# Patient Record
Sex: Male | Born: 1965
Health system: Southern US, Community
[De-identification: ages and names within clinical notes are randomized; demographics above are authoritative.]

## PROBLEM LIST (undated history)

## (undated) DIAGNOSIS — I255 Ischemic cardiomyopathy: Secondary | ICD-10-CM

## (undated) DIAGNOSIS — I10 Essential (primary) hypertension: Secondary | ICD-10-CM

## (undated) DIAGNOSIS — K219 Gastro-esophageal reflux disease without esophagitis: Secondary | ICD-10-CM

## (undated) DIAGNOSIS — R001 Bradycardia, unspecified: Secondary | ICD-10-CM

## (undated) DIAGNOSIS — F319 Bipolar disorder, unspecified: Secondary | ICD-10-CM

## (undated) DIAGNOSIS — J45909 Unspecified asthma, uncomplicated: Secondary | ICD-10-CM

## (undated) DIAGNOSIS — E782 Mixed hyperlipidemia: Secondary | ICD-10-CM

## (undated) DIAGNOSIS — G8929 Other chronic pain: Secondary | ICD-10-CM

## (undated) DIAGNOSIS — M549 Dorsalgia, unspecified: Secondary | ICD-10-CM

## (undated) DIAGNOSIS — I2510 Atherosclerotic heart disease of native coronary artery without angina pectoris: Secondary | ICD-10-CM

## (undated) DIAGNOSIS — E119 Type 2 diabetes mellitus without complications: Secondary | ICD-10-CM

## (undated) HISTORY — DX: Atherosclerotic heart disease of native coronary artery without angina pectoris: I25.10

## (undated) HISTORY — PX: CORONARY ARTERY BYPASS GRAFT: SHX141

## (undated) HISTORY — DX: Type 2 diabetes mellitus without complications: E11.9

## (undated) HISTORY — DX: Mixed hyperlipidemia: E78.2

## (undated) HISTORY — DX: Bipolar disorder, unspecified: F31.9

## (undated) HISTORY — DX: Other chronic pain: G89.29

## (undated) HISTORY — DX: Gastro-esophageal reflux disease without esophagitis: K21.9

## (undated) HISTORY — PX: CHOLECYSTECTOMY: SHX55

## (undated) HISTORY — DX: Essential (primary) hypertension: I10

## (undated) HISTORY — DX: Dorsalgia, unspecified: M54.9

## (undated) HISTORY — PX: OTHER SURGICAL HISTORY: SHX169

---

## 2002-01-21 ENCOUNTER — Emergency Department (HOSPITAL_COMMUNITY): Admission: EM | Admit: 2002-01-21 | Discharge: 2002-01-21 | Payer: Self-pay | Admitting: Emergency Medicine

## 2002-04-17 ENCOUNTER — Emergency Department (HOSPITAL_COMMUNITY): Admission: EM | Admit: 2002-04-17 | Discharge: 2002-04-17 | Payer: Self-pay | Admitting: Emergency Medicine

## 2002-08-15 ENCOUNTER — Encounter: Payer: Self-pay | Admitting: Thoracic Surgery (Cardiothoracic Vascular Surgery)

## 2002-08-15 ENCOUNTER — Inpatient Hospital Stay (HOSPITAL_COMMUNITY): Admission: EM | Admit: 2002-08-15 | Discharge: 2002-08-19 | Payer: Self-pay | Admitting: Cardiology

## 2002-08-16 ENCOUNTER — Encounter: Payer: Self-pay | Admitting: Thoracic Surgery (Cardiothoracic Vascular Surgery)

## 2002-08-17 ENCOUNTER — Encounter: Payer: Self-pay | Admitting: Thoracic Surgery (Cardiothoracic Vascular Surgery)

## 2003-02-13 ENCOUNTER — Inpatient Hospital Stay (HOSPITAL_COMMUNITY): Admission: EM | Admit: 2003-02-13 | Discharge: 2003-02-16 | Payer: Self-pay | Admitting: Cardiology

## 2003-02-15 ENCOUNTER — Encounter: Payer: Self-pay | Admitting: Cardiology

## 2003-11-16 ENCOUNTER — Emergency Department (HOSPITAL_COMMUNITY): Admission: EM | Admit: 2003-11-16 | Discharge: 2003-11-16 | Payer: Self-pay | Admitting: Emergency Medicine

## 2004-02-07 ENCOUNTER — Emergency Department (HOSPITAL_COMMUNITY): Admission: EM | Admit: 2004-02-07 | Discharge: 2004-02-07 | Payer: Self-pay | Admitting: Emergency Medicine

## 2004-04-18 ENCOUNTER — Inpatient Hospital Stay (HOSPITAL_BASED_OUTPATIENT_CLINIC_OR_DEPARTMENT_OTHER): Admission: RE | Admit: 2004-04-18 | Discharge: 2004-04-18 | Payer: Self-pay | Admitting: Cardiovascular Disease

## 2004-11-03 ENCOUNTER — Ambulatory Visit: Payer: Self-pay | Admitting: Cardiology

## 2005-05-13 ENCOUNTER — Emergency Department (HOSPITAL_COMMUNITY): Admission: EM | Admit: 2005-05-13 | Discharge: 2005-05-14 | Payer: Self-pay | Admitting: *Deleted

## 2005-08-21 ENCOUNTER — Ambulatory Visit: Payer: Self-pay | Admitting: Cardiology

## 2005-09-20 ENCOUNTER — Ambulatory Visit: Payer: Self-pay | Admitting: Cardiology

## 2006-01-09 ENCOUNTER — Ambulatory Visit: Payer: Self-pay | Admitting: Cardiology

## 2006-01-09 ENCOUNTER — Observation Stay (HOSPITAL_COMMUNITY): Admission: EM | Admit: 2006-01-09 | Discharge: 2006-01-09 | Payer: Self-pay | Admitting: Cardiology

## 2006-01-19 ENCOUNTER — Ambulatory Visit: Payer: Self-pay | Admitting: Cardiology

## 2006-01-22 ENCOUNTER — Ambulatory Visit: Payer: Self-pay | Admitting: Cardiology

## 2008-05-19 ENCOUNTER — Ambulatory Visit: Payer: Self-pay | Admitting: Cardiology

## 2008-05-20 ENCOUNTER — Ambulatory Visit: Payer: Self-pay | Admitting: Cardiology

## 2008-05-21 ENCOUNTER — Ambulatory Visit: Payer: Self-pay | Admitting: Cardiovascular Disease

## 2008-05-21 ENCOUNTER — Ambulatory Visit (HOSPITAL_COMMUNITY): Admission: RE | Admit: 2008-05-21 | Discharge: 2008-05-21 | Payer: Self-pay | Admitting: Cardiovascular Disease

## 2008-08-04 ENCOUNTER — Emergency Department (HOSPITAL_COMMUNITY): Admission: EM | Admit: 2008-08-04 | Discharge: 2008-08-04 | Payer: Self-pay | Admitting: Emergency Medicine

## 2008-08-31 ENCOUNTER — Encounter: Payer: Self-pay | Admitting: Cardiology

## 2008-10-31 ENCOUNTER — Encounter: Payer: Self-pay | Admitting: Cardiology

## 2008-11-01 ENCOUNTER — Encounter: Payer: Self-pay | Admitting: Cardiology

## 2008-11-20 ENCOUNTER — Encounter: Payer: Self-pay | Admitting: Cardiology

## 2009-01-06 ENCOUNTER — Encounter: Payer: Self-pay | Admitting: Cardiology

## 2009-03-15 ENCOUNTER — Telehealth: Payer: Self-pay | Admitting: Cardiology

## 2009-03-18 ENCOUNTER — Inpatient Hospital Stay (HOSPITAL_COMMUNITY): Admission: AD | Admit: 2009-03-18 | Discharge: 2009-03-20 | Payer: Self-pay | Admitting: Internal Medicine

## 2009-03-18 ENCOUNTER — Ambulatory Visit: Payer: Self-pay | Admitting: Cardiovascular Disease

## 2009-03-18 ENCOUNTER — Ambulatory Visit: Payer: Self-pay | Admitting: Cardiology

## 2009-03-19 ENCOUNTER — Encounter: Payer: Self-pay | Admitting: Cardiology

## 2009-04-07 ENCOUNTER — Ambulatory Visit: Payer: Self-pay | Admitting: Cardiology

## 2009-05-31 ENCOUNTER — Encounter: Payer: Self-pay | Admitting: Cardiology

## 2009-06-03 ENCOUNTER — Encounter: Payer: Self-pay | Admitting: Cardiology

## 2009-06-17 DIAGNOSIS — E782 Mixed hyperlipidemia: Secondary | ICD-10-CM

## 2009-06-28 ENCOUNTER — Encounter: Payer: Self-pay | Admitting: Cardiology

## 2009-07-29 ENCOUNTER — Ambulatory Visit: Payer: Self-pay | Admitting: Cardiology

## 2009-07-29 ENCOUNTER — Encounter: Payer: Self-pay | Admitting: Cardiology

## 2009-07-30 ENCOUNTER — Encounter: Payer: Self-pay | Admitting: Cardiology

## 2009-08-10 ENCOUNTER — Ambulatory Visit: Payer: Self-pay | Admitting: Cardiology

## 2009-08-10 DIAGNOSIS — F17200 Nicotine dependence, unspecified, uncomplicated: Secondary | ICD-10-CM

## 2009-08-13 ENCOUNTER — Emergency Department (HOSPITAL_COMMUNITY): Admission: EM | Admit: 2009-08-13 | Discharge: 2009-08-13 | Payer: Self-pay | Admitting: Emergency Medicine

## 2009-08-25 ENCOUNTER — Telehealth (INDEPENDENT_AMBULATORY_CARE_PROVIDER_SITE_OTHER): Payer: Self-pay | Admitting: *Deleted

## 2009-11-16 ENCOUNTER — Telehealth (INDEPENDENT_AMBULATORY_CARE_PROVIDER_SITE_OTHER): Payer: Self-pay | Admitting: *Deleted

## 2009-11-17 ENCOUNTER — Ambulatory Visit: Payer: Self-pay | Admitting: Cardiology

## 2009-11-22 ENCOUNTER — Encounter (INDEPENDENT_AMBULATORY_CARE_PROVIDER_SITE_OTHER): Payer: Self-pay | Admitting: *Deleted

## 2009-11-22 ENCOUNTER — Encounter: Payer: Self-pay | Admitting: Cardiology

## 2009-11-22 ENCOUNTER — Telehealth (INDEPENDENT_AMBULATORY_CARE_PROVIDER_SITE_OTHER): Payer: Self-pay | Admitting: *Deleted

## 2009-11-23 ENCOUNTER — Encounter: Payer: Self-pay | Admitting: Cardiology

## 2009-11-23 ENCOUNTER — Encounter (INDEPENDENT_AMBULATORY_CARE_PROVIDER_SITE_OTHER): Payer: Self-pay | Admitting: *Deleted

## 2009-11-24 ENCOUNTER — Ambulatory Visit: Payer: Self-pay | Admitting: Cardiovascular Disease

## 2009-11-24 ENCOUNTER — Inpatient Hospital Stay (HOSPITAL_COMMUNITY): Admission: RE | Admit: 2009-11-24 | Discharge: 2009-11-25 | Payer: Self-pay | Admitting: Cardiovascular Disease

## 2009-11-24 ENCOUNTER — Inpatient Hospital Stay (HOSPITAL_BASED_OUTPATIENT_CLINIC_OR_DEPARTMENT_OTHER): Admission: RE | Admit: 2009-11-24 | Discharge: 2009-11-24 | Payer: Self-pay | Admitting: Cardiovascular Disease

## 2009-11-25 ENCOUNTER — Encounter: Payer: Self-pay | Admitting: Cardiovascular Disease

## 2009-12-15 ENCOUNTER — Ambulatory Visit: Payer: Self-pay | Admitting: Cardiology

## 2009-12-21 ENCOUNTER — Ambulatory Visit: Payer: Self-pay | Admitting: Cardiology

## 2009-12-22 ENCOUNTER — Encounter: Payer: Self-pay | Admitting: Cardiology

## 2010-01-24 ENCOUNTER — Encounter: Payer: Self-pay | Admitting: Cardiology

## 2010-01-27 ENCOUNTER — Telehealth (INDEPENDENT_AMBULATORY_CARE_PROVIDER_SITE_OTHER): Payer: Self-pay | Admitting: *Deleted

## 2010-01-27 ENCOUNTER — Encounter: Payer: Self-pay | Admitting: Cardiology

## 2010-01-28 ENCOUNTER — Encounter: Payer: Self-pay | Admitting: Cardiology

## 2010-01-28 ENCOUNTER — Inpatient Hospital Stay (HOSPITAL_COMMUNITY): Admission: EM | Admit: 2010-01-28 | Discharge: 2010-01-30 | Payer: Self-pay | Admitting: Cardiology

## 2010-01-28 ENCOUNTER — Ambulatory Visit: Payer: Self-pay | Admitting: Cardiology

## 2010-01-29 ENCOUNTER — Encounter: Payer: Self-pay | Admitting: Cardiology

## 2010-02-16 ENCOUNTER — Ambulatory Visit: Payer: Self-pay | Admitting: Cardiology

## 2010-02-16 DIAGNOSIS — R9431 Abnormal electrocardiogram [ECG] [EKG]: Secondary | ICD-10-CM

## 2010-04-07 ENCOUNTER — Telehealth (INDEPENDENT_AMBULATORY_CARE_PROVIDER_SITE_OTHER): Payer: Self-pay | Admitting: *Deleted

## 2010-04-11 ENCOUNTER — Inpatient Hospital Stay (HOSPITAL_COMMUNITY): Admission: EM | Admit: 2010-04-11 | Discharge: 2010-04-13 | Payer: Self-pay | Admitting: Internal Medicine

## 2010-04-11 ENCOUNTER — Ambulatory Visit: Payer: Self-pay | Admitting: Internal Medicine

## 2010-04-12 ENCOUNTER — Encounter: Payer: Self-pay | Admitting: Cardiology

## 2010-04-13 ENCOUNTER — Encounter: Payer: Self-pay | Admitting: Cardiology

## 2010-04-20 ENCOUNTER — Emergency Department (HOSPITAL_COMMUNITY): Admission: EM | Admit: 2010-04-20 | Discharge: 2010-04-20 | Payer: Self-pay | Admitting: Emergency Medicine

## 2010-04-28 ENCOUNTER — Encounter: Payer: Self-pay | Admitting: Physician Assistant

## 2010-04-28 ENCOUNTER — Ambulatory Visit: Payer: Self-pay | Admitting: Cardiology

## 2010-06-17 ENCOUNTER — Telehealth (INDEPENDENT_AMBULATORY_CARE_PROVIDER_SITE_OTHER): Payer: Self-pay | Admitting: *Deleted

## 2010-08-11 ENCOUNTER — Ambulatory Visit: Payer: Self-pay | Admitting: Cardiology

## 2010-08-26 ENCOUNTER — Encounter: Payer: Self-pay | Admitting: Cardiology

## 2010-08-29 ENCOUNTER — Encounter: Payer: Self-pay | Admitting: Cardiology

## 2010-08-30 ENCOUNTER — Telehealth (INDEPENDENT_AMBULATORY_CARE_PROVIDER_SITE_OTHER): Payer: Self-pay | Admitting: *Deleted

## 2010-11-08 NOTE — Progress Notes (Signed)
Summary: PLEASE CALL/? R/E EFFIENT   Phone Note Call from Patient Call back at Home Phone [phone]   Caller: Patient Call For: Nurse Summary of Call: patient called concerned that since he is now getting his [facility] that there may be a problem with insurance covering the medication and if so,should he switch back to plavix. Nurse advised patient that if there is a problem with insurance covering medications that they should contact us directly so we can address the problem. Patient verbalized understanding of plan. Initial call taken by: Carlye Grippe,  June 17, 2010 11:46 AM

## 2010-11-08 NOTE — Assessment & Plan Note (Signed)
Summary: EPH-POST PCI [facility]E   Primary Provider:  Dr. Linna Darner   History of Present Illness: the patient is a 45 year old male with a history of numbness dilatation microinfarction recently. He has extensive coronary disease and multiple prior PCI procedures for coronary bypass surgery in 2003. The patient continues to smoke. He presented back with typical unstable angina and ruled in for non-ST elevation myocardial infarction. Since he only underwent cardiac catheterization and was found to have severe recurrent stenosis of the saphenous vein graft to the right coronary artery. The patient had successful percutaneous greater patient using 2 separate drug-eluting stents. The patient did have severe 2 vessel native coronary disease, but patent LIMA graft to the LAD and saphenous vein graft sequential fashion to the diagonal marginal branch. He has normal LV function. The patient was changed to prasugrel based on P2Y12 platelet inhibition testing.  The patient denies intercurrent chest pain. He denies any orthopnea PND. He has no complications related to his cardiac catheterization. He has shown interest to discontinue smoking.  Preventive Screening-Counseling & Management  Alcohol-Tobacco     Smoking Status: current  Comments: trying to quit, has questions about electronic cig - was told it had antifreeze in it ???  Current Medications (verified): 1)  Protonix 40 Mg Solr (Pantoprazole Sodium) .... Take 1 Tablet By Mouth Once A Day. Obtain Further Refills From Primary Md 2)  Lisinopril 2.5 Mg Tabs (Lisinopril) .... Take One Tablet By Mouth Daily 3)  Simvastatin 40 Mg Tabs (Simvastatin) .... Take One Tablet By Mouth Daily At Bedtime 4)  Metoprolol Succinate 50 Mg Xr24h-Tab (Metoprolol Succinate) .... Take 1 Tablet By Mouth Once A Day 5)  Aspirin Ec 325 Mg Tbec (Aspirin) .... Take One Tablet By Mouth Daily 6)  Diazepam 5 Mg Tabs (Diazepam) .... Take 1 Tablet By Mouth Two Times A Day 7)  Nitrostat  0.6 Mg Subl (Nitroglycerin) .... Use As Directed 8)  Isosorbide Mononitrate Cr 60 Mg Xr24h-Tab (Isosorbide Mononitrate) .... Take 1 1/2 Tabs (90mg ) Daily (Place On File) 9)  Ranexa 500 Mg Xr12h-Tab (Ranolazine) .... Take 1 Tablet By Mouth Two Times A Day 10)  Effient 10 Mg Tabs (Prasugrel Hcl) .... Take 1 Tablet By Mouth Once A Day  Allergies (verified): No Known Drug Allergies   Past History:  Past Medical History: Last updated: 08/10/2009 CAD, NATIVE VESSEL (ICD-414.01)  (severe native vessel disease with LAD occlusion, circumflex occlusion, and right coronary occlusion.  A LIMA to the LAD was patent. Although there was diffuse nonobstructive disease distal to the anastomosis.  The saphenous vein graft to the first diagonal ramus intermediate an OM was patent.  The saphenous vein graft to the distal right coronary artery was patent with an 80% defect in the mid segment and a 95% distal stenosis within the graft.  The EF was 55%.  The patient subsequently had 2 bare-metal stents placed to the saphenous vein graft.) HYPERLIPIDEMIA-MIXED (ICD-272.4) COPD Bipolar disorder GERD Gout  Past Surgical History: Last updated: 06/17/2009 Cholecystectomy  Family History: Last updated: 06/17/2009 Family History of CVA or Stroke:   Social History: Last updated: 06/17/2009 Married  Tobacco Use - Yes.  Alcohol Use - yes Drug Use - yes - still uses marijuana but has DC cocaine use  Risk Factors: Smoking Status: current (02/16/2010) Packs/Day: 1/2 PPD (12/15/2009)  Review of Systems  The patient denies fatigue, malaise, fever, weight gain/loss, vision loss, decreased hearing, hoarseness, chest pain, palpitations, shortness of breath, prolonged cough, wheezing, sleep apnea, coughing up blood,  abdominal pain, blood in stool, nausea, vomiting, diarrhea, heartburn, incontinence, blood in urine, muscle weakness, joint pain, leg swelling, rash, skin lesions, headache, fainting, dizziness,  depression, anxiety, enlarged lymph nodes, easy bruising or bleeding, and environmental allergies.    Vital Signs:  Patient profile:   45 year old male Height:      63 inches Weight:      227.25 pounds Pulse rate:   57 / minute BP sitting:   123 / 77  (left arm)  Vitals Entered By: Hoover Brunette, LPN (Feb 16, 2010 10:24 AM) Is Patient Diabetic? No   Physical Exam  Additional Exam:  General: Well-developed, well-nourished in no distress head: Normocephalic and atraumatic eyes PERRLA/EOMI intact, conjunctiva and lids normal nose: No deformity or lesions mouth normal dentition, normal posterior pharynx neck: Supple, no JVD.  No masses, thyromegaly or abnormal cervical nodes lungs: Normal breath sounds bilaterally without wheezing.  Normal percussion heart: regular rate and rhythm with normal S1 and S2, no S3 or S4.  PMI is normal.  No pathological murmurs abdomen: Normal bowel sounds, abdomen is soft and nontender without masses, organomegaly or hernias noted.  No hepatosplenomegaly musculoskeletal: Back normal, normal gait muscle strength and tone normal pulsus: Pulse is normal in all 4 extremities Extremities: No peripheral pitting edema neurologic: Alert and oriented x 3 skin: Intact without lesions or rashes cervical nodes: No significant adenopathy psychologic: Normal affect    Cardiac Cath  Procedure date:  01/28/2010  Findings:      PROCEDURAL FINDINGS:  Aortic pressure 118/56, left ventricular pressure 118/21.   Left ventriculography is normal.  The LVEF is estimated at 60%.  There is no mitral regurgitation.   ASSESSMENT: 1. Severe recurrent stenosis of the saphenous vein graft to right     coronary artery with successful percutaneous intervention using 2     separate drug-eluting stents. 2. Severe three-vessel native coronary artery disease. 3. Continued patency of left internal mammary artery to left anterior     descending and saphenous vein graft sequenced  to diagonal and     obtuse marginal branches. 4. Normal left ventricular function.   PLAN:  The patient needs to continue on long-term dual antiplatelet therapy and with aspirin and Plavix.  He now has multiple drug-eluting stents in the bypass graft to the distal right coronary artery. Recommend P2Y12 platelet testing and change to Effient i  EKG  Procedure date:  02/16/2010  Findings:      normal sinus rhythm no acute ischemic changes  Impression & Recommendations:  Problem # 1:  CAD, NATIVE VESSEL (ICD-414.01) the patient has had recurrent disease in the saphenous vein graft. He underwent drug-eluting stent placement and was changed to prasugrel. This will be continued lifelong. The following medications were removed from the medication list:    Plavix 75 Mg Tabs (Clopidogrel bisulfate) .Marland Kitchen... Take one tablet by mouth daily His updated medication list for this problem includes:    Lisinopril 2.5 Mg Tabs (Lisinopril) .Marland Kitchen... Take one tablet by mouth daily    Metoprolol Succinate 50 Mg Xr24h-tab (Metoprolol succinate) .Marland Kitchen... Take 1 tablet by mouth once a day    Aspirin Ec 325 Mg Tbec (Aspirin) .Marland Kitchen... Take one tablet by mouth daily    Nitrostat 0.6 Mg Subl (Nitroglycerin) ..... Use as directed    Isosorbide Mononitrate Cr 60 Mg Xr24h-tab (Isosorbide mononitrate) .Marland Kitchen... Take 1 1/2 tabs (90mg ) daily (place on file)    Ranexa 500 Mg Xr12h-tab (Ranolazine) .Marland Kitchen... Take 1 tablet by mouth  two times a day    Effient 10 Mg Tabs (Prasugrel hcl) .Marland Kitchen... Take 1 tablet by mouth once a day  Problem # 2:  HYPERLIPIDEMIA-MIXED (ICD-272.4) continual statin drug therapy. His updated medication list for this problem includes:    Simvastatin 40 Mg Tabs (Simvastatin) .Marland Kitchen... Take one tablet by mouth daily at bedtime  Problem # 3:  TOBACCO USER (ICD-305.1) the patient was again counseled about his tobacco use. We will considerChantix if not successful in discontinuation  Problem # 4:  NONSPECIFIC ABNORMAL  ELECTROCARDIOGRAM (ICD-794.31)  we will obtain a 12-lead electrocardiogram to make sure that there is no QT prolongation on Ranexa. The following medications were removed from the medication list:    Plavix 75 Mg Tabs (Clopidogrel bisulfate) .Marland Kitchen... Take one tablet by mouth daily His updated medication list for this problem includes:    Lisinopril 2.5 Mg Tabs (Lisinopril) .Marland Kitchen... Take one tablet by mouth daily    Metoprolol Succinate 50 Mg Xr24h-tab (Metoprolol succinate) .Marland Kitchen... Take 1 tablet by mouth once a day    Aspirin Ec 325 Mg Tbec (Aspirin) .Marland Kitchen... Take one tablet by mouth daily    Nitrostat 0.6 Mg Subl (Nitroglycerin) ..... Use as directed    Isosorbide Mononitrate Cr 60 Mg Xr24h-tab (Isosorbide mononitrate) .Marland Kitchen... Take 1 1/2 tabs (90mg ) daily (place on file)    Ranexa 500 Mg Xr12h-tab (Ranolazine) .Marland Kitchen... Take 1 tablet by mouth two times a day    Effient 10 Mg Tabs (Prasugrel hcl) .Marland Kitchen... Take 1 tablet by mouth once a day  Orders: EKG w/ Interpretation (93000)  Patient Instructions: 1)  Your physician recommends that you continue on your current medications as directed. Please refer to the Current Medication list given to you today. 2)  Follow up in  6 months

## 2010-11-08 NOTE — Progress Notes (Signed)
Summary: chest discomfort   Phone Note Call from Patient Call back at cell:  [phone]   Summary of Call: States he is having tightness in chest, discomfort over last 3-4 days.  Does notice mostly with exertion.  Has had to take as many has 3-4 in one day thru out the day, like poss. over 5-6 hours.  No symptoms with just sitting.  Not due for follow up till November.   Hoover Brunette, LPN  April 07, 2010 3:52 PM   Follow-up for Phone Call        Is he taking his pasurgrel. Is the pain going away with one NTG? Should probably got to ER if pain longer then 10-15 min.  Follow-up by: Lewayne Bunting, MD, [facility],  April 07, 2010 4:15 PM  Additional Follow-up for Phone Call Additional follow up Details #1::        Patient notified.   States he is taking Pasurgrel and his pain usually does go away within 10-15 minutes.  Advised him to go to ED if last longer.  Patient verbalized understanding.  Additional Follow-up by: Hoover Brunette, LPN,  April 07, 2010 4:28 PM

## 2010-11-08 NOTE — Progress Notes (Signed)
Summary: chest pain   Phone Note Call from Patient   Summary of Call: Home:  [phone] - please call back at this number.  C/O chest pain x last 4-5 days.  Had to take 4 NTG tabs on 4/17.  This morning took one around 4:30 this a.m. and then took another around 12.  Rated around 7/10 at that time, then felt better after few minutes.  Now has no chest pain at all, but states he feels funny feeling in left elbow like elbow locking up.  Did just have stents approx. 3 months ago.  Advised to go to ER for evaluation if symptoms worsen before call back from our office here.  Patient verbalized understanding.  Initial call taken by: Hoover Brunette, LPN,  January 27, 2010 12:48 PM  Follow-up for Phone Call        Patient should go to ER.  Follow-up by: Lewayne Bunting, MD, [facility],  January 27, 2010 1:40 PM  Additional Follow-up for Phone Call Additional follow up Details #1::        Patient notified.    Hoover Brunette, LPN  January 27, 2010 2:11 PM

## 2010-11-08 NOTE — Progress Notes (Signed)
Summary: Continued Chest Tightness  Phone Note Call from Patient Call back at Home Phone [phone] Call back at [phone]   Caller: Patient Summary of Call: Pt called stating he was seen on 2/9 d/t chest tightness w/exertion. He was told to start Imdur 60mg  1/2 tablet x 3 days then increase to 1 tablet daily. He was told to come to office on 2/16 to discuss pain and possible need for cath. Pt states since Wednesday's appt, he has had 14 episodes of exertional chest pain. He describes this as tightness in his chest and discomfort in his (L) elbow. It is usually relieved with 1 NTG and is always with some type of exertion. He states this am he had tightness when he got out of the shower. He states throughout the day on 2/11 he took NTG x2, 2/12 NTG x2, 2/13 NTG x2-3, and 1 NTG today. He has taken his Imdur as instructed. Pt is notified to go to the ER for any worsening pain or problems or pain/tightness not releived by NTG. Pt verbalized understanding. He asked that he be called at home number which is [phone]. Initial call taken by: Cyril Loosen, RN, BSN,  November 22, 2009 2:17 PM  Follow-up for Phone Call        Discussed with Dr. Earnestine Leys who gave v.o. to increase Imdur to 90mg  once daily and schedule JV cath. Pt scheduled for Wed at 9:30 am. Pt is aware to go to [facility] in am to have pre-cath labs/cxr done and then come to office for instructions.  Follow-up by: Cyril Loosen, RN, BSN,  November 22, 2009 2:57 PM    New/Updated Medications: ISOSORBIDE MONONITRATE CR 60 MG XR24H-TAB (ISOSORBIDE MONONITRATE) Take 1 1/2 tablet by mouth once daily. Prescriptions: ISOSORBIDE MONONITRATE CR 60 MG XR24H-TAB (ISOSORBIDE MONONITRATE) Take 1 1/2 tablet by mouth once daily.  #45 x 6   Entered by:   Cyril Loosen, RN, BSN   Authorized by:   Lewayne Bunting, MD, [facility]   Signed by:   Cyril Loosen, RN, BSN on 11/22/2009   Method used:   Electronically to        [facility] [address] (retail)       [address]       [place], [place]  [address]       Ph: [phone] or [phone]       Fax: [phone]   RxID:   [id number]   Appended Document: Continued Chest Tightness Discussed plan would Cyril Loosen RN. Patient will be notified to proceed with cardiac catheterization. I discussed risks and benefits of the procedure with the patient in the office during his last visit. We will schedule him in the JV lab.the patient was instructed to come to the emergency room if he has resting chest pain.

## 2010-11-08 NOTE — Progress Notes (Signed)
Summary: chest pain  Phone Note Call from Patient   Summary of Call: Pt. c/o chest pain with exertion x last 2-3 days.  States pain is relieved with taking Ntg. x 1 after episode which relieved the pain.  Advised pt. to go immediatelly to ER this p.m. if symtoms occur and/or worsen.  Appt. scheduled for 2/9 at 9:15 with GD.  Patient verbalized understanding.  Initial call taken by: Hoover Brunette, LPN,  November 16, 2009 4:14 PM

## 2010-11-08 NOTE — Assessment & Plan Note (Signed)
Summary: 3 MO FU PER OCT REMINDER-SRS   Visit Type:  Follow-up Primary Provider:  Dr. Linna Darner   History of Present Illness: He recently presented to [facility] with non-ST elevation MI, status post CABG in 2003. Most recently, he underwent drug-eluting stenting of the SVG-RCA graft in April, secondary to severe recurrent stenosis. He had otherwise patent LIMA graft and SVG-diagonal marginal branch graft, and normal LVEF. Marland KitchenHe is followed here in our clinic by Dr. Andee Lineman.  During this presentation, he was found to have progressive disease of the SVG-RCA graft, with a new lesion involving the native vessel. He was treated successfully with a drug-eluting stent through the previously placed stent, with no noted complications. There was continued patency of both the LIMA-LAD and SVG-first diagonal-ramus-obtuse marginal branch grafts. Mild LVD (EF 45-50%), with inferobasal HA/AK.   The patient stated he still takes occasional nitroglycerin. Overall he's been feeling better however. He is able to mow his lawn. He also stopped using marijuana.  Preventive Screening-Counseling & Management  Alcohol-Tobacco     Smoking Status: current     Smoking Cessation Counseling: yes     Packs/Day: 1 PPD  Current Medications (verified): 1)  Protonix 40 Mg Solr (Pantoprazole Sodium) .... Take 1 Tablet By Mouth Once A Day. Obtain Further Refills From Primary Md 2)  Lisinopril 2.5 Mg Tabs (Lisinopril) .... Take One Tablet By Mouth Daily 3)  Pravastatin Sodium 40 Mg Tabs (Pravastatin Sodium) .... Take 1 Tablet By Mouth Once A Day 4)  Metoprolol Succinate 50 Mg Xr24h-Tab (Metoprolol Succinate) .... Take 1 &1/2 Tablet By Mouth Once A Day 5)  Aspirin Ec 325 Mg Tbec (Aspirin) .... Take One Tablet By Mouth Daily 6)  Diazepam 5 Mg Tabs (Diazepam) .... Take 1 Tablet By Mouth Two Times A Day 7)  Nitrostat 0.4 Mg Subl (Nitroglycerin) .... Use As Directed Chest Pain Every 5 Min Up To 3 Doses. If No Relief,proceed To  Ed 8)  Isosorbide Mononitrate Cr 60 Mg Xr24h-Tab (Isosorbide Mononitrate) .... Take 1 1/2 Tabs Every Morning & 1/2 Tab Every Evening 9)  Ranexa 500 Mg Xr12h-Tab (Ranolazine) .... Take 1 Tablet By Mouth Two Times A Day 10)  Effient 10 Mg Tabs (Prasugrel Hcl) .... Take 1 Tablet By Mouth Once A Day  Allergies (verified): No Known Drug Allergies  Comments:  Nurse/Medical Assistant: The patient's medication bottles and allergies were reviewed with the patient and were updated in the Medication and Allergy Lists.  Past History:  Past Medical History: Last updated: 08/10/2009 CAD, NATIVE VESSEL (ICD-414.01)  (severe native vessel disease with LAD occlusion, circumflex occlusion, and right coronary occlusion.  A LIMA to the LAD was patent. Although there was diffuse nonobstructive disease distal to the anastomosis.  The saphenous vein graft to the first diagonal ramus intermediate an OM was patent.  The saphenous vein graft to the distal right coronary artery was patent with an 80% defect in the mid segment and a 95% distal stenosis within the graft.  The EF was 55%.  The patient subsequently had 2 bare-metal stents placed to the saphenous vein graft.) HYPERLIPIDEMIA-MIXED (ICD-272.4) COPD Bipolar disorder GERD Gout  Past Surgical History: Last updated: 06/17/2009 Cholecystectomy  Family History: Last updated: 06/17/2009 Family History of CVA or Stroke:   Social History: Last updated: 06/17/2009 Married  Tobacco Use - Yes.  Alcohol Use - yes Drug Use - yes - still uses marijuana but has DC cocaine use  Risk Factors: Smoking Status: current (08/11/2010) Packs/Day: 1 PPD (  08/11/2010)  Social History: Packs/Day:  1 PPD  Vital Signs:  Patient profile:   45 year old male Height:      63 inches Weight:      225 pounds BMI:     40.00 Pulse rate:   59 / minute BP sitting:   114 / 76  (left arm) Cuff size:   large  Vitals Entered By: Carlye Grippe (August 11, 2010  10:33 AM)  Nutrition Counseling: Patient's BMI is greater than 25 and therefore counseled on weight management options.  Physical Exam  Additional Exam:  GEN:45 year old male, obese, sitting upright, no distress HEENT: NCAT,PERRLA,EOMI NECK: palpable pulses, no bruits; no JVD; no TM LUNGS: CTA bilaterally HEART: RRR (S1S2); no significant murmurs; no rubs; no gallops ABD: soft, NT; intact BS QMV:HQIO groin stable with palpable pulse; no ecchymosis, hematoma, or bruit; no edema SKIN: warm, dry MUSC: no obvious deformity NEURO: A/O (x3)     Impression & Recommendations:  Problem # 1:  CAD, NATIVE VESSEL (ICD-414.01) details as outlined above. Given the fact that he still has chronic stable angina we will increase his isosorbide mononitrate to one and a half tablet p.o. q. daily His updated medication list for this problem includes:    Lisinopril 2.5 Mg Tabs (Lisinopril) .Marland Kitchen... Take one tablet by mouth daily    Metoprolol Succinate 50 Mg Xr24h-tab (Metoprolol succinate) .Marland Kitchen... Take 1 &1/2 tablet by mouth once a day    Aspirin Ec 325 Mg Tbec (Aspirin) .Marland Kitchen... Take one tablet by mouth daily    Nitrostat 0.4 Mg Subl (Nitroglycerin) ..... Use as directed chest pain every 5 min up to 3 doses. if no relief,proceed to ed    Isosorbide Mononitrate Cr 60 Mg Xr24h-tab (Isosorbide mononitrate) .Marland Kitchen... Take 1 1/2 tabs every morning & 1/2 tab every evening    Ranexa 500 Mg Xr12h-tab (Ranolazine) .Marland Kitchen... Take 1 tablet by mouth two times a day    Effient 10 Mg Tabs (Prasugrel hcl) .Marland Kitchen... Take 1 tablet by mouth once a day  Problem # 2:  HYPERLIPIDEMIA-MIXED (ICD-272.4) continue statin drug therapy His updated medication list for this problem includes:    Pravastatin Sodium 40 Mg Tabs (Pravastatin sodium) .Marland Kitchen... Take 1 tablet by mouth once a day  Problem # 3:  TOBACCO USER (ICD-305.1) the patient stop using marijuana.  Patient Instructions: 1)  Increase Imdur to 1 1/2 tabs every morning & 1/2 tab every  evening 2)  Follow up in  6 months Prescriptions: METOPROLOL SUCCINATE 50 MG XR24H-TAB (METOPROLOL SUCCINATE) Take 1 &1/2 tablet by mouth once a day  #135 x 3   Entered by:   Hoover Brunette, LPN   Authorized by:   Lewayne Bunting, MD, [facility]   Signed by:   Hoover Brunette, LPN on 96/29/5284   Method used:   Faxed to ...       [facility] (mail-order)       [address] [place], [place]  [address]       Ph: [phone]       Fax: [phone]   RxID:   [id number] EFFIENT 10 MG TABS (PRASUGREL HCL) Take 1 tablet by mouth once a day  #90 x 3   Entered by:   Hoover Brunette, LPN   Authorized by:   Lewayne Bunting, MD, [facility]   Signed by:   Hoover Brunette, LPN on 41/66/0630   Method used:   Faxed to .Marland KitchenMarland Kitchen  [facility] (mail-order)       [address] [place], [place]  [address]       Ph: [phone]       Fax: [phone]   RxID:   [id number] LISINOPRIL 2.5 MG TABS (LISINOPRIL) Take one tablet by mouth daily  #90 x 3   Entered by:   Hoover Brunette, LPN   Authorized by:   Lewayne Bunting, MD, [facility]   Signed by:   Hoover Brunette, LPN on 84/13/2440   Method used:   Faxed to ...       [facility] (mail-order)       [address] [place], [place]  [address]       Ph: [phone]       Fax: [phone]   RxID:   [id number] ISOSORBIDE MONONITRATE CR 60 MG XR24H-TAB (ISOSORBIDE MONONITRATE) take 1 1/2 tabs every morning & 1/2 tab every evening  #180 x 3   Entered by:   Hoover Brunette, LPN   Authorized by:   Lewayne Bunting, MD, [facility]   Signed by:   Hoover Brunette, LPN on 30/16/0109   Method used:   Faxed to ...       [facility] (mail-order)       [address] [place], [place]  [address]       Ph: [phone]       Fax: [phone]   RxID:   [id number]

## 2010-11-08 NOTE — Assessment & Plan Note (Signed)
Summary: EPH-POST [facility] POST MI   Visit Type:  hospital follow-up Primary Provider:  Dr. Linna Darner   History of Present Illness: patient presents for post hospital followup.  He recently presented to [facility] with non-ST elevation MI, status post CABG in 2003. Most recently, he underwent drug-eluting stenting of the SVG-RCA graft in April, secondary to severe recurrent stenosis. He had otherwise patent LIMA graft and SVG-diagonal marginal branch graft, and normal LVEF. Marland KitchenHe is followed here in our clinic by Dr. Andee Lineman.  During this presentation, he was found to have progressive disease of the SVG-RCA graft, with a new lesion involving the native vessel. He was treated successfully with a drug-eluting stent through the previously placed stent, with no noted complications. There was continued patency of both the LIMA-LAD and SVG-first diagonal-ramus-obtuse marginal branch grafts. Mild LVD (EF 45-50%), with inferobasal HA/AK.  Clinically, he denies any recurrent chest pain. Unfortunately, he continues to smoke, but at a reduced amount of one half pack a day. He reports compliance with his medications. He denies complications of the left groin incision site.     Preventive Screening-Counseling & Management  Alcohol-Tobacco     Smoking Status: current     Smoking Cessation Counseling: yes     Packs/Day: 1/2 PPD  Current Medications (verified): 1)  Protonix 40 Mg Solr (Pantoprazole Sodium) .... Take 1 Tablet By Mouth Once A Day. Obtain Further Refills From Primary Md 2)  Lisinopril 2.5 Mg Tabs (Lisinopril) .... Take One Tablet By Mouth Daily 3)  Pravastatin Sodium 40 Mg Tabs (Pravastatin Sodium) .... Take 1 Tablet By Mouth Once A Day 4)  Metoprolol Succinate 50 Mg Xr24h-Tab (Metoprolol Succinate) .... Take 1 Tablet By Mouth Once A Day 5)  Aspirin Ec 325 Mg Tbec (Aspirin) .... Take One Tablet By Mouth Daily 6)  Diazepam 5 Mg Tabs (Diazepam) .... Take 1 Tablet By Mouth Two Times A  Day 7)  Nitrostat 0.4 Mg Subl (Nitroglycerin) .... Use As Directed Chest Pain Every 5 Min Up To 3 Doses. If No Relief,proceed To Ed 8)  Isosorbide Mononitrate Cr 60 Mg Xr24h-Tab (Isosorbide Mononitrate) .... Take 1 By Mouth in Morning and 1/2 At Bedtime 9)  Ranexa 500 Mg Xr12h-Tab (Ranolazine) .... Take 1 Tablet By Mouth Two Times A Day 10)  Effient 10 Mg Tabs (Prasugrel Hcl) .... Take 1 Tablet By Mouth Once A Day  Allergies (verified): No Known Drug Allergies  Comments:  Nurse/Medical Assistant: The patient's medication list and allergies were reviewed with the patient and were updated in the Medication and Allergy Lists.  Past History:  Past Medical History: Last updated: 08/10/2009 CAD, NATIVE VESSEL (ICD-414.01)  (severe native vessel disease with LAD occlusion, circumflex occlusion, and right coronary occlusion.  A LIMA to the LAD was patent. Although there was diffuse nonobstructive disease distal to the anastomosis.  The saphenous vein graft to the first diagonal ramus intermediate an OM was patent.  The saphenous vein graft to the distal right coronary artery was patent with an 80% defect in the mid segment and a 95% distal stenosis within the graft.  The EF was 55%.  The patient subsequently had 2 bare-metal stents placed to the saphenous vein graft.) HYPERLIPIDEMIA-MIXED (ICD-272.4) COPD Bipolar disorder GERD Gout  Review of Systems       No fevers, chills, hemoptysis, dysphagia, melena, hematocheezia, hematuria, rash, claudication, orthopnea, pnd, pedal edema. All other systems negative.   Vital Signs:  Patient profile:   45 year old male Height:  63 inches Weight:      211 pounds Pulse rate:   70 / minute BP sitting:   116 / 76  (left arm) Cuff size:   large  Vitals Entered By: Carlye Grippe (April 28, 2010 1:07 PM)  Physical Exam  Additional Exam:  GEN:45 year old male, obese, sitting upright, no distress HEENT: NCAT,PERRLA,EOMI NECK: palpable  pulses, no bruits; no JVD; no TM LUNGS: CTA bilaterally HEART: RRR (S1S2); no significant murmurs; no rubs; no gallops ABD: soft, NT; intact BS ZOX:WRUE groin stable with palpable pulse; no ecchymosis, hematoma, or bruit; no edema SKIN: warm, dry MUSC: no obvious deformity NEURO: A/O (x3)     EKG  Procedure date:  04/28/2010  Findings:      normal sinus rhythm at 69 bpm; normal axis; low voltage, with no acute changes.  Impression & Recommendations:  Problem # 1:  CAD, NATIVE VESSEL (ICD-414.01)  patient is status post recurrent non-ST elevation myocardial infarction, treated with drug-eluting stenting of the SVG-RCA graft secondary to a new lesion in the native vessel. The graft itself was actually patent, with approximately 30-40% in-stent restenosis. His other grafts are patent, as well. There is mild LVD (EF 45-50%). Patient is not reporting any similar chest discomfort, reports compliance with his medications, but unfortunately continues to smoke. He is to continue on his current regimen, which includes Effient, and we will have him return to the clinic in 3 months.  Problem # 2:  HYPERLIPIDEMIA-MIXED (ICD-272.4)  patient is now on pravastatin. He was taken off simvastatin while at [facility], given that he was on Ranexa. A lipid profile there indicated excellent LDL control at 46, but low HDL 36. Total cholesterol 155. We'll continue current dose, maintaining target LDL of 70 or less, if feasible.  Problem # 3:  TOBACCO USER (ICD-305.1)  patient counseled regarding smoking cessation. He appeared to understand, and we'll continue to try on his own.  Other Orders: EKG w/ Interpretation (93000)  Patient Instructions: 1)  Your physician recommends that you continue on your current medications as directed. Please refer to the Current Medication list given to you today. 2)  Follow up in  3 months

## 2010-11-08 NOTE — Letter (Signed)
Summary: Cardiac Cath Instructions - [facility]  [facility] [address]   [place], [place] [address]   Phone: [phone]  Fax: [phone]     11/22/2009 MRN: [id number]  [facility] [address] [place], [place]  [address]  Dear Mr. Stowers,   You are scheduled for a Cardiac Catheterization on Wednesday, November 24, 2009 with Dr. Clifton James.  Please arrive to the 1st floor of the Heart and Vascular Center at [facility] at 0930 am on the day of your procedure. Please do not arrive before 6:30 a.m. Call the Heart and Vascular Center at [phone] if you are unable to make your appointmnet. The Code to get into the parking garage under the building is 0900. Take the elevators to the 1st floor. You must have someone to drive you home. Someone must be with you for the first 24 hours after you arrive home. Please wear clothes that are easy to get on and off and wear slip-on shoes. Do not eat or drink after midnight except water with your medications that morning. Bring all your medications and current insurance cards with you.  _X__ DO NOT take these medications before your procedure: Lisinopril (am of cath)  _X__ Make sure you take your aspirin and Plavix.  _X__ You may take all of your remaining medications with water that morning.  ___ DO NOT take ANY medications before your procedure.  ___ Pre-med instructions:  ________________________________________________________________________  The usual length of stay after your procedure is 2 to 3 hours. This can vary.  If you have any questions, please call the office at the number listed above.  Cyril Loosen, RN, BSN             Directions to the [facility]  Please Note : Park in [place] under the building not the parking deck.  From [employer]: Turn onto [address] Left onto [address] (1st stoplight) Right at the brick entrance to the  hospital ([address]) Bear to the right and you will see a blue sign "Heart and Vascular Center" Parking garage is a sharp right'to get through the gate out in the code ___0900____. Once you park, take the elevator to the first floor. Please do not arrive before 0630am. The building will be dark before that time.   From [address] Turn onto [address] Turn left into the brick entrance to the hospital (Main circle [address]) Bear to the right and you will see a blue sign "Heart and Vascular Center" Parking garage is a sharp right, to get thru the gate put in the code _0900___. Once you park, take the elevator to the first floor. Please do not arrive before 0630am. The building will be dark before that time

## 2010-11-08 NOTE — Assessment & Plan Note (Signed)
Summary: chest pain --agh   Visit Type:  chest pain Primary Provider:  Dr. Linna Darner  CC:  chest pain.  History of Present Illness: patient is a 45 year old male with a history of coronary artery disease status post coronary artery bypass grafting. The patient required 2 bare-metal stents to saphenous vein graft to right coronary artery last year. In the interim he has developed recurrent exertional angina. He describes a discomfort in the chest as a heavy pressure typically in the mornings. This is also associated with pain and pressure in the left elbow. Typically when he goes feet his dogs he develops chest pain and takes nitroglycerin which makes his symptoms better. Last night also had an episode when he went out to feed his dog around 2 AM associated with shortness of breath. He experienced the pain is quite severe but again nitroglycerin resolved the chest pain. Of note is also the patient last week did not take his aspirin as he ran out of this. Unfortunately patient continues to smoke.  Preventive Screening-Counseling & Management  Alcohol-Tobacco     Smoking Status: current     Smoking Cessation Counseling: yes     Packs/Day: 1 PPD  Clinical Review Panels:  CXR CXR results  Normal cardiomediastinal silhouette.  Sternal wire   sutures and mediastinal clips (CABG).  No active pulmonary process   and no pulmonary vascular congestion.    IMPRESSION:   No acute chest findings.  No CHF. (03/18/2009)  Cardiac Imaging Cardiac Cath Findings  1. Severe native three-vessel coronary artery disease.   2. Patent saphenous vein graft sequenced to diagonal1, obtuse marginal       1, and ramus intermedius.   3. Patent left internal mammary artery to left anterior descending       artery.   4. Patent saphenous vein graft to distal right coronary artery with       severe stenosis as outlined above.   5. Successful percutaneous coronary intervention of the saphenous vein       graft to right  coronary artery with 2 bare-metal stents.   6. Preserved left ventricular function.      RECOMMENDATIONS:  The patient will be transferred to the TCU for close   observation.  He should receive dual-antiplatelet therapy with aspirin   and Plavix for a minimum of 1 year in the setting of his non-ST-   elevation MI and diffuse vein graft disease.  We will work with   aggressive risk reduction involving medical therapy, tobacco cessation,   and weight reduction.      Veverly Fells. Excell Seltzer, MD  (03/18/2009)    Current Medications (verified): 1)  Protonix 40 Mg Solr (Pantoprazole Sodium) .... Take 1 Tablet By Mouth Once A Day. Obtain Further Refills From Primary Md 2)  Lisinopril 2.5 Mg Tabs (Lisinopril) .... Take One Tablet By Mouth Daily 3)  Simvastatin 40 Mg Tabs (Simvastatin) .... Take One Tablet By Mouth Daily At Bedtime 4)  Metoprolol Succinate 50 Mg Xr24h-Tab (Metoprolol Succinate) .... Take One Tablet By Mouth Daily 5)  Plavix 75 Mg Tabs (Clopidogrel Bisulfate) .... Take One Tablet By Mouth Daily 6)  Aspirin Ec 325 Mg Tbec (Aspirin) .... Take One Tablet By Mouth Daily 7)  Diazepam 5 Mg Tabs (Diazepam) .... Take 1 Tablet By Mouth Two Times A Day 8)  Nitrostat 0.6 Mg Subl (Nitroglycerin) .... Use As Directed 9)  Isosorbide Mononitrate Cr 60 Mg Xr24h-Tab (Isosorbide Mononitrate) .... Take 1/2 Tab (30mg ) Daily X 3  Days, Then Increase To One Tab (60mg ) Daily  Allergies (verified): No Known Drug Allergies  Comments:  Nurse/Medical Assistant: The patient's medications and allergies were reviewed with the patient and were updated in the Medication and Allergy Lists. Bottles reviewed.  Past History:  Past Medical History: Last updated: 08/10/2009 CAD, NATIVE VESSEL (ICD-414.01)  (severe native vessel disease with LAD occlusion, circumflex occlusion, and right coronary occlusion.  A LIMA to the LAD was patent. Although there was diffuse nonobstructive disease distal to the anastomosis.   The saphenous vein graft to the first diagonal ramus intermediate an OM was patent.  The saphenous vein graft to the distal right coronary artery was patent with an 80% defect in the mid segment and a 95% distal stenosis within the graft.  The EF was 55%.  The patient subsequently had 2 bare-metal stents placed to the saphenous vein graft.) HYPERLIPIDEMIA-MIXED (ICD-272.4) COPD Bipolar disorder GERD Gout  Past Surgical History: Last updated: 06/17/2009 Cholecystectomy  Family History: Last updated: 06/17/2009 Family History of CVA or Stroke:   Social History: Last updated: 06/17/2009 Married  Tobacco Use - Yes.  Alcohol Use - yes Drug Use - yes - still uses marijuana but has DC cocaine use  Risk Factors: Smoking Status: current (11/17/2009) Packs/Day: 1 PPD (11/17/2009)  Social History: Packs/Day:  1 PPD  Review of Systems       The patient complains of chest pain and shortness of breath.  The patient denies fatigue, malaise, fever, weight gain/loss, vision loss, decreased hearing, hoarseness, palpitations, prolonged cough, wheezing, sleep apnea, coughing up blood, abdominal pain, blood in stool, nausea, vomiting, diarrhea, heartburn, incontinence, blood in urine, muscle weakness, joint pain, leg swelling, rash, skin lesions, headache, fainting, dizziness, depression, anxiety, enlarged lymph nodes, easy bruising or bleeding, and environmental allergies.    Vital Signs:  Patient profile:   45 year old male Height:      65 inches Weight:      232 pounds Pulse rate:   71 / minute BP sitting:   110 / 76  (left arm) Cuff size:   large  Vitals Entered By: Carlye Grippe (November 17, 2009 9:20 AM) CC: chest pain   Physical Exam  Additional Exam:  General: Well-developed, well-nourished in no distress head: Normocephalic and atraumatic eyes PERRLA/EOMI intact, conjunctiva and lids normal nose: No deformity or lesions mouth normal dentition, normal posterior  pharynx neck: Supple, no JVD.  No masses, thyromegaly or abnormal cervical nodes lungs: Normal breath sounds bilaterally without wheezing.  Normal percussion heart: regular rate and rhythm with normal S1 and S2, no S3 or S4.  PMI is normal.  No pathological murmurs abdomen: Normal bowel sounds, abdomen is soft and nontender without masses, organomegaly or hernias noted.  No hepatosplenomegaly musculoskeletal: Back normal, normal gait muscle strength and tone normal pulsus: Pulse is normal in all 4 extremities Extremities: No peripheral pitting edema neurologic: Alert and oriented x 3 skin: Intact without lesions or rashes cervical nodes: No significant adenopathy psychologic: Normal affect    Impression & Recommendations:  Problem # 1:  CAD, NATIVE VESSEL (ICD-414.01) the patient is status post recent bare-metal stenting of the saphenous vein graft to right coronary artery. Has recurrent substernal chest pain. His angina is exertional. I will start patient on Imdur initially 30 mg and uptitrated to 60 mg. I also asked him to make sure he takes a daily aspirin. We will reevaluate the patient to begin he still has exertional chest pain we will proceed with  cardiac catheterization. His updated medication list for this problem includes:    Lisinopril 2.5 Mg Tabs (Lisinopril) .Marland Kitchen... Take one tablet by mouth daily    Metoprolol Succinate 50 Mg Xr24h-tab (Metoprolol succinate) .Marland Kitchen... Take one tablet by mouth daily    Plavix 75 Mg Tabs (Clopidogrel bisulfate) .Marland Kitchen... Take one tablet by mouth daily    Aspirin Ec 325 Mg Tbec (Aspirin) .Marland Kitchen... Take one tablet by mouth daily    Nitrostat 0.6 Mg Subl (Nitroglycerin) ..... Use as directed    Isosorbide Mononitrate Cr 60 Mg Xr24h-tab (Isosorbide mononitrate) .Marland Kitchen... Take 1/2 tab (30mg ) daily x 3 days, then increase to one tab (60mg ) daily  Problem # 2:  TOBACCO USER (ICD-305.1) I counseled the patient regarding tobacco use and asked him to  discontinue.  Problem # 3:  HYPERLIPIDEMIA-MIXED (ICD-272.4) the patient will start him drug therapy. His updated medication list for this problem includes:    Simvastatin 40 Mg Tabs (Simvastatin) .Marland Kitchen... Take one tablet by mouth daily at bedtime  Other Orders: EKG w/ Interpretation (93000)  Patient Instructions: 1)  Imdur 60mg  - take 1/2 tab (30mg ) daily x 3 days, then increase to one tab 2)  Aspirin 325mg  daily 3)  Nurse visti in one week to reassess chest pain.  If still severe chest pain, will need JV Cath.   4)  Follow up in  4 weeks. Prescriptions: ISOSORBIDE MONONITRATE CR 60 MG XR24H-TAB (ISOSORBIDE MONONITRATE) take 1/2 tab (30mg ) daily x 3 days, then increase to one tab (60mg ) daily  #30 x 6   Entered by:   Hoover Brunette, LPN   Authorized by:   Lewayne Bunting, MD, [facility]   Signed by:   Hoover Brunette, LPN on 86/57/8469   Method used:   Electronically to        [facility] [address] (retail)       [address]       [place], [place]  [address]       Ph: [phone] or [phone]       Fax: [phone]   RxID:   [id number]

## 2010-11-08 NOTE — Assessment & Plan Note (Signed)
Summary: ekg  Nurse Visit  Comments EKG completed. scanned in for Dr. Tommi Rumps Gent's review     Allergies: No Known Drug Allergies  Orders Added: 1)  EKG w/ Interpretation [93000]

## 2010-11-08 NOTE — Medication Information (Signed)
Summary: RX Folder/ [employer] PRIOR AUTHORIZATION  RX Folder/ [facility] PRIOR AUTHORIZATION   Imported By: Dorise Hiss 09/07/2010 10:19:49  _____________________________________________________________________  External Attachment:    Type:   Image     Comment:   External Document

## 2010-11-08 NOTE — Miscellaneous (Signed)
Summary: Rehab Report/ FAXED CARDIAC REHAB REFERRAL  Rehab Report/ FAXED CARDIAC REHAB REFERRAL   Imported By: Dorise Hiss 12/22/2009 17:14:10  _____________________________________________________________________  External Attachment:    Type:   Image     Comment:   External Document

## 2010-11-08 NOTE — Letter (Signed)
Summary: Engineer, materials at [facility]  [address]   [place], [place] [address]   Phone: [phone]  Fax: [phone]        November 23, 2009 MRN: [id number]    [facility] [address] [place], [place]  [address]    Dear Mr. Andreasen,  Your test ordered by Selena Batten has been reviewed by your physician (or physician assistant) and was found to be normal or stable. Your physician (or physician assistant) felt no changes were needed at this time.  ____ Echocardiogram  ____ Cardiac Stress Test  __X__ Lab Work  ____ Peripheral vascular study of arms, legs or neck  __X__ X-ray  ____ Lung or Breathing test  ____ Other:   Thank you.   Cyril Loosen, RN, BSN    Duane Boston, M.D., F.A.C.C. Thressa Sheller, M.D., F.A.C.C. Oneal Grout, M.D., F.A.C.C. Cheree Ditto, M.D., F.A.C.C. Daiva Nakayama, M.D., F.A.C.C. Kenney Houseman, M.D., F.A.C.C. Jeanne Ivan, PA-C

## 2010-11-08 NOTE — Assessment & Plan Note (Signed)
Summary: 1 MO FU  & post cath=-SRS   Visit Type:  post cath Primary Provider:  Dr. Linna Darner  CC:  post cath.  History of Present Illness: the patient is a 45 year old male with a history of coronary disease, status post coronary bypass grafting. The patient required 2 bare-metal stents to the saphenous vein graft to right coronary artery last year. The patient developed recurrent chest pain earlier this year and was referred for cardiac catheterization on February 16. He was found to have severe in-stent restenosis in a bare-metal stent of the distal body of the saphenous vein graft to posterior descending artery. He said cecally underwent PCI and stent placement with a drug-eluting stent in the area prior bare-metal stent placement. Recommendation were given for lifelong antiplatelet therapy. Unfortunately patient continues to smoke.  The patient initial improvement in his symptoms after stent placement. He now reports again recurrent substernal chest pain. He stated 2 days ago he experienced chest discomfort for approximately an hour. Took approximately for nitroglycerin to relieve his pain and also half a tablet of isosorbide mononitrate. The patient does express a headache with nitroglycerin. He states that he has felt increased shortness of breath on exertion. He has no palpitations or syncope however.  Clinical Review Panels:  CXR CXR results  Normal cardiomediastinal silhouette.  Sternal wire   sutures and mediastinal clips (CABG).  No active pulmonary process   and no pulmonary vascular congestion.    IMPRESSION:   No acute chest findings.  No CHF. (03/18/2009)    Preventive Screening-Counseling & Management  Alcohol-Tobacco     Smoking Status: current     Smoking Cessation Counseling: yes     Packs/Day: 1/2 PPD  Current Medications (verified): 1)  Protonix 40 Mg Solr (Pantoprazole Sodium) .... Take 1 Tablet By Mouth Once A Day. Obtain Further Refills From Primary Md 2)   Lisinopril 2.5 Mg Tabs (Lisinopril) .... Take One Tablet By Mouth Daily 3)  Simvastatin 40 Mg Tabs (Simvastatin) .... Take One Tablet By Mouth Daily At Bedtime 4)  Metoprolol Succinate 50 Mg Xr24h-Tab (Metoprolol Succinate) .... Take 1 1/2 Tabs (75mg ) Daily  (Place On File) 5)  Plavix 75 Mg Tabs (Clopidogrel Bisulfate) .... Take One Tablet By Mouth Daily 6)  Aspirin Ec 325 Mg Tbec (Aspirin) .... Take One Tablet By Mouth Daily 7)  Diazepam 5 Mg Tabs (Diazepam) .... Take 1 Tablet By Mouth Two Times A Day 8)  Nitrostat 0.6 Mg Subl (Nitroglycerin) .... Use As Directed 9)  Isosorbide Mononitrate Cr 60 Mg Xr24h-Tab (Isosorbide Mononitrate) .... Take 1 1/2 Tabs (90mg ) Daily (Place On File) 10)  Ranexa 500 Mg Xr12h-Tab (Ranolazine) .... Take 1 Tablet By Mouth Two Times A Day  Allergies (verified): No Known Drug Allergies  Comments:  Nurse/Medical Assistant: The patient's medications and allergies were reviewed with the patient and were updated in the Medication and Allergy Lists. Bottles reviewed.  Past History:  Past Medical History: Last updated: 08/10/2009 CAD, NATIVE VESSEL (ICD-414.01)  (severe native vessel disease with LAD occlusion, circumflex occlusion, and right coronary occlusion.  A LIMA to the LAD was patent. Although there was diffuse nonobstructive disease distal to the anastomosis.  The saphenous vein graft to the first diagonal ramus intermediate an OM was patent.  The saphenous vein graft to the distal right coronary artery was patent with an 80% defect in the mid segment and a 95% distal stenosis within the graft.  The EF was 55%.  The patient subsequently had 2 bare-metal  stents placed to the saphenous vein graft.) HYPERLIPIDEMIA-MIXED (ICD-272.4) COPD Bipolar disorder GERD Gout  Past Surgical History: Last updated: 06/17/2009 Cholecystectomy  Family History: Last updated: 06/17/2009 Family History of CVA or Stroke:   Social History: Last updated:  06/17/2009 Married  Tobacco Use - Yes.  Alcohol Use - yes Drug Use - yes - still uses marijuana but has DC cocaine use  Risk Factors: Smoking Status: current (12/15/2009) Packs/Day: 1/2 PPD (12/15/2009)  Social History: Packs/Day:  1/2 PPD  Review of Systems       The patient complains of chest pain and shortness of breath.  The patient denies fatigue, malaise, fever, weight gain/loss, vision loss, decreased hearing, hoarseness, palpitations, prolonged cough, wheezing, sleep apnea, coughing up blood, abdominal pain, blood in stool, nausea, vomiting, diarrhea, heartburn, incontinence, blood in urine, muscle weakness, joint pain, leg swelling, rash, skin lesions, headache, fainting, dizziness, depression, anxiety, enlarged lymph nodes, easy bruising or bleeding, and environmental allergies.    Vital Signs:  Patient profile:   45 year old male Height:      65 inches Weight:      231 pounds Pulse rate:   65 / minute BP sitting:   99 / 66  (left arm) Cuff size:   large  Vitals Entered By: Carlye Grippe (December 15, 2009 10:47 AM) CC: post cath   Physical Exam  Additional Exam:  General: Well-developed, well-nourished in no distress head: Normocephalic and atraumatic eyes PERRLA/EOMI intact, conjunctiva and lids normal nose: No deformity or lesions mouth normal dentition, normal posterior pharynx neck: Supple, no JVD.  No masses, thyromegaly or abnormal cervical nodes lungs: Normal breath sounds bilaterally without wheezing.  Normal percussion heart: regular rate and rhythm with normal S1 and S2, no S3 or S4.  PMI is normal.  No pathological murmurs abdomen: Normal bowel sounds, abdomen is soft and nontender without masses, organomegaly or hernias noted.  No hepatosplenomegaly musculoskeletal: Back normal, normal gait muscle strength and tone normal pulsus: Pulse is normal in all 4 extremities Extremities: No peripheral pitting edema neurologic: Alert and oriented x 3 skin:  Intact without lesions or rashes cervical nodes: No significant adenopathy psychologic: Normal affect    Impression & Recommendations:  Problem # 1:  CAD, NATIVE VESSEL (ICD-414.01) the patient is status post prior coronary bypass grafting. He status post 2 bare-metal stents to the saphenous vein graft to the right coronary artery last year. He status post instent restenosis referring placement of a drug-eluting stent to the distal body of the saphenous vein graft to the PDA. He will need lifelong antiplatelet therapy. However he reports recurrent substernal chest pain with Toprol will be increased to 75 and S2 q. daily. Imdur will be placed to 90 mg p.o. q. daily. We will also add Ranexa 500 mg p.o. b.i.d. the patient will have a followup EKG in one week for measurement of his QTC. His updated medication list for this problem includes:    Lisinopril 2.5 Mg Tabs (Lisinopril) .Marland Kitchen... Take one tablet by mouth daily    Metoprolol Succinate 50 Mg Xr24h-tab (Metoprolol succinate) .Marland Kitchen... Take 1 1/2 tabs (75mg ) daily  (place on file)    Plavix 75 Mg Tabs (Clopidogrel bisulfate) .Marland Kitchen... Take one tablet by mouth daily    Aspirin Ec 325 Mg Tbec (Aspirin) .Marland Kitchen... Take one tablet by mouth daily    Nitrostat 0.6 Mg Subl (Nitroglycerin) ..... Use as directed    Isosorbide Mononitrate Cr 60 Mg Xr24h-tab (Isosorbide mononitrate) .Marland Kitchen... Take 1 1/2 tabs (  90mg ) daily (place on file)    Ranexa 500 Mg Xr12h-tab (Ranolazine) .Marland Kitchen... Take 1 tablet by mouth two times a day  Problem # 2:  TOBACCO USER (ICD-305.1) I counseled the patient extensively regarding tobacco use. He has cut down to half a pack a day and is using nicotine gum.  Problem # 3:  HYPERLIPIDEMIA-MIXED (ICD-272.4) the patient is a statin lowering therapy and is compliant with his medical regimen. His updated medication list for this problem includes:    Simvastatin 40 Mg Tabs (Simvastatin) .Marland Kitchen... Take one tablet by mouth daily at bedtime  Patient  Instructions: 1)  Increase Metoprolol XR 75mg  daily 2)  Imdur 90mg  daily 3)  Ranexa 500mg  two times a day  4)  Nurse visit for one week for EKG  5)  Follow up in  1 month Prescriptions: RANEXA 500 MG XR12H-TAB (RANOLAZINE) Take 1 tablet by mouth two times a day  #60 x 6   Entered by:   Hoover Brunette, LPN   Authorized by:   Lewayne Bunting, MD, [facility]   Signed by:   Hoover Brunette, LPN on 78/29/5621   Method used:   Electronically to        [facility] [address] (retail)       [address]       [place], [place]  [address]       Ph: [phone] or [phone]       Fax: [phone]   RxID:   [id number]   Handout requested. ISOSORBIDE MONONITRATE CR 60 MG XR24H-TAB (ISOSORBIDE MONONITRATE) take 1 1/2 tabs (90mg ) daily (place on file)  #45 x 6   Entered by:   Hoover Brunette, LPN   Authorized by:   Lewayne Bunting, MD, [facility]   Signed by:   Hoover Brunette, LPN on 32/95/1884   Method used:   Electronically to        [facility] [address] (retail)       [address]       [place], [place]  [address]       Ph: [phone] or [phone]       Fax: [phone]   RxID:   [id number] METOPROLOL SUCCINATE 50 MG XR24H-TAB (METOPROLOL SUCCINATE) take 1 1/2 tabs (75mg ) daily  (place on file)  #45 x 6   Entered by:   Hoover Brunette, LPN   Authorized by:   Lewayne Bunting, MD, [facility]   Signed by:   Hoover Brunette, LPN on 26/94/8546   Method used:   Electronically to        [facility] [address] (retail)       [address]       [place], [place]  [address]       Ph: [phone] or [phone]       Fax: [phone]   RxID:   [id number]

## 2010-11-08 NOTE — Medication Information (Signed)
Summary: RX Folder/ EFFIENT  RX Folder/ EFFIENT   Imported By: Dorise Hiss 08/29/2010 11:16:22  _____________________________________________________________________  External Attachment:    Type:   Image     Comment:   External Document  Appended Document: RX Folder/ EFFIENT faxed to [facility] pharmacy.

## 2010-11-08 NOTE — Letter (Signed)
Summary: Appointment -missed  [facility] [address]   [place], [place] [address]   Phone: [phone]  Fax: [phone]     January 24, 2010 MRN: [id number]     [facility] [address] [place], [place]  [address]     Dear Mr. [facility],  Our records indicate you missed your appointment on January 24, 2010                        with Dr. Andee Lineman.   It is very important that we reach you to reschedule this appointment. We look forward to participating in your health care needs.   Please contact us at the number listed above at your earliest convenience to reschedule this appointment.   Sincerely,    Glass blower/designer

## 2010-11-08 NOTE — Letter (Signed)
Summary: External Correspondence/ FAXED PRE-CATH ORDER  External Correspondence/ FAXED PRE-CATH ORDER   Imported By: Dorise Hiss 11/23/2009 16:58:07  _____________________________________________________________________  External Attachment:    Type:   Image     Comment:   External Document

## 2010-11-08 NOTE — Progress Notes (Signed)
Summary: PHONE:MEDICATION  Phone Note Call from Patient Call back at Home Phone [phone]   Caller: Patient Summary of Call: [name] called in regards to his Effient 10Mg . States that he has not received this medication. Initial call taken by: Zachary George,  August 30, 2010 2:42 PM  Follow-up for Phone Call        patient informed that rx sent and letter from insurance sent to [facility]. Follow-up by: Carlye Grippe,  August 30, 2010 4:49 PM

## 2010-12-25 LAB — CBC
HCT: 39 % (ref 39.0–52.0)
HCT: 39.2 % (ref 39.0–52.0)
Hemoglobin: 13.2 g/dL (ref 13.0–17.0)
Hemoglobin: 13.5 g/dL (ref 13.0–17.0)
MCV: 102.5 fL — ABNORMAL HIGH (ref 78.0–100.0)
Platelets: 162 10*3/uL (ref 150–400)
RBC: 3.82 MIL/uL — ABNORMAL LOW (ref 4.22–5.81)
RBC: 3.84 MIL/uL — ABNORMAL LOW (ref 4.22–5.81)
RDW: 13.7 % (ref 11.5–15.5)
WBC: 6.9 10*3/uL (ref 4.0–10.5)

## 2010-12-25 LAB — BASIC METABOLIC PANEL
Calcium: 8.6 mg/dL (ref 8.4–10.5)
GFR calc Af Amer: >60 mL/min (ref >60)
GFR calc non Af Amer: >60 mL/min (ref >60)
Glucose, Bld: 99 mg/dL (ref 70–99)
Potassium: 4.5 mEq/L (ref 3.5–5.1)
Sodium: 140 mEq/L (ref 135–145)

## 2010-12-25 LAB — COMPREHENSIVE METABOLIC PANEL
ALT: 41 U/L (ref 0–53)
CO2: 28 mEq/L (ref 19–32)
Calcium: 8.1 mg/dL — ABNORMAL LOW (ref 8.4–10.5)
Chloride: 108 mEq/L (ref 96–112)
Creatinine, Ser: 1.17 mg/dL (ref 0.4–1.5)
GFR calc Af Amer: >60 mL/min (ref >60)
Potassium: 4.3 mEq/L (ref 3.5–5.1)
Sodium: 142 mEq/L (ref 135–145)
Total Bilirubin: 0.4 mg/dL (ref 0.3–1.2)

## 2010-12-25 LAB — PROTIME-INR: INR: 1 (ref 0.00–1.49)

## 2010-12-25 LAB — CARDIAC PANEL(CRET KIN+CKTOT+MB+TROPI)
CK, MB: 12 ng/mL (ref 0.3–4.0)
CK, MB: 7.6 ng/mL (ref 0.3–4.0)
Relative Index: 4.9 — ABNORMAL HIGH (ref 0.0–2.5)
Relative Index: 6.9 — ABNORMAL HIGH (ref 0.0–2.5)

## 2010-12-25 LAB — LIPID PANEL
Cholesterol: 115 mg/dL (ref 0–200)
LDL Cholesterol: 46 mg/dL (ref 0–99)
Total CHOL/HDL Ratio: 3.2 RATIO
Triglycerides: 163 mg/dL — ABNORMAL HIGH (ref <150)
VLDL: 33 mg/dL (ref 0–40)

## 2010-12-25 LAB — HEPARIN LEVEL (UNFRACTIONATED): Heparin Unfractionated: 0.37 IU/mL (ref 0.30–0.70)

## 2010-12-25 LAB — MRSA PCR SCREENING: MRSA by PCR: NEGATIVE

## 2010-12-27 LAB — BASIC METABOLIC PANEL
CO2: 33 mEq/L — ABNORMAL HIGH (ref 19–32)
Calcium: 8.3 mg/dL — ABNORMAL LOW (ref 8.4–10.5)
Chloride: 103 mEq/L (ref 96–112)
Creatinine, Ser: 0.9 mg/dL (ref 0.4–1.5)
GFR calc Af Amer: >60 mL/min (ref >60)
GFR calc Af Amer: >60 mL/min (ref >60)
GFR calc non Af Amer: >60 mL/min (ref >60)
GFR calc non Af Amer: >60 mL/min (ref >60)
Potassium: 4.4 mEq/L (ref 3.5–5.1)
Sodium: 140 mEq/L (ref 135–145)

## 2010-12-27 LAB — PLATELET INHIBITION P2Y12
Platelet Function  P2Y12: 298 [PRU] (ref 194–418)
Platelet Function Baseline: 346 [PRU] (ref 194–418)

## 2010-12-27 LAB — CBC
Hemoglobin: 14.2 g/dL (ref 13.0–17.0)
MCHC: 34.3 g/dL (ref 30.0–36.0)
MCHC: 34.5 g/dL (ref 30.0–36.0)
Platelets: 176 10*3/uL (ref 150–400)
RBC: 3.56 MIL/uL — ABNORMAL LOW (ref 4.22–5.81)
RDW: 14.4 % (ref 11.5–15.5)
RDW: 14.5 % (ref 11.5–15.5)

## 2010-12-27 LAB — TSH: TSH: 2.555 u[IU]/mL (ref 0.350–4.500)

## 2010-12-27 LAB — CARDIAC PANEL(CRET KIN+CKTOT+MB+TROPI)
CK, MB: 1.1 ng/mL (ref 0.3–4.0)
CK, MB: 1.7 ng/mL (ref 0.3–4.0)
Relative Index: 1.3 (ref 0.0–2.5)
Relative Index: INVALID (ref 0.0–2.5)
Total CK: 130 U/L (ref 7–232)
Total CK: 135 U/L (ref 7–232)
Troponin I: 0.06 ng/mL (ref 0.00–0.06)
Troponin I: 0.07 ng/mL — ABNORMAL HIGH (ref 0.00–0.06)

## 2010-12-27 LAB — MRSA PCR SCREENING: MRSA by PCR: NEGATIVE

## 2010-12-27 LAB — PROTIME-INR: Prothrombin Time: 12.9 seconds (ref 11.6–15.2)

## 2010-12-27 LAB — LIPASE, BLOOD: Lipase: 22 U/L (ref 11–59)

## 2010-12-27 LAB — AMYLASE: Amylase: 45 U/L (ref 0–105)

## 2010-12-28 LAB — BASIC METABOLIC PANEL
BUN: 6 mg/dL (ref 6–23)
Calcium: 9 mg/dL (ref 8.4–10.5)
GFR calc non Af Amer: >60 mL/min (ref >60)
Glucose, Bld: 105 mg/dL — ABNORMAL HIGH (ref 70–99)

## 2010-12-28 LAB — CBC
Platelets: 201 10*3/uL (ref 150–400)
RDW: 13.8 % (ref 11.5–15.5)

## 2011-01-05 ENCOUNTER — Other Ambulatory Visit: Payer: Self-pay | Admitting: *Deleted

## 2011-01-05 MED ORDER — PRAVASTATIN SODIUM 40 MG PO TABS: 40.0000 mg | ORAL_TABLET | Freq: Every day | ORAL | Status: DC

## 2011-01-16 LAB — POCT CARDIAC MARKERS
CKMB, poc: 1.4 ng/mL (ref 1.0–8.0)
CKMB, poc: <1 ng/mL — ABNORMAL LOW (ref 1.0–8.0)
Myoglobin, poc: 37.2 ng/mL (ref 12–200)
Troponin i, poc: 0.17 ng/mL — ABNORMAL HIGH (ref 0.00–0.09)
Troponin i, poc: 0.18 ng/mL — ABNORMAL HIGH (ref 0.00–0.09)

## 2011-01-16 LAB — URINALYSIS, ROUTINE W REFLEX MICROSCOPIC
Glucose, UA: NEGATIVE mg/dL
Nitrite: NEGATIVE
Specific Gravity, Urine: 1.01 (ref 1.005–1.030)
pH: 5.5 (ref 5.0–8.0)

## 2011-01-16 LAB — LIPID PANEL
Cholesterol: 165 mg/dL (ref 0–200)
HDL: 29 mg/dL — ABNORMAL LOW (ref >39)
LDL Cholesterol: 57 mg/dL (ref 0–99)
Total CHOL/HDL Ratio: 5.7 RATIO
VLDL: 79 mg/dL — ABNORMAL HIGH (ref 0–40)

## 2011-01-16 LAB — CBC
HCT: 40.8 % (ref 39.0–52.0)
HCT: 41.3 % (ref 39.0–52.0)
Hemoglobin: 14.2 g/dL (ref 13.0–17.0)
MCHC: 34.3 g/dL (ref 30.0–36.0)
MCHC: 35.7 g/dL (ref 30.0–36.0)
MCV: 98.3 fL (ref 78.0–100.0)
MCV: 98.4 fL (ref 78.0–100.0)
MCV: 98.4 fL (ref 78.0–100.0)
RBC: 4.21 MIL/uL — ABNORMAL LOW (ref 4.22–5.81)
RBC: 4.03 MIL/uL — ABNORMAL LOW (ref 4.22–5.81)
RDW: 13.9 % (ref 11.5–15.5)
RDW: 14.3 % (ref 11.5–15.5)
WBC: 4.9 10*3/uL (ref 4.0–10.5)
WBC: 5.6 10*3/uL (ref 4.0–10.5)

## 2011-01-16 LAB — DIFFERENTIAL
Basophils Absolute: 0.1 10*3/uL (ref 0.0–0.1)
Basophils Relative: 1 % (ref 0–1)
Eosinophils Absolute: 0.2 10*3/uL (ref 0.0–0.7)
Monocytes Absolute: 0.5 10*3/uL (ref 0.1–1.0)
Monocytes Relative: 8 % (ref 3–12)
Neutro Abs: 3.6 10*3/uL (ref 1.7–7.7)
Neutrophils Relative %: 57 % (ref 43–77)

## 2011-01-16 LAB — TROPONIN I: Troponin I: 0.33 ng/mL — ABNORMAL HIGH (ref 0.00–0.06)

## 2011-01-16 LAB — COMPREHENSIVE METABOLIC PANEL
Albumin: 3.6 g/dL (ref 3.5–5.2)
Alkaline Phosphatase: 59 U/L (ref 39–117)
BUN: 7 mg/dL (ref 6–23)
Calcium: 8.4 mg/dL (ref 8.4–10.5)
Potassium: 4.1 mEq/L (ref 3.5–5.1)
Sodium: 139 mEq/L (ref 135–145)
Total Protein: 6.3 g/dL (ref 6.0–8.3)

## 2011-01-16 LAB — CK TOTAL AND CKMB (NOT AT ARMC)
CK, MB: 1.6 ng/mL (ref 0.3–4.0)
CK, MB: 2.7 ng/mL (ref 0.3–4.0)
Relative Index: 1.5 (ref 0.0–2.5)
Relative Index: 2.5 (ref 0.0–2.5)
Total CK: 107 U/L (ref 7–232)
Total CK: 110 U/L (ref 7–232)
Total CK: 116 U/L (ref 7–232)

## 2011-01-16 LAB — BASIC METABOLIC PANEL
CO2: 27 mEq/L (ref 19–32)
CO2: 24 mEq/L (ref 19–32)
Calcium: 8.5 mg/dL (ref 8.4–10.5)
Chloride: 103 mEq/L (ref 96–112)
Chloride: 106 mEq/L (ref 96–112)
Creatinine, Ser: 0.87 mg/dL (ref 0.4–1.5)
GFR calc Af Amer: >60 mL/min (ref >60)
GFR calc Af Amer: >60 mL/min (ref >60)
Glucose, Bld: 143 mg/dL — ABNORMAL HIGH (ref 70–99)
Potassium: 4.3 mEq/L (ref 3.5–5.1)
Sodium: 137 mEq/L (ref 135–145)

## 2011-01-16 LAB — PROTIME-INR: INR: 1 (ref 0.00–1.49)

## 2011-01-16 LAB — RAPID URINE DRUG SCREEN, HOSP PERFORMED: Tetrahydrocannabinol: POSITIVE — AB

## 2011-01-16 LAB — APTT: aPTT: 26 seconds (ref 24–37)

## 2011-01-26 ENCOUNTER — Ambulatory Visit: Payer: Self-pay | Admitting: Cardiology

## 2011-02-21 NOTE — Assessment & Plan Note (Signed)
[facility]                          [facility] OFFICE NOTE   NAME:[name] [name], [name] [name]                     MRN:          [id number]  DATE:05/20/2008                            DOB:          29-May-1966    PRIMARY CARDIOLOGIST:  Dr. Lewayne Bunting.   PRIMARY PHYSICIAN:  Dr. Regina Eck.   REASON FOR VISIT:  [name] is a 45 year old male, with known  multivessel CAD status post CABG in 2003, who presents today following  completion of a dobutamine stress Cardiolite yesterday, which was  abnormal.  The study was ordered by Dr. Regina Eck, with whom the  patient had a regular scheduled followup just last week.  He reported  various symptoms, including intermittent chest discomfort, and was thus  referred for a stress test.   The study was reviewed by Dr. Willa Rough, who noted evidence of  partial reversibility in the anterior wall suggestive of moderate  scar/ischemia.  Calculated EF was 56%.  Notably, there were no  significant EKG changes during dobutamine infusion.  Dr. Myrtis Ser had the  patient added on to clinic today.   Clinically, [name], who has not been seen in our clinic since April  2007, at which time he followed with Roxanne Mins, PA-C, suggests a 3 -  4 month history of intermittent, left-sided chest discomfort which is  not strictly correlated with exertion.  But, he also was most  preoccupied with complaint of recent nausea and vomiting, as well as  occasional diarrhea.  He also has felt weak and lightheaded, but denies  any frank syncope.   When queried as to why he has not presented directly to the emergency  room, he gave a vague and barely audible response.  It is important to  note that [name] has a significant psychiatric history, including  bipolar disorder and depression, as well as noted history of  noncompliance.  It was also noted, when he was discharged from [facility] in 2007, that he was homeless at  that time.  Additionally, Mr.  [name] [name] has a documented history of substance abuse, including several  urine drug screens here at [facility] which have been positive  for marijuana.  In addition to admitting to still using marijuana at  least once a week, he informs me today, however, that he has not used  cocaine in approximately 10 years.   With respect to his chest pain, he states that this can occur either at  rest or with minimal activity, but there is no predictable pattern to  it.  They can also present as a sharp sensation with occasional  radiation down the left arm.  Interestingly, however, he states that he  typically gets this in the evening, while at rest.  He does not suggest  that he has been awakened by chest discomfort.  The patient currently  denies any chest pain.   An EKG in our office today reveals NSR at 60 bpm with chronic ST-segment  depression in leads 1 and AVL, and no acute changes.  This is  essentially unchanged from previous  study in 2006.   ALLERGIES:  No known drug allergies.   CURRENT MEDICATIONS:  1. Caduet 05/20 mg daily.  2. Isosorbide 30 mg daily.  3. Prilosec 40 daily.  4. Metoprolol succinate 50 daily.  5. Alprazolam 0.5 b.i.d.   PAST MEDICAL HISTORY:  1. Multivessel CAD.      a.     Acute, evolving myocardial infarction/urgent five-vessel       CABG, November 2003:  LIMA - LAD; SVG - DX1; RI; OM; SVG - RCA.      b.     Severe three-vessel CAD with patent grafts and preserved LVF       by cardiac catheterization, July 2005.  2. COPD/longstanding tobacco.  3. Psychiatric disorder.      a.     Bipolar disorder, depression.  4. Dyslipidemia.  5. GERD.  6. Obesity.  7. Gout.  8. Noncompliance.  9. Polysubstance abuse.   SOCIAL HISTORY:  The patient is married, has a total of 5 children,  lives here in [place].  He is on total disability.  He smokes at least one  pack a day and started at the age of 24.  Drinks alcohol on  occasion.   FAMILY HISTORY:  Sister age 11, history of stroke.   PHYSICAL EXAMINATION:  VITAL SIGNS:  Blood pressure 125/80, pulse 64,  regular.  Weight 269.  GENERAL:  A 45 year old male, obese, sitting upright, in no distress.  HEENT:  Normocephalic, atraumatic.  NECK:  Palpable carotid pulse without bruits; no JVD.  LUNGS:  Diminished breath sounds at bases, but without crackles or  wheezes.  HEART:  RRR (S1, S2) no significant murmurs.  No rubs or gallops.  ABDOMEN:  Soft, nontender, intact bowel sounds.  EXTREMITIES:  Palpable bilateral femoral pulse without bruits; intact  distal pulses without significant edema.  NEURO:  No focal deficit.   IMPRESSION:  [name] is a 45 year old male, with known history of  severe multivessel coronary artery disease status post emergent CABG in  2003, with patent bypass grafts and normal left ventricular function by  last catheterization in 2005.  He has not been seen in our clinic in  nearly 2-1/2 years, and now presents following an abnormal dobutamine  Cardiolite test, just completed yesterday.   The study was reviewed by Dr. Willa Rough, who noted evidence of  moderate scar/ischemia in the anterior wall and apex; ejection fraction  56%.  There were no significant EKG changes during dobutamine infusion.   Clinically, [name] describes a several-month history of  intermittent chest discomfort, both at rest and with exertion.  He has  not used any nitroglycerin, currently denies any chest discomfort, and  had an EKG in our office that does not show any significant change since  his previous study of nearly 3 years ago.   [name] also admits to ongoing marijuana use, but denies any recent  use of cocaine.  He also continues to smoke heavily, took himself off  aspirin some time ago, and has prior history of noncompliance.  Clearly,  he is at risk for progressive coronary atherosclerosis.   PLAN:  Recommendation is for the  patient to present for an outpatient  cardiac catheterization tomorrow at [facility].  This will be  arranged to take place in our inpatient catheterization lab, owing to  his complex cardiac history and need for possible percutaneous  intervention.  The patient is agreeable with this plan and the  risks/benefits have been  discussed.   Of note, the patient will be started on full-dose aspirin here in our  office.  Labs will be drawn tomorrow morning on arrival, including a  urine drug screen.  The patient is to otherwise continue current  regimen, which consists of long-acting metoprolol, isosorbide, and a  statin.      Rozell Searing, PA-C  Electronically Signed      Jonelle Sidle, MD  Electronically Signed   GS/MedQ  DD: 05/20/2008  DT: 05/20/2008  Job #: [id number]

## 2011-02-21 NOTE — Cardiovascular Report (Signed)
[name], [name]:  [name]   MEDICAL RECORD NO.:  [id number]          PATIENT TYPE:  INP   LOCATION:  2928                         FACILITY:  [facility]   PHYSICIAN:  Veverly Fells. Excell Seltzer, MD  DATE OF BIRTH:  12-31-1965   DATE OF PROCEDURE:  03/18/2009  DATE OF DISCHARGE:                            CARDIAC CATHETERIZATION   PROCEDURES:  Left heart catheterization, selective coronary angiography,  LIMA angiography, saphenous vein graft angiography, LV angiography, PTCA  and stenting of the saphenous vein graft to distal right coronary  artery, and Perclose of the right femoral artery.   PROCEDURAL INDICATION:  [name] [name] is a 45 year old gentleman with  advanced coronary artery disease.  He has had prior bypass surgery.  He  presented with classic symptoms of an acute coronary syndrome and had a  positive troponin at presentation of [facility].  He was  transferred for urgent cath in the setting of ongoing chest pain.  On  arrival here, he was chest pain free, but he required titration of the  nitroglycerin drip en route.   Risks and indications of the procedure were reviewed with the patient.  Informed consent was obtained.  The right groin was prepped and draped  and anesthetized with 1% lidocaine using modified Seldinger technique. A  6-French sheath was placed in the right femoral artery.  Standard 5-  [place] Judkins catheters were used for coronary angiography, saphenous  vein graft, LIMA angiography, and LV angiography.  At the completion of  the diagnostic procedure, I elected to intervene on the saphenous vein  graft to the right coronary artery.  There was critical stenosis in the  distal body of the graft.  There was also severe stenosis in the midbody  of the graft with a filling defect suggestive of thrombus.  There was  diffuse disease throughout the graft that appeared nonobstructive.  The  patient had received subcutaneous Lovenox  approximately 7 hours prior to  the PCI.  He was given an additional 0.3 mg/kg of Lovenox IV.  Integrilin was also used.  Initially, a Proxis proximal embolic  protection device was used.  The sheath was upsized to 7-French.  A 7-  French RCB guide catheter was inserted.  The Proxis was passed into the  proximal body of the vein graft.  A brief test inflation was performed  to see if the patient would tolerate temporary occlusion of the vessel.  Following this test inflation, a cougar guidewire was passed beyond the  areas of disease in the mid and distal body of the graft.  The Proxis  device was inflated and very quick balloon inflations were performed at  the mid and distal body of the graft.  A 2.5 x 15-mm balloon was taken  to 8 atmospheres for 10 seconds in the midportion and 8 atmospheres for  13 seconds in the distal portion.  During the second and balloon  inflation, the patient developed a wide QRS complex and ultimately went  into ventricular tachycardia.  The balloon was quickly removed and the  device was aspirated and then deflated.  He  spontaneously converted back  to sinus rhythm just before he had to be emergently cardioverted.  It  was clear that he was not going to tolerate any further Proxis  inflations for the remainder of the procedure.  Therefore, a spider  distal embolic protection device was deployed in the mid to distal  bifurcation of the right coronary artery beyond the areas of stenosis.  A 4 mm device was chosen.  The device was delivered through the Proxis.  The distal lesion was then stented with a 3.0 x 18 mm Multi-Link Vision  stent, which was deployed at 12 atmospheres.  It appeared well expanded.  The mid body of the graft was then treated with a 3.0 x 18-mm Vision  stent, which was deployed at 14 atmospheres.  At that point,  intracoronary nitroglycerin was given.  There was some residual stenosis  in the proximal body of the graft.  It did not appear  flow limiting.  The retrieval sheath was advanced to retrieve the spider device without  difficulty.  The device was pulled out.  Final angiography demonstrated  a good angiographic result.  There was mild residual stenosis in a  diffuse manner throughout the proximal body of the graft.  There was  also some haziness in the mid to distal body of the graft, but there was  no obstructive disease noted.  The guide catheter was removed and a  Perclose device was used to close the femoral arteriotomy.  The patient  otherwise tolerated the procedure well and he was stable with chest pain  free at the completion of the procedure.   PROCEDURAL FINDINGS:  Left ventricular pressure is 117/19.  Aortic  pressure is 126/84 with a mean of 102.   Coronary angiography:  The native coronary vessels are severely  diseased.  The left mainstem is patent with diffuse disease.  The left  main bifurcates into the LAD and left circumflex.  The LAD is totally  occluded at the first septal perforator.  There is some antegrade flow  into the first diagonal branch, where there is competitive filling from  the graft seen.   Left circumflex:  Left circumflex is totally occluded in the proximal  portion.   Right coronary artery:  The RCA is totally occluded in the proximal  portion.   LIMA angiography:  The left internal mammary is patent.  The left  subclavian artery is patent.  The mammary artery is anastomosed to the  mid-LAD and the mid-LAD has diffuse nonobstructive disease.   Saphenous vein graft angiography:  The saphenous vein graft sequenced to  the first diagonal, ramus intermedius, an OM, is widely patent.  There  is no significant stenosis noted.  The branch vessels are patent as  well.   Saphenous vein graft to distal right coronary artery is patent.  There  is diffuse disease noted.  The midbody of the graft has a filling defect  with an eccentric 80% stenosis.  The distal body of the graft also  has a  filling defect with a critical 95% stenosis.  The right coronary artery  supplies a large territory and gives off a PDA branch as well as 2 large  posterolateral branches.  There is haziness throughout the mid distal  body of the graft.   Left ventriculography:  There is mild hypokinesis of the mid and distal  anterior walls.  The LVEF is preserved at 55%.  There is no significant  mitral regurgitation.   ASSESSMENT:  1. Severe native three-vessel coronary artery disease.  2. Patent saphenous vein graft sequenced to diagonal1, obtuse marginal      1, and ramus intermedius.  3. Patent left internal mammary artery to left anterior descending      artery.  4. Patent saphenous vein graft to distal right coronary artery with      severe stenosis as outlined above.  5. Successful percutaneous coronary intervention of the saphenous vein      graft to right coronary artery with 2 bare-metal stents.  6. Preserved left ventricular function.   RECOMMENDATIONS:  The patient will be transferred to the TCU for close  observation.  He should receive dual-antiplatelet therapy with aspirin  and Plavix for a minimum of 1 year in the setting of his non-ST-  elevation MI and diffuse vein graft disease.  We will work with  aggressive risk reduction involving medical therapy, tobacco cessation,  and weight reduction.      Veverly Fells. Excell Seltzer, MD  Electronically Signed    MDC/MEDQ  D:  03/18/2009  T:  03/19/2009  Job:  [id number]   cc:   Learta Codding, MD,FACC  Jonelle Sidle, MD

## 2011-02-21 NOTE — Assessment & Plan Note (Signed)
[facility]                          [facility] OFFICE NOTE   NAME:[name] [name], [name] [name]                     MRN:          [id number]  DATE:04/07/2009                            DOB:          [birth year]    PRIMARY CARE PHYSICIAN:  Erasmo Downer, MD   REASON FOR PRESENTATION:  Evaluate the patient with status post recent  stenting.   HISTORY OF PRESENT ILLNESS:  The patient was hospitalized on March 18, 2009, with unstable angina and a troponin elevation.  There is a history  of coronary artery disease, status post bypass grafting.  He  subsequently had a cardiac catheterization by Dr. Excell Seltzer.  This  demonstrated severe native vessel disease with LAD occlusion, circumflex  occlusion, and right coronary occlusion.  A LIMA to the LAD was patent.  Although there was diffuse nonobstructive disease distal to the  anastomosis.  The saphenous vein graft to the first diagonal ramus  intermediate an OM was patent.  The saphenous vein graft to the distal  right coronary artery was patent with an 80% defect in the mid segment  and a 95% distal stenosis within the graft.  The EF was 55%.  The  patient subsequently had 2 bare-metal stents placed to the saphenous  vein graft.  He did well with this.  Since going home, he has had none  of the severe chest discomfort, which he had prior to this.  He has had  1 episode of arm discomfort that was fleeting.  He has had no new neck  discomfort.  He has had no new shortness of breath and denies any PND or  orthopnea.  Unfortunately, he is still smoking cigarettes, though he has  cut down.  He is down to 1 pack per day.  He is taking Chantix.   PAST MEDICAL HISTORY:  Coronary artery disease, status post CABG in  November 2003 (anatomy as described above), hyperlipidemia, chronic  obstructive pulmonary disease, tobacco abuse, bipolar disorder,  gastroesophageal reflux disease, gout, status post trauma to his left  leg  with open reduction and internal fixation, and cholecystectomy.   ALLERGIES:  None.   MEDICATIONS:  1. Plavix 75 mg daily.  2. Simvastatin 40 mg daily.  3. Protonix 40 mg daily.  4. Lisinopril 2.5 mg daily.  5. Aspirin 325 mg daily.  6. Xanax 0.5 mg nightly.   REVIEW OF SYSTEMS:  As stated in the HPI, otherwise negative for all  other systems.   PHYSICAL EXAMINATION:  GENERAL:  The patient is in no distress.  VITAL SIGNS:  Blood pressure is 115/72, heart rate is 61 and regular,  weight is 225 pounds.  NECK:  No jugular venous distention at 45 degrees; carotid upstroke  brisk and symmetrical; no bruits, no thyromegaly.  LYMPHATICS:  No lymphadenopathy.  LUNGS:  Clear to auscultation bilaterally.  BACK:  No costovertebral angle tenderness.  CHEST:  Unremarkable.  HEART:  PMI not displaced or sustained; S1 and S2 within normal limits;  no S3, no S4; no clicks, no rubs, and no murmurs.  ABDOMEN:  Obese;  positive bowel sounds;  normal in frequency and pitch; no bruits,  rebound; no guarding, no midline pulsatile mass; no organomegaly.  SKIN:  No rashes, no nodules.  EXTREMITIES:  2+ pulses, no edema.   ASSESSMENT AND PLAN:  1. Coronary artery disease.  The patient has severe coronary artery      disease as described.  He is still not participating aggressively      in risk reduction.  He is taking the Chantix and hopefully can quit      smoking.  This is discussed below.  For now, he will continue his      medications as listed.  Further evaluation will be based on future      symptoms and laboratory followup.  2. Dyslipidemia.  I would suggest an LDL less than 70 and HDL greater      than 40 as goals.  He should have his lipids checked in about 6      weeks with adjustments made as necessary.  We will defer to Dr.      Linna Darner.  3. Tobacco.  We had discussion (greater than 3 minutes) about the need      to stop smoking.  He will continue the Chantix and will work on       continuing to stop smoking.     Rollene Rotunda, MD, [facility]  Electronically Signed    JH/MedQ  DD: 04/07/2009  DT: 04/08/2009  Job #: [id number]   cc:   Erasmo Downer, MD

## 2011-02-21 NOTE — Discharge Summary (Signed)
NAMEMARRIO, [name] [name] NO.:  [id number]   MEDICAL RECORD NO.:  [id number]          PATIENT TYPE:  INP   LOCATION:  2038                         FACILITY:  [facility]   PHYSICIAN:  Rollene Rotunda, MD, FACCDATE OF BIRTH:  1966/01/05   DATE OF ADMISSION:  03/18/2009  DATE OF DISCHARGE:  03/20/2009                               DISCHARGE SUMMARY   CARDIOLOGIST:  Dr. Lewayne Bunting in our [place] clinic.   PRIMARY CARE PHYSICIAN:  Erasmo Downer, MD, in [place].   REASON FOR ADMISSION:  Acute coronary syndrome.   DISCHARGE DIAGNOSES:  1. Coronary artery disease.      a.     Status post non-ST-elevation myocardial infarction this       admission treated with bare metal stenting x2 to the vein graft to       the right coronary artery.      b.     Status post bypass surgery in November 2003.  2. Preserved left ventricular function.  3. Hyperlipidemia.  4. Chronic obstructive pulmonary disease with ongoing tobacco abuse.  5. Bipolar disorder.  6. Gastroesophageal reflux disease.  7. Gout.  8. History of noncompliance.  9. Status post trauma to his left leg, status post open reduction and      internal fixation.  10.Status post cholecystectomy.   PROCEDURES PERFORMED THIS ADMISSION:  Cardiac catheterization and  percutaneous coronary intervention by Dr. Tonny Bollman on March 18, 2009.  Please see his dictated note for complete details.  Briefly, the  patient has severe native 3-vessel disease, patent vein graft to the  diagonal 1, obtuse marginal 1 and ramus intermedius, patent LIMA to LAD  and a patent vein graft to the RCA with severe disease of 80% mid and  95% distal lesions.  His EF was preserved at 55% with mild hypokinesis  of the mid and distal anterior.   ADMISSION HISTORY:  [name] [name] is a 45 year old male who has been  followed intermittently in our [facility] clinic by Dr. Andee Lineman with a history  of coronary disease, status post bypass surgery in November 2003 in the  setting of evolving myocardial infarction.  His last cardiac  catheterization in August 2009 demonstrated patent grafts and normal LV  function.  The patient presented to [facility] emergency room  with complaints of chest discomfort for about 1 week.  His symptoms were  becoming unstable and his troponins were initially elevated.  He was  transported [facility] for further evaluation and treatment.   HOSPITAL COURSE:  The patient essentially ruled in for myocardial  infarction by enzymes.  His peak troponin was 0.38.  His CK-MBs remained  negative.  He underwent cardiac catheterization by Dr. Excell Seltzer as  outlined above.  He underwent bare metal stenting x2 to the vein graft  to the RCA.  He tolerated the procedure well and had no immediate  complications.  Dr. Excell Seltzer recommended dual antiplatelet therapy with  aspirin and Plavix for a minimum of 1 year in the setting of non-ST-  elevation myocardial infarction and diffuse vein graft disease.  Tobacco  cessation was discussed with the patient.  At discharge, Dr. Antoine Poche  discussed Chantix therapy.  The patient denies any current history of  depression or suicidal ideation.  Black box warnings have been discussed  with the patient and he will be started on chanting help with  discontinuation of smoking.  His medications were adjusted.  Norvasc was  discontinued and ACE inhibitor was initiated secondary to wall motion  abnormalities.  He was continued on his beta-blocker and he was started  on high-dose simvastatin for treatment of his dyslipidemia.  His  Prilosec was discontinued and he was placed on Protonix secondary to  concerns over interactions with Plavix.  He remained stable over the  next couple of days.  He did have some groin oozing from his cath site.  His hematocrit remained stable.  On the morning of March 20, 2009, he was  found be stable and ready for discharge to home by Dr. Antoine Poche.   LABORATORY AND  X-RAY DATA:  Hemoglobin 14.2, hematocrit 41.3, MCV 98.3.  INR 1.0.  Potassium 4.3, glucose 112, creatinine 0.87.  LFTs okay.  CK-  MB and troponin I as outlined above.  Total cholesterol 155,  triglycerides 393, HDL 29, LDL 79.  TSH 0.542.  Urine drug screen  positive for benzodiazepines and THC.  Urinalysis negative.  chest x-ray  on admission.  No acute chest findings.   DISCHARGE MEDICATIONS:  1. Simvastatin 40 mg q.h.s.  2. Protonix 40 mg daily.  3. Plavix 75 mg daily.  4. Lisinopril 2.5 mg daily.  5. Nitroglycerin p.r.n. chest pain  6. Chantix as directed.  7. Xanax 1 mg daily at bedtime.  8. Toprol XL 50 mg daily.  9. Aspirin 325 mg daily.   He has been advised stop taking his Prilosec and his Norvasc.   ALLERGIES:  No known drug allergies.   DIET:  Low-fat, low-sodium diet.   WOUND CARE:  He is to call our office for any groin swelling, bleeding,  bruising or fever.  He should return to work on or after April 03, 2009.   ACTIVITY:  He is to increase activity slowly.  He may walk up steps.  He  may shower.  No lifting or sexual activity weeks.  No driving for 1  week.   FOLLOWUP:  1. The patient will be set up to see Dr. Andee Lineman in 2 weeks in our [place]      clinic.  He will be contacted with an      appointment.  2. he patient should see Dr. Linna Darner as directed.  He should call for an      appointment.   TOTAL PHYSICIAN AND PA TIME:  Greater than 30 minutes on this discharge.      Tereso Newcomer, PA-C      Rollene Rotunda, MD, [facility]  Electronically Signed    SW/MEDQ  D:  03/20/2009  T:  03/20/2009  Job:  [id number]   cc:   Erasmo Downer, MD

## 2011-02-21 NOTE — H&P (Signed)
[name] [name], [name] [name] NO.:  [id number]   MEDICAL RECORD NO.:  [id number]          PATIENT TYPE:  EMS   LOCATION:  ED                            FACILITY:  [facility]   PHYSICIAN:  Jonelle Sidle, MD DATE OF BIRTH:  May 22, 1966   DATE OF ADMISSION:  03/18/2009  DATE OF DISCHARGE:  LH                              HISTORY & PHYSICAL   PRIMARY CARE PHYSICIAN:  Erasmo Downer, MD.   CARDIOLOGIST:  Learta Codding, MD, [facility].   CHIEF COMPLAINT:  Chest pain.   HISTORY OF PRESENT ILLNESS:  [name] is a 45 year old male with  history of coronary artery disease status post bypass surgery November  of 2003.  His last catheterization was done in August of 2009 after an  abnormal stress test that was performed for chest pain which  demonstrated 5 of 5 grafts being patent.  He was in his usual state of  health until about 1 week ago when he began to note substernal chest  discomfort and left arm discomfort that he describes as a tightness with  just minimal exertion.  He notes associated shortness of breath, nausea  and diaphoresis as well as near syncope.  He has had pain at rest at  times and has also been awakened from sleep on at least 2 occasions with  associated nausea and vomiting.  [place] the [place] pay  a traffic ticket and was noted to be having difficulty by the staff  there.  They directed him to the [facility] emergency room.  Of  note, the patient has taken nitroglycerin with relief since his symptoms  started about a week ago.  Rest usually makes his symptoms better as  well when they occur with exertion.  In the emergency room he has been  given Lovenox 1 mg per kg subcu x1 as well as aspirin, nitroglycerin  which has been transitioned over to nitroglycerin paste at 1 inch.  He  has point of care markers done x2 which are abnormal with troponins of  0.17, 0.18.  His electrocardiogram initially demonstrated sinus rhythm  with some  minimal ST elevation in the inferior leads which has been  present on prior tracings dating from April of 2007.  Otherwise he has  nonspecific ST-T wave changes, normal sinus rhythm and a heart rate of  63.  We have been asked to further evaluate.   PAST MEDICAL HISTORY:  1. Coronary artery disease status post bypass surgery in November of      2003.  This was done urgently secondary to evolving myocardial      infarction.      a.     Cardiac catheterization after abnormal Cardiolite August       2009 with patent vein graft to the PDA, patent vein graft to the       obtuse marginal, ramus intermedius and diagonal and patent LIMA to       the LAD with an EF of 55%.  2. Hyperlipidemia.  3. COPD.  4. Bipolar disorder.  5. GERD.  6.  Gout.  7. History of noncompliance.  8. History of trauma to his left leg status post ORIF.  9. Status post cholecystectomy.   HOME MEDICATIONS:  1. Prilosec 20 mg daily.  2. Xanax 1 mg daily.  3. Toprol-XL 50 mg daily.  4. Nitroglycerin p.r.n. ? 5 mg q.h.s.  5. Norvasc ? Daily.  6. Aspirin 325 mg daily.   ALLERGIES:  No known drug allergies.   SOCIAL HISTORY:  The patient lives in [place] with his wife.  He has a 30  plus pack year history of smoking and continues to smoke a half pack to  whole pack of cigarettes per day.  He drinks alcohol on occasion and  admits to continued marijuana use.  He has had a past history of cocaine  abuse but denies any cocaine use in years.   FAMILY HISTORY:  Significant for strokes in both his parents as well as  a sister.   REVIEW OF SYSTEMS:  Please see HPI.  Denies fevers, chills, sore throat,  dysuria, hematuria, rash, bright red blood per rectum or melena,  diarrhea, dysphagia, orthopnea, PND, pedal edema, palpitations, syncope  or cough.  All other systems reviewed and negative.   PHYSICAL EXAMINATION:  GENERAL:  He is well-nourished, well-developed.  VITAL SIGNS:  Blood pressure is 100/58, pulse 60,  respirations 12,  temperature 97, oxygen saturation 98% on 2 liters.  HEENT:  Normal.  NECK:  Without JVD.  No lymphadenopathy.  ENDOCRINE:  Without thyromegaly.  CARDIOVASCULAR:  Normal S1, S2, regular rate and rhythm without murmur  with distant heart sounds.  LUNGS:  Clear to auscultation bilaterally.  No wheeze, no rhonchi, no  rales.  SKIN:  Warm and dry.  ABDOMEN:  Soft, nontender with normoactive bowel sounds, no  organomegaly.  EXTREMITIES:  Without clubbing, cyanosis or edema.  MUSCULOSKELETAL:  Without joint deformity.  NEUROLOGICAL:  He is alert and oriented x3.  Cranial nerves II-XII  grossly intact.  VASCULAR:  No carotid bruits noted bilaterally.  Dorsalis pedis and  posterior tibialis pulses are 2+ bilaterally.  No femoral artery bruits  are noted bilaterally.   Chest x-ray nonacute.  No CHF.  EKG as outlined above.  Hemoglobin 14.1,  potassium 3.9, creatinine 0.87, glucose 143, point of care markers as  outlined above.   ASSESSMENT:  1. Chest pain syndrome in a patient with known coronary artery disease      status post bypass surgery in 2003 and stable anatomy with patent      grafts at cardiac catheterization August 2009 with elevated      troponin concerning for acute coronary syndrome/non-ST-elevation      myocardial infarction.  2. Hyperlipidemia.  3. Chronic obstructive pulmonary disease with ongoing tobacco abuse.  4. Bipolar disorder.  5. Gastroesophageal reflux disease.  6. History of polysubstance abuse.   RECOMMENDATIONS:  The patient was also interviewed and examined by Dr.  Diona Browner.  Prior records have been reviewed.  His symptoms are  concerning for unstable angina over the last week.  His EKG is  nonspecific.  His initial troponins are elevated as outlined above.  The  risks and benefits of pursuing with cardiac catheterization were  discussed with the patient.  He agreed to proceed.  We will transfer him  to [facility] from [facility] emergency room.  He will  be placed in a stepdown bed as he has continued to have some discomfort  in his chest at this time.  We will continue aspirin and beta-blocker  therapy.  A urine drug screen will be checked to ensure that he has no  signs of cocaine use in the past several weeks.  He has received Lovenox  and we will transition this over to IV heparin.  His nitro paste will be  discontinued and he will be placed on nitroglycerin drip.  He will also  be treated with morphine for pain.  As he just had a heart  catheterization within the last year that demonstrated stable anatomy  and patent bypass grafts we will go ahead and load him with Plavix at  600 mg a day.  He will then be placed on 75 mg a day after that.  He  will be kept n.p.o. for now with plans for cardiac catheterization later  this afternoon.  His Zocor will be increased to 40 mg q.h.s.  Lipids  will be checked as well as TSH.  Further recommendations to follow after  his cardiac catheterization.      Tereso Newcomer, PA-C      Jonelle Sidle, MD  Electronically Signed    SW/MEDQ  D:  03/18/2009  T:  03/18/2009  Job:  [id number]   cc:   Erasmo Downer, MD   Learta Codding, MD,FACC  [address]  [place], [place] [address]

## 2011-02-21 NOTE — Cardiovascular Report (Signed)
NAMEDANILE, TRIER NO.:  [id number]   MEDICAL RECORD NO.:  [id number]          PATIENT TYPE:  OIB   LOCATION:  2899                         FACILITY:  [facility]   PHYSICIAN:  Verne Carrow, MDDATE OF BIRTH:  March 25, 1966   DATE OF PROCEDURE:  05/21/2008  DATE OF DISCHARGE:  05/21/2008                            CARDIAC CATHETERIZATION   PROCEDURES:  1. Left heart catheterization.  2. Selective coronary angiography.  3. Left ventricular angiogram.  4. Selective injection of saphenous vein grafts and left internal      mammary artery.   INDICATIONS:  Chest pain in a patient with known coronary artery  disease, status post five-vessel CABG in 2003.   OPERATOR:  Verne Carrow, MD.   FINDINGS:  1. Left main coronary artery gives rise to the circumflex and LAD.      There is a 30-40% stenosis in the midportion of the left main      coronary artery.  2. Left anterior descending coronary artery has a 60-70% proximal      stenosis.  There is a 100% stenosis in the middle portion of the      left anterior descending.  The mid and distal LAD fill from the      left internal mammary artery.  There is a large diagonal that fills      via the  insaphenous vein graft.  3. The circumflex artery has a 95% proximal stenosis and gives off a      large ramus and another large obtuse marginal branch.  The mid      circumflex, the obtuse margin, and the ramus intermedius all fill      from the saphenous vein graft.  4. The right coronary artery has serial 70-90% lesions throughout the      proximal and mid portions.  The distal right coronary artery and      posterior descending artery fill via saphenous vein graft.  5. Left ventricular angiogram shows normal systolic function with no      wall motion abnormalities and an ejection fraction of 55%.  Left      ventricular pressure was 124/14 and end-diastolic pressure was 28.  6. SVG to the PDA is patent.  7. SVG   that is sequential to the obtuse marginal, ramus intermedius,      and diagonal is patent.  8. The left internal mammary artery to the mid LAD is patent.   IMPRESSION:  Stable coronary artery disease, status post five-vessel  coronary artery bypass grafting surgery with 5/5 patent grafts.  There  does not seem to have been a significant progression of his native  coronary artery disease.  The patient has been noncompliant with his  medications per his wife.  I feel that his chest discomfort maybe  somewhat alleviated by stable daily medical regimen with patient  compliance.   RECOMMENDATIONS:  No coronary interventions are recommended in the  current time.  The patient will be discharged to home with medical  management and follow up with Dr. Nona Dell in two weeks in the  [place] office.  Verne Carrow, MD  Electronically Signed     CM/MEDQ  D:  05/21/2008  T:  05/22/2008  Job:  [id number]   cc:   Jonelle Sidle, MD
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
## 2011-02-24 NOTE — Cardiovascular Report (Signed)
NAMEHARLAND, [name] [name] NO.:  [id number]   MEDICAL RECORD NO.:  [id number]                   PATIENT TYPE:  INP   LOCATION:  2855                                 FACILITY:  [facility]   PHYSICIAN:  Charlies Constable, M.D. [facility]              DATE OF BIRTH:  04/21/1966   DATE OF PROCEDURE:  08/15/2002  DATE OF DISCHARGE:                              CARDIAC CATHETERIZATION   CLINICAL HISTORY:  The patient is 45 years old and is a smoker and had a  strong family history of coronary disease. About 10:30 last night, he  developed severe chest pain and went to the emergency room at midnight.  His  initial ECG showed anterior ST segment depression and this resolved.  He had  recurrent symptoms this morning and was transferred to [place] urgently for  catheterization and possible intervention.  He was treated with aspirin,  heparin and Integrilin.   PROCEDURES PERFORMED:  Left heart catheterization, selective coronary  angiography, abdominal aortography, subclavian angiography.   DESCRIPTION OF PROCEDURE:  The procedure was performed via the right femoral  artery using an arterial sheath and 6 French preformed coronary catheters.  A front wall arterial puncture was performed and Omnipaque contrast was  used.  Subclavian injection was performed to assess the internal mammary  artery for its suitability for bypass grafting.  Distal aortogram was  performed to evaluate for possible peripheral vascular disease and rule out  abdominal aortic aneurysm.  The patient tolerated the procedure well and  left the laboratory in satisfactory condition.   RESULTS:  The aortic pressure was 112/81 with a mean of 95 and left  ventricular pressure was 112/26.   The left main coronary artery:  The left main coronary artery had a 60%  distal stenosis.   Left anterior descending:  The left anterior descending artery gave rise to  a septal perforator and a large diagonal branch.  There was 50%  narrowing in  the proximal LAD.  There was long 95% narrowing after the diagonal branch  with TIMI-2 flow distally.   Circumflex artery:  The circumflex artery gave rise to an intermediate  branch and an AV branch which terminated into two posterolateral branches.  There was 80% ostial stenosis and there was 80% stenosis in the intermediate  branch.  There was 90% stenosis in the midportion of the AV branch.   Right coronary artery:  The right coronary is a dominant vessel and gave  rise to a right ventricular branch, a posterior descending branch and two  posterolateral branches.  There was 50% proximal and distal stenosis and 40%  narrowing in the mid vessel.  There was 50% narrowing in the midportion of  the posterior descending branch.   LEFT VENTRICULOGRAPHY:  The left ventriculogram performed in the RAO  projection showed hypokinesis of the anterolateral wall.  The overall wall  motion was good with an estimated  ejection fraction of 55%.   LEFT VENTRICULOGRAM:  The left ventriculogram performed in the LAO  projection showed hypokinesis of the inferoseptal and apical segments.  The  estimated ejection fraction was 55%.   DISTAL AORTOGRAM:  A distal aortogram was performed which showed patent  renal arteries and no significant aortoiliac obstruction.   SUBCLAVIAN INJECTION:  A subclavian injection was performed which showed  patent vertebral and internal mammary arteries and no subclavian stenosis.   CONCLUSION:  Coronary artery disease with recent non-ST segment elevation  myocardial infarction with 60% narrowing in the distal left main, 50%  proximal and 95% mid stenosis in the left anterior descending artery, 80%  ostial stenosis in the circumflex artery, and 80% stenosis in the  intermediate branch and 90% stenosis in the midportion of the arteriovenous  branch, and 50% proximal and distal stenosis in the right coronary artery  with 50% narrowing in the posterior descending  branch and anterolateral wall  and apical wall hypokinesis.   RECOMMENDATIONS:  The patient has severe three-vessel disease and I think  can best be managed surgically. I have consulted Dr. [name].  The  patient did receive 300 mg of Plavix prior to angiography.                                                    Charlies Constable, M.D. [facility]    BB/MEDQ  D:  08/15/2002  T:  08/16/2002  Job:  [id number]   cc:   Jesse Sans. Wall, M.D. [facility]   Cooper Render, M.D.  [facility] Emergency Room   Cardiopulmonary Laboratory

## 2011-02-24 NOTE — Discharge Summary (Signed)
[name] [name], [name] [name] NO.:  [id number]   MEDICAL RECORD NO.:  [id number]          PATIENT TYPE:  OBV   LOCATION:  2010                         FACILITY:  [facility]   PHYSICIAN:  Lorain Childes, M.D. LHCDATE OF BIRTH:  29-Oct-1965   DATE OF ADMISSION:  01/09/2006  DATE OF DISCHARGE:  01/09/2006                           DISCHARGE SUMMARY - REFERRING   DISCHARGE DIAGNOSES:  1.  Prolonged atypical chest discomfort of uncertain etiology.  2.  Tobacco and drug use history as previously noted.   SUMMARY OF HISTORY:  [name] [name] is a 45 year old male who was transferred  from [facility] ER by [facility] for further evaluation of chest discomfort.  He stated that on Saturday evening he developed an anterior chest squeezing  sensation which also he described as sharp, left arm tingling, shortness of  breath.  He states that last time it was a zero was the preceding Friday.  Occasionally it is pleuritic.   PAST MEDICAL HISTORY:  1.  Hyperlipidemia.  2.  Obesity.  3.  Probable OSA, but refuses evaluation.  4.  Bipolar disorder.  5.  Depression.  6.  COPD.  7.  Gout.  8.  GERD.  9.  Four-vessel bypass surgery with last catheterization in July 2005 which      showed patent grafts except there was a probable 30% lesion on the LIMA      to the LAD.  EF of 65%.  10. Continued tobacco.  11. Continued pot use.   LABORATORY:  CK-MBs and troponins x2 were negative for myocardial  infarction.  PT was 12.6.  Fasting lipids showed total cholesterol 118,  triglycerides 118, HDL 31, LDL 63.  TSH 0.762.  EKG showed sinus bradycardia  without acute changes.   HOSPITAL COURSE:  Case manager also became involved as that the patient was  reported to be homeless and staying in a motel in [place] with spouse.  Tobacco  cessation consult was performed.  Dr. Dietrich Pates changed the patient to  observation status after enzymes and EKGs did not reveal any evidence of  myocardial infarction.  It  was felt that the patient could be discharged  home.   DISPOSITION:  He is discharged home.  Asked to maintain low salt, fat,  cholesterol diet.   DISCHARGE MEDICATIONS:  1.  New prescription for aspirin 325 daily.  2.  Nitroglycerin 0.4 p.r.n.  3.  Chantix 0.5 daily for three days and continue as directed.  4.  Xanax 0.5 mg q.h.s. p.r.n.  5.  He was asked to continue Toprol XL 50 mg daily.  6.  Prilosec 20 mg daily.  7.  Caduet 5/20 daily.  8.  Indocin as previously.  9.  Paxil 20 mg daily.   He will see Dr. Arnette Felts, P.A. in the [place] office on January 22, 2006 at  10:45.  Prior to that the office will call him to arrange stress test as an  outpatient.  He was advised no smoking, tobacco, or drug products.      Joellyn Rued, P.A. [facility]    ______________________________  Lorain Childes,  M.D. [facility]    EW/MEDQ  D:  04/11/2006  T:  04/11/2006  Job:  [id number]   cc:   Jonelle Sidle, M.D. [facility]  [address]  [place]  [place] [address]   [facility] Department

## 2011-02-24 NOTE — Cardiovascular Report (Signed)
[name] [name], [name] [name] NO.:  [id number]   MEDICAL RECORD NO.:  [id number]                   PATIENT TYPE:  INP   LOCATION:  2855                                 FACILITY:  [facility]   PHYSICIAN:  Charlies Constable, M.D. [facility]              DATE OF BIRTH:  May 02, 1966   DATE OF PROCEDURE:  08/15/2002  DATE OF DISCHARGE:                              CARDIAC CATHETERIZATION   CLINICAL HISTORY:  The patient is 45 years old and is a smoker and has a  strong positive family history of coronary disease.  About 10:30 last night  he developed severe chest pain and went to the emergency room at midnight.  His initial ECG showed anterior ST segment depression and this resolved.  He  had recurrent symptoms this morning and was transferred to [place] urgently for  catheterization and possible intervention.  He was treated with aspirin,  heparin and Integrilin.   PROCEDURES PERFORMED:  Left heart catheterization, selective coronary  angiography, abdominal aortography, subclavian angiography.   DESCRIPTION OF PROCEDURE:  The procedure was performed via the right femoral  artery using an arterial sheath and 6 French preformed coronary catheters.  A front wall arterial puncture was performed and Omnipaque contrast was  used.  A subclavian injection was performed to assess the internal mammary  artery for its suitability for bypass grafting.  A distal aortogram was  performed to evaluate for possible peripheral vascular disease and rule out  abdominal aortic aneurysm.  The patient tolerated the procedure well and  left the laboratory in satisfactory condition.   RESULTS:  The aortic pressure was 112/81 with a mean of 95 and left  ventricular pressure was 112/26.   The left main coronary artery:  The left main coronary artery had a 60%  distal stenosis.   Left anterior descending:  The left anterior descending artery gave rise to  a septal perforator and a large diagonal branch.  There was 50% narrowing in  the proximal LAD. There was a long 95% narrowing after the diagonal branch  with TIMI-2 flow distally.   Circumflex artery:  The circumflex artery gave rise to an intermediate  branch and an AV branch which terminated into two posterolateral branches.  There was 80% ostial stenosis and there was 80% stenosis in the intermediate  branch.  There was 90% stenosis in the midportion of the AV branch.   Right coronary artery:  The right coronary artery is a dominant vessel that  gave rise to a right ventricular branch, a posterior descending branch and  two posterolateral branches.  There was 50% proximal and distal stenosis and  40% narrowing in the mid vessel.  There was 50% narrowing in the midportion  of the posterior descending branch.   LEFT VENTRICULOGRAM:  The left ventriculogram performed in the RAO  projection showed hypokinesis of the anterolateral wall.  The overall wall  motion was good  with an estimated ejection fraction of 55%.   LEFT VENTRICULOGRAM:  The left ventriculogram performed in the LAO  projection showed hypokinesis of the inferoseptal and apical segments.  The  estimated ejection fraction was 55%.   DISTAL AORTOGRAM:  A distal aortogram was performed which showed patent  renal arteries and no significant aortoiliac obstruction.   SUBCLAVIAN INJECTION:  A subclavian injection was performed which showed  patent vertebral and internal mammary arteries and no subclavian stenosis.   CONCLUSION:  Coronary artery disease with recent non-ST segment elevation  myocardial infarction with 60% narrowing in the distal left main, 50%  proximal and 95% mid stenosis in the left anterior descending artery, 80%  ostial stenosis in the circumflex artery and 80% stenosis in the  intermediate branch and 90% stenosis in the midportion of the AV branch and  50% proximal and distal stenosis in the right coronary artery with 50%  narrowing in the posterior  descending branch and anterolateral wall and  apical wall hypokinesis.   RECOMMENDATIONS:  The patient has severe three-vessel disease and I think  can best be managed surgically. I have consulted Dr. [name].  The  patient did receive 300 mg of Plavix prior to his angiography.                                                  Charlies Constable, M.D. [facility]    BB/MEDQ  D:  08/15/2002  T:  08/16/2002  Job:  [id number]   cc:   Jesse Sans. Daleen Squibb, M.D. [facility]   Wynne Dust, M.D.   Cardiopulmonary Lab

## 2011-02-24 NOTE — Discharge Summary (Signed)
   NAMEROGUE, RAFALSKI NO.:  [id number]   MEDICAL RECORD NO.:  [id number]                   PATIENT TYPE:  INP   LOCATION:  2016                                 FACILITY:  [facility]   PHYSICIAN:  Jesse Sans. Wall, M.D. [facility]            DATE OF BIRTH:  Apr 28, 1966   DATE OF ADMISSION:  08/15/2002  DATE OF DISCHARGE:  08/19/2002                                 DISCHARGE SUMMARY   ADMISSION DIAGNOSIS:  Coronary artery disease.   SECONDARY DIAGNOSIS:  Postoperative anemia secondary to blood loss.   DISCHARGE DIAGNOSES:  Coronary artery disease.   HOSPITAL COURSE:  The patient was admitted to [facility] on  August 15, 2002, in order to undergo cardiac catheterization. He  underwent  this cardiac catheterization secondary to having  substernal chest pain  approximately one week ago. This cardiac catheterization revealed he had  significant coronary artery disease amenable to surgical correction. Because  of this Dr. [name] was consulted.   On August 15, 2002, Dr. [name] performed a coronary artery bypass  graft x 5 with left internal mammary artery graft to the left anterior  descending artery, saphenous vein graft to the distal right coronary artery  and a sequential saphenous vein graft to the diagonal vein and obtuse  marginal arteries. No complications were noted during the procedure.   Postoperatively the patient had a relatively uneventful hospital course. He  had mild postoperative anemia secondary to blood loss which he tolerated  well and no transfusion was required. His hemoglobin and hematocrit  stabilized 9.6 and 27.8 and with a BUN and creatinine at 8 and 0.8. He was  subsequently deemed stable for discharge to home on August 19, 2002.   DISCHARGE MEDICATIONS:  1. Ultram 50 mg 1 to 2 tablets q.4-6h. p.r.n. pain.  2. Lopressor 50 mg 1/2 tablet q.12h.  3. Aspirin 325 mg 1 q.d.   DISCHARGE INSTRUCTIONS:  The patient was told  no driving, strenuous activity  or lifting heavy objects. The patient was told  to walk daily and continue  breathing exercises. Discharge diet, low fat, low salt.  For  wound care the  patient was told he could shower and clean his incisions with soap and  water.   DISPOSITION:  Home.    FOLLOW UP:  The patient was told to call his cardiologist, Dr. Daleen Squibb, for his  two week appointment. In addition he was told to see Dr. [name] on  Wednesday, September 10, 2002, at 10 o'clock a.m. He was told to bring a chest  x-ray then.     Levin Erp. Steward, P.A.                      Thomas C. Daleen Squibb, M.D. [facility]    BGS/MEDQ  D:  08/18/2002  T:  08/19/2002  Job:  [id number]   cc:   [facility]

## 2011-02-24 NOTE — Op Note (Signed)
[name] [name], [name]                          ACCOUNT NO.:  [id number]   MEDICAL RECORD NO.:  [id number]                   PATIENT TYPE:  INP   LOCATION:  2314                                 FACILITY:  [facility]   PHYSICIAN:  Benz Vandenberghe. [name] [name] [name], M.D.         DATE OF BIRTH:  May 08, 1966   DATE OF PROCEDURE:  08/15/2002  DATE OF DISCHARGE:                                 OPERATIVE REPORT   PREOPERATIVE DIAGNOSIS:  Severe three-vessel coronary disease with acute  evolving myocardial infarction.   POSTOPERATIVE DIAGNOSES:  Severe three-vessel coronary disease with acute  evolving myocardial infarction.   PROCEDURE:  Emergency median sternotomy and extracorporeal circulation,  coronary artery bypass grafting x5 (LIMA to LAD, sequential saphenous vein  graft to diagonal 1, ramus intermedius, and obtuse marginal, saphenous vein  graft to distal right coronary).   SURGEON:  Richy Spradley. [name] [name] [name], M.D.   FIRST ASSISTANT:  [name], P.A.   ANESTHESIA:  General.   FINDINGS:  Good-quality target to septal rim, which is a 1 mm small, poor  quality vessel, good quality conduits, acute infarct of anterior wall,  weaned from cardiopulmonary bypass without difficulty.   CLINICAL NOTE:  This patient is a 45 year old gentleman who for a week had  been having studdering, waxing and waning chest pain.  He developed a severe  episode last night after dinner.  He went to the hospital at [facility].  His  initial cardiac enzymes were negative, and his pain improved; however,  approximately at 5 in morning, he developed severe, crushing substernal  chest pain.  The second set of enzymes was positive.  He was transferred to  [facility] further management.  He was taken emergently to the cardiac  catheterization lab by Dr. Charlies Constable, who found left main and severe  three-vessel disease, not amenable to percutaneous intervention.  The  patient continued to have chest pain and was referred for  urgent coronary  artery bypass grafting.  The indications, risks, benefits, and alternatives  were discussed in detail with the patient.  He understood and accepted the  risks and agreed to proceed.  He understood that there was increased  preoperative risk due to the emergent nature of the procedure and ongoing  ischemia.   OPERATIVE NOTE:  The patient was brought directly from the cath lab holding  area to the operating room.  Lines were placed to monitor arterial and  pulmonary arterial pressure.  EKG was replaced with continuous telemetry.  The patient was anesthetized and intubated.  A Foley catheter was placed.  Intravenous antibiotics were administered.  The chest, abdomen, and legs  were prepped and draped in the usual fashion.   A median sternotomy was performed, and the left internal mammary was  harvested using a standard technique.  An incision was made in the medial  aspect of the right leg, beginning at the ankle to the lower thigh, and  dissected using a  standard open technique.  The patient was fully  heparinized and to the distal end of the mammary artery.  A single mammary  was used with no radial or right mammary due to the urgent nature of the  procedure with ongoing ischemia, as well as the patient receiving Integrilin  and Plavix preoperatively.  There was excellent flow through the cut ends of  the mammary.  It was placed in a Papaverine-soaked sponge and placed into  the left pleural space.  The saphenous vein was of good quality.  It was  prepared by ligating the branches except those to be used for the sequential  grafting.   The pericardium was opened.  The ascending aorta was inspected and palpated.  There was no palpable atherosclerotic disease.  The aorta was cannulated via  concentric tool.  Ethibond, nonpledgeted, pursestring sutures, and a dual-  stage venous cannula was placed  and sutured in the right atrial appendage.  Coronary pulmonary bypass was  instituted, and the patient was cooled to 32  degrees Celsius. The coronary arteries were inspected, and anastomotic  places were chosen.  The conduits were inspected and kept to length.  The  foam pad was placed in the pericardium to protect the left phrenic nerve.  A  temperature tool was placed in the myocardial septum.  A cardioplegic  cannula was placed in the ascending aorta.  A retrograde cardioplegic  cannula was placed into the coronary sinus via pursestring suture in the  right atrium.  Positioning was confirmed with palpation of the tip, as well  as with the coronary sinus with pressure tracing with balloon inflation.   The aorta was cross clamped.  The left ventricle was entered into the via  the aortic root. Then, cardiac arrest was achieved with a combination of  cold antegrade and retrograde blood cardioplegia and topical iced saline.  Additional cardioplegia was administered at the completion of each graft  during the remainder of the procedure.  The following distal anastomoses  were performed.   First, a reverse saphenous vein graft was placed end to side in the distal  right coronary.  This was a 2 mm vessel.  There was some mild plaque but no  significant stenosis.  At the start of the anastomosis, a 1.5 mm probe  passed easily into the posterior descending, as well as into the  continuation vessels grafted at the level of the bifurcation.  The vein was  of good quality.  The anastomosis was performed with a running 7-0 Prolene  suture.  There was excellent flow through the graft.  Cardioplegia was  administered, and there was good hemostasis.   Next, a reverse saphenous vein graft was placed sequentially to the first  diagonal branch of the LAD, the ramus intermedius branch, and a small obtuse  marginal. The first diagonal and ramus were both 1.5 mm good quality  vessels.  The OM was a 1.0 mm thin-walled, poor quality vessel.  Side-to- side anastomoses were  performed to the diagonal and ramus intermedius.  All  side branches of the vein grafts were essentially end-to-side from the side  branches to the vessels. An end-to-side anastomosis then was performed to  the OM.  All were probed proximally and distally to ensure patency at the  completion of the anastomosis.  At the completion of each anastomosis, the  graft was flushed.  There was excellent flow.  Cardioplegia was  administered.  There was a small leak at the heel  of the ramus anastomosis  which was repaired with a 7-0 Prolene suture.  There was excellent flow  through the graft.   The left internal mammary artery then was brought through one end of the  pericardium, and the distal end was spatulated.  It was anastomosed end-to-  side to the distal LAD.  The LAD was a 1.5 mm good-quality target.  The  mammary was a 1.5 mm good-quality conduit.  At the completion of the  anastomosis, the bulldog clamp was briefly removed to inspect for  hemostasis.  The pin was replaced.  The mammary pedicle was tacked to the  epicardial surface of the heart with 6-0 Prolene sutures.  Immediate and  rapid septal rewarming was noted while the bulldog was off.  Additional  cardioplegia was administered down the vein graft, the aortic root, and via  the retrograde cannula in sequential fashion.  The vein grafts were cut to  length.   The cardioplegic cannula was removed from the ascending aorta.  The proximal  vein graft anastomoses were performed to 4.0 mm punches and aortotomies with  running 6-0 Prolene sutures.  At the completion of the final proximal  anastomosis, the patient was placed in steep Trendelenburg position.  Deairing maneuvers were performed.  A retrograde warm hot shot of  cardioplegia was administered via the retrograde catheter after completely  deairing the aortic root.  The aortic cross clamp was removed.  The total  cross clamp time was 86 minutes.  The patient spontaneously  resumed sinus  rhythm but did not require defibrillation.   All proximal and distal anastomoses were inspected for hemostasis.  The  retrograde cardioplegic cannula was removed.  Epicardial pacing wires were  placed on the right ventricle and right atrium.  The patient had been  rewarmed to a core temperature of 37 degrees Celsius and was weaned from  cardiopulmonary bypass without difficulty and was in sinus rhythm at the  time of separation from bypass.  Total bypass time was 133 minutes.  The  initial cardiac index was greater than 2 liters per minute per meter  squared, and the patient remained hemodynamically stable throughout the  postbypass period.   A test dose of Protamine was administered and was well-tolerated. The atrial  and aortic cannulae were removed.  The remainder of the Protamine was  administered without incident. The chest was irrigated with 1 liter of warm  normal saline, continuing 1 gm of vancomycin.  Hemostasis was achieved. Left pleural and two mediastinal chest tubes were placed through separate  subcostal incisions.  The pericardium was reapproximated with interrupted 3-  0 silk sutures.  They came together easily without tension.  The sternum was  closed with heavy-gage interrupted stainless steel wires.  The pectoralis  fascia was closed with a running #1 Vicryl suture.  The subcutaneous tissue  was closed with a running 2-0 Vicryl suture, and the skin was closed with a  3-0 Vicryl subcuticular suture.  All sponge, needle, and instrument counts  were correct at the end of the procedure.  There were no intraoperative  complications.  The patient was taken from the operating room to the  surgical intensive care unit in critical, but stable condition.  Salvatore Decent [name] [name] [name], M.D.    SCH/MEDQ  D:  08/15/2002  T:  08/17/2002  Job:  [id number]

## 2011-02-24 NOTE — Discharge Summary (Signed)
[name] [name], LIZER NO.:  [id number]   MEDICAL RECORD NO.:  [id number]                   PATIENT TYPE:  INP   LOCATION:  2041                                 FACILITY:  [facility]   PHYSICIAN:  Charlton Haws, M.D.                  DATE OF BIRTH:  08/21/1966   DATE OF ADMISSION:  02/13/2003  DATE OF DISCHARGE:  02/16/2003                           DISCHARGE SUMMARY - REFERRING   HISTORY OF PRESENT ILLNESS:  The patient is a 45 year old white married male  with dyslipidemia, ongoing cigarette use, bipolar disorder, and known  coronary artery disease per prior non-ST segment inferior wall myocardial  infarction August 15, 2002, after which he ultimately underwent CABG for  multivessel disease.  He is readmitted now with progressive symptoms gradual  in onset, occurring at rest while watching television described as sharp and  stabbing and somewhat different than they were before.  There are no known  precipitating or alleviating factors.  He was initially seen in [place], where  his troponins and MB's were negative, and he was transferred here for  further evaluation.   LABORATORY DATA:  Electrolytes and renal function totally within normal  limits.  Hemoglobin 15.6, hematocrit 46.7.  Remainder of lab work was done  in [place].   HOSPITAL COURSE:  The patient was admitted and underwent cardiac  catheterization after being held over the weekend during which time, he  remained stable. He was catheterized by Dr. Chales Abrahams on May 10, and found to  have a totally occluded mid-LAD, 90% proximal circumflex, 90% ramus, and a  70% distal right.  All of his vein grafts, though, were patent.  Specifically his LIMA to the LAD was patent, vein graft to the DX1 - ramus -  OM was patent and vein graft to the PDA was patent.  Ejection fraction was  reasonably well preserved at 50%. There was no significant flow-limiting  disease that was unprotected and he was treated medically  and discharged  home later that evening. His groin was stable at the time of this dictation.   FINAL DIAGNOSES:  1. Coronary artery disease with prior coronary artery bypass graft, admitted     now with somewhat typical chest pain. He was catheterized this admission     and found to have five of five patent grafts with preserved left     ventricular function - treated medically.  2. Ongoing cigarette use.  Once again he says he has quit.  3. Hyperlipidemia - he was begun on Lipitor 20 mg daily this admission. He     had not been previously on a statin.   DISPOSITION:  He will be followed up by Jonelle Sidle, M.D. [facility] in the  [place] office in a couple of weeks and longterm.  We did add Plavix to his  regimen as well short term and Dr. Diona Browner can make a decision regarding  when to stop this.  Other medications include Toprol 25 b.i.d., aspirin one  daily, Prilosec 20 daily, and nitroglycerin p.r.n.     Dian Queen, P.A. [facility]                     Charlton Haws, M.D.   BY/MEDQ  D:  02/16/2003  T:  02/17/2003  Job:  [id number]   cc:   [facility]   Meredith Mody, M.D., [place]

## 2011-02-24 NOTE — Consult Note (Signed)
NAMEJAQUAN, SADOWSKY                          ACCOUNT NO.:  [id number]   MEDICAL RECORD NO.:  [id number]                   PATIENT TYPE:  INP   LOCATION:  2855                                 FACILITY:  [facility]   PHYSICIAN:  Jaesean Litzau. [name], M.D.         DATE OF BIRTH:  01-Oct-1966   DATE OF CONSULTATION:  08/15/2002  DATE OF DISCHARGE:                                   CONSULTATION   CHIEF COMPLAINT:  Chest pain.   HISTORY OF PRESENT ILLNESS:  Mr. Keadle is a 45 year old male with a  history of smoking and marijuana use, and bipolar disorder for which he has  not been taking any medication.  He has about a five day history of  substernal chest pain, which radiates to his left arm and is associated with  shortness of breath.  This had been waxing and waning over the course of the  week.  Last night while at dinner, the pain became persistent and severe,  and eventually he went to [facility] at about midnight and was found  to have ST changes.  His initial cardiac enzymes were positive.  He improved  initially with nitroglycerin and beta blockers.  However, about 5 a.m. he  had recurrent pain with ST elevations in II, III, and aVF.  He was  transferred emergently to [facility] via [facility].  He was treated with  Integrilin and also given Plavix.  He was taken emergently to the cath lab  where he was found to have severe three vessel disease and left main  disease.  His coronary disease was not amenable to percutaneous  intervention.  His ejection fraction was approximately 50% with severe  anterior hypo- to akinesis.  The patient continues to have chest pain post  catheterization, currently rates a 5 on a scale of 1-10.  He has not had any  diaphoresis or nausea or vomiting, and is not currently short of breath.   PAST MEDICAL HISTORY:  1. Prior left knee surgery.  2. Bipolar disorder for which he is not taking medications.  3. Tobacco abuse.  4.  Hypercholesterolemia.  5. He has no history of diabetes or hypertension.  6. No previous history of cardiac disease.   MEDICATIONS AT THE TIME OF ADMISSION:  None.   ALLERGIES:  No known drug allergies.   FAMILY HISTORY:  Uncertain.  He does have family history of diabetes and  hypertension.  He is not sure about cardiac disease.   SOCIAL HISTORY:  He is disabled secondary to bipolar disorder.  He has some  physical limitations secondary to previous left knee surgery.   REVIEW OF SYSTEMS:  NEUROLOGIC:  Complains of possible mini stroke several  months ago.  No significant workup or sequelae from that.  GENERAL:  No  history of bleeding, bruising.  PSYCHOLOGIC:  No recent difficulties with  his bipolar disorder.  ALL OTHER SYSTEMS:  Negative.   PHYSICAL  EXAMINATION:  GENERAL:  Mr. Heckard is a 45 year old white male who  appears older than his stated age.  He is in discomfort but no acute  distress.  VITAL SIGNS:  Blood pressure 121/73, pulse 66 and regular, respirations 16.  NEUROLOGIC:  He is alert and oriented x3.  Slightly flat affect but is  appropriate.  HEENT:  Unremarkable.  NECK:  He has no carotid bruits.  No thyromegaly or adenopathy.  CARDIAC:  Regular rhythm and rate with normal S1 and S2.  No rubs, murmurs,  or gallops.  LUNGS:  Clear to auscultation and percussion.  ABDOMEN:  Soft and nontender.  EXTREMITIES:  Without cyanosis, clubbing or edema.  He has 2+ radial,  dorsalis pedis, and posterior tibial pulses throughout.  There is no  peripheral edema.  SKIN:  Warm, pink, and dry.   LABORATORY DATA:  Glucose 105.  BUN and creatinine 7 and 1.0.  Initial CK  was 143 with a troponin of 0.03.  Second set of enzymes was positive with a  CPK of 457, MB of 54.8, and troponin of 2.2.  PT was 12.4, PTT was 24.  Tox  screen was positive for marijuana.  White count 10.2, hematocrit 43,  platelets 259.   CARDIAC CATH:  Severe three vessel disease and left main disease,  anterior  hypokinesis.   IMPRESSION:  Mr. Mangold is a 45 year old white male who presents with an  acute myocardial infarction and now is having ongoing chest pain.  Catheterization with severe three vessel disease, he has moderate disease in  his right coronary, his left main, severe left anterior descending disease  with TIMI II flow in the left anterior descending and anterior hypokinesis.  He needs urgent revascularization for myocardial preservation and survival  benefit.  Percutaneous intervention is not an option.   I have discussed with Mr. Kitson the therapeutic options of medical therapy  versus revascularization with coronary artery bypass grafting.  He  understands the indications, risks, benefits, and alternatives.  He  understands the operative approach to be used.  He understands the risks of  surgery including but not limited to death, stroke, myocardial infarction,  deep vein thrombosis, pulmonary embolus, bleeding, possible need for  transfusions, infections, as well as organ system dysfunction including  respiratory, renal, hepatic, or gastrointestinal dysfunction.  He  understands and accepts these risks and agrees to proceed with surgery.  The  operating room has been notified and we will proceed as soon as possible.                                               Salvatore Decent [name], M.D.    SCH/MEDQ  D:  08/15/2002  T:  08/16/2002  Job:  [id number]   cc:   Charlies Constable, M.D. [facility]  [address]  [place]  [place] [address]

## 2011-02-24 NOTE — Cardiovascular Report (Signed)
NAME:  [name] [name], [name] [name]                        ACCOUNT NO.:  [id number]   MEDICAL RECORD NO.:  [id number]                   PATIENT TYPE:  OIB   LOCATION:  6501                                 FACILITY:  [facility]   PHYSICIAN:  Rollene Rotunda, M.D.                DATE OF BIRTH:  [birth year]   DATE OF PROCEDURE:  04/18/2004  DATE OF DISCHARGE:                              CARDIAC CATHETERIZATION   PRIMARY CARE PHYSICIAN:  None.   PROCEDURE:  Left heart catheterization, coronary arteriography.   INDICATION:  Evaluate patient with coronary disease very advanced for his  age.  He is status post coronary artery bypass graft.  He has been having  ongoing chest discomfort consistent with his previous angina.   PROCEDURE NOTE:  Left heart catheterization was performed via the right  femoral artery.  The artery was cannulated using an anterior wall puncture.  A 6 French arterial sheath was inserted via the modified Seldinger  technique.  Preformed Judkins and a pigtail catheter were utilized.  The  patient tolerated the procedure well and left the lab in stable condition.   RESULTS:   HEMODYNAMICS:  1. LV 103/18.  2. Aortic 101/78.   CORONARIES:  The left main was long.  There were diffuse luminal  irregularities.  It was narrow caliber vessel.  The LAD had proximal severe  long 95% stenosis followed by total occlusion.  The remainder of the vessel  was seen to fill via the LIMA graft.  There was mid long 80% stenosis  proximal to the insertion of the LIMA.  The LIMA backfilled two small  diagonals.  The distal and apical LAD had diffuse luminal irregularities.  The circumflex and the AV groove had severe diffuse narrowing compromising a  small OM-2.  The OM-1 was large and branching and subtotally stenosed in the  proximal segment.  It was seen to fill via sequential vein graft.  Large  ramus intermediate had long proximal 95% stenosis and was seen to fill via  the sequential vein graft.   The right coronary artery was a large dominant  vessel with moderate proximal and mid diffuse stenosis followed by long 80%  stenosis before the PDA.  The PDA had diffuse luminal irregularities.  It  was predominantly seen to fill via vein graft.   GRAFTS:  LIMA to the LAD was patent with questionable 30% anastomotic  lesion.  Saphenous vein graft sequential to the ramus intermediate and OM-1  was widely patent.  The saphenous vein graft to the PDA was widely patent.   LEFT VENTRICULOGRAM:  Left ventriculogram was patent in the RAO projection.  The EF was 65% with normal wall motion.   CONCLUSION:  Severe three-vessel coronary artery disease with patent grafts  and preserved left ventricular function.   PLAN:  The patient will have further attempts at aggressive secondary risk  reduction.  Rollene Rotunda, M.D.    JH/MEDQ  D:  04/18/2004  T:  04/18/2004  Job:  [id number]   cc:   [facility] [place]

## 2011-02-24 NOTE — Cardiovascular Report (Signed)
[name] [name], [name] [name] NO.:  [id number]   MEDICAL RECORD NO.:  [id number]                   PATIENT TYPE:  INP   LOCATION:  2041                                 FACILITY:  [facility]   PHYSICIAN:  Veneda Melter, M.D.                   DATE OF BIRTH:  1966/04/04   DATE OF PROCEDURE:  02/16/2003  DATE OF DISCHARGE:  02/16/2003                              CARDIAC CATHETERIZATION   PROCEDURES PERFORMED:  1. Left heart catheterization.  2. Left ventriculogram.  3. Selective coronary angiography.  4. Selective angiography of the saphenous vein and internal mammary bypass     grafts.   DIAGNOSES:  1. Native three vessel coronary artery disease.  2. Low normal left ventricular systolic function.  3. Patent bypass grafts.   INDICATIONS:  The patient is a 45 year old white male with advanced coronary  artery disease who has undergone surgical revascularization November 2003.  At that time, he had LIMA graft placed to the LAD with vein graft to the  distal right coronary artery as well as sequential vein graft to first  diagonal, ramus intermedius and marginal branch of the left circumflex  artery.  He presents now with recurrent substernal chest discomfort that is  somewhat atypical in nature.  He was admitted to the hospital and ruled out  for acute myocardial infarction and underwent stress imaging study at  [facility]  This showed anterior wall ischemia.  He was referred for further  assessment.   TECHNIQUE:  After informed consent was obtained, the patient was brought to  the cardiac catheterization lab.  A 6 French sheath placed into the right  femoral artery using the modified Seldinger technique.  The 6 [place] and  JR4 catheters were then used to engage the left and right coronary arteries,  and selective angiography was performed in various projections using manual  injections of contrast.  A JR4 catheter was also used to engage the  saphenous vein  and internal mammary bypass grafts.  Subsequently a 6 French  pigtail catheter was advanced in the left ventricle and a left  ventriculogram was performed using power injections of contrast.  At the  termination of the case, catheters and sheaths were removed and manual  pressure applied under adequate hemostasis was achieved.  The patient  tolerated the procedure well and was transferred to the floor in stable  condition.   FINDINGS:  Left main trunk:  The left main trunk is a small caliber vessel,  long in its extent.  There is mild diffuse disease of 30% in the midsection.   Left anterior descending:  The left anterior descending is a medium caliber  vessel that provides to diagonal branches.  The LAD has severe diffuse  disease of 70% in the proximal segment encompassing the first diagonal  branch.  It is then 100% occluded in the mid section.  The distal  LAD fills  via a LIMA graft.  It is a small caliber vessel that is diffusely diseased.  There is 70% after a second diagonal branch which fills via retrograde flow.  There is also disease of 50-60% at the anastomotic site with LIMA graft.  The distal and apical LADs have mild irregularities of 30-40%.  The first  diagonal branch fills via a saphenous vein graft and has mild  irregularities.  The second diagonal branch is a small caliber vessel that  is diffusely diseased.   Left circumflex artery:  The left circumflex artery is a small caliber  vessel that consists of a distal marginal branch.  The circumflex artery is  diseased proximally greater than 90%.  The small distal marginal branch  fills the sequential vein graft.   Ramus intermedius:  This is a medium caliber vessel with severe disease of  90-95% proximally.  The distal segment fills via the sequential vein graft  and has mild irregularities.   Right coronary artery is dominant.  It is a medium caliber vessel that  supplies the posterior descending artery and two  posterior ventricular  branches in the terminal segment.  The right coronary artery has diffuse  disease of 30% in the proximal mid section.  There is narrowing of 50% after  the RV marginal followed by a tubular 70% prior to the crux.  The posterior  descending artery has mild narrowing of 40% in the midsection.  The distal  posterior ventricular branches have moderate narrowings as well.   The left internal mammary artery to the left anterior descending is patent.   The saphenous vein graft to the first diagonal with continuation to ramus  and continuation distal marginal branch left circumflex artery is patent.  The ostium and proximal segment are stretched and are thus tapered  approximately 40-50%.   The saphenous vein graft to the right coronary artery is patent.   </PRESSURES/LEFT  1. LV:  Normal end systolic and end diastolic dimensions.  Overall left     ventricular function is low normal.  Ejection fraction is less than 50%.     There is hypokinesis of the anterolateral wall.  No mitral regurgitation     is noted.  LV pressure is 100/10, aortic is 100/60.  LVEDP is 20.   ASSESSMENT AND PLAN:  The patient is a 45 year old gentleman with native  three vessel coronary artery disease, patent bypass grafts and overall well  preserved LV function.  He has ischemia in the LAD territory and there is  moderate anastomotic lesion with the LIMA graft.  At this point, however, it  does not appear to be critical and continued medical therapy will be  recommended.  In addition, the patient's pain was atypical in nature.  He  had some chest discomfort during injection of his coronaries and was  different than his pain on presentation.                                               Veneda Melter, M.D.    NG/MEDQ  D:  02/16/2003  T:  02/17/2003  Job:  [id number]   cc:   [facility]   Salvatore Decent [name], M.D.  [address]  [place] [address]  Fax: [phone]

## 2011-03-03 ENCOUNTER — Other Ambulatory Visit: Payer: Self-pay | Admitting: Cardiology

## 2011-03-20 ENCOUNTER — Encounter: Payer: Self-pay | Admitting: Cardiology

## 2011-03-24 ENCOUNTER — Emergency Department (HOSPITAL_COMMUNITY)
Admission: EM | Admit: 2011-03-24 | Discharge: 2011-03-25 | Disposition: A | Discharge status: Home / Self Care | Payer: PRIVATE HEALTH INSURANCE | Attending: Emergency Medicine | Admitting: Emergency Medicine

## 2011-03-24 DIAGNOSIS — Z951 Presence of aortocoronary bypass graft: Secondary | ICD-10-CM | POA: Insufficient documentation

## 2011-03-24 DIAGNOSIS — J449 Chronic obstructive pulmonary disease, unspecified: Secondary | ICD-10-CM | POA: Insufficient documentation

## 2011-03-24 DIAGNOSIS — I10 Essential (primary) hypertension: Secondary | ICD-10-CM | POA: Insufficient documentation

## 2011-03-24 DIAGNOSIS — F319 Bipolar disorder, unspecified: Secondary | ICD-10-CM | POA: Insufficient documentation

## 2011-03-24 DIAGNOSIS — F17200 Nicotine dependence, unspecified, uncomplicated: Secondary | ICD-10-CM | POA: Insufficient documentation

## 2011-03-24 DIAGNOSIS — I2510 Atherosclerotic heart disease of native coronary artery without angina pectoris: Secondary | ICD-10-CM | POA: Insufficient documentation

## 2011-03-24 DIAGNOSIS — M25539 Pain in unspecified wrist: Secondary | ICD-10-CM | POA: Insufficient documentation

## 2011-03-24 DIAGNOSIS — J4489 Other specified chronic obstructive pulmonary disease: Secondary | ICD-10-CM | POA: Insufficient documentation

## 2011-03-24 DIAGNOSIS — Z7982 Long term (current) use of aspirin: Secondary | ICD-10-CM | POA: Insufficient documentation

## 2011-03-30 ENCOUNTER — Ambulatory Visit: Payer: Self-pay | Admitting: Cardiology

## 2011-04-05 ENCOUNTER — Other Ambulatory Visit: Payer: Self-pay | Admitting: *Deleted

## 2011-04-05 MED ORDER — NITROGLYCERIN 0.4 MG SL SUBL: 0.4000 mg | SUBLINGUAL_TABLET | SUBLINGUAL | Status: DC | PRN

## 2011-04-05 MED ORDER — PRAVASTATIN SODIUM 40 MG PO TABS: 40.0000 mg | ORAL_TABLET | Freq: Every day | ORAL | Status: DC

## 2011-05-04 ENCOUNTER — Other Ambulatory Visit: Payer: Self-pay | Admitting: Cardiology

## 2011-05-12 ENCOUNTER — Encounter: Payer: Self-pay | Admitting: Cardiology

## 2011-05-12 ENCOUNTER — Ambulatory Visit (INDEPENDENT_AMBULATORY_CARE_PROVIDER_SITE_OTHER): Payer: 59 | Admitting: Cardiology

## 2011-05-12 VITALS — BP 105/72 | HR 66 | Ht 63.0 in | Wt 211.0 lb

## 2011-05-12 DIAGNOSIS — I2510 Atherosclerotic heart disease of native coronary artery without angina pectoris: Secondary | ICD-10-CM

## 2011-05-12 DIAGNOSIS — E785 Hyperlipidemia, unspecified: Secondary | ICD-10-CM

## 2011-05-12 DIAGNOSIS — F17200 Nicotine dependence, unspecified, uncomplicated: Secondary | ICD-10-CM

## 2011-05-12 NOTE — Progress Notes (Signed)
HPI The patient is a 45 year old male with a history of non-ST elevation microinfarction status post CABG in 2003. Last year he underwent drug-eluting stent placement of the saphenous vein graft to the right coronary artery secondary to severe recurrent stenosis. He is otherwise a patent LIMA graft and saphenous vein graft to the diagonal marginal branch as well as ejection fraction of 45-50%. The patient stated he is doing well. He denies any chest pain or shortness of breath. He is compliant with his medical regimen. He continues to take his antiplatelet agent. Has no palpitations presyncope or syncope. He has no orthopnea PND or other symptoms.  No Known Allergies  Current Outpatient Prescriptions on File Prior to Visit  Medication Sig Dispense Refill  . aspirin 325 MG EC tablet Take 325 mg by mouth daily.        . diazepam (VALIUM) 5 MG tablet Take 5 mg by mouth 2 (two) times daily.        . isosorbide mononitrate (IMDUR) 60 MG 24 hr tablet Take 1 1/2 tablet every am and 1/2 tablet every pm      . lisinopril (PRINIVIL,ZESTRIL) 2.5 MG tablet Take 2.5 mg by mouth daily.        . metoprolol (TOPROL-XL) 50 MG 24 hr tablet Take 75 mg by mouth daily.        Marland Kitchen NITROSTAT 0.4 MG SL tablet PLACE 1 TABLET UNDER THE TONGUE EVERY 5 MINUTES AS NEEDED.  25 tablet  3  . pantoprazole (PROTONIX) 40 MG tablet Take 40 mg by mouth daily. Obtain further refills from primary MD       . prasugrel (EFFIENT) 10 MG TABS Take 10 mg by mouth daily.        . pravastatin (PRAVACHOL) 40 MG tablet TAKE ONE TABLET BY MOUTH DAILY  30 tablet  0  . RANEXA 500 MG 12 hr tablet TAKE ONE TABLET BY MOUTH TWICE DAILY  60 tablet  1    Past Medical History  Diagnosis Date  . CAD (coronary artery disease)     severe native vessel disease with LAD occlusion, circumflex occlusion, and right  corronary occlusion. A LIMA to the LAD was patent. although there was a diffuse nonobstructive disease distal to the anastomosis. the saphenous  vein graft to the first diagonal ramus intermediate an OM was patent. the saphenous vein graft  to the distal right coronary artery was patent with an 80% defect in the mid   . CAD (coronary artery disease)     segment and a 95% distal stenosis w/in the graft. The EF was 55%. the pt subsequently had 2 bare metal stents placed to the saphenous vein graft.   . Other and unspecified hyperlipidemia     mixed  . COPD (chronic obstructive pulmonary disease)   . Bipolar disorder   . GERD (gastroesophageal reflux disease)   . Gout     Past Surgical History  Procedure Date  . Cholecystectomy     Family History  Problem Relation Age of Onset  . Stroke      family Hx     History   Social History  . Marital Status: Married    Spouse Name: N/A    Number of Children: N/A  . Years of Education: N/A   Occupational History  . Not on file.   Social History Main Topics  . Smoking status: Current Everyday Smoker -- 0.8 packs/day for 33 years    Types: Cigarettes  . Smokeless tobacco:  Never Used  . Alcohol Use: No  . Drug Use: Yes     still uses marijuana but has DC cocaine use  . Sexually Active: Not on file   Other Topics Concern  . Not on file   Social History Narrative   Married.     [id number] positives as outlined above. The remainder of the 18  point review of systems is negative  PHYSICAL EXAM BP 105/72  Pulse 66  Ht 5\' 3"  (1.6 m)  Wt 211 lb (95.709 kg)  BMI 37.38 kg/m2  General: Overweight white male Head: Normocephalic and atraumatic Eyes:PERRLA/EOMI intact, conjunctiva and lids normal Ears: No deformity or lesions Mouth:normal dentition, normal posterior pharynx Neck: Supple, no JVD.  No masses, thyromegaly or abnormal cervical nodes Lungs: Normal breath sounds bilaterally without wheezing.  Normal percussion Cardiac: regular rate and rhythm with normal S1 and S2, no S3 or S4.  PMI is normal.  No pathological murmurs Abdomen: Normal bowel sounds, abdomen is  soft and nontender without masses, organomegaly or hernias noted.  No hepatosplenomegaly MSK: Back normal, normal gait muscle strength and tone normal Vascular: Pulse is normal in all 4 extremities Extremities: No peripheral pitting edema Neurologic: Alert and oriented x 3 Skin: Intact without lesions or rashes Lymphatics: No significant adenopathy Psychologic: Normal affect  ECG: Normal sinus rhythm no acute changes  ASSESSMENT AND PLAN

## 2011-05-12 NOTE — Assessment & Plan Note (Signed)
The patient was counseled regarding tobacco use. He said he cut back to half a pack a day.

## 2011-05-12 NOTE — Assessment & Plan Note (Signed)
The patient is status post stent placement to the vein graft. There is no recurrent chest pain. He continues on and dual platelet therapy

## 2011-05-12 NOTE — Assessment & Plan Note (Signed)
Follow up with primary care physician

## 2011-05-12 NOTE — Patient Instructions (Signed)
Continue all current medications. Your physician wants you to follow up in: 6 months.  You will receive a reminder letter in the mail one-two months in advance.  If you don't receive a letter, please call our office to schedule the follow up appointment   

## 2011-05-26 ENCOUNTER — Other Ambulatory Visit: Payer: Self-pay | Admitting: *Deleted

## 2011-05-26 MED ORDER — RANOLAZINE ER 500 MG PO TB12: 500.0000 mg | ORAL_TABLET | Freq: Two times a day (BID) | ORAL | Status: DC

## 2011-05-31 ENCOUNTER — Other Ambulatory Visit: Payer: Self-pay | Admitting: Cardiology

## 2011-06-01 NOTE — Telephone Encounter (Signed)
Refill request

## 2011-06-27 ENCOUNTER — Other Ambulatory Visit: Payer: Self-pay | Admitting: *Deleted

## 2011-06-27 ENCOUNTER — Telehealth: Payer: Self-pay | Admitting: *Deleted

## 2011-06-27 DIAGNOSIS — E789 Disorder of lipoprotein metabolism, unspecified: Secondary | ICD-10-CM

## 2011-06-27 DIAGNOSIS — Z79899 Other long term (current) drug therapy: Secondary | ICD-10-CM

## 2011-06-27 MED ORDER — PRAVASTATIN SODIUM 40 MG PO TABS: 40.0000 mg | ORAL_TABLET | Freq: Every day | ORAL | Status: DC

## 2011-06-27 MED ORDER — ISOSORBIDE MONONITRATE ER 60 MG PO TB24: ORAL_TABLET | ORAL | Status: DC

## 2011-06-27 MED ORDER — RANOLAZINE ER 500 MG PO TB12: 500.0000 mg | ORAL_TABLET | Freq: Two times a day (BID) | ORAL | Status: DC

## 2011-06-27 MED ORDER — LISINOPRIL 2.5 MG PO TABS: 2.5000 mg | ORAL_TABLET | Freq: Every day | ORAL | Status: DC

## 2011-06-27 MED ORDER — PRASUGREL HCL 10 MG PO TABS: 10.0000 mg | ORAL_TABLET | Freq: Every day | ORAL | Status: DC

## 2011-06-27 MED ORDER — METOPROLOL SUCCINATE ER 50 MG PO TB24: 75.0000 mg | ORAL_TABLET | Freq: Every day | ORAL | Status: DC

## 2011-06-27 NOTE — Telephone Encounter (Signed)
[facility] informed patient that he had to contact our office r/e refills because of messages sent to them. Patient informed that he needed FLP/LFT since no recent results in chart. Patient informed nurse that PCP did not check his cholesterol. Nurse informed patient that he could go to [facility] and have fasting labs done.

## 2011-07-07 LAB — DRUGS OF ABUSE SCREEN W/O ALC, ROUTINE URINE
Barbiturate Quant, Ur: NEGATIVE
Cocaine Metabolites: NEGATIVE
Marijuana Metabolite: POSITIVE — AB
Opiate Screen, Urine: NEGATIVE
Phencyclidine (PCP): NEGATIVE

## 2011-07-07 LAB — BASIC METABOLIC PANEL
CO2: 26
Calcium: 8.9
Chloride: 105
Creatinine, Ser: 0.8
Glucose, Bld: 87

## 2011-07-07 LAB — PROTIME-INR
INR: 0.9
Prothrombin Time: 12.3

## 2011-07-07 LAB — CBC
MCHC: 33.8
MCV: 99
RDW: 14.4

## 2011-07-07 LAB — THC (MARIJUANA), URINE, CONFIRMATION: Marijuana, Ur-Confirmation: 190 ng/mL

## 2011-08-02 ENCOUNTER — Other Ambulatory Visit: Payer: Self-pay | Admitting: Cardiology

## 2011-11-30 ENCOUNTER — Telehealth: Payer: Self-pay | Admitting: *Deleted

## 2011-11-30 NOTE — Telephone Encounter (Signed)
Message left on voice mail - having some problems with heart.  Returned call - no answer.

## 2011-12-19 ENCOUNTER — Other Ambulatory Visit: Payer: Self-pay | Admitting: *Deleted

## 2011-12-19 MED ORDER — PRAVASTATIN SODIUM 40 MG PO TABS: 40.0000 mg | ORAL_TABLET | Freq: Every day | ORAL | Status: DC

## 2011-12-22 MED ORDER — PRAVASTATIN SODIUM 40 MG PO TABS: 40.0000 mg | ORAL_TABLET | Freq: Every day | ORAL | Status: DC

## 2011-12-22 NOTE — Telephone Encounter (Signed)
Addended by: Eustace Moore on: 12/22/2011 04:27 PM   Modules accepted: Orders

## 2011-12-22 NOTE — Telephone Encounter (Signed)
Refill called to [facility] since it went to wrong pharmacy on the 12th.

## 2012-02-23 ENCOUNTER — Other Ambulatory Visit: Payer: Self-pay | Admitting: Cardiology

## 2012-02-23 MED ORDER — LISINOPRIL 2.5 MG PO TABS: 2.5000 mg | ORAL_TABLET | Freq: Every day | ORAL | Status: DC

## 2012-02-23 MED ORDER — RANOLAZINE ER 500 MG PO TB12: 500.0000 mg | ORAL_TABLET | Freq: Two times a day (BID) | ORAL | Status: DC

## 2012-02-23 MED ORDER — METOPROLOL SUCCINATE ER 50 MG PO TB24: 50.0000 mg | ORAL_TABLET | Freq: Every day | ORAL | Status: DC

## 2012-02-23 MED ORDER — PRASUGREL HCL 10 MG PO TABS: 10.0000 mg | ORAL_TABLET | Freq: Every day | ORAL | Status: DC

## 2012-02-23 MED ORDER — ISOSORBIDE MONONITRATE ER 60 MG PO TB24: ORAL_TABLET | ORAL | Status: DC

## 2012-03-15 ENCOUNTER — Other Ambulatory Visit: Payer: Self-pay | Admitting: Physician Assistant

## 2012-03-15 ENCOUNTER — Telehealth: Payer: Self-pay

## 2012-03-15 ENCOUNTER — Encounter: Payer: Self-pay | Admitting: *Deleted

## 2012-03-15 ENCOUNTER — Ambulatory Visit (INDEPENDENT_AMBULATORY_CARE_PROVIDER_SITE_OTHER): Payer: PRIVATE HEALTH INSURANCE | Admitting: Physician Assistant

## 2012-03-15 ENCOUNTER — Encounter: Payer: Self-pay | Admitting: Physician Assistant

## 2012-03-15 VITALS — BP 132/82 | HR 52 | Resp 16 | Ht 64.0 in | Wt 217.0 lb

## 2012-03-15 DIAGNOSIS — I2510 Atherosclerotic heart disease of native coronary artery without angina pectoris: Secondary | ICD-10-CM

## 2012-03-15 DIAGNOSIS — E785 Hyperlipidemia, unspecified: Secondary | ICD-10-CM

## 2012-03-15 DIAGNOSIS — Z01810 Encounter for preprocedural cardiovascular examination: Secondary | ICD-10-CM

## 2012-03-15 DIAGNOSIS — R079 Chest pain, unspecified: Secondary | ICD-10-CM

## 2012-03-15 DIAGNOSIS — F17200 Nicotine dependence, unspecified, uncomplicated: Secondary | ICD-10-CM

## 2012-03-15 NOTE — Assessment & Plan Note (Signed)
Discussed the importance of smoking cessation, greater than 3 minutes in duration. Patient feels that he can quit "cold [place]".

## 2012-03-15 NOTE — Telephone Encounter (Signed)
[employer] YNWG#[id number] expires 04-29-12

## 2012-03-15 NOTE — Telephone Encounter (Signed)
Left JV hrt cath - Thursday, 6/13 - 11:00 - [facility].

## 2012-03-15 NOTE — Assessment & Plan Note (Addendum)
Patient presents with recent development of recurrent, exertional CP, worrisome for underlying significant CAD progression, including either graft disease or ISR. His most recent intervention was in July 2011, by Dr. Riley Kill, following presentation with NST EMI. He was treated with DES of the distal RCA, through previously placed SVG. Distal protection was not employed. Mild LVD (EF 45-50%). Therefore, recommended diagnostic coronary angiography and possible PCI. Patient agreeable with this plan and risks/benefits were discussed, in conjunction with Dr. Andee Lineman. We'll continue current medication regimen, which includes full dose aspirin and Effient. Unable to up titrate beta blocker, secondary to SB. Patient has renewed prescription for NTG.

## 2012-03-15 NOTE — Assessment & Plan Note (Signed)
We'll reassess lipid status with a FLP/LFT profile. Aggressive management recommended with target LDL 70 or less, if feasible. Continue current dose pravastatin, pending review.

## 2012-03-15 NOTE — Patient Instructions (Signed)
   Left heart cath scheduled for next week - see info sheet given  Labs - precath & fasting lipid and liver  If the results of your test are normal or stable, you will receive a letter.  If they are abnormal, the nurse will contact you by phone. Follow up will be given at time of discharge

## 2012-03-15 NOTE — Progress Notes (Signed)
HPI: Patient presents for routine followup.  Since last seen here, 8/12, by Dr. DeGent, patient has developed exertional CP over the last several weeks. He denies any rest angina. His symptoms are associated only with moderate exertion, not while walking on level ground. He suggests some similarity to prior presentations, but states that these are not as intense (6/10). Symptoms are referred to as a "heaviness", overlying the left precordium, and with no radiation to jaw or upper extremities. Symptoms resolved promptly with rest. He has not had to use any NTG.   12-lead EKG today indicates marked SB 49 bpm with NSST changes   No Known Allergies  Current Outpatient Prescriptions  Medication Sig Dispense Refill  . aspirin 325 MG EC tablet Take 325 mg by mouth daily.        . diazepam (VALIUM) 5 MG tablet Take 5 mg by mouth 2 (two) times daily.        . ergocalciferol (VITAMIN D2) 50000 UNITS capsule Take 50,000 Units by mouth once a week.      . HYDROcodone-acetaminophen (VICODIN) 5-500 MG per tablet Take 1 tablet by mouth every 6 (six) hours as needed.      . isosorbide mononitrate (IMDUR) 60 MG 24 hr tablet Take 1 1/2 tablet every am and 1/2 tablet every pm  60 tablet  6  . lisinopril (PRINIVIL,ZESTRIL) 2.5 MG tablet Take 1 tablet (2.5 mg total) by mouth daily.  30 tablet  6  . metoprolol succinate (TOPROL-XL) 50 MG 24 hr tablet Take 1 tablet (50 mg total) by mouth daily. Take 1 & 1/2 tablets daily.  45 tablet  6  . NITROSTAT 0.4 MG SL tablet PLACE 1 TABLET UNDER THE TONGUE EVERY 5 MINUTES AS NEEDED.  25 tablet  3  . pantoprazole (PROTONIX) 40 MG tablet Take 40 mg by mouth daily. Obtain further refills from primary MD       . prasugrel (EFFIENT) 10 MG TABS Take 1 tablet (10 mg total) by mouth daily.  30 tablet  6  . pravastatin (PRAVACHOL) 40 MG tablet Take 1 tablet (40 mg total) by mouth daily.  30 tablet  6  . ranolazine (RANEXA) 500 MG 12 hr tablet Take 1 tablet (500 mg total) by mouth 2  (two) times daily.  60 tablet  6  . DISCONTD: ranolazine (RANEXA) 500 MG 12 hr tablet Take 1 tablet (500 mg total) by mouth 2 (two) times daily.  60 tablet  4    Past Medical History  Diagnosis Date  . CAD (coronary artery disease)     severe native vessel disease with LAD occlusion, circumflex occlusion, and right  corronary occlusion. A LIMA to the LAD was patent. although there was a diffuse nonobstructive disease distal to the anastomosis. the saphenous vein graft to the first diagonal ramus intermediate an OM was patent. the saphenous vein graft  to the distal right coronary artery was patent with an 80% defect in the mid   . CAD (coronary artery disease)     segment and a 95% distal stenosis w/in the graft. The EF was 55%. the pt subsequently had 2 bare metal stents placed to the saphenous vein graft.   . Other and unspecified hyperlipidemia     mixed  . COPD (chronic obstructive pulmonary disease)   . Bipolar disorder   . GERD (gastroesophageal reflux disease)   . Gout     Past Surgical History  Procedure Date  . Cholecystectomy     History     Social History  . Marital Status: Married    Spouse Name: N/A    Number of Children: N/A  . Years of Education: N/A   Occupational History  . Not on file.   Social History Main Topics  . Smoking status: Current Everyday Smoker -- 0.8 packs/day for 33 years    Types: Cigarettes  . Smokeless tobacco: Never Used  . Alcohol Use: No  . Drug Use: Yes     still uses marijuana but has DC cocaine use  . Sexually Active: Not on file   Other Topics Concern  . Not on file   Social History Narrative   Married.    Social History Narrative   Married.     Problem Relation Age of Onset  . Stroke      family Hx     ROS: no nausea, vomiting; no fever, chills; no melena, hematochezia; no claudication  PHYSICAL EXAM: BP 132/82  Pulse 52  Resp 16  Ht 5' 4" (1.626 m)  Wt 217 lb (98.431 kg)  BMI 37.25 kg/m2 GENERAL: 46 year-old  male, moderately obese, sitting upright; NAD HEENT: NCAT, PERRLA, EOMI; sclera clear; no xanthelasma NECK: palpable bilateral carotid pulses, no bruits;  unable to assess JVD, secondary to neck girth LUNGS: CTA bilaterally CARDIAC: RRR (S1, S2); no significant murmurs; no rubs or gallops ABDOMEN:  protuberant  EXTREMETIES:  palpable bilateral femoral pulses, without bruits; intact distal pulses; no significant peripheral edema SKIN: warm/dry; no obvious rash/lesions MUSCULOSKELETAL: no joint deformity NEURO: no focal deficit; NL affect   EKG: reviewed and available in Electronic Records   ASSESSMENT & PLAN:  CAD, NATIVE VESSEL Patient presents with recent development of recurrent, exertional CP, worrisome for underlying significant CAD progression, including either graft disease or ISR. His most recent intervention was in July 2011, by Dr. Stuckey, following presentation with NST EMI. He was treated with DES of the distal RCA, through previously placed SVG. Distal protection was not employed. Mild LVD (EF 45-50%). Therefore, recommended diagnostic coronary angiography and possible PCI. Patient agreeable with this plan and risks/benefits were discussed, in conjunction with Dr. Degent. We'll continue current medication regimen, which includes full dose aspirin and Effient. Unable to up titrate beta blocker, secondary to SB. Patient has renewed prescription for NTG.  TOBACCO USER Discussed the importance of smoking cessation, greater than 3 minutes in duration. Patient feels that he can quit "cold turkey".  HYPERLIPIDEMIA-MIXED We'll reassess lipid status with a FLP/LFT profile. Aggressive management recommended with target LDL 70 or less, if feasible. Continue current dose pravastatin, pending review.     Gene Stefanos Haynesworth, PAC  

## 2012-03-20 ENCOUNTER — Inpatient Hospital Stay (HOSPITAL_BASED_OUTPATIENT_CLINIC_OR_DEPARTMENT_OTHER)
Admission: RE | Admit: 2012-03-20 | Discharge: 2012-03-20 | Disposition: A | Discharge status: Home / Self Care | Payer: PRIVATE HEALTH INSURANCE | Source: Ambulatory Visit | Attending: Cardiovascular Disease | Admitting: Cardiovascular Disease

## 2012-03-20 ENCOUNTER — Encounter (HOSPITAL_BASED_OUTPATIENT_CLINIC_OR_DEPARTMENT_OTHER): Payer: Self-pay | Admitting: *Deleted

## 2012-03-20 ENCOUNTER — Encounter (HOSPITAL_BASED_OUTPATIENT_CLINIC_OR_DEPARTMENT_OTHER)
Admission: RE | Disposition: A | Discharge status: Home / Self Care | Payer: Self-pay | Source: Ambulatory Visit | Attending: Cardiovascular Disease

## 2012-03-20 DIAGNOSIS — I2510 Atherosclerotic heart disease of native coronary artery without angina pectoris: Secondary | ICD-10-CM

## 2012-03-20 DIAGNOSIS — J4489 Other specified chronic obstructive pulmonary disease: Secondary | ICD-10-CM | POA: Insufficient documentation

## 2012-03-20 DIAGNOSIS — K219 Gastro-esophageal reflux disease without esophagitis: Secondary | ICD-10-CM | POA: Insufficient documentation

## 2012-03-20 DIAGNOSIS — I209 Angina pectoris, unspecified: Secondary | ICD-10-CM | POA: Insufficient documentation

## 2012-03-20 DIAGNOSIS — F319 Bipolar disorder, unspecified: Secondary | ICD-10-CM | POA: Insufficient documentation

## 2012-03-20 DIAGNOSIS — I25810 Atherosclerosis of coronary artery bypass graft(s) without angina pectoris: Secondary | ICD-10-CM | POA: Insufficient documentation

## 2012-03-20 DIAGNOSIS — R079 Chest pain, unspecified: Secondary | ICD-10-CM

## 2012-03-20 DIAGNOSIS — F17200 Nicotine dependence, unspecified, uncomplicated: Secondary | ICD-10-CM | POA: Insufficient documentation

## 2012-03-20 DIAGNOSIS — J449 Chronic obstructive pulmonary disease, unspecified: Secondary | ICD-10-CM | POA: Insufficient documentation

## 2012-03-20 DIAGNOSIS — M109 Gout, unspecified: Secondary | ICD-10-CM | POA: Insufficient documentation

## 2012-03-20 SURGERY — JV LEFT HEART CATHETERIZATION WITH CORONARY/GRAFT ANGIOGRAM
Anesthesia: Moderate Sedation

## 2012-03-20 MED ORDER — SODIUM CHLORIDE 0.9 % IV SOLN: 250.0000 mL | INTRAVENOUS | Status: DC | PRN

## 2012-03-20 MED ORDER — ACETAMINOPHEN 325 MG PO TABS: 650.0000 mg | ORAL_TABLET | ORAL | Status: DC | PRN

## 2012-03-20 MED ORDER — SODIUM CHLORIDE 0.9 % IJ SOLN: 3.0000 mL | Freq: Two times a day (BID) | INTRAMUSCULAR | Status: DC

## 2012-03-20 MED ORDER — SODIUM CHLORIDE 0.9 % IV SOLN: INTRAVENOUS | Status: AC

## 2012-03-20 MED ORDER — DIAZEPAM 5 MG PO TABS
5.0000 mg | ORAL_TABLET | ORAL | Status: AC
  Administered 2012-03-20: 5 mg via ORAL

## 2012-03-20 MED ORDER — SODIUM CHLORIDE 0.9 % IV SOLN
INTRAVENOUS | Status: DC
  Administered 2012-03-20: 07:00:00 via INTRAVENOUS

## 2012-03-20 MED ORDER — ONDANSETRON HCL 4 MG/2ML IJ SOLN: 4.0000 mg | Freq: Four times a day (QID) | INTRAMUSCULAR | Status: DC | PRN

## 2012-03-20 MED ORDER — SODIUM CHLORIDE 0.9 % IJ SOLN: 3.0000 mL | INTRAMUSCULAR | Status: DC | PRN

## 2012-03-20 MED ORDER — ASPIRIN 81 MG PO CHEW
324.0000 mg | CHEWABLE_TABLET | ORAL | Status: AC
  Administered 2012-03-20: 324 mg via ORAL

## 2012-03-20 NOTE — H&P (View-Only) (Signed)
HPI: Patient presents for routine followup.  Since last seen here, 8/12, by Dr. Andee Lineman, patient has developed exertional CP over the last several weeks. He denies any rest angina. His symptoms are associated only with moderate exertion, not while walking on level ground. He suggests some similarity to prior presentations, but states that these are not as intense (6/10). Symptoms are referred to as a "heaviness", overlying the left precordium, and with no radiation to jaw or upper extremities. Symptoms resolved promptly with rest. He has not had to use any NTG.   12-lead EKG today indicates marked SB 49 bpm with NSST changes   No Known Allergies  Current Outpatient Prescriptions  Medication Sig Dispense Refill  . aspirin 325 MG EC tablet Take 325 mg by mouth daily.        . diazepam (VALIUM) 5 MG tablet Take 5 mg by mouth 2 (two) times daily.        . ergocalciferol (VITAMIN D2) 50000 UNITS capsule Take 50,000 Units by mouth once a week.      Marland Kitchen HYDROcodone-acetaminophen (VICODIN) 5-500 MG per tablet Take 1 tablet by mouth every 6 (six) hours as needed.      . isosorbide mononitrate (IMDUR) 60 MG 24 hr tablet Take 1 1/2 tablet every am and 1/2 tablet every pm  60 tablet  6  . lisinopril (PRINIVIL,ZESTRIL) 2.5 MG tablet Take 1 tablet (2.5 mg total) by mouth daily.  30 tablet  6  . metoprolol succinate (TOPROL-XL) 50 MG 24 hr tablet Take 1 tablet (50 mg total) by mouth daily. Take 1 & 1/2 tablets daily.  45 tablet  6  . NITROSTAT 0.4 MG SL tablet PLACE 1 TABLET UNDER THE TONGUE EVERY 5 MINUTES AS NEEDED.  25 tablet  3  . pantoprazole (PROTONIX) 40 MG tablet Take 40 mg by mouth daily. Obtain further refills from primary MD       . prasugrel (EFFIENT) 10 MG TABS Take 1 tablet (10 mg total) by mouth daily.  30 tablet  6  . pravastatin (PRAVACHOL) 40 MG tablet Take 1 tablet (40 mg total) by mouth daily.  30 tablet  6  . ranolazine (RANEXA) 500 MG 12 hr tablet Take 1 tablet (500 mg total) by mouth 2  (two) times daily.  60 tablet  6  . DISCONTD: ranolazine (RANEXA) 500 MG 12 hr tablet Take 1 tablet (500 mg total) by mouth 2 (two) times daily.  60 tablet  4    Past Medical History  Diagnosis Date  . CAD (coronary artery disease)     severe native vessel disease with LAD occlusion, circumflex occlusion, and right  corronary occlusion. A LIMA to the LAD was patent. although there was a diffuse nonobstructive disease distal to the anastomosis. the saphenous vein graft to the first diagonal ramus intermediate an OM was patent. the saphenous vein graft  to the distal right coronary artery was patent with an 80% defect in the mid   . CAD (coronary artery disease)     segment and a 95% distal stenosis w/in the graft. The EF was 55%. the pt subsequently had 2 bare metal stents placed to the saphenous vein graft.   . Other and unspecified hyperlipidemia     mixed  . COPD (chronic obstructive pulmonary disease)   . Bipolar disorder   . GERD (gastroesophageal reflux disease)   . Gout     Past Surgical History  Procedure Date  . Cholecystectomy     History  Social History  . Marital Status: Married    Spouse Name: N/A    Number of Children: N/A  . Years of Education: N/A   Occupational History  . Not on file.   Social History Main Topics  . Smoking status: Current Everyday Smoker -- 0.8 packs/day for 33 years    Types: Cigarettes  . Smokeless tobacco: Never Used  . Alcohol Use: No  . Drug Use: Yes     still uses marijuana but has DC cocaine use  . Sexually Active: Not on file   Other Topics Concern  . Not on file   Social History Narrative   Married.    Social History Narrative   Married.     Problem Relation Age of Onset  . Stroke      family Hx     ROS: no nausea, vomiting; no fever, chills; no melena, hematochezia; no claudication  PHYSICAL EXAM: BP 132/82  Pulse 52  Resp 16  Ht 5\' 4"  (1.626 m)  Wt 217 lb (98.431 kg)  BMI 37.25 kg/m2 GENERAL: 46 year-old  male, moderately obese, sitting upright; NAD HEENT: NCAT, PERRLA, EOMI; sclera clear; no xanthelasma NECK: palpable bilateral carotid pulses, no bruits;  unable to assess JVD, secondary to neck girth LUNGS: CTA bilaterally CARDIAC: RRR (S1, S2); no significant murmurs; no rubs or gallops ABDOMEN:  protuberant  EXTREMETIES:  palpable bilateral femoral pulses, without bruits; intact distal pulses; no significant peripheral edema SKIN: warm/dry; no obvious rash/lesions MUSCULOSKELETAL: no joint deformity NEURO: no focal deficit; NL affect   EKG: reviewed and available in Electronic Records   ASSESSMENT & PLAN:  CAD, NATIVE VESSEL Patient presents with recent development of recurrent, exertional CP, worrisome for underlying significant CAD progression, including either graft disease or ISR. His most recent intervention was in July 2011, by Dr. Riley Kill, following presentation with NST EMI. He was treated with DES of the distal RCA, through previously placed SVG. Distal protection was not employed. Mild LVD (EF 45-50%). Therefore, recommended diagnostic coronary angiography and possible PCI. Patient agreeable with this plan and risks/benefits were discussed, in conjunction with Dr. Andee Lineman. We'll continue current medication regimen, which includes full dose aspirin and Effient. Unable to up titrate beta blocker, secondary to SB. Patient has renewed prescription for NTG.  TOBACCO USER Discussed the importance of smoking cessation, greater than 3 minutes in duration. Patient feels that he can quit "cold [place]".  HYPERLIPIDEMIA-MIXED We'll reassess lipid status with a FLP/LFT profile. Aggressive management recommended with target LDL 70 or less, if feasible. Continue current dose pravastatin, pending review.     Gene Lenore Moyano, PAC

## 2012-03-20 NOTE — Progress Notes (Signed)
Bedrest begins @ 0840. 

## 2012-03-20 NOTE — CV Procedure (Signed)
   Cardiac Catheterization Operative Report  [name] [name] [id number] 6/12/20138:26 AM [name],[name], MD  Procedure Performed:  1. Left Heart Catheterization 2. Selective Coronary Angiography 3. Left ventricular angiogram 4. SVG angiography 5. LIMA graft angiography  Operator: Verne Carrow, MD  Indication:  Chest pain concerning for unstable angina, s/p 5V CABG with multiple stenting procedures in the SVG to the RCA with continued tobacco abuse.                                      Procedure Details: The risks, benefits, complications, treatment options, and expected outcomes were discussed with the patient. The patient and/or family concurred with the proposed plan, giving informed consent. The patient was brought to the cath lab after IV hydration was begun and oral premedication was given. The patient was further sedated with Versed and Fentanyl. The right groin was prepped and draped in the usual manner. Using the modified Seldinger access technique, a 4 French sheath was placed in the right femoral artery. Standard diagnostic catheters were used to perform selective coronary angiography. An RCB catheter was used to engage the vein graft to the RCA. The [facility] catheter was used to engage the LIMA graft and the vein graft to the left system. A pigtail catheter was used to perform a left ventricular angiogram.  There were no immediate complications. The patient was taken to the recovery area in stable condition.   Hemodynamic Findings: Central aortic pressure: 122/69 Left ventricular pressure: 124/19/20  Angiographic Findings:  Left main: Diffusely diseased, 60% mid stenosis.   Left Anterior Descending Artery: 100% proximal occlusion. Mid and distal vessel fills from LIMA bypass graft. The diagonal fills from the SVG.   Circumflex Artery: 99% proximal stenosis.   Right Coronary Artery:100% proximal occlusion. The vein graft to the distal RCA is occluded. The distal RCA  fills via left to right collaterals.    Graft Anatomy:   SVG to RCA is occluded at the ostium  SVG sequential  to OM, intermediate and Diagonal is patent.   LIMA to mid LAD is patent.   Left Ventricular Angiogram: LVEF 50%.   Impression: 1. Severe native vessel CAD s/p 5V CABG with 4/5 patent bypass grafts.  2. Angina likely secondary to occlusion of SVG to RCA, however, distal RCA now fills from left to right collaterals.  3. Overall preserved LV systolic function 4. Continued tobacco abuse  Recommendations: Will continue medical management. Smoking cessation recommended.        Complications:  None. The patient tolerated the procedure well.

## 2012-03-20 NOTE — Interval H&P Note (Signed)
History and Physical Interval Note:  03/20/2012 7:33 AM  [name] [name]  has presented today for surgery, with the diagnosis of cp  The various methods of treatment have been discussed with the patient and family. After consideration of risks, benefits and other options for treatment, the patient has consented to  Procedure(s) (LRB): JV LEFT HEART CATHETERIZATION WITH CORONARY/GRAFT ANGIOGRAM (N/A) as a surgical intervention .  The patients' history has been reviewed, patient examined, no change in status, stable for surgery.  I have reviewed the patients' chart and labs.  Questions were answered to the patient's satisfaction.     Hani Campusano

## 2012-04-05 ENCOUNTER — Encounter: Payer: PRIVATE HEALTH INSURANCE | Admitting: Physician Assistant

## 2012-08-01 ENCOUNTER — Other Ambulatory Visit: Payer: Self-pay | Admitting: Cardiology

## 2012-08-01 MED ORDER — RANOLAZINE ER 500 MG PO TB12: 500.0000 mg | ORAL_TABLET | Freq: Two times a day (BID) | ORAL | Status: DC

## 2012-08-01 MED ORDER — PRAVASTATIN SODIUM 40 MG PO TABS: 40.0000 mg | ORAL_TABLET | Freq: Every day | ORAL | Status: DC

## 2012-09-23 ENCOUNTER — Other Ambulatory Visit: Payer: Self-pay | Admitting: *Deleted

## 2012-09-23 MED ORDER — LISINOPRIL 2.5 MG PO TABS: 2.5000 mg | ORAL_TABLET | Freq: Every day | ORAL | Status: DC

## 2012-09-23 MED ORDER — PRASUGREL HCL 10 MG PO TABS: 10.0000 mg | ORAL_TABLET | Freq: Every day | ORAL | Status: DC

## 2012-09-23 MED ORDER — METOPROLOL SUCCINATE ER 50 MG PO TB24: ORAL_TABLET | ORAL | Status: DC

## 2012-09-23 MED ORDER — ISOSORBIDE MONONITRATE ER 60 MG PO TB24: ORAL_TABLET | ORAL | Status: DC

## 2012-11-25 ENCOUNTER — Other Ambulatory Visit: Payer: Self-pay | Admitting: Cardiology

## 2012-11-25 MED ORDER — PRASUGREL HCL 10 MG PO TABS: 10.0000 mg | ORAL_TABLET | Freq: Every day | ORAL | Status: DC

## 2012-11-25 MED ORDER — LISINOPRIL 2.5 MG PO TABS: 2.5000 mg | ORAL_TABLET | Freq: Every day | ORAL | Status: DC

## 2012-11-25 MED ORDER — METOPROLOL SUCCINATE ER 50 MG PO TB24: ORAL_TABLET | ORAL | Status: DC

## 2012-12-05 ENCOUNTER — Encounter: Payer: Self-pay | Admitting: Physician Assistant

## 2012-12-05 ENCOUNTER — Ambulatory Visit (INDEPENDENT_AMBULATORY_CARE_PROVIDER_SITE_OTHER): Payer: PRIVATE HEALTH INSURANCE | Admitting: Physician Assistant

## 2012-12-05 ENCOUNTER — Other Ambulatory Visit: Payer: Self-pay | Admitting: *Deleted

## 2012-12-05 VITALS — BP 134/80 | HR 53 | Wt 224.0 lb

## 2012-12-05 DIAGNOSIS — I10 Essential (primary) hypertension: Secondary | ICD-10-CM | POA: Insufficient documentation

## 2012-12-05 DIAGNOSIS — E785 Hyperlipidemia, unspecified: Secondary | ICD-10-CM

## 2012-12-05 DIAGNOSIS — I498 Other specified cardiac arrhythmias: Secondary | ICD-10-CM

## 2012-12-05 DIAGNOSIS — R001 Bradycardia, unspecified: Secondary | ICD-10-CM

## 2012-12-05 DIAGNOSIS — F17200 Nicotine dependence, unspecified, uncomplicated: Secondary | ICD-10-CM

## 2012-12-05 DIAGNOSIS — I2510 Atherosclerotic heart disease of native coronary artery without angina pectoris: Secondary | ICD-10-CM

## 2012-12-05 MED ORDER — LISINOPRIL 2.5 MG PO TABS: 2.5000 mg | ORAL_TABLET | Freq: Two times a day (BID) | ORAL | Status: DC

## 2012-12-05 MED ORDER — ASPIRIN EC 81 MG PO TBEC: 81.0000 mg | DELAYED_RELEASE_TABLET | Freq: Every day | ORAL | Status: AC

## 2012-12-05 MED ORDER — METOPROLOL SUCCINATE ER 50 MG PO TB24: ORAL_TABLET | ORAL | Status: DC

## 2012-12-05 MED ORDER — ISOSORBIDE MONONITRATE ER 60 MG PO TB24: 60.0000 mg | ORAL_TABLET | Freq: Two times a day (BID) | ORAL | Status: DC

## 2012-12-05 MED ORDER — PRAVASTATIN SODIUM 80 MG PO TABS: 80.0000 mg | ORAL_TABLET | Freq: Every evening | ORAL | Status: DC

## 2012-12-05 NOTE — Patient Instructions (Addendum)
   Decrease Aspirin to 81mg  daily  Stop Effient  Increase Pravachol to 80mg  every evening (new sent to pharm)  Lab in 12 weeks (around May 27) will mail reminder in mail - fasting lipid and liver panel   Increase Lisinopril to 2.5mg  twice a day (new sent to pharm) Continue all other current medications. Your physician wants you to follow up in: 6 months.  You will receive a reminder letter in the mail one-two months in advance.  If you don't receive a letter, please call our office to schedule the follow up appointment

## 2012-12-05 NOTE — Assessment & Plan Note (Signed)
Continued medical management is recommended. Following review with Dr. Diona Browner, recommendation is to continue current combination regimen of both Imdur and Ranexa, which patient was on at time of last OV. Will otherwise decrease ASA to 81 mg daily, and discontinue Effient (status post DES S-RCA in 2011, since 100% occluded)

## 2012-12-05 NOTE — Assessment & Plan Note (Signed)
Will increase lisinopril to 2.5 mg daily for more aggressive management.

## 2012-12-05 NOTE — Assessment & Plan Note (Signed)
LDL 87, when FLP last checked in June 2013. Will increase Pravachol to 80 mg daily, and reassess lipid status in 12 weeks.

## 2012-12-05 NOTE — Telephone Encounter (Addendum)
Phone call placed to [facility] to cancel rx for Pravastatin and Lisinopril.  Patient prefers to use [facility] .  90 day supply sent in on Imdur, Lisinopril, Metoprolol, and Pravastatin.  Patient aware.

## 2012-12-05 NOTE — Assessment & Plan Note (Signed)
Smoking cessation was once again advised and patient appears motivated to at least significantly cut back, as he soon approaches his next birthday.

## 2012-12-05 NOTE — Progress Notes (Signed)
Primary Cardiologist: Simona Huh, MD (new)   HPI: Patient presents for six-month followup.  When last seen here in June 2013, I referred him for a diagnostic cardiac catheterization for further evaluation of recurrent exertional CP. This yielded 4/5 patent bypass grafts with new occlusion of S-RCA graft, but with L-R distal collateralization; EF 50%. Continued medical management and smoking cessation was recommended.  He presents today still smoking, but reports that he will attempt to significantly cut back when he turns 47 next week. Regarding chest pain, he had one singular episode approximately a month ago, which resolved spontaneously with rest. He otherwise denies any recurrent episodes or development of exertional chest discomfort.  No Known Allergies  Current Outpatient Prescriptions  Medication Sig Dispense Refill  . allopurinol (ZYLOPRIM) 300 MG tablet Take 300 mg by mouth daily.       Marland Kitchen COLCRYS 0.6 MG tablet Take 0.6 mg by mouth 2 (two) times daily.       . cyclobenzaprine (FLEXERIL) 10 MG tablet Take 10 mg by mouth 3 (three) times daily as needed.       . diazepam (VALIUM) 5 MG tablet Take 5 mg by mouth 2 (two) times daily.        Marland Kitchen HYDROcodone-acetaminophen (NORCO/VICODIN) 5-325 MG per tablet Take 1 tablet by mouth 2 (two) times daily.      . indomethacin (INDOCIN) 50 MG capsule Take 50 mg by mouth 3 (three) times daily.      . isosorbide mononitrate (IMDUR) 60 MG 24 hr tablet Take 60 mg by mouth 2 (two) times daily.      Marland Kitchen lisinopril (PRINIVIL,ZESTRIL) 2.5 MG tablet Take 1 tablet (2.5 mg total) by mouth 2 (two) times daily.  60 tablet  6  . metoprolol succinate (TOPROL-XL) 50 MG 24 hr tablet Take 1&1/2 tablets by mouth daily  45 tablet  0  . NITROSTAT 0.4 MG SL tablet PLACE 1 TABLET UNDER THE TONGUE EVERY 5 MINUTES AS NEEDED.  25 tablet  3  . pantoprazole (PROTONIX) 40 MG tablet Take 40 mg by mouth daily. Obtain further refills from primary MD       . pravastatin (PRAVACHOL)  80 MG tablet Take 1 tablet (80 mg total) by mouth every evening.  30 tablet  6  . ranolazine (RANEXA) 500 MG 12 hr tablet Take 1 tablet (500 mg total) by mouth 2 (two) times daily.  60 tablet  6  . aspirin EC 81 MG tablet Take 1 tablet (81 mg total) by mouth daily.       No current facility-administered medications for this visit.    Past Medical History  Diagnosis Date  . CAD (coronary artery disease)     severe native vessel disease with LAD occlusion, circumflex occlusion, and right  corronary occlusion. A LIMA to the LAD was patent. although there was a diffuse nonobstructive disease distal to the anastomosis. the saphenous vein graft to the first diagonal ramus intermediate an OM was patent. the saphenous vein graft  to the distal right coronary artery was patent with an 80% defect in the mid   . CAD (coronary artery disease)     segment and a 95% distal stenosis w/in the graft. The EF was 55%. the pt subsequently had 2 bare metal stents placed to the saphenous vein graft.   . Other and unspecified hyperlipidemia     mixed  . COPD (chronic obstructive pulmonary disease)   . Bipolar disorder   . GERD (gastroesophageal reflux disease)   .  Gout     Past Surgical History  Procedure Laterality Date  . Cholecystectomy      History   Social History  . Marital Status: Married    Spouse Name: N/A    Number of Children: N/A  . Years of Education: N/A   Occupational History  . Not on file.   Social History Main Topics  . Smoking status: Current Every Day Smoker -- 0.80 packs/day for 33 years    Types: Cigarettes  . Smokeless tobacco: Never Used  . Alcohol Use: No  . Drug Use: Yes     Comment: still uses marijuana but has DC cocaine use  . Sexually Active: Not on file   Other Topics Concern  . Not on file   Social History Narrative   Married.     Family History  Problem Relation Age of Onset  . Stroke      family Hx     ROS: no nausea, vomiting; no fever, chills;  no melena, hematochezia; no claudication  PHYSICAL EXAM: BP 134/80  Pulse 53  Wt 224 lb (101.606 kg)  BMI 38.43 kg/m2 GENERAL: 47 year-old male, and somewhat disheveled appearing; NAD HEENT: NCAT, PERRLA, EOMI; sclera clear; no xanthelasma NECK: palpable bilateral carotid pulses, no bruits; no JVD; no TM LUNGS: CTA bilaterally CARDIAC: RRR (S1, S2); no significant murmurs; no rubs or gallops ABDOMEN: soft, protuberant EXTREMETIES: no significant peripheral edema SKIN: warm/dry; no obvious rash/lesions MUSCULOSKELETAL: no joint deformity NEURO: no focal deficit; NL affect   EKG:    ASSESSMENT & PLAN:  CAD, NATIVE VESSEL Continued medical management is recommended. Following review with Dr. Diona Browner, recommendation is to continue current combination regimen of both Imdur and Ranexa, which patient was on at time of last OV. Will otherwise decrease ASA to 81 mg daily, and discontinue Effient (status post DES S-RCA in 2011, since 100% occluded)  TOBACCO USER Smoking cessation was once again advised and patient appears motivated to at least significantly cut back, as he soon approaches his next birthday.  HTN (hypertension) Will increase lisinopril to 2.5 mg daily for more aggressive management.  HYPERLIPIDEMIA-MIXED LDL 87, when FLP last checked in June 2013. Will increase Pravachol to 80 mg daily, and reassess lipid status in 12 weeks.  Sinus bradycardia Chronic, asymptomatic. Continue current dose beta blocker.    [name] [name], PAC

## 2012-12-05 NOTE — Assessment & Plan Note (Signed)
Chronic, asymptomatic. Continue current dose beta blocker.

## 2013-02-06 DIAGNOSIS — R079 Chest pain, unspecified: Secondary | ICD-10-CM

## 2013-02-06 DIAGNOSIS — I2510 Atherosclerotic heart disease of native coronary artery without angina pectoris: Secondary | ICD-10-CM

## 2013-02-06 DIAGNOSIS — I495 Sick sinus syndrome: Secondary | ICD-10-CM

## 2013-03-05 ENCOUNTER — Encounter: Payer: Self-pay | Admitting: *Deleted

## 2013-03-05 ENCOUNTER — Telehealth: Payer: Self-pay | Admitting: *Deleted

## 2013-03-05 DIAGNOSIS — E785 Hyperlipidemia, unspecified: Secondary | ICD-10-CM

## 2013-03-05 DIAGNOSIS — Z79899 Other long term (current) drug therapy: Secondary | ICD-10-CM

## 2013-03-05 DIAGNOSIS — I2510 Atherosclerotic heart disease of native coronary artery without angina pectoris: Secondary | ICD-10-CM

## 2013-03-05 NOTE — Telephone Encounter (Signed)
Message copied by Lesle Chris on Wed Mar 05, 2013  9:32 AM ------      Message from: Lesle Chris      Created: Thu Dec 05, 2012  1:39 PM       FLP, LFT - due May  ------

## 2013-03-05 NOTE — Telephone Encounter (Signed)
LETTER AND ORDERS MAILED TODAY.

## 2013-03-18 ENCOUNTER — Ambulatory Visit (INDEPENDENT_AMBULATORY_CARE_PROVIDER_SITE_OTHER): Payer: Medicaid Other | Admitting: Cardiology

## 2013-03-18 ENCOUNTER — Encounter: Payer: Self-pay | Admitting: Cardiology

## 2013-03-18 VITALS — BP 111/72 | HR 59 | Ht 64.0 in | Wt 220.1 lb

## 2013-03-18 DIAGNOSIS — F17200 Nicotine dependence, unspecified, uncomplicated: Secondary | ICD-10-CM

## 2013-03-18 DIAGNOSIS — I10 Essential (primary) hypertension: Secondary | ICD-10-CM

## 2013-03-18 DIAGNOSIS — I2510 Atherosclerotic heart disease of native coronary artery without angina pectoris: Secondary | ICD-10-CM

## 2013-03-18 DIAGNOSIS — E785 Hyperlipidemia, unspecified: Secondary | ICD-10-CM

## 2013-03-18 NOTE — Assessment & Plan Note (Signed)
Multivessel disease status post CABG and subsequent stent placement to the SVG to RCA. Documented to have occlusion of the SVG to RCA ultimately, and has been managed medically. Plan to continue current regimen and observation.

## 2013-03-18 NOTE — Assessment & Plan Note (Signed)
Blood pressure control is good today. 

## 2013-03-18 NOTE — Assessment & Plan Note (Signed)
Smoking cessation has been recommended, he has not been able to quit. 

## 2013-03-18 NOTE — Patient Instructions (Addendum)

## 2013-03-18 NOTE — Assessment & Plan Note (Signed)
-  Continue Pravachol ?

## 2013-03-18 NOTE — Progress Notes (Signed)
Clinical Summary [name] [name] is a 47 y.o.male presenting for office visit, a former patient of Dr. Andee Lineman. He has been managed medically for CAD, outlined below. Inpatient consultation at [facility] reviewed from May. At that time the patient presented after a single episode of chest pain, ruled out for myocardial infarction, no further testing was undertaken.  He reports no recurring angina symptoms. States that he was off of his heart medicines briefly since hospitalization, but has been back on them regularly.  We discussed the importance of regular compliance with medications, rationale for continuing medical therapy and observation.   No Known Allergies  Current Outpatient Prescriptions  Medication Sig Dispense Refill  . allopurinol (ZYLOPRIM) 300 MG tablet Take 300 mg by mouth daily.       Marland Kitchen aspirin EC 81 MG tablet Take 1 tablet (81 mg total) by mouth daily.      Marland Kitchen COLCRYS 0.6 MG tablet Take 0.6 mg by mouth 2 (two) times daily.       . diazepam (VALIUM) 5 MG tablet Take 5 mg by mouth 2 (two) times daily.        . indomethacin (INDOCIN) 50 MG capsule Take 50 mg by mouth 3 (three) times daily.      . isosorbide mononitrate (IMDUR) 60 MG 24 hr tablet Take 1 tablet (60 mg total) by mouth 2 (two) times daily.  180 tablet  3  . lisinopril (PRINIVIL,ZESTRIL) 2.5 MG tablet Take 1 tablet (2.5 mg total) by mouth 2 (two) times daily.  180 tablet  3  . metoprolol succinate (TOPROL-XL) 50 MG 24 hr tablet Take 1&1/2 tablets by mouth daily  135 tablet  3  . NITROSTAT 0.4 MG SL tablet PLACE 1 TABLET UNDER THE TONGUE EVERY 5 MINUTES AS NEEDED.  25 tablet  3  . pantoprazole (PROTONIX) 40 MG tablet Take 40 mg by mouth daily. Obtain further refills from primary MD       . pravastatin (PRAVACHOL) 80 MG tablet Take 1 tablet (80 mg total) by mouth every evening.  90 tablet  3  . ranolazine (RANEXA) 500 MG 12 hr tablet Take 1 tablet (500 mg total) by mouth 2 (two) times daily.  60 tablet  6  .  cyclobenzaprine (FLEXERIL) 10 MG tablet Take 10 mg by mouth 3 (three) times daily as needed.        No current facility-administered medications for this visit.    Past Medical History  Diagnosis Date  . Coronary atherosclerosis of native coronary artery     Multivessel s/p CABG, BMS SVG to RCA 2010 with NSTEMI, occluded SVG to RCA 03/2012  . Mixed hyperlipidemia   . Bipolar disorder   . GERD (gastroesophageal reflux disease)   . Gout   . Chronic back pain     Past Surgical History  Procedure Laterality Date  . Cholecystectomy    . Coronary artery bypass graft      2003, LIMA to LAD; SVG to diagonal; SVG to OM, ramus, and diagonal; SVG to RCA  . Pin placement left leg    . Metal plate to chin      Social History [name] [name] reports that he has been smoking Cigarettes.  He has a 26.4 pack-year smoking history. He has never used smokeless tobacco. [name] [name] reports that he does not drink alcohol.  Review of Systems No palpitations or syncope.  Physical Examination Filed Vitals:   03/18/13 1515  BP: 111/72  Pulse: 59   Filed Weights  03/18/13 1515  Weight: 220 lb 1.9 oz (99.846 kg)   Comfortable at rest. HEENT: Conjunctiva and lids normal, oropharynx clear. Neck: Supple, no elevated JVP or carotid bruits, no thyromegaly. Lungs: Clear to auscultation, nonlabored breathing at rest. Cardiac: Regular rate and rhythm, no S3, no pericardial rub. Abdomen: Soft, nontender, bowel sounds present. Extremities: No pitting edema, distal pulses 2+.   Problem List and Plan   CAD, NATIVE VESSEL Multivessel disease status post CABG and subsequent stent placement to the SVG to RCA. Documented to have occlusion of the SVG to RCA ultimately, and has been managed medically. Plan to continue current regimen and observation.  TOBACCO USER Smoking cessation has been recommended, he has not been able to quit.  Essential hypertension, benign Blood pressure control is good  today.  HYPERLIPIDEMIA-MIXED Continue Pravachol.    Jonelle Sidle, M.D., F.A.C.C.

## 2013-03-20 ENCOUNTER — Other Ambulatory Visit: Payer: Self-pay | Admitting: *Deleted

## 2013-03-20 MED ORDER — RANOLAZINE ER 500 MG PO TB12: 500.0000 mg | ORAL_TABLET | Freq: Two times a day (BID) | ORAL | Status: DC

## 2013-06-18 ENCOUNTER — Telehealth: Payer: Self-pay | Admitting: *Deleted

## 2013-06-18 NOTE — Telephone Encounter (Signed)
Informed patient that we received records from Dr. Jon Gills office visit from today and his HR was 52. Nurse reviewed previous office notes from our office and his HR was about the same as today's. Patient informed.

## 2013-06-18 NOTE — Telephone Encounter (Signed)
Spoke with patient and he said that he was informed by Dr. Jon Gills office that his heart rate was too low. Patient was unable to inform nurse what his heart rate was. Nurse advised patient that we needed to get exact numbers of heart rate and advised him to have pain clinic office send documented vitals to our office for further response. Patient informed nurse that he would be seeing his PCP on July 01, 2013. Nurse advised patient that if PCP felt he needed to be seen before December 2014 as planned, then PCP office would contact us with that request. Patient verbalized understanding of plan.

## 2013-07-13 ENCOUNTER — Inpatient Hospital Stay (HOSPITAL_COMMUNITY)
Admission: AD | Admit: 2013-07-13 | Discharge: 2013-07-15 | DRG: 247 | Disposition: A | Discharge status: Home / Self Care | Payer: PRIVATE HEALTH INSURANCE | Source: Other Acute Inpatient Hospital | Attending: Cardiology | Admitting: Cardiology

## 2013-07-13 ENCOUNTER — Encounter (HOSPITAL_COMMUNITY): Payer: Self-pay | Admitting: *Deleted

## 2013-07-13 DIAGNOSIS — F17200 Nicotine dependence, unspecified, uncomplicated: Secondary | ICD-10-CM

## 2013-07-13 DIAGNOSIS — I498 Other specified cardiac arrhythmias: Secondary | ICD-10-CM

## 2013-07-13 DIAGNOSIS — Z23 Encounter for immunization: Secondary | ICD-10-CM

## 2013-07-13 DIAGNOSIS — I2582 Chronic total occlusion of coronary artery: Secondary | ICD-10-CM | POA: Diagnosis present

## 2013-07-13 DIAGNOSIS — E785 Hyperlipidemia, unspecified: Secondary | ICD-10-CM

## 2013-07-13 DIAGNOSIS — I10 Essential (primary) hypertension: Secondary | ICD-10-CM | POA: Diagnosis present

## 2013-07-13 DIAGNOSIS — I214 Non-ST elevation (NSTEMI) myocardial infarction: Secondary | ICD-10-CM

## 2013-07-13 DIAGNOSIS — M109 Gout, unspecified: Secondary | ICD-10-CM | POA: Diagnosis present

## 2013-07-13 DIAGNOSIS — K219 Gastro-esophageal reflux disease without esophagitis: Secondary | ICD-10-CM | POA: Diagnosis present

## 2013-07-13 DIAGNOSIS — E782 Mixed hyperlipidemia: Secondary | ICD-10-CM | POA: Diagnosis present

## 2013-07-13 DIAGNOSIS — G8929 Other chronic pain: Secondary | ICD-10-CM | POA: Diagnosis present

## 2013-07-13 DIAGNOSIS — R001 Bradycardia, unspecified: Secondary | ICD-10-CM

## 2013-07-13 DIAGNOSIS — Z7982 Long term (current) use of aspirin: Secondary | ICD-10-CM

## 2013-07-13 DIAGNOSIS — I2589 Other forms of chronic ischemic heart disease: Secondary | ICD-10-CM | POA: Diagnosis present

## 2013-07-13 DIAGNOSIS — I200 Unstable angina: Secondary | ICD-10-CM

## 2013-07-13 DIAGNOSIS — I25810 Atherosclerosis of coronary artery bypass graft(s) without angina pectoris: Secondary | ICD-10-CM | POA: Diagnosis present

## 2013-07-13 DIAGNOSIS — Y832 Surgical operation with anastomosis, bypass or graft as the cause of abnormal reaction of the patient, or of later complication, without mention of misadventure at the time of the procedure: Secondary | ICD-10-CM | POA: Diagnosis present

## 2013-07-13 DIAGNOSIS — I2510 Atherosclerotic heart disease of native coronary artery without angina pectoris: Secondary | ICD-10-CM

## 2013-07-13 DIAGNOSIS — F411 Generalized anxiety disorder: Secondary | ICD-10-CM | POA: Diagnosis present

## 2013-07-13 DIAGNOSIS — I959 Hypotension, unspecified: Secondary | ICD-10-CM | POA: Diagnosis present

## 2013-07-13 DIAGNOSIS — Z955 Presence of coronary angioplasty implant and graft: Secondary | ICD-10-CM

## 2013-07-13 DIAGNOSIS — I255 Ischemic cardiomyopathy: Secondary | ICD-10-CM

## 2013-07-13 DIAGNOSIS — F319 Bipolar disorder, unspecified: Secondary | ICD-10-CM | POA: Diagnosis present

## 2013-07-13 DIAGNOSIS — M549 Dorsalgia, unspecified: Secondary | ICD-10-CM | POA: Diagnosis present

## 2013-07-13 HISTORY — DX: Bradycardia, unspecified: R00.1

## 2013-07-13 HISTORY — DX: Ischemic cardiomyopathy: I25.5

## 2013-07-13 LAB — TROPONIN I
Troponin I: <0.3 ng/mL (ref <0.30)
Troponin I: 0.39 ng/mL (ref <0.30)

## 2013-07-13 LAB — BASIC METABOLIC PANEL
Chloride: 103 mEq/L (ref 96–112)
Creatinine, Ser: 0.83 mg/dL (ref 0.50–1.35)
GFR calc Af Amer: >90 mL/min (ref >90)
Potassium: 4.7 mEq/L (ref 3.5–5.1)
Sodium: 140 mEq/L (ref 135–145)

## 2013-07-13 LAB — CBC
Hemoglobin: 13.1 g/dL (ref 13.0–17.0)
MCHC: 34.7 g/dL (ref 30.0–36.0)
MCHC: 34.1 g/dL (ref 30.0–36.0)
Platelets: 183 10*3/uL (ref 150–400)
RBC: 3.81 MIL/uL — ABNORMAL LOW (ref 4.22–5.81)
RDW: 13.8 % (ref 11.5–15.5)
RDW: 13.9 % (ref 11.5–15.5)
WBC: 8.3 10*3/uL (ref 4.0–10.5)

## 2013-07-13 LAB — LIPID PANEL
Cholesterol: 155 mg/dL (ref 0–200)
Total CHOL/HDL Ratio: 4.6 RATIO
Triglycerides: 218 mg/dL — ABNORMAL HIGH (ref <150)
VLDL: 44 mg/dL — ABNORMAL HIGH (ref 0–40)

## 2013-07-13 LAB — HEPARIN LEVEL (UNFRACTIONATED): Heparin Unfractionated: 0.17 IU/mL — ABNORMAL LOW (ref 0.30–0.70)

## 2013-07-13 LAB — PRO B NATRIURETIC PEPTIDE: Pro B Natriuretic peptide (BNP): 182.5 pg/mL — ABNORMAL HIGH (ref 0–125)

## 2013-07-13 MED ORDER — SIMVASTATIN 40 MG PO TABS
40.0000 mg | ORAL_TABLET | Freq: Every day | ORAL | Status: DC
  Filled 2013-07-13: qty 1

## 2013-07-13 MED ORDER — ONDANSETRON HCL 4 MG/2ML IJ SOLN: 4.0000 mg | Freq: Four times a day (QID) | INTRAMUSCULAR | Status: DC | PRN

## 2013-07-13 MED ORDER — SODIUM CHLORIDE 0.9 % IJ SOLN: 3.0000 mL | INTRAMUSCULAR | Status: DC | PRN

## 2013-07-13 MED ORDER — INDOMETHACIN 50 MG PO CAPS
50.0000 mg | ORAL_CAPSULE | Freq: Three times a day (TID) | ORAL | Status: DC
  Administered 2013-07-13 – 2013-07-14 (×6): 50 mg via ORAL
  Filled 2013-07-13 (×10): qty 1

## 2013-07-13 MED ORDER — HEPARIN (PORCINE) IN NACL 100-0.45 UNIT/ML-% IJ SOLN
1450.0000 [IU]/h | INTRAMUSCULAR | Status: DC
  Administered 2013-07-13: 1450 [IU]/h via INTRAVENOUS
  Filled 2013-07-13 (×2): qty 250

## 2013-07-13 MED ORDER — SODIUM CHLORIDE 0.9 % IJ SOLN
3.0000 mL | Freq: Two times a day (BID) | INTRAMUSCULAR | Status: DC
  Administered 2013-07-13 – 2013-07-14 (×2): 3 mL via INTRAVENOUS

## 2013-07-13 MED ORDER — MORPHINE SULFATE 4 MG/ML IJ SOLN
4.0000 mg | INTRAMUSCULAR | Status: AC | PRN
  Administered 2013-07-14: 4 mg via INTRAVENOUS
  Filled 2013-07-13: qty 1

## 2013-07-13 MED ORDER — DIAZEPAM 5 MG PO TABS
5.0000 mg | ORAL_TABLET | Freq: Two times a day (BID) | ORAL | Status: DC
  Administered 2013-07-13 – 2013-07-14 (×3): 5 mg via ORAL
  Filled 2013-07-13 (×3): qty 1

## 2013-07-13 MED ORDER — NITROGLYCERIN 0.4 MG SL SUBL: 0.4000 mg | SUBLINGUAL_TABLET | SUBLINGUAL | Status: DC | PRN

## 2013-07-13 MED ORDER — METOPROLOL SUCCINATE ER 50 MG PO TB24
50.0000 mg | ORAL_TABLET | Freq: Every day | ORAL | Status: DC
  Filled 2013-07-13: qty 1

## 2013-07-13 MED ORDER — PRAVASTATIN SODIUM 40 MG PO TABS
80.0000 mg | ORAL_TABLET | Freq: Every day | ORAL | Status: DC
  Administered 2013-07-13 – 2013-07-14 (×2): 80 mg via ORAL
  Filled 2013-07-13 (×3): qty 2

## 2013-07-13 MED ORDER — ASPIRIN EC 81 MG PO TBEC
81.0000 mg | DELAYED_RELEASE_TABLET | Freq: Every day | ORAL | Status: DC
  Administered 2013-07-14 – 2013-07-15 (×2): 81 mg via ORAL
  Filled 2013-07-13 (×2): qty 1

## 2013-07-13 MED ORDER — NITROGLYCERIN IN D5W 200-5 MCG/ML-% IV SOLN
3.0000 ug/min | INTRAVENOUS | Status: DC
  Administered 2013-07-13: 20 ug/min via INTRAVENOUS

## 2013-07-13 MED ORDER — RANOLAZINE ER 500 MG PO TB12
500.0000 mg | ORAL_TABLET | Freq: Two times a day (BID) | ORAL | Status: DC
Start: 2013-07-13 — End: 2013-07-15
  Administered 2013-07-13 – 2013-07-15 (×5): 500 mg via ORAL
  Filled 2013-07-13 (×6): qty 1

## 2013-07-13 MED ORDER — PNEUMOCOCCAL VAC POLYVALENT 25 MCG/0.5ML IJ INJ
0.5000 mL | INJECTION | INTRAMUSCULAR | Status: AC
  Administered 2013-07-14: 0.5 mL via INTRAMUSCULAR
  Filled 2013-07-13: qty 0.5

## 2013-07-13 MED ORDER — METOPROLOL SUCCINATE ER 50 MG PO TB24
50.0000 mg | ORAL_TABLET | Freq: Every day | ORAL | Status: DC
  Administered 2013-07-14: 50 mg via ORAL
  Filled 2013-07-13 (×3): qty 1

## 2013-07-13 MED ORDER — PANTOPRAZOLE SODIUM 40 MG PO TBEC
40.0000 mg | DELAYED_RELEASE_TABLET | Freq: Every day | ORAL | Status: DC
  Administered 2013-07-13 – 2013-07-15 (×3): 40 mg via ORAL
  Filled 2013-07-13 (×2): qty 1

## 2013-07-13 MED ORDER — SODIUM CHLORIDE 0.9 % IV SOLN
1.0000 mL/kg/h | INTRAVENOUS | Status: DC
  Administered 2013-07-14: 1 mL/kg/h via INTRAVENOUS

## 2013-07-13 MED ORDER — ACETAMINOPHEN 325 MG PO TABS: 650.0000 mg | ORAL_TABLET | ORAL | Status: DC | PRN

## 2013-07-13 MED ORDER — SODIUM CHLORIDE 0.9 % IV BOLUS (SEPSIS)
250.0000 mL | Freq: Once | INTRAVENOUS | Status: AC
  Administered 2013-07-13: 250 mL via INTRAVENOUS

## 2013-07-13 MED ORDER — CYCLOBENZAPRINE HCL 10 MG PO TABS
10.0000 mg | ORAL_TABLET | Freq: Three times a day (TID) | ORAL | Status: DC | PRN
  Administered 2013-07-13: 10 mg via ORAL
  Filled 2013-07-13: qty 1

## 2013-07-13 MED ORDER — HEPARIN BOLUS VIA INFUSION
2500.0000 [IU] | Freq: Once | INTRAVENOUS | Status: AC
  Administered 2013-07-13: 2500 [IU] via INTRAVENOUS
  Filled 2013-07-13: qty 2500

## 2013-07-13 MED ORDER — HEPARIN (PORCINE) IN NACL 100-0.45 UNIT/ML-% IJ SOLN
1150.0000 [IU]/h | INTRAMUSCULAR | Status: DC
  Administered 2013-07-13: 1150 [IU]/h via INTRAVENOUS

## 2013-07-13 MED ORDER — INFLUENZA VAC SPLIT QUAD 0.5 ML IM SUSP
0.5000 mL | INTRAMUSCULAR | Status: AC
Start: 2013-07-14 — End: 2013-07-14
  Administered 2013-07-14: 0.5 mL via INTRAMUSCULAR
  Filled 2013-07-13: qty 0.5

## 2013-07-13 MED ORDER — LISINOPRIL 2.5 MG PO TABS
2.5000 mg | ORAL_TABLET | Freq: Two times a day (BID) | ORAL | Status: DC
  Administered 2013-07-13 – 2013-07-15 (×5): 2.5 mg via ORAL
  Filled 2013-07-13 (×6): qty 1

## 2013-07-13 MED ORDER — CLOPIDOGREL BISULFATE 75 MG PO TABS
75.0000 mg | ORAL_TABLET | Freq: Every day | ORAL | Status: DC
  Administered 2013-07-13 – 2013-07-15 (×3): 75 mg via ORAL
  Filled 2013-07-13 (×3): qty 1

## 2013-07-13 MED ORDER — SODIUM CHLORIDE 0.9 % IV SOLN: 250.0000 mL | INTRAVENOUS | Status: DC | PRN

## 2013-07-13 MED ORDER — ISOSORBIDE MONONITRATE ER 60 MG PO TB24
60.0000 mg | ORAL_TABLET | Freq: Two times a day (BID) | ORAL | Status: DC
  Filled 2013-07-13 (×3): qty 1

## 2013-07-13 MED ORDER — ALLOPURINOL 300 MG PO TABS
300.0000 mg | ORAL_TABLET | Freq: Every day | ORAL | Status: DC
  Administered 2013-07-13 – 2013-07-15 (×3): 300 mg via ORAL
  Filled 2013-07-13 (×3): qty 1

## 2013-07-13 NOTE — Progress Notes (Signed)
     TELEMETRY: Reviewed telemetry pt in sinus brady rate 49 bpm: Filed Vitals:   07/13/13 0500 07/13/13 0530 07/13/13 0600 07/13/13 0700  BP: 97/70 88/50 98/54  94/71  Pulse: 56 49 58 47  Temp:      TempSrc:      Resp: 13 14 12 11   Height:      Weight:      SpO2: 93% 93% 91% 95%    Intake/Output Summary (Last 24 hours) at 07/13/13 0757 Last data filed at 07/13/13 0700  Gross per 24 hour  Intake  47.54 ml  Output    400 ml  Net -352.46 ml    SUBJECTIVE Feels well today. No active chest pain or SOB. Ntg drip stopped due to hypotension.  LABS: Basic Metabolic Panel:  Recent Labs  56/21/30 0525  NA 140  K 4.7  CL 103  CO2 26  GLUCOSE 112*  BUN 8  CREATININE 0.83  CALCIUM 8.4   CBC:  Recent Labs  07/13/13 0525  WBC 8.3  HGB 13.3  HCT 38.3*  MCV 99.7  PLT 183   Cardiac Enzymes:  Recent Labs  07/13/13 0518  TROPONINI <0.30   BNP: 182.5  Fasting Lipid Panel:  Recent Labs  07/13/13 0525  CHOL 155  HDL 34*  LDLCALC 77  TRIG 865*  CHOLHDL 4.6    Radiology/Studies:  CXR at [facility] shows no acute change.  Ecg: sinus brady, Old inferior infarct. ST-T abnormality c/w lateral ischemia.  PHYSICAL EXAM General: Well developed, obese, in no acute distress. Head: Normal Neck: Negative for carotid bruits. JVD not elevated. Lungs: bibasilar crackles. Breathing is unlabored. Heart: RRR S1 S2 without murmurs, rubs, or gallops.  Abdomen: Soft, non-tender, non-distended with normoactive bowel sounds. No hepatomegaly. Msk:  Strength and tone appears normal for age. Extremities: No clubbing, cyanosis or edema.  Distal pedal pulses are 2+ and equal bilaterally. Neuro: Alert and oriented X 3. Moves all extremities spontaneously. Psych:  Responds to questions appropriately with a normal affect.  ASSESSMENT AND PLAN: 1. Unstable angina. Troponins are negative. Ecg shows lateral ischemia. Currently pain free. Plan cardiac cath in am. Ntg DC'd due to  hypotension. On IV heparin. 2. Hypotension. Will bolus with IV NS. Hold parameters written for beta blocker and lisinopril. 3. CAD s/p CABG. Known occlusion of SVG to RCA. 4. Bipolar disorder. 5. Dyslipidemia 6. Tobacco abuse.  Principal Problem:   Unstable angina Active Problems:   HYPERLIPIDEMIA-MIXED   TOBACCO USER   CAD, NATIVE VESSEL   Essential hypertension, benign    Signed, Peter [place] MD,FACC 07/13/2013 7:57 AM

## 2013-07-13 NOTE — Progress Notes (Signed)
ANTICOAGULATION CONSULT NOTE - Follow Up Consult  Pharmacy Consult for Heparin Indication: chest pain/ACS  No Known Allergies  Patient Measurements: Height: 5\' 4"  (162.6 cm) Weight: 219 lb 12.8 oz (99.7 kg) IBW/kg (Calculated) : 59.2 Heparin Dosing Weight: 81.7kg  Vital Signs: Temp: 97.8 F (36.6 C) (10/05 0700) Temp src: Oral (10/05 0700) BP: 94/71 mmHg (10/05 0700) Pulse Rate: 47 (10/05 0700)  Labs:  Recent Labs  07/13/13 0518 07/13/13 0525 07/13/13 0955  HGB  --  13.3 13.1  HCT  --  38.3* 38.4*  PLT  --  183 168  HEPARINUNFRC  --   --  0.17*  CREATININE  --  0.83  --   TROPONINI <0.30  --   --     Estimated Creatinine Clearance: 118.6 ml/min (by C-G formula based on Cr of 0.83).   Medications:  Heparin @ 1150 units/hr  Assessment: 47yom continues on heparin for chest pain with plans for cath tomorrow. Initial heparin level is low at 0.17. No issues with infusion. CBC is stable. No bleeding reported.  Goal of Therapy:  Heparin level 0.3-0.7 units/ml Monitor platelets by anticoagulation protocol: Yes   Plan:  1) Re-bolus heparin 2500 units x 1 then increase drip to 1450 units/hr 2) 6 hour heparin level after rate change  Fredrik Rigger 07/13/2013,10:45 AM

## 2013-07-13 NOTE — H&P (Signed)
[name] [name] is an 47 y.o. male.   Chief Complaint: Chest pain  HPI: [name] [name] presents as a transfer from [facility] hospital with chest pain. [name] [name] has severe coronary artery disease and needed bypass surgery in 2003. [name] [name] has been having worsening dyspnea with exertion over the past several days. [name] [name] has been having more chest pain with exertion as well. [name] [name] now has CCS class 4 symptoms. Even at his baseline functional capacity it sounds like [name] [name] has about class 2 symptoms. This evening [name] [name] had sudden onset of severe substernal chest pain. [name] [name] was nauseated as well as lightheaded and dizzy. [name] [name] has not had similar symptoms since before his bypass surgery. Sublingual nitroglycerin did not help. [name] [name] went to the ED. [name] [name] was found to have 1mm ST depression in the inferior and lateral leads. [name] [name] was started on IV nitro and heparin drips. [name] [name] was transferred here for further management. Since [name] [name] arrived [name] [name] has been chest pain free. [name] [name] tells me that [name] [name] continues to smoke about 1 pack of cigarettes per day. [name] [name] denies any lower extremity edema. [name] [name] has been compliant with his home medications.   His last left heart cath was in June of 2013 and found: Graft Anatomy:  SVG to RCA is occluded at the ostium  SVG sequential to OM, intermediate and Diagonal is patent.  LIMA to mid LAD is patent.   1. Severe native vessel CAD s/p 5V CABG with 4/5 patent bypass grafts.  2. Angina likely secondary to occlusion of SVG to RCA, however, distal RCA now fills from left to right collaterals.   [name] [name] has been medically managed since that time. [name] [name] is known to have a mildly depressed LV systolic function.     Past Medical History  Diagnosis Date  . Coronary atherosclerosis of native coronary artery     Multivessel s/p CABG, BMS SVG to RCA 2010 with NSTEMI, occluded SVG to RCA 03/2012  . Mixed hyperlipidemia   . Bipolar disorder   . GERD (gastroesophageal reflux disease)   . Gout   . Chronic back pain     Past Surgical History   Procedure Laterality Date  . Cholecystectomy    . Coronary artery bypass graft      2003, LIMA to LAD; SVG to diagonal; SVG to OM, ramus, and diagonal; SVG to RCA  . Pin placement left leg    . Metal plate to chin      Family History  Problem Relation Age of Onset  . Stroke      family Hx    Social History:  reports that [name] [name] has been smoking Cigarettes.  [name] [name] has a 26.4 pack-year smoking history. [name] [name] has never used smokeless tobacco. [name] [name] reports that [name] [name] uses illicit drugs. [name] [name] reports that [name] [name] does not drink alcohol.  Allergies: No Known Allergies  Medications Prior to Admission  Medication Sig Dispense Refill  . allopurinol (ZYLOPRIM) 300 MG tablet Take 300 mg by mouth daily.       Marland Kitchen aspirin EC 81 MG tablet Take 1 tablet (81 mg total) by mouth daily.      Marland Kitchen COLCRYS 0.6 MG tablet Take 0.6 mg by mouth 2 (two) times daily.       . cyclobenzaprine (FLEXERIL) 10 MG tablet Take 10 mg by mouth 3 (three) times daily as needed.       . diazepam (VALIUM) 5 MG tablet Take 5 mg by mouth 2 (two) times daily.        Marland Kitchen  indomethacin (INDOCIN) 50 MG capsule Take 50 mg by mouth 3 (three) times daily.      . isosorbide mononitrate (IMDUR) 60 MG 24 hr tablet Take 1 tablet (60 mg total) by mouth 2 (two) times daily.  180 tablet  3  . lisinopril (PRINIVIL,ZESTRIL) 2.5 MG tablet Take 1 tablet (2.5 mg total) by mouth 2 (two) times daily.  180 tablet  3  . metoprolol succinate (TOPROL-XL) 50 MG 24 hr tablet Take 1&1/2 tablets by mouth daily  135 tablet  3  . NITROSTAT 0.4 MG SL tablet PLACE 1 TABLET UNDER THE TONGUE EVERY 5 MINUTES AS NEEDED.  25 tablet  3  . pantoprazole (PROTONIX) 40 MG tablet Take 40 mg by mouth daily. Obtain further refills from primary MD       . pravastatin (PRAVACHOL) 80 MG tablet Take 1 tablet (80 mg total) by mouth every evening.  90 tablet  3  . ranolazine (RANEXA) 500 MG 12 hr tablet Take 1 tablet (500 mg total) by mouth 2 (two) times daily.  60 tablet  6    No results found for this  or any previous visit (from the past 48 hour(s)). No results found.  Review of Systems  Respiratory: Positive for shortness of breath.   Cardiovascular: Positive for chest pain, orthopnea and PND.  All other systems reviewed and are negative.    Blood pressure 113/79, pulse 54, resp. rate 12, height 5\' 4"  (1.626 m), weight 219 lb 12.8 oz (99.7 kg), SpO2 100.00%. Physical Exam  Nursing note and vitals reviewed. Constitutional: [name] [name] is oriented to person, place, and time. No distress.  Neck: No JVD present. No thyromegaly present.  Cardiovascular: Normal rate, normal heart sounds and intact distal pulses.   No murmur heard. Respiratory: [name] [name] has rales.  Musculoskeletal: [name] [name] exhibits no edema.  Neurological: [name] [name] is alert and oriented to person, place, and time.  Skin: [name] [name] is not diaphoretic.    ECG shows resolution of his ST depression. Sinus rhythm   Assessment/Plan  [name] [name] is an unfortunate individual with severe CAD and prior CABG who presents with typical angina and NSTEMI. His ischemic ECG changes have resolved and [name] [name] is currently chest pain free.   NSTEMI- Troponin was not elevated at [facility]. His ECG was consistent with ischemia, although the changes have resolved with medical management. Will continue aspirin, plavix and heparin drip. Will continue the nitroglycerin drip as well. We can add morphine as well. If [name] [name] develops refractory pain, [name] [name] would need more urgent catheterization. Right now, we can wait until the morning. In the interim we will follow his troponin and check PRN ECG. His heart rate is too low to allow for beta-blockade. His blood pressure is at goal.   Dyslipidemia- continue home statin  Anxiety- [name] [name] is chronically on valium, so I will continue this  Tobacco abuse- [name] [name] has continued to smoke heavily. [name] [name] doesn't seem motivated to quit. His wife still smokes at home which isn't helping. Counseling.   CAD- continue aspirin, and statin  Diet- clears  Full  Code   Serafino Burciaga C 07/13/2013, 3:21 AM

## 2013-07-13 NOTE — Progress Notes (Signed)
ANTICOAGULATION CONSULT NOTE - Initial Consult  Pharmacy Consult for heparin Indication: chest pain/ACS  No Known Allergies  Patient Measurements: Height: 5\' 4"  (162.6 cm) Weight: 219 lb 12.8 oz (99.7 kg) IBW/kg (Calculated) : 59.2 Heparin Dosing Weight: 76 kg  Vital Signs: BP: 113/79 mmHg (10/05 0246) Pulse Rate: 54 (10/05 0250)  Labs: No results found for this basename: HGB, HCT, PLT, APTT, LABPROT, INR, HEPARINUNFRC, CREATININE, CKTOTAL, CKMB, TROPONINI,  in the last 72 hours  Estimated Creatinine Clearance: 94.7 ml/min (by C-G formula based on Cr of 1.04).   Medical History: Past Medical History  Diagnosis Date  . Coronary atherosclerosis of native coronary artery     Multivessel s/p CABG, BMS SVG to RCA 2010 with NSTEMI, occluded SVG to RCA 03/2012  . Mixed hyperlipidemia   . Bipolar disorder   . GERD (gastroesophageal reflux disease)   . Gout   . Chronic back pain     Medications:  Prescriptions prior to admission  Medication Sig Dispense Refill  . allopurinol (ZYLOPRIM) 300 MG tablet Take 300 mg by mouth daily.       Marland Kitchen aspirin EC 81 MG tablet Take 1 tablet (81 mg total) by mouth daily.      Marland Kitchen COLCRYS 0.6 MG tablet Take 0.6 mg by mouth 2 (two) times daily.       . cyclobenzaprine (FLEXERIL) 10 MG tablet Take 10 mg by mouth 3 (three) times daily as needed.       . diazepam (VALIUM) 5 MG tablet Take 5 mg by mouth 2 (two) times daily.        . indomethacin (INDOCIN) 50 MG capsule Take 50 mg by mouth 3 (three) times daily.      . isosorbide mononitrate (IMDUR) 60 MG 24 hr tablet Take 1 tablet (60 mg total) by mouth 2 (two) times daily.  180 tablet  3  . lisinopril (PRINIVIL,ZESTRIL) 2.5 MG tablet Take 1 tablet (2.5 mg total) by mouth 2 (two) times daily.  180 tablet  3  . metoprolol succinate (TOPROL-XL) 50 MG 24 hr tablet Take 1&1/2 tablets by mouth daily  135 tablet  3  . NITROSTAT 0.4 MG SL tablet PLACE 1 TABLET UNDER THE TONGUE EVERY 5 MINUTES AS NEEDED.  25 tablet   3  . pantoprazole (PROTONIX) 40 MG tablet Take 40 mg by mouth daily. Obtain further refills from primary MD       . pravastatin (PRAVACHOL) 80 MG tablet Take 1 tablet (80 mg total) by mouth every evening.  90 tablet  3  . ranolazine (RANEXA) 500 MG 12 hr tablet Take 1 tablet (500 mg total) by mouth 2 (two) times daily.  60 tablet  6    Assessment: 47 yo man transferred from [facility] to continue heparin for CP.  He received 4000 unit bolus and drip at 1000 units/hr at ~00:30.   Goal of Therapy:  Heparin level 0.3-0.7 units/ml Monitor platelets by anticoagulation protocol: Yes   Plan:  Increase heparin drip to 1150 units/hr Check heparin level and CBC 6 hours after rate change and daily while on heparin.    Sheliah Fiorillo Poteet 07/13/2013,3:33 AM

## 2013-07-13 NOTE — Progress Notes (Signed)
ANTICOAGULATION CONSULT NOTE - Follow Up Consult  Pharmacy Consult for Heparin Indication: chest pain/ACS  No Known Allergies  Patient Measurements: Height: 5\' 4"  (162.6 cm) Weight: 219 lb 12.8 oz (99.7 kg) IBW/kg (Calculated) : 59.2 Heparin Dosing Weight: 81.7kg  Vital Signs: Temp: 97.9 F (36.6 C) (10/05 1600) Temp src: Oral (10/05 1600) BP: 94/71 mmHg (10/05 0700) Pulse Rate: 47 (10/05 0700)  Labs:  Recent Labs  07/13/13 0518 07/13/13 0525 07/13/13 0945 07/13/13 0955 07/13/13 1700  HGB  --  13.3  --  13.1  --   HCT  --  38.3*  --  38.4*  --   PLT  --  183  --  168  --   HEPARINUNFRC  --   --   --  0.17* 0.46  CREATININE  --  0.83  --   --   --   TROPONINI <0.30  --  0.39*  --   --     Estimated Creatinine Clearance: 118.6 ml/min (by C-G formula based on Cr of 0.83).   Medications:  Heparin @ 1450 units/hr  Assessment: 47yom continues on heparin for chest pain with plans for cath tomorrow. Heparin level is now therapeutic. No issues with infusion. CBC is stable. No bleeding reported.  Goal of Therapy:  Heparin level 0.3-0.7 units/ml Monitor platelets by anticoagulation protocol: Yes   Plan:  1) Continue IV heparin at current rate. 2) Recheck heparin level with AM labs. 3) Daily CBC and heparin level.  Tad Moore, BCPS  Clinical Pharmacist Pager [phone]  07/13/2013 5:48 PM

## 2013-07-14 ENCOUNTER — Encounter (HOSPITAL_COMMUNITY)
Admission: AD | Disposition: A | Discharge status: Home / Self Care | Payer: Self-pay | Source: Other Acute Inpatient Hospital | Attending: Cardiology

## 2013-07-14 DIAGNOSIS — I25810 Atherosclerosis of coronary artery bypass graft(s) without angina pectoris: Secondary | ICD-10-CM

## 2013-07-14 DIAGNOSIS — I214 Non-ST elevation (NSTEMI) myocardial infarction: Secondary | ICD-10-CM

## 2013-07-14 HISTORY — PX: PERCUTANEOUS CORONARY STENT INTERVENTION (PCI-S): SHX5485

## 2013-07-14 HISTORY — PX: LEFT HEART CATHETERIZATION WITH CORONARY/GRAFT ANGIOGRAM: SHX5450

## 2013-07-14 LAB — CBC
Hemoglobin: 13.2 g/dL (ref 13.0–17.0)
MCH: 33.6 pg (ref 26.0–34.0)
MCHC: 34.1 g/dL (ref 30.0–36.0)
MCV: 98.5 fL (ref 78.0–100.0)
Platelets: 162 10*3/uL (ref 150–400)
RBC: 3.93 MIL/uL — ABNORMAL LOW (ref 4.22–5.81)
WBC: 5.4 10*3/uL (ref 4.0–10.5)

## 2013-07-14 SURGERY — LEFT HEART CATHETERIZATION WITH CORONARY/GRAFT ANGIOGRAM
Anesthesia: LOCAL

## 2013-07-14 MED ORDER — MUPIROCIN 2 % EX OINT
1.0000 "application " | TOPICAL_OINTMENT | Freq: Two times a day (BID) | CUTANEOUS | Status: DC
  Administered 2013-07-14 (×2): 1 via NASAL
  Filled 2013-07-14: qty 22

## 2013-07-14 MED ORDER — ALUM & MAG HYDROXIDE-SIMETH 200-200-20 MG/5ML PO SUSP
30.0000 mL | ORAL | Status: DC | PRN
  Administered 2013-07-14: 30 mL via ORAL
  Filled 2013-07-14: qty 30

## 2013-07-14 MED ORDER — CHLORHEXIDINE GLUCONATE CLOTH 2 % EX PADS
6.0000 | MEDICATED_PAD | Freq: Every day | CUTANEOUS | Status: DC
  Administered 2013-07-15: 6 via TOPICAL

## 2013-07-14 MED ORDER — FAMOTIDINE 200 MG/20ML IV SOLN: 20.0000 mg/h | INTRAVENOUS | Status: DC

## 2013-07-14 MED ORDER — BIVALIRUDIN 250 MG IV SOLR
INTRAVENOUS | Status: AC
  Filled 2013-07-14: qty 250

## 2013-07-14 MED ORDER — MIDAZOLAM HCL 2 MG/2ML IJ SOLN
INTRAMUSCULAR | Status: AC
  Filled 2013-07-14: qty 2

## 2013-07-14 MED ORDER — FAMOTIDINE IN NACL 20-0.9 MG/50ML-% IV SOLN
20.0000 mg | Freq: Once | INTRAVENOUS | Status: AC
  Administered 2013-07-14: 14:00:00 20 mg via INTRAVENOUS
  Filled 2013-07-14 (×2): qty 50

## 2013-07-14 MED ORDER — LIDOCAINE HCL (PF) 1 % IJ SOLN
INTRAMUSCULAR | Status: AC
  Filled 2013-07-14: qty 30

## 2013-07-14 MED ORDER — SODIUM CHLORIDE 0.9 % IV SOLN
1.0000 mL/kg/h | INTRAVENOUS | Status: AC
  Administered 2013-07-14: 1 mL/kg/h via INTRAVENOUS

## 2013-07-14 MED ORDER — FENTANYL CITRATE 0.05 MG/ML IJ SOLN
INTRAMUSCULAR | Status: AC
  Filled 2013-07-14: qty 2

## 2013-07-14 MED ORDER — HEPARIN (PORCINE) IN NACL 2-0.9 UNIT/ML-% IJ SOLN
INTRAMUSCULAR | Status: AC
  Filled 2013-07-14: qty 1000

## 2013-07-14 MED ORDER — HYDRALAZINE HCL 20 MG/ML IJ SOLN
10.0000 mg | Freq: Once | INTRAMUSCULAR | Status: AC
  Administered 2013-07-14: 14:00:00 10 mg via INTRAVENOUS
  Filled 2013-07-14: qty 1

## 2013-07-14 MED ORDER — CLOPIDOGREL BISULFATE 300 MG PO TABS
ORAL_TABLET | ORAL | Status: AC
  Filled 2013-07-14: qty 2

## 2013-07-14 MED ORDER — NITROGLYCERIN 0.2 MG/ML ON CALL CATH LAB
INTRAVENOUS | Status: AC
  Filled 2013-07-14: qty 1

## 2013-07-14 NOTE — H&P (View-Only) (Signed)
     TELEMETRY: Reviewed telemetry pt in sinus brady rate 45-60 bpm: Filed Vitals:   07/14/13 0631 07/14/13 0800 07/14/13 0857 07/14/13 0858  BP: 105/62 127/77 127/77 127/77  Pulse: 57 53 52   Temp: 98.2 F (36.8 C) 98.4 F (36.9 C)    TempSrc: Oral Oral    Resp: 18 16    Height:      Weight:      SpO2: 95% 92%      Intake/Output Summary (Last 24 hours) at 07/14/13 0931 Last data filed at 07/14/13 0900  Gross per 24 hour  Intake 1533.5 ml  Output   1750 ml  Net -216.5 ml    SUBJECTIVE Feels well today. No active chest pain or SOB.  LABS: Basic Metabolic Panel:  Recent Labs  16/10/96 0525  NA 140  K 4.7  CL 103  CO2 26  GLUCOSE 112*  BUN 8  CREATININE 0.83  CALCIUM 8.4   CBC:  Recent Labs  07/13/13 0955 07/14/13 0510  WBC 7.5 5.4  HGB 13.1 13.2  HCT 38.4* 38.7*  MCV 100.8* 98.5  PLT 168 162   Cardiac Enzymes:  Recent Labs  07/13/13 0518 07/13/13 0945 07/13/13 1748  TROPONINI <0.30 0.39* 1.07*   BNP: 182.5  Fasting Lipid Panel:  Recent Labs  07/13/13 0525  CHOL 155  HDL 34*  LDLCALC 77  TRIG 045*  CHOLHDL 4.6    Radiology/Studies:  CXR at [facility] shows no acute change.  Ecg: sinus brady, Old inferior infarct. ST-T abnormality c/w lateral ischemia.  PHYSICAL EXAM General: Well developed, obese, in no acute distress. Head: Normal Neck: Negative for carotid bruits. JVD not elevated. Lungs: bibasilar crackles. Breathing is unlabored. Heart: RRR S1 S2 without murmurs, rubs, or gallops.  Abdomen: Soft, non-tender, non-distended with normoactive bowel sounds. No hepatomegaly. Msk:  Strength and tone appears normal for age. Extremities: No clubbing, cyanosis or edema.  Distal pedal pulses are 2+ and equal bilaterally. Neuro: Alert and oriented X 3. Moves all extremities spontaneously. Psych:  Responds to questions appropriately with a normal affect.  ASSESSMENT AND PLAN: 1. NSTEMI. Troponins are positive. Ecg shows lateral  ischemia. Currently pain free. Plan cardiac cath today.  On IV heparin. The procedure and risks were reviewed including but not limited to death, myocardial infarction, stroke, arrythmias, bleeding, transfusion, emergency surgery, dye allergy, or renal dysfunction. The patient voices understanding and is agreeable to proceed. 2. Hypotension. Improved. ACEi held last pm 3. CAD s/p CABG. Known occlusion of SVG to RCA. 4. Bipolar disorder. 5. Dyslipidemia 6. Tobacco abuse.  Principal Problem:   Unstable angina Active Problems:   HYPERLIPIDEMIA-MIXED   TOBACCO USER   CAD, NATIVE VESSEL   Essential hypertension, benign    Signed, Jazalyn Mondor [place] MD,FACC 07/14/2013 9:31 AM

## 2013-07-14 NOTE — Progress Notes (Signed)
ANTICOAGULATION CONSULT NOTE - Follow Up Consult  Pharmacy Consult for Heparin Indication: chest pain/ACS  No Known Allergies  Patient Measurements: Height: 5\' 4"  (162.6 cm) Weight: 219 lb 12.8 oz (99.7 kg) IBW/kg (Calculated) : 59.2 Heparin Dosing Weight: 81.7kg  Vital Signs: Temp: 98.2 F (36.8 C) (10/06 0631) Temp src: Oral (10/06 0631) BP: 127/77 mmHg (10/06 0858) Pulse Rate: 52 (10/06 0857)  Labs:  Recent Labs  07/13/13 0518  07/13/13 0525 07/13/13 0945 07/13/13 0955 07/13/13 1700 07/13/13 1748 07/14/13 0510  HGB  --   < > 13.3  --  13.1  --   --  13.2  HCT  --   --  38.3*  --  38.4*  --   --  38.7*  PLT  --   --  183  --  168  --   --  162  HEPARINUNFRC  --   --   --   --  0.17* 0.46  --  0.40  CREATININE  --   --  0.83  --   --   --   --   --   TROPONINI <0.30  --   --  0.39*  --   --  1.07*  --   < > = values in this interval not displayed.  Estimated Creatinine Clearance: 118.6 ml/min (by C-G formula based on Cr of 0.83).   Medications:  Heparin @ 1450 units/hr  Assessment: 47 yo m continues on heparin for chest pain with plans for cath this AM.  Heparin level is therapeutic. No issues with infusion. CBC is stable. No bleeding reported.  Goal of Therapy:  Heparin level 0.3-0.7 units/ml Monitor platelets by anticoagulation protocol: Yes   Plan:  1) Continue IV heparin at current rate. 2) Follow-up after cath.   [employer], Pharm.D., BCPS Clinical Pharmacist Pager [phone] 07/14/2013 9:01 AM

## 2013-07-14 NOTE — Progress Notes (Signed)
Utilization Review Completed.[name] [name] T10/03/2013

## 2013-07-14 NOTE — Interval H&P Note (Signed)
History and Physical Interval Note:  07/14/2013 10:20 AM  [name] [name]  has presented today for surgery, with the diagnosis of cp  The various methods of treatment have been discussed with the patient and family. After consideration of risks, benefits and other options for treatment, the patient has consented to  Procedure(s): LEFT HEART CATHETERIZATION WITH CORONARY/GRAFT ANGIOGRAM (N/A) as a surgical intervention .  The patient's history has been reviewed, patient examined, no change in status, stable for surgery.  I have reviewed the patient's chart and labs.  Questions were answered to the patient's satisfaction.    Cath Lab Visit (complete for each Cath Lab visit)  Clinical Evaluation Leading to the Procedure:   ACS: yes  Non-ACS:    Anginal Classification: CCS III  Anti-ischemic medical therapy: Maximal Therapy (2 or more classes of medications)  Non-Invasive Test Results: No non-invasive testing performed  Prior CABG: Previous CABG       Theron Arista [facility] 07/14/2013 10:20 AM

## 2013-07-14 NOTE — Progress Notes (Signed)
Site area: right groin  Site Prior to Removal:  Level 0  Pressure Applied For 20 MINUTES    Minutes Beginning at 1440  Manual:   yes  Patient Status During Pull:  stable  Post Pull Groin Site:  Level 0  Post Pull Instructions Given:  yes  Post Pull Pulses Present:  yes  Dressing Applied:  yes  Comments:   

## 2013-07-14 NOTE — CV Procedure (Signed)
Cardiac Catheterization Procedure Note  Name: [name] [name] MRN: [id number] DOB: 10-11-1965  Procedure: Left Heart Cath, Selective Coronary Angiography, SVG angiography, LIMA angiography, LV angiography,  PTCA/Stent of SVG to OMs.  Indication: 47 year old white male with history of coronary disease status post CABG. He has known occlusion of the native right coronary and the saphenous vein graft to the right coronary. He presents now with a non-ST elevation myocardial infarction with class IV angina.   Diagnostic Procedure Details: The right groin was prepped, draped, and anesthetized with 1% lidocaine. Using the modified Seldinger technique, a 5 French sheath was introduced into the right femoral artery. Standard Judkins catheters were used for selective coronary angiography and left ventriculography. Catheter exchanges were performed over a wire.  The diagnostic procedure was well-tolerated without immediate complications.  PROCEDURAL FINDINGS Hemodynamics: AO 143/88 with a mean of 113 mmHg LV 143/27 mmHg  Coronary angiography: Coronary dominance: right  Left mainstem: The left main coronary is long. It is small in caliber and diffusely diseased.  Left anterior descending (LAD): The LAD is occluded at the ostium.  Ramus intermediate: There is a 90-95% stenosis at the origin of the ramus branch.  Left circumflex (LCx): The left circumflex is diffusely diseased. The first obtuse marginal vessel has a 90% stenosis in the mid vessel prior to the graft insertion.  Right coronary artery (RCA): The right coronary is occluded in the mid vessel. There are left to right collaterals from the left coronary both from the left circumflex and LAD distribution.  The LIMA graft to the LAD is widely patent.  The saphenous vein graft to the right coronary is occluded.  There is a saphenous vein graft filling sequentially the first diagonal, ramus intermediate branch, and first obtuse marginal  vessel. There is a focal 90% stenosis in the proximal graft. The remainder of the vein graft is without significant disease.  Left ventriculography: Left ventricular systolic function is abnormal, LVEF is estimated at 35-40%, there is severe hypokinesis of the mid to distal anterior wall and inferior basal wall. There is no significant mitral regurgitation   PCI Procedure Note:  Following the diagnostic procedure, the decision was made to proceed with PCI. The sheath was upsized to a 6 [place]. Weight-based bivalirudin was given for anticoagulation. Plavix 600 mg was given orally. Once a therapeutic ACT was achieved, a 6 French LCB guide catheter was inserted.  A filter coronary guidewire was used to cross the lesion and the filter was deployed in the mid graft.   The lesion was then primarily stented with a 3.0 x 16 mm Promus premier stent.   Following PCI, there was 0% residual stenosis and TIMI-3 flow. Final angiography confirmed an excellent result. Femoral hemostasis was achieved with manual compression.  The patient tolerated the PCI procedure well. There were no immediate procedural complications.  The patient was transferred to the post catheterization recovery area for further monitoring.  PCI Data: Vessel - SVG to diagonal/intermediate/OM Segment - proximal Percent Stenosis (pre)  90% TIMI-flow 3 Stent 3.0 x 16 mm Promus premier Percent Stenosis (post) 0% TIMI-flow (post) 3  Final Conclusions:   1. Severe three-vessel obstructive coronary disease. 2. Patent LIMA graft to the LAD 3. Severe stenosis in the saphenous vein graft to the diagonal/intermediate/OM 1. 4. Occluded saphenous vein graft to the RCA 5. Moderate LV dysfunction. 6. Successful stenting of the saphenous vein graft to the diagonal/intermediate/OM 1 with a drug-eluting stent.  Recommendations: Continue dual antiplatelet therapy for  at least one year. Aggressive risk factor modification.  Cielle Aguila [facility] 07/14/2013,  11:06 AM

## 2013-07-14 NOTE — Progress Notes (Signed)
     TELEMETRY: Reviewed telemetry pt in sinus brady rate 45-60 bpm: Filed Vitals:   07/14/13 0631 07/14/13 0800 07/14/13 0857 07/14/13 0858  BP: 105/62 127/77 127/77 127/77  Pulse: 57 53 52   Temp: 98.2 F (36.8 C) 98.4 F (36.9 C)    TempSrc: Oral Oral    Resp: 18 16    Height:      Weight:      SpO2: 95% 92%      Intake/Output Summary (Last 24 hours) at 07/14/13 0931 Last data filed at 07/14/13 0900  Gross per 24 hour  Intake 1533.5 ml  Output   1750 ml  Net -216.5 ml    SUBJECTIVE Feels well today. No active chest pain or SOB.  LABS: Basic Metabolic Panel:  Recent Labs  07/13/13 0525  NA 140  K 4.7  CL 103  CO2 26  GLUCOSE 112*  BUN 8  CREATININE 0.83  CALCIUM 8.4   CBC:  Recent Labs  07/13/13 0955 07/14/13 0510  WBC 7.5 5.4  HGB 13.1 13.2  HCT 38.4* 38.7*  MCV 100.8* 98.5  PLT 168 162   Cardiac Enzymes:  Recent Labs  07/13/13 0518 07/13/13 0945 07/13/13 1748  TROPONINI <0.30 0.39* 1.07*   BNP: 182.5  Fasting Lipid Panel:  Recent Labs  07/13/13 0525  CHOL 155  HDL 34*  LDLCALC 77  TRIG 218*  CHOLHDL 4.6    Radiology/Studies:  CXR at [facility] shows no acute change.  Ecg: sinus brady, Old inferior infarct. ST-T abnormality c/w lateral ischemia.  PHYSICAL EXAM General: Well developed, obese, in no acute distress. Head: Normal Neck: Negative for carotid bruits. JVD not elevated. Lungs: bibasilar crackles. Breathing is unlabored. Heart: RRR S1 S2 without murmurs, rubs, or gallops.  Abdomen: Soft, non-tender, non-distended with normoactive bowel sounds. No hepatomegaly. Msk:  Strength and tone appears normal for age. Extremities: No clubbing, cyanosis or edema.  Distal pedal pulses are 2+ and equal bilaterally. Neuro: Alert and oriented X 3. Moves all extremities spontaneously. Psych:  Responds to questions appropriately with a normal affect.  ASSESSMENT AND PLAN: 1. NSTEMI. Troponins are positive. Ecg shows lateral  ischemia. Currently pain free. Plan cardiac cath today.  On IV heparin. The procedure and risks were reviewed including but not limited to death, myocardial infarction, stroke, arrythmias, bleeding, transfusion, emergency surgery, dye allergy, or renal dysfunction. The patient voices understanding and is agreeable to proceed. 2. Hypotension. Improved. ACEi held last pm 3. CAD s/p CABG. Known occlusion of SVG to RCA. 4. Bipolar disorder. 5. Dyslipidemia 6. Tobacco abuse.  Principal Problem:   Unstable angina Active Problems:   HYPERLIPIDEMIA-MIXED   TOBACCO USER   CAD, NATIVE VESSEL   Essential hypertension, benign    Signed, Eon Zunker MD,FACC 07/14/2013 9:31 AM    

## 2013-07-15 ENCOUNTER — Encounter (HOSPITAL_COMMUNITY): Payer: Self-pay | Admitting: Physician Assistant

## 2013-07-15 ENCOUNTER — Telehealth: Payer: Self-pay | Admitting: *Deleted

## 2013-07-15 DIAGNOSIS — I255 Ischemic cardiomyopathy: Secondary | ICD-10-CM

## 2013-07-15 DIAGNOSIS — I214 Non-ST elevation (NSTEMI) myocardial infarction: Secondary | ICD-10-CM

## 2013-07-15 DIAGNOSIS — E785 Hyperlipidemia, unspecified: Secondary | ICD-10-CM

## 2013-07-15 LAB — CBC
Hemoglobin: 12.7 g/dL — ABNORMAL LOW (ref 13.0–17.0)
MCHC: 34.1 g/dL (ref 30.0–36.0)
MCV: 98.4 fL (ref 78.0–100.0)
RBC: 3.78 MIL/uL — ABNORMAL LOW (ref 4.22–5.81)
RDW: 13.7 % (ref 11.5–15.5)
WBC: 5.2 10*3/uL (ref 4.0–10.5)

## 2013-07-15 LAB — BASIC METABOLIC PANEL
Chloride: 101 mEq/L (ref 96–112)
GFR calc Af Amer: >90 mL/min (ref >90)
GFR calc non Af Amer: >90 mL/min (ref >90)
Potassium: 3.9 mEq/L (ref 3.5–5.1)
Sodium: 137 mEq/L (ref 135–145)

## 2013-07-15 MED ORDER — CLOPIDOGREL BISULFATE 75 MG PO TABS: 75.0000 mg | ORAL_TABLET | Freq: Every day | ORAL | Status: DC

## 2013-07-15 MED ORDER — CARVEDILOL 12.5 MG PO TABS: 12.5000 mg | ORAL_TABLET | Freq: Two times a day (BID) | ORAL | Status: DC

## 2013-07-15 MED ORDER — CARVEDILOL 12.5 MG PO TABS
12.5000 mg | ORAL_TABLET | Freq: Two times a day (BID) | ORAL | Status: DC
  Administered 2013-07-15: 12.5 mg via ORAL
  Filled 2013-07-15 (×2): qty 1

## 2013-07-15 MED ORDER — HEART ATTACK BOUNCING BOOK
Freq: Once | Status: AC
  Administered 2013-07-15: 01:00:00
  Filled 2013-07-15: qty 1

## 2013-07-15 MED FILL — Heparin Sodium (Porcine) 100 Unt/ML in Sodium Chloride 0.45%: INTRAMUSCULAR | Qty: 250 | Status: AC

## 2013-07-15 MED FILL — Nitroglycerin IV Soln 100 MCG/ML in D5W: INTRAVENOUS | Qty: 250 | Status: AC

## 2013-07-15 MED FILL — Sodium Chloride IV Soln 0.9%: INTRAVENOUS | Qty: 50 | Status: AC

## 2013-07-15 MED FILL — Morphine Sulfate Inj 10 MG/ML: INTRAMUSCULAR | Qty: 1 | Status: AC

## 2013-07-15 NOTE — Progress Notes (Signed)
CARDIAC REHAB PHASE I   PRE:  Rate/Rhythm:  Up in hall  BP:  Supine:   Sitting: 155/87  Standing:    SaO2:   MODE:  Ambulation: 500 ft   POST:  Rate/Rhythm: 88 SR  BP:  Supine:   Sitting: 156/86  Standing:    SaO2:  0830-0920 Pt walked in hall 500 ft with steady gait and no CP. Pt stated he could not do this prior to procedure. Education completed with pt. Gave smoking handouts and encouraged him to call 1800quitnow for coaching. Wife smokes. Pt stated he quit once for 6 months but started back once he was around smokers. Plans to decrease amount over a week. He discussed things he was going to try when he had urge to smoke. Discussed CRP 2. Permission given to refer to [place]. Pt does not drive and will have to arrange transportation.   Luetta Nutting, RN BSN  07/15/2013 9:18 AM

## 2013-07-15 NOTE — Discharge Summary (Signed)
Discharge Summary   Patient ID: ALISTAIR SENFT MRN: [id number], DOB/AGE: 10-19-1965 47 y.o. Admit date: 07/13/2013 D/C date:     07/15/2013  Primary Cardiologist: Molli Hazard  Primary Discharge Diagnoses:  1. CAD with NSTEMI - s/p PTCA/DES to SVG-diagonal/intermediate/OM1 this admission -  hx of CABG 2003, NSTEMI 2010 s/p BMS to SVG-RCA, PTCA/stent/asp thromb to SVG-RCA 01/2010, PTCA to SVG-RCA 04/2010, known occlusion of SVG-RCA 2013 2. Ischemic cardiomyopathy EF 35-40% by cath this admission 3. Dyslipidemia  4. Tobacco abuse  5. Sinus bradycardia  Secondary Discharge Diagnoses:  1. Bipolar disorder 2. GERD 3. Gout 4. Chronic back pain  Hospital Course:Mr. Ferraz is a 47 y/o M with history of CAD s/p CABG, HTN, dyslipidemia, ongoing tobacco abuse who presented initially to [facility] on 07/13/13 with severe chest pain. He has been having worsening DOE for several days as well as chest pain with exertion. The evening of admission, he had sudden onset of severe CP, nausea, lightheadedness and dizziness. SL NTG did not help so he went to the ER. He was found to have EKG changes with 1mm ST depression in the inferior and lateral leads and was started on IV nitro and heparin drips. He was transferred to [facility] for further management. Troponins returned elevated to peak of 1.07 ruling him in for NSTEMI. He underwent cardiac cath showing: 1. Severe three-vessel obstructive coronary disease.  2. Patent LIMA graft to the LAD  3. Severe stenosis in the saphenous vein graft to the diagonal/intermediate/OM 1 --> s/p successful stenting of the saphenous vein graft to the diagonal/intermediate/OM 1 with a drug-eluting stent. 4. Occluded saphenous vein graft to the RCA  5. Moderate LV dysfunction. Left ventricular systolic function is abnormal, LVEF is estimated at 35-40%, there is severe hypokinesis of the mid to distal anterior wall and inferior basal wall. There is no  significant mitral regurgitation  DAPT for at least 1 yr was recommended. The patient tolerated the procedure well. Due to sinus bradycardia (HR occ dipping into 40's) and ICM, metoprolol was switched to Coreg. Imdur/Ranexa were continued. The patient was instructed to discuss alternatives to Indomethacin with his PCP given potential interaction with Plavix identified by pharmacy. He was also educated on the importance of weighing daily, low sodium diet and identifying CHF symptoms early given decreased EF. (He was euvolemic during admission.) Dr. [place] has seen and examined the patient today and feels he is stable for discharge.   Dr. Diona Browner did not have any appointments in [place] until November so the patient was scheduled in [place] with Harriet Pho for his [facility] appointment.  Discharge Vitals: Blood pressure 155/87, pulse 48, temperature 97.7 F (36.5 C), temperature source Oral, resp. rate 18, height 5\' 4"  (1.626 m), weight 225 lb 1.4 oz (102.1 kg), SpO2 95.00%.  Labs: Lab Results  Component Value Date   WBC 5.2 07/15/2013   HGB 12.7* 07/15/2013   HCT 37.2* 07/15/2013   MCV 98.4 07/15/2013   PLT 147* 07/15/2013     Recent Labs Lab 07/15/13 0550  NA 137  K 3.9  CL 101  CO2 30  BUN 9  CREATININE 0.95  CALCIUM 8.4  GLUCOSE 108*    Recent Labs  07/13/13 0518 07/13/13 0945 07/13/13 1748  TROPONINI <0.30 0.39* 1.07*   Lab Results  Component Value Date   CHOL 155 07/13/2013   HDL 34* 07/13/2013   LDLCALC 77 07/13/2013   TRIG 218* 07/13/2013   D-dimer <0.27  Diagnostic Studies/Procedures   Cardiac catheterization this admission, please see full report and above for summary.  [facility] admit CXR: stable mild cardiomegaly, no acute pulm disease.  Discharge Medications     Medication List    STOP taking these medications       indomethacin 50 MG capsule  Commonly known as:  INDOCIN     metoprolol succinate 50 MG 24 hr tablet  Commonly known as:  TOPROL-XL      TAKE  these medications       albuterol 108 (90 BASE) MCG/ACT inhaler  Commonly known as:  PROVENTIL HFA;VENTOLIN HFA  Inhale 2 puffs into the lungs every 6 (six) hours as needed for wheezing or shortness of breath.     allopurinol 300 MG tablet  Commonly known as:  ZYLOPRIM  Take 300 mg by mouth daily as needed (for gout flare ups).     aspirin EC 81 MG tablet  Take 1 tablet (81 mg total) by mouth daily.     baclofen 10 MG tablet  Commonly known as:  LIORESAL  Take 10 mg by mouth See admin instructions. Three times daily as needed for spasms. Typically takes 2 times a day     carvedilol 12.5 MG tablet  Commonly known as:  COREG  Take 1 tablet (12.5 mg total) by mouth 2 (two) times daily with a meal.     clopidogrel 75 MG tablet  Commonly known as:  PLAVIX  Take 1 tablet (75 mg total) by mouth daily.     colchicine 0.6 MG tablet  Take 0.6 mg by mouth daily as needed (for gout flare ups).     diazepam 5 MG tablet  Commonly known as:  VALIUM  Take 5 mg by mouth 2 (two) times daily.     isosorbide mononitrate 60 MG 24 hr tablet  Commonly known as:  IMDUR  Take 1 tablet (60 mg total) by mouth 2 (two) times daily.     lisinopril 2.5 MG tablet  Commonly known as:  PRINIVIL,ZESTRIL  Take 1 tablet (2.5 mg total) by mouth 2 (two) times daily.     nitroGLYCERIN 0.4 MG SL tablet  Commonly known as:  NITROSTAT  Place 0.4 mg under the tongue every 5 (five) minutes x 3 doses as needed for chest pain.     pantoprazole 40 MG tablet  Commonly known as:  PROTONIX  Take 40 mg by mouth daily. Obtain further refills from primary MD     pravastatin 80 MG tablet  Commonly known as:  PRAVACHOL  Take 1 tablet (80 mg total) by mouth every evening.     ranolazine 500 MG 12 hr tablet  Commonly known as:  RANEXA  Take 1 tablet (500 mg total) by mouth 2 (two) times daily.        Disposition   The patient will be discharged in stable condition to home. Discharge Orders   Future Appointments  Provider Department Dept Phone   07/22/2013 2:30 PM Jodelle Gross, NP [facility] [phone]   Future Orders Complete By Expires   Amb Referral to Cardiac Rehabilitation  As directed    Diet - low sodium heart healthy  As directed    Discharge instructions  As directed    Comments:     Please talk to your primary doctor about alternatives to Indomethacin. This medicine can interact with Plavix.  One of your heart tests showed weakness of the heart muscle this admission. This may make you  more susceptible to weight gain from fluid retention, which can lead to symptoms that we call heart failure. Please follow these special instructions:  1. Follow a low-salt diet and watch your fluid intake. In general, you should not be taking in more than 2 liters of fluid per day (no more than 8 glasses per day). Some patients are restricted to less than 1.5 liters of fluid per day (no more than 6 glasses per day). This includes sources of water in foods like soup, coffee, tea, milk, etc. 2. Weigh yourself on the same scale at same time of day and keep a log. 3. Call your doctor: (Anytime you feel any of the following symptoms)  - 3-4 pound weight gain in 1-2 days or 2 pounds overnight  - Shortness of breath, with or without a dry hacking cough  - Swelling in the hands, feet or stomach  - If you have to sleep on extra pillows at night in order to breathe  IT IS IMPORTANT TO LET YOUR DOCTOR KNOW EARLY ON IF YOU ARE HAVING SYMPTOMS SO WE CAN HELP YOU!   Increase activity slowly  As directed    Comments:     No driving for 1 week. No lifting over 10 lbs for 2 weeks. No sexual activity for 2 weeks. Keep procedure site clean & dry. If you notice increased pain, swelling, bleeding or pus, call/return!  You may shower, but no soaking baths/hot tubs/pools for 1 week.     Follow-up Information   Follow up with Joni Reining, NP. ([place] office - Dr. Diona Browner does not have any available  appointments in [facility] until November, so you will see his PA in [place] on 07/22/13 at 2:30pm.)    Specialty:  Nurse Practitioner   Contact information:   [address] [place] [address] [phone]         Duration of Discharge Encounter: Greater than 30 minutes including physician and PA time.  Signed, Ronie Spies PA-C 07/15/2013, 9:56 AM

## 2013-07-15 NOTE — Progress Notes (Signed)
Patient: CHIOKE NOXON / Admit Date: 07/13/2013 / Date of Encounter: 07/15/2013, 8:30 AM   Subjective  Denies CP or SOB. Feels well.  Objective   Telemetry: sinus bradycardia HR 50s (occ dipping into 40's)  Physical Exam: Blood pressure 118/70, pulse 53, temperature 97.6 F (36.4 C), temperature source Oral, resp. rate 16, height 5\' 4"  (1.626 m), weight 225 lb 1.4 oz (102.1 kg), SpO2 95.00%. General: Well developed, well nourished WM in no acute distress, laying flat in bed Head: Normocephalic, atraumatic, sclera non-icteric, no xanthomas, nares are without discharge. Neck: Negative for carotid bruits. JVP not elevated. Lungs: Clear bilaterally to auscultation without wheezes, rales, or rhonchi. Breathing is unlabored. Heart: RRR S1 S2 without murmurs, rubs, or gallops.  Abdomen: Soft, non-tender, non-distended with normoactive bowel sounds. No rebound/guarding. Extremities: No clubbing or cyanosis. No edema. Distal pedal pulses are 2+ and equal bilaterally. R groin without ecchymosis, hematoma or oozing Neuro: Alert and oriented X 3. Moves all extremities spontaneously. Psych:  Responds to questions appropriately with a normal affect.   Intake/Output Summary (Last 24 hours) at 07/15/13 0830 Last data filed at 07/15/13 0500  Gross per 24 hour  Intake  834.2 ml  Output   1875 ml  Net -1040.8 ml    Inpatient Medications:  . allopurinol  300 mg Oral Daily  . aspirin EC  81 mg Oral Daily  . Chlorhexidine Gluconate Cloth  6 each Topical Q0600  . clopidogrel  75 mg Oral Q breakfast  . diazepam  5 mg Oral BID  . indomethacin  50 mg Oral TID WC  . lisinopril  2.5 mg Oral BID  . metoprolol succinate  50 mg Oral Daily  . mupirocin ointment  1 application Nasal BID  . pantoprazole  40 mg Oral Daily  . pravastatin  80 mg Oral q1800  . ranolazine  500 mg Oral BID   Infusions:    Labs:  Recent Labs  07/13/13 0525 07/15/13 0550  NA 140 137  K 4.7 3.9  CL 103 101  CO2 26 30   GLUCOSE 112* 108*  BUN 8 9  CREATININE 0.83 0.95  CALCIUM 8.4 8.4    Recent Labs  07/14/13 0510 07/15/13 0550  WBC 5.4 5.2  HGB 13.2 12.7*  HCT 38.7* 37.2*  MCV 98.5 98.4  PLT 162 147*    Recent Labs  07/13/13 0518 07/13/13 0945 07/13/13 1748  TROPONINI <0.30 0.39* 1.07*    Radiology/Studies:  Cardiac cath this adm - see report   Assessment and Plan  1. CAD with NSTEMI (hx of CABG 2003, NSTEMI 2010 s/p BMS to SVG-RCA, PTCA/stent/asp thromb to SVG-RCA 01/2010, PTCA to SVG-RCA 04/2010, known occlusion of SVG-RCA 2013), s/p PTCA/DES to SVG-diagonal/intermediate/OM1 2. Ischemic cardiomyopathy EF 35-40% 3. Dyslipidemia 4. Tobacco abuse 5. Sinus bradycardia  Continue ASA & Plavix for at least 1 yr. Pt is still on Ranexa at this time. MD to advise on resumption of home Imdur as well as thoughts on chronic TID indomethacin (pharmacy indicating that indomethacin can reduce Plavix efficacy). Consider changing Toprol to Coreg given LV dysfunction - HR also running low thus dose may need adjusting. Continue ACEI. Consider changing Pravastatin to more potent agent - pt reports 4 yrs ago, he was changed from Lipitor to Pravastatin due to insurance. Likely DC today with f/u [place].  Signed, Ronie Spies PA-C Patient seen and examined and history reviewed. Agree with above findings and plan. Patient is pain free. No groin hematoma. BP mildly elevated.  Given chronic occlusion of RCA with collaterals will continue antianginal therapy with Ranexa and isosorbide. Continue current statin Rx. Will switch Toprol to Carvedilol due to LV dysfunction. This should also have less rate slowing. Stressed importance of risk factor modification especially smoking cessation. Will DC today with follow up in [place] office.  Theron Arista [facility]  07/15/2013 9:52 AM

## 2013-07-15 NOTE — Telephone Encounter (Signed)
7 day TCM pt is scheduled to see [name] [name] 07/22/13/tmj

## 2013-07-15 NOTE — Telephone Encounter (Signed)
Patient contacted regarding discharge from [facility] on 07-15-13.  Patient understands to follow up with provider Joni Reining, NP on 07-22-13 at 2:30pm at [place] office. Patient understands discharge instructions? yes Patient understands medications and regiment? yes Patient understands to bring all medications to this visit? yes

## 2013-07-15 NOTE — Discharge Summary (Signed)
Patient seen and examined and history reviewed. Agree with above findings and plan. Please see prior rounding note.  Theron Arista JordanMD 07/15/2013 10:22 AM

## 2013-07-18 ENCOUNTER — Ambulatory Visit (INDEPENDENT_AMBULATORY_CARE_PROVIDER_SITE_OTHER): Payer: PRIVATE HEALTH INSURANCE | Admitting: Adult Health

## 2013-07-18 ENCOUNTER — Encounter: Payer: Self-pay | Admitting: Adult Health

## 2013-07-18 VITALS — BP 122/75 | HR 58 | Ht 64.0 in | Wt 220.0 lb

## 2013-07-18 DIAGNOSIS — I10 Essential (primary) hypertension: Secondary | ICD-10-CM

## 2013-07-18 DIAGNOSIS — I2589 Other forms of chronic ischemic heart disease: Secondary | ICD-10-CM

## 2013-07-18 DIAGNOSIS — I255 Ischemic cardiomyopathy: Secondary | ICD-10-CM

## 2013-07-18 DIAGNOSIS — I214 Non-ST elevation (NSTEMI) myocardial infarction: Secondary | ICD-10-CM

## 2013-07-18 NOTE — Patient Instructions (Signed)
Your physician recommends that you schedule a follow-up appointment in: 6 weeks with Dr Diona Browner in the [facility] [place]  Your physician recommends that you continue on your current medications as directed. Please refer to the Current Medication list given to you today.

## 2013-07-18 NOTE — Progress Notes (Signed)
HPI: [name] [name] is a 47 year old patient of Dr. Diona Browner were following for ongoing assessment and management of CAD, (CABG with subsequent stent placement to the SVG to RCA. Documented occlusion of the SVG to RCA ultimately and has been managed medically) hypertension, hyperlipidemia, with history of ongoing tobacco abuse.      The patient was last seen in the office in June of 2014. At that time the patient was stable without any changes in his medications. Ongoing recommendations for smoking cessation were also discussed.   He is here s/p hospitalization in Oct 2014 with NSTEMI, PTCA/DES to SVG diagonal-intermediate/OM1. He was placed on DAPT. EF was diminished at 35%-40%. He was not placed on diuretics, but has maintained his wt at 220 lbs., He is weighing every day. He is trying to quit smoking. He has had some mild angina after smoking only. Metoprolol was changed to coreg in the setting of bradycardia. He is medically compliant.     No Known Allergies  Current Outpatient Prescriptions  Medication Sig Dispense Refill  . albuterol (PROVENTIL HFA;VENTOLIN HFA) 108 (90 BASE) MCG/ACT inhaler Inhale 2 puffs into the lungs every 6 (six) hours as needed for wheezing or shortness of breath.      . allopurinol (ZYLOPRIM) 300 MG tablet Take 300 mg by mouth daily as needed (for gout flare ups).      Marland Kitchen aspirin EC 81 MG tablet Take 1 tablet (81 mg total) by mouth daily.      . baclofen (LIORESAL) 10 MG tablet Take 10 mg by mouth See admin instructions. Three times daily as needed for spasms. Typically takes 2 times a day      . carvedilol (COREG) 12.5 MG tablet Take 1 tablet (12.5 mg total) by mouth 2 (two) times daily with a meal.  60 tablet  6  . clopidogrel (PLAVIX) 75 MG tablet Take 1 tablet (75 mg total) by mouth daily.  30 tablet  11  . colchicine 0.6 MG tablet Take 0.6 mg by mouth daily as needed (for gout flare ups).      . diazepam (VALIUM) 5 MG tablet Take 5 mg by mouth 2 (two) times daily.         . isosorbide mononitrate (IMDUR) 60 MG 24 hr tablet Take 1 tablet (60 mg total) by mouth 2 (two) times daily.  180 tablet  3  . lisinopril (PRINIVIL,ZESTRIL) 2.5 MG tablet Take 1 tablet (2.5 mg total) by mouth 2 (two) times daily.  180 tablet  3  . nitroGLYCERIN (NITROSTAT) 0.4 MG SL tablet Place 0.4 mg under the tongue every 5 (five) minutes x 3 doses as needed for chest pain.      . pantoprazole (PROTONIX) 40 MG tablet Take 40 mg by mouth daily. Obtain further refills from primary MD       . pravastatin (PRAVACHOL) 80 MG tablet Take 1 tablet (80 mg total) by mouth every evening.  90 tablet  3  . ranolazine (RANEXA) 500 MG 12 hr tablet Take 1 tablet (500 mg total) by mouth 2 (two) times daily.  60 tablet  6   No current facility-administered medications for this visit.    Past Medical History  Diagnosis Date  . Coronary atherosclerosis of native coronary artery     a. CABG 2003. b. NSTEMI 2010 s/p BMS to SVG-RCA c. PTCA/stent/asp thromb to SVG-RCA 01/2010, PTCA to SVG-RCA 04/2010 d. known occlusion of SVG-RCA 2013. e. NSTEMI 07/2013  s/p PTCA/DES to SVG-diagonal/intermediate/OM1.   Marland Kitchen  Mixed hyperlipidemia   . Bipolar disorder   . GERD (gastroesophageal reflux disease)   . Gout   . Chronic back pain   . Tobacco abuse   . Ischemic cardiomyopathy     a. EF 35-40% by cath 07/2013.  . Sinus bradycardia     Past Surgical History  Procedure Laterality Date  . Cholecystectomy    . Coronary artery bypass graft      2003, LIMA to LAD; SVG to diagonal; SVG to OM, ramus, and diagonal; SVG to RCA  . Pin placement left leg    . Metal plate to chin      WUJ:WJXBJY of systems complete and found to be negative unless listed above  PHYSICAL EXAM BP 122/75  Pulse 58  Ht 5\' 4"  (1.626 m)  Wt 220 lb (99.791 kg)  BMI 37.74 kg/m2  General: Well developed, well nourished, in no acute distress, obese. Head: Eyes PERRLA, No xanthomas.   Normal cephalic and atramatic  Lungs: Clear bilaterally  to auscultation and percussion. Heart: HRRR S1 S2, without MRG.  Pulses are 2+ & equal.            No carotid bruit. No JVD.  No abdominal bruits. No femoral bruits. Abdomen: Bowel sounds are positive, abdomen soft and non-tender obese,  without masses or                  Hernia's noted. Msk:  Back normal, normal gait. Normal strength and tone for age. Extremities: No clubbing, cyanosis or edema.  DP +1, right groin site is free of ecchymosis, hematoma, or bleeding. Neuro: Alert and oriented X 3. Psych:  Good affect, responds appropriately    ASSESSMENT AND PLAN

## 2013-07-18 NOTE — Assessment & Plan Note (Signed)
Most recent cardiac catheterization demonstrated an EF of 35-40%. He is currently on Coreg 12.5 mg twice a day, isosorbide, lisinopril 2.5 mg daily but is not on any diuretics or spironolactone at this time. He continues to weigh daily and has been stable in his weight since discharge. I have counseled him on low-salt diet.

## 2013-07-18 NOTE — Assessment & Plan Note (Signed)
He is stable. He has only had occasional angina in the setting of smoking. He is medically compliant, having no issues with bleeding on Plavix. He will continue smoking cessation efforts. I will not make any changes on this medication regimen at this time. He will have close followup in [place] in 6 weeks with Dr. Diona Browner.

## 2013-07-18 NOTE — Progress Notes (Deleted)
Name: [name] [name]    DOB: 1966/08/25  Age: 47 y.o.  MR#: [id number]       PCP:  Toma Deiters, MD      Insurance: Payor: [employer] MEDICARE / Plan: Ollen Gross / Product Type: *No Product type* /   CC:    Chief Complaint  Patient presents with  . Coronary Artery Disease  . Hypertension    VS Filed Vitals:   07/18/13 1520  BP: 122/75  Pulse: 58  Height: 5\' 4"  (1.626 m)  Weight: 220 lb (99.791 kg)    Weights Current Weight  07/18/13 220 lb (99.791 kg)  07/15/13 225 lb 1.4 oz (102.1 kg)  07/15/13 225 lb 1.4 oz (102.1 kg)    Blood Pressure  BP Readings from Last 3 Encounters:  07/18/13 122/75  07/15/13 155/87  07/15/13 155/87     Admit date:  (Not on file) Last encounter with RMR:  Visit date not found   Allergy Review of patient's allergies indicates no known allergies.  Current Outpatient Prescriptions  Medication Sig Dispense Refill  . albuterol (PROVENTIL HFA;VENTOLIN HFA) 108 (90 BASE) MCG/ACT inhaler Inhale 2 puffs into the lungs every 6 (six) hours as needed for wheezing or shortness of breath.      . allopurinol (ZYLOPRIM) 300 MG tablet Take 300 mg by mouth daily as needed (for gout flare ups).      Marland Kitchen aspirin EC 81 MG tablet Take 1 tablet (81 mg total) by mouth daily.      . baclofen (LIORESAL) 10 MG tablet Take 10 mg by mouth See admin instructions. Three times daily as needed for spasms. Typically takes 2 times a day      . carvedilol (COREG) 12.5 MG tablet Take 1 tablet (12.5 mg total) by mouth 2 (two) times daily with a meal.  60 tablet  6  . clopidogrel (PLAVIX) 75 MG tablet Take 1 tablet (75 mg total) by mouth daily.  30 tablet  11  . colchicine 0.6 MG tablet Take 0.6 mg by mouth daily as needed (for gout flare ups).      . diazepam (VALIUM) 5 MG tablet Take 5 mg by mouth 2 (two) times daily.        . isosorbide mononitrate (IMDUR) 60 MG 24 hr tablet Take 1 tablet (60 mg total) by mouth 2 (two) times daily.  180 tablet  3  . lisinopril  (PRINIVIL,ZESTRIL) 2.5 MG tablet Take 1 tablet (2.5 mg total) by mouth 2 (two) times daily.  180 tablet  3  . nitroGLYCERIN (NITROSTAT) 0.4 MG SL tablet Place 0.4 mg under the tongue every 5 (five) minutes x 3 doses as needed for chest pain.      . pantoprazole (PROTONIX) 40 MG tablet Take 40 mg by mouth daily. Obtain further refills from primary MD       . pravastatin (PRAVACHOL) 80 MG tablet Take 1 tablet (80 mg total) by mouth every evening.  90 tablet  3  . ranolazine (RANEXA) 500 MG 12 hr tablet Take 1 tablet (500 mg total) by mouth 2 (two) times daily.  60 tablet  6   No current facility-administered medications for this visit.    Discontinued Meds:   There are no discontinued medications.  Patient Active Problem List   Diagnosis Date Noted  . Ischemic cardiomyopathy 07/15/2013  . NSTEMI (non-ST elevated myocardial infarction) 07/13/2013  . Essential hypertension, benign 12/05/2012  . Sinus bradycardia 12/05/2012  . TOBACCO USER 08/10/2009  . HYPERLIPIDEMIA-MIXED  06/17/2009  . CAD, NATIVE VESSEL 06/17/2009    LABS    Component Value Date/Time   NA 137 07/15/2013 0550   NA 140 07/13/2013 0525   NA 140 04/13/2010 0409   K 3.9 07/15/2013 0550   K 4.7 07/13/2013 0525   K 4.5 04/13/2010 0409   CL 101 07/15/2013 0550   CL 103 07/13/2013 0525   CL 107 04/13/2010 0409   CO2 30 07/15/2013 0550   CO2 26 07/13/2013 0525   CO2 28 04/13/2010 0409   GLUCOSE 108* 07/15/2013 0550   GLUCOSE 112* 07/13/2013 0525   GLUCOSE 99 04/13/2010 0409   BUN 9 07/15/2013 0550   BUN 8 07/13/2013 0525   BUN 5* 04/13/2010 0409   CREATININE 0.95 07/15/2013 0550   CREATININE 0.83 07/13/2013 0525   CREATININE 1.04 04/13/2010 0409   CALCIUM 8.4 07/15/2013 0550   CALCIUM 8.4 07/13/2013 0525   CALCIUM 8.6 04/13/2010 0409   GFRNONAA >90 07/15/2013 0550   GFRNONAA >90 07/13/2013 0525   GFRNONAA >60 04/13/2010 0409   GFRAA >90 07/15/2013 0550   GFRAA >90 07/13/2013 0525   GFRAA  Value: >60        The eGFR has been calculated using the  MDRD equation. This calculation has not been validated in all clinical situations. eGFR's persistently <60 mL/min signify possible Chronic Kidney Disease. 04/13/2010 0409   CMP     Component Value Date/Time   NA 137 07/15/2013 0550   K 3.9 07/15/2013 0550   CL 101 07/15/2013 0550   CO2 30 07/15/2013 0550   GLUCOSE 108* 07/15/2013 0550   BUN 9 07/15/2013 0550   CREATININE 0.95 07/15/2013 0550   CALCIUM 8.4 07/15/2013 0550   PROT 5.9* 04/12/2010 0410   ALBUMIN 3.3* 04/12/2010 0410   AST 39* 04/12/2010 0410   ALT 41 04/12/2010 0410   ALKPHOS 40 04/12/2010 0410   BILITOT 0.4 04/12/2010 0410   GFRNONAA >90 07/15/2013 0550   GFRAA >90 07/15/2013 0550       Component Value Date/Time   WBC 5.2 07/15/2013 0550   WBC 5.4 07/14/2013 0510   WBC 7.5 07/13/2013 0955   HGB 12.7* 07/15/2013 0550   HGB 13.2 07/14/2013 0510   HGB 13.1 07/13/2013 0955   HCT 37.2* 07/15/2013 0550   HCT 38.7* 07/14/2013 0510   HCT 38.4* 07/13/2013 0955   MCV 98.4 07/15/2013 0550   MCV 98.5 07/14/2013 0510   MCV 100.8* 07/13/2013 0955    Lipid Panel     Component Value Date/Time   CHOL 155 07/13/2013 0525   TRIG 218* 07/13/2013 0525   HDL 34* 07/13/2013 0525   CHOLHDL 4.6 07/13/2013 0525   VLDL 44* 07/13/2013 0525   LDLCALC 77 07/13/2013 0525    ABG No results found for this basename: phart, pco2, pco2art, po2, po2art, hco3, tco2, acidbasedef, o2sat     Lab Results  Component Value Date   TSH 2.275 ***Test methodology is 3rd generation TSH**** 04/12/2010   BNP (last 3 results)  Recent Labs  07/13/13 0518  PROBNP 182.5*   Cardiac Panel (last 3 results) No results found for this basename: CKTOTAL, CKMB, TROPONINI, RELINDX,  in the last 72 hours  Iron/TIBC/Ferritin No results found for this basename: iron, tibc, ferritin     EKG Orders placed during the hospital encounter of 07/13/13  . EKG 12-LEAD  . EKG 12-LEAD  . EKG 12-LEAD  . EKG 12-LEAD  . EKG 12-LEAD  . EKG 12-LEAD  . EKG 12-LEAD  .  EKG 12-LEAD  . EKG 12-LEAD  .  EKG     Prior Assessment and Plan Problem List as of 07/18/2013     Cardiovascular and Mediastinum   CAD, NATIVE VESSEL   Last Assessment & Plan   03/18/2013 Office Visit Written 03/18/2013  3:36 PM by Jonelle Sidle, MD     Multivessel disease status post CABG and subsequent stent placement to the SVG to RCA. Documented to have occlusion of the SVG to RCA ultimately, and has been managed medically. Plan to continue current regimen and observation.    Essential hypertension, benign   Last Assessment & Plan   03/18/2013 Office Visit Written 03/18/2013  3:36 PM by Jonelle Sidle, MD     Blood pressure control is good today.    Sinus bradycardia   Last Assessment & Plan   12/05/2012 Office Visit Written 12/05/2012  1:48 PM by Rande Brunt, PA     Chronic, asymptomatic. Continue current dose beta blocker.    NSTEMI (non-ST elevated myocardial infarction)   Ischemic cardiomyopathy     Other   HYPERLIPIDEMIA-MIXED   Last Assessment & Plan   03/18/2013 Office Visit Written 03/18/2013  3:36 PM by Jonelle Sidle, MD     Continue Pravachol.    TOBACCO USER   Last Assessment & Plan   03/18/2013 Office Visit Written 03/18/2013  3:36 PM by Jonelle Sidle, MD     Smoking cessation has been recommended, he has not been able to quit.        Imaging: No results found.

## 2013-07-18 NOTE — Assessment & Plan Note (Signed)
Blood pressure is essentially controlled. May consider up titration of carvedilol on next visit to 25 mg twice a day if tolerated.

## 2013-07-22 ENCOUNTER — Ambulatory Visit: Payer: PRIVATE HEALTH INSURANCE | Admitting: Adult Health

## 2013-07-24 ENCOUNTER — Ambulatory Visit: Payer: PRIVATE HEALTH INSURANCE | Admitting: Adult Health

## 2013-09-03 ENCOUNTER — Ambulatory Visit (INDEPENDENT_AMBULATORY_CARE_PROVIDER_SITE_OTHER): Payer: PRIVATE HEALTH INSURANCE | Admitting: Cardiology

## 2013-09-03 ENCOUNTER — Encounter: Payer: Self-pay | Admitting: Cardiology

## 2013-09-03 VITALS — BP 123/80 | HR 51 | Ht 62.0 in | Wt 220.0 lb

## 2013-09-03 DIAGNOSIS — I2589 Other forms of chronic ischemic heart disease: Secondary | ICD-10-CM

## 2013-09-03 DIAGNOSIS — I255 Ischemic cardiomyopathy: Secondary | ICD-10-CM

## 2013-09-03 DIAGNOSIS — I2510 Atherosclerotic heart disease of native coronary artery without angina pectoris: Secondary | ICD-10-CM

## 2013-09-03 DIAGNOSIS — E785 Hyperlipidemia, unspecified: Secondary | ICD-10-CM

## 2013-09-03 DIAGNOSIS — I10 Essential (primary) hypertension: Secondary | ICD-10-CM

## 2013-09-03 DIAGNOSIS — F17200 Nicotine dependence, unspecified, uncomplicated: Secondary | ICD-10-CM

## 2013-09-03 NOTE — Assessment & Plan Note (Signed)
Smoking cessation discussed 

## 2013-09-03 NOTE — Progress Notes (Signed)
Clinical Summary [name] [name] is a 47 y.o.male last seen by Ms. Lawrence NP in October. He was seen in followup at that time having presented with NSTEMI in October and undergoing followup cardiac catheterization with intervention at [facility].  Cardiac catheterization in October showed severe 3 vessel CAD, patent LIMA to the LAD, occluded SVG to RCA, and severe stenosis within the SVG to diagonal/intermediate/OM1. He underwent placement of DES at that time. LVEF was estimated at 35-40%.  Lipid panel in October showed cholesterol 155, triglycerides 218, HDL 34, LDL 77.  He reports stable symptomatology, does have occasional angina. He is trying to cut back smoking cigarettes but has not been able to quit. We discussed the importance of smoking cessation and different strategies to consider. He does report compliance with medications, no bleeding problems with DAPT.   No Known Allergies  Current Outpatient Prescriptions  Medication Sig Dispense Refill  . albuterol (PROVENTIL HFA;VENTOLIN HFA) 108 (90 BASE) MCG/ACT inhaler Inhale 2 puffs into the lungs every 6 (six) hours as needed for wheezing or shortness of breath.      . allopurinol (ZYLOPRIM) 300 MG tablet Take 300 mg by mouth daily as needed (for gout flare ups).      Marland Kitchen aspirin EC 81 MG tablet Take 1 tablet (81 mg total) by mouth daily.      . baclofen (LIORESAL) 10 MG tablet Take 10 mg by mouth See admin instructions. Three times daily as needed for spasms. Typically takes 2 times a day      . carvedilol (COREG) 12.5 MG tablet Take 1 tablet (12.5 mg total) by mouth 2 (two) times daily with a meal.  60 tablet  6  . clopidogrel (PLAVIX) 75 MG tablet Take 1 tablet (75 mg total) by mouth daily.  30 tablet  11  . colchicine 0.6 MG tablet Take 0.6 mg by mouth daily as needed (for gout flare ups).      . diazepam (VALIUM) 5 MG tablet Take 5 mg by mouth 2 (two) times daily.        . isosorbide mononitrate (IMDUR) 60 MG 24 hr tablet Take 1  tablet (60 mg total) by mouth 2 (two) times daily.  180 tablet  3  . lisinopril (PRINIVIL,ZESTRIL) 2.5 MG tablet Take 1 tablet (2.5 mg total) by mouth 2 (two) times daily.  180 tablet  3  . nitroGLYCERIN (NITROSTAT) 0.4 MG SL tablet Place 0.4 mg under the tongue every 5 (five) minutes x 3 doses as needed for chest pain.      . pantoprazole (PROTONIX) 40 MG tablet Take 40 mg by mouth daily. Obtain further refills from primary MD       . pravastatin (PRAVACHOL) 80 MG tablet Take 1 tablet (80 mg total) by mouth every evening.  90 tablet  3  . ranolazine (RANEXA) 500 MG 12 hr tablet Take 1 tablet (500 mg total) by mouth 2 (two) times daily.  60 tablet  6   No current facility-administered medications for this visit.    Past Medical History  Diagnosis Date  . Coronary atherosclerosis of native coronary artery     a. CABG 2003. b. NSTEMI 2010 s/p BMS to SVG-RCA c. PTCA/stent/asp thromb to SVG-RCA 01/2010, PTCA to SVG-RCA 04/2010 d. known occlusion of SVG-RCA 2013. e. NSTEMI 07/2013  s/p PTCA/DES to SVG-diagonal/intermediate/OM1.   . Mixed hyperlipidemia   . Bipolar disorder   . GERD (gastroesophageal reflux disease)   . Gout   . Chronic  back pain   . Tobacco abuse   . Ischemic cardiomyopathy     a. EF 35-40% by cath 07/2013.  . Sinus bradycardia     Past Surgical History  Procedure Laterality Date  . Cholecystectomy    . Coronary artery bypass graft      2003, LIMA to LAD; SVG to diagonal; SVG to OM, ramus, and diagonal; SVG to RCA  . Pin placement left leg    . Metal plate to chin      Social History [name] [name] reports that he has been smoking Cigarettes.  He has a 26.4 pack-year smoking history. He has never used smokeless tobacco. [name] [name] reports that he does not drink alcohol.  Review of Systems No palpitations or syncope. Some mild degree of chronic orthopnea. Otherwise negative.  Physical Examination Filed Vitals:   09/03/13 1339  BP: 123/80  Pulse: 51   Filed  Weights   09/03/13 1339  Weight: 220 lb (99.791 kg)    Overweight male  Smells strongly of cigarettes. Comfortable at rest.  HEENT: Conjunctiva and lids normal, oropharynx clear.  Neck: Supple, no elevated JVP or carotid bruits, no thyromegaly.  Lungs: Clear to auscultation, nonlabored breathing at rest.  Cardiac: Regular rate and rhythm, no S3, no pericardial rub.  Abdomen: Soft, nontender, bowel sounds present.  Extremities: No pitting edema, distal pulses 2+. Skin: Warm and dry. Musculoskeletal: No kyphosis. Neuropsychiatric: Alert and oriented x3, affect appropriate.  Problem List and Plan   CAD, NATIVE VESSEL Symptomatically stable at this time on medical therapy. Recent intervention to vein graft noted above. We discussed smoking cessation, compliance with medications. Followup arranged.  Essential hypertension, benign Pressure is normal today.  Ischemic cardiomyopathy LVEF 35-40% by echocardiogram in October. Will plan to reassess around the time of his next visit. Continue medical therapy.  TOBACCO USER Smoking cessation discussed.  HYPERLIPIDEMIA-MIXED LDL 77, continue statin therapy.    Jonelle Sidle, M.D., F.A.C.C.

## 2013-09-03 NOTE — Assessment & Plan Note (Signed)
LDL 77, continue statin therapy.

## 2013-09-03 NOTE — Assessment & Plan Note (Signed)
Pressure is normal today. 

## 2013-09-03 NOTE — Assessment & Plan Note (Signed)
Symptomatically stable at this time on medical therapy. Recent intervention to vein graft noted above. We discussed smoking cessation, compliance with medications. Followup arranged.

## 2013-09-03 NOTE — Assessment & Plan Note (Signed)
LVEF 35-40% by echocardiogram in October. Will plan to reassess around the time of his next visit. Continue medical therapy.

## 2013-09-03 NOTE — Patient Instructions (Signed)
Continue all current medications. Follow up in  3 months 

## 2013-10-06 ENCOUNTER — Telehealth: Payer: Self-pay | Admitting: Cardiology

## 2013-10-06 MED ORDER — RANOLAZINE ER 500 MG PO TB12: 500.0000 mg | ORAL_TABLET | Freq: Two times a day (BID) | ORAL | Status: DC

## 2013-10-06 NOTE — Telephone Encounter (Signed)
Pt called requesting refill for ranexa be sent to [facility].

## 2013-11-10 ENCOUNTER — Other Ambulatory Visit: Payer: Self-pay | Admitting: *Deleted

## 2013-11-10 MED ORDER — PRAVASTATIN SODIUM 80 MG PO TABS: 80.0000 mg | ORAL_TABLET | Freq: Every evening | ORAL | Status: DC

## 2013-12-05 ENCOUNTER — Other Ambulatory Visit: Payer: Self-pay | Admitting: *Deleted

## 2013-12-05 MED ORDER — ISOSORBIDE MONONITRATE ER 60 MG PO TB24: 60.0000 mg | ORAL_TABLET | Freq: Two times a day (BID) | ORAL | Status: DC

## 2013-12-05 MED ORDER — LISINOPRIL 2.5 MG PO TABS: 2.5000 mg | ORAL_TABLET | Freq: Two times a day (BID) | ORAL | Status: DC

## 2013-12-08 ENCOUNTER — Telehealth: Payer: Self-pay | Admitting: Cardiology

## 2013-12-08 MED ORDER — NITROGLYCERIN 0.4 MG SL SUBL: 0.4000 mg | SUBLINGUAL_TABLET | SUBLINGUAL | Status: DC | PRN

## 2013-12-08 NOTE — Telephone Encounter (Signed)
Needs refill on nitroGLYCERIN (NITROSTAT) 0.4 MG SL tablet [facility] to [facility]

## 2013-12-15 ENCOUNTER — Ambulatory Visit: Payer: PRIVATE HEALTH INSURANCE | Admitting: Cardiology

## 2013-12-16 ENCOUNTER — Ambulatory Visit (INDEPENDENT_AMBULATORY_CARE_PROVIDER_SITE_OTHER): Payer: PRIVATE HEALTH INSURANCE | Admitting: Cardiology

## 2013-12-16 ENCOUNTER — Encounter: Payer: Self-pay | Admitting: Cardiology

## 2013-12-16 VITALS — BP 107/71 | HR 53 | Ht 65.0 in | Wt 224.0 lb

## 2013-12-16 DIAGNOSIS — I2510 Atherosclerotic heart disease of native coronary artery without angina pectoris: Secondary | ICD-10-CM

## 2013-12-16 DIAGNOSIS — F17200 Nicotine dependence, unspecified, uncomplicated: Secondary | ICD-10-CM

## 2013-12-16 DIAGNOSIS — E785 Hyperlipidemia, unspecified: Secondary | ICD-10-CM

## 2013-12-16 DIAGNOSIS — I255 Ischemic cardiomyopathy: Secondary | ICD-10-CM

## 2013-12-16 DIAGNOSIS — I10 Essential (primary) hypertension: Secondary | ICD-10-CM

## 2013-12-16 DIAGNOSIS — I2589 Other forms of chronic ischemic heart disease: Secondary | ICD-10-CM

## 2013-12-16 NOTE — Assessment & Plan Note (Signed)
Echocardiogram prior to next visit for reassessment of LVEF.

## 2013-12-16 NOTE — Assessment & Plan Note (Signed)
Continues on Pravachol and omega-3 supplements.

## 2013-12-16 NOTE — Assessment & Plan Note (Signed)
Stable on medical therapy with chronic angina. Heart rate and blood pressure very well controlled today. No changes made in regimen. Followup arranged.

## 2013-12-16 NOTE — Assessment & Plan Note (Signed)
I encouraged him to work toward smoking cessation.

## 2013-12-16 NOTE — Addendum Note (Signed)
Addended by: Eustace Moore on: 12/16/2013 12:08 PM   Modules accepted: Orders

## 2013-12-16 NOTE — Progress Notes (Signed)
Clinical Summary [name] [name] is a 48 y.o.male last seen in November 2014. Fortunately, he has not had any accelerating angina symptoms. Has stable angina in the cold weather that resolves promptly with nitroglycerin. He reports compliance with his medications. He tells me that he has stopped smoking marijuana, now is going to try and stop smoking cigarettes.  Cardiac catheterization in October 2014 showed severe 3 vessel CAD, patent LIMA to the LAD, occluded SVG to RCA, and severe stenosis within the SVG to diagonal/intermediate/OM1. He underwent placement of DES at that time. LVEF was estimated at 35-40% at that time. Prior echocardiographic assessment of LVEF had been in the range of 55-60%. He is not reporting heart failure symptoms.  Lipid panel from October 2014 showed total cholesterol 155, triglycerides 218, HDL 34, LDL 77. Dr. Olena LeatherwoodHasanaj has been following most recently with labwork in January.   No Known Allergies  Current Outpatient Prescriptions  Medication Sig Dispense Refill  . albuterol (PROVENTIL HFA;VENTOLIN HFA) 108 (90 BASE) MCG/ACT inhaler Inhale 2 puffs into the lungs every 6 (six) hours as needed for wheezing or shortness of breath.      . allopurinol (ZYLOPRIM) 300 MG tablet Take 300 mg by mouth daily as needed (for gout flare ups).      Marland Kitchen. aspirin EC 81 MG tablet Take 1 tablet (81 mg total) by mouth daily.      . baclofen (LIORESAL) 10 MG tablet Take 10 mg by mouth See admin instructions. Three times daily as needed for spasms. Typically takes 2 times a day      . carvedilol (COREG) 12.5 MG tablet Take 1 tablet (12.5 mg total) by mouth 2 (two) times daily with a meal.  60 tablet  6  . clopidogrel (PLAVIX) 75 MG tablet Take 1 tablet (75 mg total) by mouth daily.  30 tablet  11  . colchicine 0.6 MG tablet Take 0.6 mg by mouth daily as needed (for gout flare ups).      . diazepam (VALIUM) 5 MG tablet Take 5 mg by mouth 2 (two) times daily.        Marland Kitchen. HYDROcodone-acetaminophen  (NORCO/VICODIN) 5-325 MG per tablet Take 1 tablet by mouth every 6 (six) hours as needed.      . isosorbide mononitrate (IMDUR) 60 MG 24 hr tablet Take 1 tablet (60 mg total) by mouth 2 (two) times daily.  180 tablet  3  . lisinopril (PRINIVIL,ZESTRIL) 2.5 MG tablet Take 1 tablet (2.5 mg total) by mouth 2 (two) times daily.  180 tablet  3  . nitroGLYCERIN (NITROSTAT) 0.4 MG SL tablet Place 1 tablet (0.4 mg total) under the tongue every 5 (five) minutes x 3 doses as needed for chest pain.  75 tablet  0  . Omega-3 Fatty Acids (FISH OIL) 1000 MG CAPS Take 1,000 mg by mouth 2 (two) times daily.      . pantoprazole (PROTONIX) 40 MG tablet Take 40 mg by mouth daily. Obtain further refills from primary MD       . pravastatin (PRAVACHOL) 80 MG tablet Take 1 tablet (80 mg total) by mouth every evening.  90 tablet  3  . ranolazine (RANEXA) 500 MG 12 hr tablet Take 1 tablet (500 mg total) by mouth 2 (two) times daily.  180 tablet  3   No current facility-administered medications for this visit.    Past Medical History  Diagnosis Date  . Coronary atherosclerosis of native coronary artery     a. CABG  2003. b. NSTEMI 2010 s/p BMS to SVG-RCA c. PTCA/stent/asp thromb to SVG-RCA 01/2010, PTCA to SVG-RCA 04/2010 d. known occlusion of SVG-RCA 2013. e. NSTEMI 07/2013  s/p PTCA/DES to SVG-diagonal/intermediate/OM1.   . Mixed hyperlipidemia   . Bipolar disorder   . GERD (gastroesophageal reflux disease)   . Gout   . Chronic back pain   . Tobacco abuse   . Ischemic cardiomyopathy     a. EF 35-40% by cath 07/2013.  . Sinus bradycardia     Past Surgical History  Procedure Laterality Date  . Cholecystectomy    . Coronary artery bypass graft      2003, LIMA to LAD; SVG to diagonal; SVG to OM, ramus, and diagonal; SVG to RCA  . Pin placement left leg    . Metal plate to chin      Social History [name] [name] reports that he has been smoking Cigarettes.  He has a 26.4 pack-year smoking history. He has never used  smokeless tobacco. [name] [name] reports that he does not drink alcohol.  Review of Systems No palpitations or syncope. No orthopnea or PND. No reported bleeding problems. Otherwise as outlined.  Physical Examination Filed Vitals:   12/16/13 1102  BP: 107/71  Pulse: 53   Filed Weights   12/16/13 1102  Weight: 224 lb (101.606 kg)    Overweight male Smells strongly of cigarettes. Comfortable at rest.  HEENT: Conjunctiva and lids normal, oropharynx clear.  Neck: Supple, no elevated JVP or carotid bruits, no thyromegaly.  Lungs: Clear to auscultation, nonlabored breathing at rest.  Cardiac: Regular rate and rhythm, no S3, no pericardial rub.  Abdomen: Soft, nontender, bowel sounds present.  Extremities: No pitting edema, distal pulses 2+.  Skin: Warm and dry.  Musculoskeletal: No kyphosis.  Neuropsychiatric: Alert and oriented x3, affect appropriate.   Problem List and Plan   CAD, NATIVE VESSEL Stable on medical therapy with chronic angina. Heart rate and blood pressure very well controlled today. No changes made in regimen. Followup arranged.  Ischemic cardiomyopathy Echocardiogram prior to next visit for reassessment of LVEF.  TOBACCO USER I encouraged him to work toward smoking cessation.  HYPERLIPIDEMIA-MIXED Continues on Pravachol and omega-3 supplements.  Essential hypertension, benign Blood pressure well controlled today.    Jonelle Sidle, M.D., F.A.C.C.

## 2013-12-16 NOTE — Assessment & Plan Note (Signed)
Blood pressure well-controlled today. 

## 2013-12-16 NOTE — Patient Instructions (Addendum)
Your physician recommends that you schedule a follow-up appointment in: 4 months. You will receive a reminder letter in the mail in about 1-2 months reminding you to call and schedule your appointment. If you don't receive this letter, please contact our office. Your physician recommends that you continue on your current medications as directed. Please refer to the Current Medication list given to you today. Your physician has requested that you have an echocardiogram just before your next visit. Echocardiography is a painless test that uses sound waves to create images of your heart. It provides your doctor with information about the size and shape of your heart and how well your heart's chambers and valves are working. This procedure takes approximately one hour. There are no restrictions for this procedure.

## 2013-12-27 DIAGNOSIS — I2589 Other forms of chronic ischemic heart disease: Secondary | ICD-10-CM | POA: Diagnosis present

## 2013-12-27 DIAGNOSIS — Z7902 Long term (current) use of antithrombotics/antiplatelets: Secondary | ICD-10-CM

## 2013-12-27 DIAGNOSIS — E782 Mixed hyperlipidemia: Secondary | ICD-10-CM | POA: Diagnosis present

## 2013-12-27 DIAGNOSIS — I2510 Atherosclerotic heart disease of native coronary artery without angina pectoris: Secondary | ICD-10-CM | POA: Diagnosis present

## 2013-12-27 DIAGNOSIS — Z79899 Other long term (current) drug therapy: Secondary | ICD-10-CM

## 2013-12-27 DIAGNOSIS — M109 Gout, unspecified: Secondary | ICD-10-CM | POA: Diagnosis present

## 2013-12-27 DIAGNOSIS — Z7982 Long term (current) use of aspirin: Secondary | ICD-10-CM

## 2013-12-27 DIAGNOSIS — G8929 Other chronic pain: Secondary | ICD-10-CM | POA: Diagnosis present

## 2013-12-27 DIAGNOSIS — I498 Other specified cardiac arrhythmias: Secondary | ICD-10-CM | POA: Diagnosis present

## 2013-12-27 DIAGNOSIS — K219 Gastro-esophageal reflux disease without esophagitis: Secondary | ICD-10-CM | POA: Diagnosis present

## 2013-12-27 DIAGNOSIS — T82897A Other specified complication of cardiac prosthetic devices, implants and grafts, initial encounter: Principal | ICD-10-CM | POA: Diagnosis present

## 2013-12-27 DIAGNOSIS — I252 Old myocardial infarction: Secondary | ICD-10-CM

## 2013-12-27 DIAGNOSIS — I200 Unstable angina: Secondary | ICD-10-CM | POA: Diagnosis present

## 2013-12-27 DIAGNOSIS — F319 Bipolar disorder, unspecified: Secondary | ICD-10-CM | POA: Diagnosis present

## 2013-12-27 DIAGNOSIS — Z951 Presence of aortocoronary bypass graft: Secondary | ICD-10-CM

## 2013-12-27 DIAGNOSIS — Y831 Surgical operation with implant of artificial internal device as the cause of abnormal reaction of the patient, or of later complication, without mention of misadventure at the time of the procedure: Secondary | ICD-10-CM | POA: Diagnosis present

## 2013-12-27 DIAGNOSIS — F17200 Nicotine dependence, unspecified, uncomplicated: Secondary | ICD-10-CM | POA: Diagnosis present

## 2013-12-27 DIAGNOSIS — M549 Dorsalgia, unspecified: Secondary | ICD-10-CM | POA: Diagnosis present

## 2013-12-28 ENCOUNTER — Emergency Department (HOSPITAL_COMMUNITY): Payer: PRIVATE HEALTH INSURANCE

## 2013-12-28 ENCOUNTER — Encounter (HOSPITAL_COMMUNITY): Payer: Self-pay | Admitting: Emergency Medicine

## 2013-12-28 ENCOUNTER — Inpatient Hospital Stay (HOSPITAL_COMMUNITY)
Admission: EM | Admit: 2013-12-28 | Discharge: 2013-12-30 | DRG: 247 | Disposition: A | Discharge status: Home Health | Payer: PRIVATE HEALTH INSURANCE | Attending: Cardiology | Admitting: Cardiology

## 2013-12-28 DIAGNOSIS — I200 Unstable angina: Secondary | ICD-10-CM | POA: Diagnosis present

## 2013-12-28 DIAGNOSIS — I209 Angina pectoris, unspecified: Secondary | ICD-10-CM | POA: Diagnosis present

## 2013-12-28 DIAGNOSIS — E785 Hyperlipidemia, unspecified: Secondary | ICD-10-CM

## 2013-12-28 DIAGNOSIS — I2510 Atherosclerotic heart disease of native coronary artery without angina pectoris: Secondary | ICD-10-CM

## 2013-12-28 DIAGNOSIS — F17200 Nicotine dependence, unspecified, uncomplicated: Secondary | ICD-10-CM | POA: Diagnosis present

## 2013-12-28 DIAGNOSIS — I255 Ischemic cardiomyopathy: Secondary | ICD-10-CM

## 2013-12-28 DIAGNOSIS — E782 Mixed hyperlipidemia: Secondary | ICD-10-CM | POA: Diagnosis present

## 2013-12-28 DIAGNOSIS — Z9861 Coronary angioplasty status: Secondary | ICD-10-CM

## 2013-12-28 DIAGNOSIS — I10 Essential (primary) hypertension: Secondary | ICD-10-CM

## 2013-12-28 LAB — HEPARIN LEVEL (UNFRACTIONATED)
HEPARIN UNFRACTIONATED: 0.12 [IU]/mL — AB (ref 0.30–0.70)
Heparin Unfractionated: 0.24 IU/mL — ABNORMAL LOW (ref 0.30–0.70)

## 2013-12-28 LAB — TROPONIN I
Troponin I: <0.3 ng/mL (ref <0.30)
Troponin I: <0.3 ng/mL (ref <0.30)
Troponin I: <0.3 ng/mL (ref <0.30)

## 2013-12-28 LAB — CBC
HCT: 38 % — ABNORMAL LOW (ref 39.0–52.0)
HEMOGLOBIN: 13 g/dL (ref 13.0–17.0)
MCH: 33.9 pg (ref 26.0–34.0)
MCHC: 34.2 g/dL (ref 30.0–36.0)
MCV: 99 fL (ref 78.0–100.0)
PLATELETS: 176 10*3/uL (ref 150–400)
RBC: 3.84 MIL/uL — AB (ref 4.22–5.81)
RDW: 13.6 % (ref 11.5–15.5)
WBC: 5.7 10*3/uL (ref 4.0–10.5)

## 2013-12-28 LAB — CBC WITH DIFFERENTIAL/PLATELET
Basophils Absolute: 0 10*3/uL (ref 0.0–0.1)
Basophils Relative: 0 % (ref 0–1)
Eosinophils Absolute: 0.1 10*3/uL (ref 0.0–0.7)
Eosinophils Relative: 1 % (ref 0–5)
HCT: 40 % (ref 39.0–52.0)
HEMOGLOBIN: 13.8 g/dL (ref 13.0–17.0)
LYMPHS ABS: 2.1 10*3/uL (ref 0.7–4.0)
LYMPHS PCT: 37 % (ref 12–46)
MCH: 34.1 pg — ABNORMAL HIGH (ref 26.0–34.0)
MCHC: 34.5 g/dL (ref 30.0–36.0)
MCV: 98.8 fL (ref 78.0–100.0)
MONOS PCT: 7 % (ref 3–12)
Monocytes Absolute: 0.4 10*3/uL (ref 0.1–1.0)
NEUTROS ABS: 3.1 10*3/uL (ref 1.7–7.7)
NEUTROS PCT: 55 % (ref 43–77)
PLATELETS: 202 10*3/uL (ref 150–400)
RBC: 4.05 MIL/uL — AB (ref 4.22–5.81)
RDW: 13.4 % (ref 11.5–15.5)
WBC: 5.6 10*3/uL (ref 4.0–10.5)

## 2013-12-28 LAB — BASIC METABOLIC PANEL
BUN: 12 mg/dL (ref 6–23)
CO2: 24 meq/L (ref 19–32)
Calcium: 9 mg/dL (ref 8.4–10.5)
Chloride: 101 mEq/L (ref 96–112)
Creatinine, Ser: 0.82 mg/dL (ref 0.50–1.35)
GFR calc Af Amer: >90 mL/min (ref >90)
GFR calc non Af Amer: >90 mL/min (ref >90)
Glucose, Bld: 113 mg/dL — ABNORMAL HIGH (ref 70–99)
POTASSIUM: 4 meq/L (ref 3.7–5.3)
SODIUM: 138 meq/L (ref 137–147)

## 2013-12-28 LAB — I-STAT CHEM 8, ED
BUN: 9 mg/dL (ref 6–23)
CREATININE: 0.9 mg/dL (ref 0.50–1.35)
Calcium, Ion: 1.23 mmol/L (ref 1.12–1.23)
Chloride: 103 mEq/L (ref 96–112)
Glucose, Bld: 98 mg/dL (ref 70–99)
HEMATOCRIT: 43 % (ref 39.0–52.0)
Hemoglobin: 14.6 g/dL (ref 13.0–17.0)
POTASSIUM: 3.8 meq/L (ref 3.7–5.3)
SODIUM: 142 meq/L (ref 137–147)
TCO2: 25 mmol/L (ref 0–100)

## 2013-12-28 LAB — APTT: aPTT: 27 seconds (ref 24–37)

## 2013-12-28 LAB — LIPID PANEL
CHOL/HDL RATIO: 4.5 ratio
Cholesterol: 113 mg/dL (ref 0–200)
HDL: 25 mg/dL — ABNORMAL LOW (ref >39)
LDL Cholesterol: 47 mg/dL (ref 0–99)
Triglycerides: 204 mg/dL — ABNORMAL HIGH (ref <150)
VLDL: 41 mg/dL — AB (ref 0–40)

## 2013-12-28 LAB — I-STAT TROPONIN, ED: TROPONIN I, POC: 0.01 ng/mL (ref 0.00–0.08)

## 2013-12-28 LAB — PROTIME-INR
INR: 0.99 (ref 0.00–1.49)
INR: 0.99 (ref 0.00–1.49)
PROTHROMBIN TIME: 12.9 s (ref 11.6–15.2)
PROTHROMBIN TIME: 12.9 s (ref 11.6–15.2)

## 2013-12-28 MED ORDER — ISOSORBIDE MONONITRATE ER 60 MG PO TB24
60.0000 mg | ORAL_TABLET | Freq: Two times a day (BID) | ORAL | Status: DC
  Administered 2013-12-28 – 2013-12-30 (×4): 60 mg via ORAL
  Filled 2013-12-28 (×8): qty 1

## 2013-12-28 MED ORDER — NITROGLYCERIN 0.4 MG SL SUBL: 0.4000 mg | SUBLINGUAL_TABLET | SUBLINGUAL | Status: DC | PRN

## 2013-12-28 MED ORDER — HEPARIN BOLUS VIA INFUSION
1500.0000 [IU] | Freq: Once | INTRAVENOUS | Status: AC
  Administered 2013-12-28: 1500 [IU] via INTRAVENOUS
  Filled 2013-12-28: qty 1500

## 2013-12-28 MED ORDER — ASPIRIN 325 MG PO TABS
325.0000 mg | ORAL_TABLET | ORAL | Status: AC
  Administered 2013-12-28: 325 mg via ORAL
  Filled 2013-12-28: qty 1

## 2013-12-28 MED ORDER — HEPARIN (PORCINE) IN NACL 100-0.45 UNIT/ML-% IJ SOLN
1000.0000 [IU]/h | Freq: Once | INTRAMUSCULAR | Status: AC
  Administered 2013-12-28: 1000 [IU]/h via INTRAVENOUS
  Filled 2013-12-28: qty 250

## 2013-12-28 MED ORDER — SIMVASTATIN 20 MG PO TABS
20.0000 mg | ORAL_TABLET | Freq: Every day | ORAL | Status: DC
  Administered 2013-12-28 – 2013-12-29 (×2): 20 mg via ORAL
  Filled 2013-12-28 (×4): qty 1

## 2013-12-28 MED ORDER — SODIUM CHLORIDE 0.9 % IJ SOLN: 3.0000 mL | INTRAMUSCULAR | Status: DC | PRN

## 2013-12-28 MED ORDER — ASPIRIN EC 81 MG PO TBEC
81.0000 mg | DELAYED_RELEASE_TABLET | Freq: Every day | ORAL | Status: DC
  Administered 2013-12-28 – 2013-12-30 (×3): 81 mg via ORAL
  Filled 2013-12-28 (×3): qty 1

## 2013-12-28 MED ORDER — IPRATROPIUM-ALBUTEROL 0.5-2.5 (3) MG/3ML IN SOLN
3.0000 mL | RESPIRATORY_TRACT | Status: DC
  Administered 2013-12-28: 3 mL via RESPIRATORY_TRACT
  Filled 2013-12-28: qty 3

## 2013-12-28 MED ORDER — IPRATROPIUM-ALBUTEROL 0.5-2.5 (3) MG/3ML IN SOLN: 3.0000 mL | RESPIRATORY_TRACT | Status: DC | PRN

## 2013-12-28 MED ORDER — MORPHINE SULFATE 4 MG/ML IJ SOLN
4.0000 mg | Freq: Once | INTRAMUSCULAR | Status: AC
  Administered 2013-12-28: 4 mg via INTRAVENOUS
  Filled 2013-12-28: qty 1

## 2013-12-28 MED ORDER — ALLOPURINOL 300 MG PO TABS
300.0000 mg | ORAL_TABLET | Freq: Every day | ORAL | Status: DC | PRN
  Filled 2013-12-28: qty 1

## 2013-12-28 MED ORDER — SODIUM CHLORIDE 0.9 % IV SOLN: 250.0000 mL | INTRAVENOUS | Status: DC | PRN

## 2013-12-28 MED ORDER — HEPARIN BOLUS VIA INFUSION
2000.0000 [IU] | Freq: Once | INTRAVENOUS | Status: AC
  Administered 2013-12-28: 2000 [IU] via INTRAVENOUS
  Filled 2013-12-28: qty 2000

## 2013-12-28 MED ORDER — ASPIRIN 81 MG PO CHEW
81.0000 mg | CHEWABLE_TABLET | ORAL | Status: AC
  Administered 2013-12-29: 81 mg via ORAL
  Filled 2013-12-28: qty 1

## 2013-12-28 MED ORDER — PANTOPRAZOLE SODIUM 40 MG PO TBEC
40.0000 mg | DELAYED_RELEASE_TABLET | Freq: Every day | ORAL | Status: DC
  Administered 2013-12-28 – 2013-12-30 (×3): 40 mg via ORAL
  Filled 2013-12-28 (×3): qty 1

## 2013-12-28 MED ORDER — CLOPIDOGREL BISULFATE 75 MG PO TABS
75.0000 mg | ORAL_TABLET | Freq: Every day | ORAL | Status: DC
  Administered 2013-12-28 – 2013-12-30 (×3): 75 mg via ORAL
  Filled 2013-12-28 (×4): qty 1

## 2013-12-28 MED ORDER — HEPARIN (PORCINE) IN NACL 100-0.45 UNIT/ML-% IJ SOLN
1450.0000 [IU]/h | INTRAMUSCULAR | Status: DC
  Administered 2013-12-28 (×2): 1200 [IU]/h via INTRAVENOUS
  Administered 2013-12-29: 1450 [IU]/h via INTRAVENOUS
  Filled 2013-12-28 (×3): qty 250

## 2013-12-28 MED ORDER — RANOLAZINE ER 500 MG PO TB12
500.0000 mg | ORAL_TABLET | Freq: Two times a day (BID) | ORAL | Status: DC
  Administered 2013-12-28 – 2013-12-30 (×4): 500 mg via ORAL
  Filled 2013-12-28 (×7): qty 1

## 2013-12-28 MED ORDER — CARVEDILOL 12.5 MG PO TABS
12.5000 mg | ORAL_TABLET | Freq: Two times a day (BID) | ORAL | Status: DC
  Administered 2013-12-28: 12.5 mg via ORAL
  Filled 2013-12-28 (×7): qty 1

## 2013-12-28 MED ORDER — OXYCODONE HCL 5 MG PO TABS
5.0000 mg | ORAL_TABLET | Freq: Once | ORAL | Status: AC
  Administered 2013-12-28: 5 mg via ORAL
  Filled 2013-12-28: qty 1

## 2013-12-28 MED ORDER — SODIUM CHLORIDE 0.9 % IV SOLN
1.0000 mL/kg/h | INTRAVENOUS | Status: DC
  Administered 2013-12-29: 1 mL/kg/h via INTRAVENOUS

## 2013-12-28 MED ORDER — SODIUM CHLORIDE 0.9 % IJ SOLN
3.0000 mL | Freq: Two times a day (BID) | INTRAMUSCULAR | Status: DC
  Administered 2013-12-28: 3 mL via INTRAVENOUS

## 2013-12-28 MED ORDER — HYDROCODONE-ACETAMINOPHEN 5-325 MG PO TABS
1.0000 | ORAL_TABLET | Freq: Four times a day (QID) | ORAL | Status: DC | PRN
  Administered 2013-12-28 – 2013-12-30 (×5): 1 via ORAL
  Filled 2013-12-28 (×5): qty 1

## 2013-12-28 MED ORDER — LISINOPRIL 2.5 MG PO TABS
2.5000 mg | ORAL_TABLET | Freq: Two times a day (BID) | ORAL | Status: DC
  Administered 2013-12-28 – 2013-12-30 (×3): 2.5 mg via ORAL
  Filled 2013-12-28 (×7): qty 1

## 2013-12-28 NOTE — Progress Notes (Signed)
Patient seen and examined. Agree with H&P. He presents with symptoms consistent with unstable angina. Plan cardiac catheterization tomorrow. The risks and benefits were discussed and the patient agrees to proceed. Continue present medications. Olga Millers

## 2013-12-28 NOTE — ED Provider Notes (Signed)
CSN: [id number]     Arrival date & time 12/27/13  2359 History  This chart was scribed for Joya Gaskins, MD by Dorothey Baseman, ED Scribe. This patient was seen in room APA04/APA04 and the patient's care was started at 12:25 AM.    Chief Complaint  Patient presents with  . Chest Pain   Patient is a 48 y.o. male presenting with chest pain. The history is provided by the patient. No language interpreter was used.  Chest Pain Pain location:  Substernal area Pain quality: pressure   Pain severity:  Moderate Onset quality:  Sudden Timing:  Intermittent Progression:  Worsening Chronicity:  New Relieved by:  Nitroglycerin and rest Ineffective treatments:  Nitroglycerin (ineffective today) Associated symptoms: shortness of breath   Associated symptoms: no abdominal pain, no diaphoresis, no fever, no syncope, not vomiting and no weakness   Shortness of breath:    Severity:  Moderate   Onset quality:  Gradual   Timing:  Intermittent   Progression:  Unchanged Risk factors: coronary artery disease, high cholesterol, male sex, obesity and smoking    HPI Comments: [name] [name] is a 48 y.o. male with a history of coronary atherosclerosis of native coronary artery, hyperlipidemia, ischemic cardiomyopathy, sinus bradycardia, and a surgical history of CABG who presents to the Emergency Department complaining of an intermittent pain to the substernal region of the chest, described as a pressure that is rated 3/10 currently, onset about a week ago. Patient reports that he has the pain daily, but the episodes have been gradually increasing in severity and frequency. Patient reports associated shortness of breath during episodes of pain. Patient reports that he has been taking nitroglycerin at home with temporary relief of the pain initially, but that he took 5 doses of nitroglycerin today, which was unable to provide significant relief of his symptoms.  He states that the pain is also alleviated with  rest. He reports some pain to the lower back that he states is chronic in nature and denies any recent changes. He denies diaphoresis, syncope, fever, emesis, abdominal pain, weakness, and denies blood in the stool. Patient also has a history of GERD. Patient is a current, every day smoker  Past Medical History  Diagnosis Date  . Coronary atherosclerosis of native coronary artery     a. CABG 2003. b. NSTEMI 2010 s/p BMS to SVG-RCA c. PTCA/stent/asp thromb to SVG-RCA 01/2010, PTCA to SVG-RCA 04/2010 d. known occlusion of SVG-RCA 2013. e. NSTEMI 07/2013  s/p PTCA/DES to SVG-diagonal/intermediate/OM1.   . Mixed hyperlipidemia   . Bipolar disorder   . GERD (gastroesophageal reflux disease)   . Gout   . Chronic back pain   . Tobacco abuse   . Ischemic cardiomyopathy     a. EF 35-40% by cath 07/2013.  . Sinus bradycardia    Past Surgical History  Procedure Laterality Date  . Cholecystectomy    . Coronary artery bypass graft      2003, LIMA to LAD; SVG to diagonal; SVG to OM, ramus, and diagonal; SVG to RCA  . Pin placement left leg    . Metal plate to chin     Family History  Problem Relation Age of Onset  . Stroke      family Hx    History  Substance Use Topics  . Smoking status: Current Every Day Smoker -- 0.80 packs/day for 33 years    Types: Cigarettes  . Smokeless tobacco: Never Used     Comment: trying to  quit, 3/4 pack per day  . Alcohol Use: No    Review of Systems  Constitutional: Negative for fever and diaphoresis.  Respiratory: Positive for shortness of breath.   Cardiovascular: Positive for chest pain. Negative for syncope.  Gastrointestinal: Negative for vomiting, abdominal pain and blood in stool.  Neurological: Negative for syncope and weakness.  All other systems reviewed and are negative.      Allergies  Review of patient's allergies indicates no known allergies.  Home Medications   Current Outpatient Rx  Name  Route  Sig  Dispense  Refill  .  albuterol (PROVENTIL HFA;VENTOLIN HFA) 108 (90 BASE) MCG/ACT inhaler   Inhalation   Inhale 2 puffs into the lungs every 6 (six) hours as needed for wheezing or shortness of breath.         . allopurinol (ZYLOPRIM) 300 MG tablet   Oral   Take 300 mg by mouth daily as needed (for gout flare ups).         Marland Kitchen. aspirin EC 81 MG tablet   Oral   Take 1 tablet (81 mg total) by mouth daily.         . baclofen (LIORESAL) 10 MG tablet   Oral   Take 10 mg by mouth See admin instructions. Three times daily as needed for spasms. Typically takes 2 times a day         . carvedilol (COREG) 12.5 MG tablet   Oral   Take 1 tablet (12.5 mg total) by mouth 2 (two) times daily with a meal.   60 tablet   6   . clopidogrel (PLAVIX) 75 MG tablet   Oral   Take 1 tablet (75 mg total) by mouth daily.   30 tablet   11   . colchicine 0.6 MG tablet   Oral   Take 0.6 mg by mouth daily as needed (for gout flare ups).         . diazepam (VALIUM) 5 MG tablet   Oral   Take 5 mg by mouth 2 (two) times daily.           Marland Kitchen. HYDROcodone-acetaminophen (NORCO/VICODIN) 5-325 MG per tablet   Oral   Take 1 tablet by mouth every 6 (six) hours as needed.         . isosorbide mononitrate (IMDUR) 60 MG 24 hr tablet   Oral   Take 1 tablet (60 mg total) by mouth 2 (two) times daily.   180 tablet   3   . lisinopril (PRINIVIL,ZESTRIL) 2.5 MG tablet   Oral   Take 1 tablet (2.5 mg total) by mouth 2 (two) times daily.   180 tablet   3   . nitroGLYCERIN (NITROSTAT) 0.4 MG SL tablet   Sublingual   Place 1 tablet (0.4 mg total) under the tongue every 5 (five) minutes x 3 doses as needed for chest pain.   75 tablet   0   . Omega-3 Fatty Acids (FISH OIL) 1000 MG CAPS   Oral   Take 1,000 mg by mouth 2 (two) times daily.         . pantoprazole (PROTONIX) 40 MG tablet   Oral   Take 40 mg by mouth daily. Obtain further refills from primary MD          . pravastatin (PRAVACHOL) 80 MG tablet   Oral    Take 1 tablet (80 mg total) by mouth every evening.   90 tablet   3   .  ranolazine (RANEXA) 500 MG 12 hr tablet   Oral   Take 1 tablet (500 mg total) by mouth 2 (two) times daily.   180 tablet   3    Triage Vitals: BP 146/117  Pulse 59  Temp(Src) 97.7 F (36.5 C) (Oral)  Resp 16  Ht 5\' 4"  (1.626 m)  Wt 224 lb (101.606 kg)  BMI 38.43 kg/m2  SpO2 96%  Physical Exam  Nursing note and vitals reviewed.  CONSTITUTIONAL: Well developed/well nourished HEAD: Normocephalic/atraumatic EYES: EOMI/PERRL ENMT: Mucous membranes moist NECK: supple no meningeal signs SPINE:entire spine nontender CV: S1/S2 noted, no murmurs/rubs/gallops noted LUNGS: Lungs are clear to auscultation bilaterally, no apparent distress ABDOMEN: soft, nontender, no rebound or guarding GU:no cva tenderness NEURO: Pt is awake/alert, moves all extremitiesx4, no focal weakness in the lower extremities EXTREMITIES: pulses normal, full ROM, no calf tenderness or edema noted SKIN: warm, color normal PSYCH: no abnormalities of mood noted  ED Course  Procedures  CRITICAL CARE Performed by: Joya Gaskins Total critical care time: 33 Critical care time was exclusive of separately billable procedures and treating other patients. Critical care was necessary to treat or prevent imminent or life-threatening deterioration. Critical care was time spent personally by me on the following activities: development of treatment plan with patient and/or surrogate as well as nursing, discussions with consultants, evaluation of patient's response to treatment, examination of patient, obtaining history from patient or surrogate, ordering and performing treatments and interventions, ordering and review of laboratory studies, ordering and review of radiographic studies, pulse oximetry and re-evaluation of patient's condition.   DIAGNOSTIC STUDIES: Oxygen Saturation is 96% on room air, normal by my interpretation.    COORDINATION  OF CARE: 12:29 AM- Will order blood labs (APTT, I-stat chem 8, protime-INR, CBC, I-stat troponin) and a chest x-ray. Will order heparin, aspirin, and nitroglycerin to manage symptoms. Discussed that the patient will likely need to be admitted to the hospital. Discussed treatment plan with patient at bedside and patient verbalized agreement.   1:40 AM Pt now pain free He has been given heparin drip for unstable angina He is currently stable but will need transfer to [place] for further cardiac evaluation as he high risk for worsening D/w dr Verdie Mosher, on call for cardiology, will admit to tele (pt currently CP free) Pt agreeable with plan   Labs Review Labs Reviewed  CBC WITH DIFFERENTIAL - Abnormal; Notable for the following:    RBC 4.05 (*)    MCH 34.1 (*)    All other components within normal limits  PROTIME-INR  APTT  I-STAT TROPOININ, ED  I-STAT CHEM 8, ED   Imaging Review Dg Chest Port 1 View  12/28/2013   CLINICAL DATA:  One week history of intermittent chest pain and pressure. Prior CABG. Current smoker.  EXAM: PORTABLE CHEST - 1 VIEW  COMPARISON:  DG CHEST 1V PORT dated 07/13/2013; DG CHEST 1V PORT dated 02/05/2013; DG RIBS W/ CHEST 3+V*L* dated 06/05/2012; DG CHEST 2V dated 03/18/2012  FINDINGS: Prior sternotomy for CABG. Cardiac silhouette mildly enlarged but stable. Lungs clear. Bronchovascular markings normal. Pulmonary vascularity normal. No visible pleural effusions. No pneumothorax.  IMPRESSION: Stable mild cardiomegaly. No acute cardiopulmonary disease.   Electronically Signed   By: Hulan Saas M.D.   On: 12/28/2013 01:15     EKG Interpretation   Date/Time:  Sunday December 28 2013 00:06:18 EDT Ventricular Rate:  60 PR Interval:  178 QRS Duration: 86 QT Interval:  414 QTC Calculation: 414 R Axis:  82 Text Interpretation:  Normal sinus rhythm Nonspecific ST and T wave  abnormality Abnormal ECG When compared with ECG of 15-Jul-2013 05:51,  Nonspecific T wave  abnormality has replaced inverted T waves in Inferior  leads Nonspecific T wave abnormality has replaced inverted T waves in  Lateral leads Confirmed by Bebe Shaggy  MD, Dorinda Hill ([id number]) on 12/28/2013  12:42:40 AM      MDM   Final diagnoses:  Unstable angina    Nursing notes including past medical history and social history reviewed and considered in documentation xrays reviewed and considered Labs/vital reviewed and considered Previous records reviewed and considered - h/o CAD    I personally performed the services described in this documentation, which was scribed in my presence. The recorded information has been reviewed and is accurate.       Joya Gaskins, MD 12/28/13 [phone]

## 2013-12-28 NOTE — H&P (Signed)
Physician History and Physical    LANNY LIPKIN MRN: [id number] DOB/AGE: 1966-06-24 48 y.o. Admit date: 12/28/2013   Primary Cardiologist:  Diona Browner  CC:  Chest pain  HPI:  [name] [name] is a 48 y.o. male with a history of CAD s/p CABG x 3, hyperlipidemia, ischemic cardiomyopathy EF 35-40%, with most recent NSTEMI in 07/2013 and most recent cath at that time showing patent LIMA to LAD, occluded SVG to RCA and high grade disease in SVG to dignonal/ramus/OM1 s/p DES placement, who presented to [facility] for intermittent exertional chest pain with increased intensity and frequency over last 1.5 weeks.   He said he was doing well after last stent till about 1 and half weeks ago when he woke up at middle of night with chest pain and peed on himself. The pain increased over the next week to the extent that he needed to take 4-5 nitro at times.   He is compliant with his ASA and Plavix but continue to smoking about 1 PPD. He quit Marijuana 5 months ago and do not use other drugs.   Upon transfer to [facility], patient denied any active chest pain. He denied any association of SOB/dyspnea/diaphoresis/syncope with the chest pain. His main complaint was back pain at that time.  Heparin gtt was started at [facility] ED.    Review of systems: A review of 10 organ systems was done and is negative except as stated above in HPI  Past Medical History  Diagnosis Date  . Coronary atherosclerosis of native coronary artery     a. CABG 2003. b. NSTEMI 2010 s/p BMS to SVG-RCA c. PTCA/stent/asp thromb to SVG-RCA 01/2010, PTCA to SVG-RCA 04/2010 d. known occlusion of SVG-RCA 2013. e. NSTEMI 07/2013  s/p PTCA/DES to SVG-diagonal/intermediate/OM1.   . Mixed hyperlipidemia   . Bipolar disorder   . GERD (gastroesophageal reflux disease)   . Gout   . Chronic back pain   . Tobacco abuse   . Ischemic cardiomyopathy     a. EF 35-40% by cath 07/2013.  . Sinus bradycardia    Past Surgical History  Procedure  Laterality Date  . Cholecystectomy    . Coronary artery bypass graft      2003, LIMA to LAD; SVG to diagonal; SVG to OM, ramus, and diagonal; SVG to RCA  . Pin placement left leg    . Metal plate to chin     History   Social History  . Marital Status: Married    Spouse Name: N/A    Number of Children: N/A  . Years of Education: N/A   Occupational History  . Not on file.   Social History Main Topics  . Smoking status: Current Every Day Smoker -- 0.80 packs/day for 33 years    Types: Cigarettes  . Smokeless tobacco: Never Used     Comment: trying to quit, 3/4 pack per day  . Alcohol Use: No  . Drug Use: Yes     Comment: Still uses marijuana but has DC cocaine use  . Sexual Activity: Not on file   Other Topics Concern  . Not on file   Social History Narrative   Married.     Family History  Problem Relation Age of Onset  . Stroke      family Hx      No Known Allergies  Current Facility-Administered Medications  Medication Dose Route Frequency Provider Last Rate Last Dose  . nitroGLYCERIN (NITROSTAT) SL tablet 0.4 mg  0.4 mg Sublingual Q5 min PRN Joya Gaskinsonald W Wickline, MD       Current Outpatient Prescriptions  Medication Sig Dispense Refill  . albuterol (PROVENTIL HFA;VENTOLIN HFA) 108 (90 BASE) MCG/ACT inhaler Inhale 2 puffs into the lungs every 6 (six) hours as needed for wheezing or shortness of breath.      . allopurinol (ZYLOPRIM) 300 MG tablet Take 300 mg by mouth daily as needed (for gout flare ups).      Marland Kitchen. aspirin EC 81 MG tablet Take 1 tablet (81 mg total) by mouth daily.      . baclofen (LIORESAL) 10 MG tablet Take 10 mg by mouth See admin instructions. Three times daily as needed for spasms. Typically takes 2 times a day      . carvedilol (COREG) 12.5 MG tablet Take 1 tablet (12.5 mg total) by mouth 2 (two) times daily with a meal.  60 tablet  6  . clopidogrel (PLAVIX) 75 MG tablet Take 1 tablet (75 mg total) by mouth daily.  30 tablet  11  . colchicine 0.6 MG  tablet Take 0.6 mg by mouth daily as needed (for gout flare ups).      . diazepam (VALIUM) 5 MG tablet Take 5 mg by mouth 2 (two) times daily.        Marland Kitchen. HYDROcodone-acetaminophen (NORCO/VICODIN) 5-325 MG per tablet Take 1 tablet by mouth every 6 (six) hours as needed.      . isosorbide mononitrate (IMDUR) 60 MG 24 hr tablet Take 1 tablet (60 mg total) by mouth 2 (two) times daily.  180 tablet  3  . lisinopril (PRINIVIL,ZESTRIL) 2.5 MG tablet Take 1 tablet (2.5 mg total) by mouth 2 (two) times daily.  180 tablet  3  . nitroGLYCERIN (NITROSTAT) 0.4 MG SL tablet Place 1 tablet (0.4 mg total) under the tongue every 5 (five) minutes x 3 doses as needed for chest pain.  75 tablet  0  . Omega-3 Fatty Acids (FISH OIL) 1000 MG CAPS Take 1,000 mg by mouth 2 (two) times daily.      . pantoprazole (PROTONIX) 40 MG tablet Take 40 mg by mouth daily. Obtain further refills from primary MD       . pravastatin (PRAVACHOL) 80 MG tablet Take 1 tablet (80 mg total) by mouth every evening.  90 tablet  3  . ranolazine (RANEXA) 500 MG 12 hr tablet Take 1 tablet (500 mg total) by mouth 2 (two) times daily.  180 tablet  3    Physical Exam: Blood pressure 108/64, pulse 59, temperature 97.7 F (36.5 C), temperature source Oral, resp. rate 23, height 5\' 4"  (1.626 m), weight 224 lb (101.606 kg), SpO2 94.00%.; Body mass index is 38.43 kg/(m^2). Temp:  [97.7 F (36.5 C)] 97.7 F (36.5 C) (03/22 0013) Pulse Rate:  [55-63] 59 (03/22 0100) Resp:  [12-23] 23 (03/22 0100) BP: (96-146)/(52-117) 108/64 mmHg (03/22 0100) SpO2:  [92 %-96 %] 94 % (03/22 0100) Weight:  [224 lb (101.606 kg)] 224 lb (101.606 kg) (03/22 0013)  No intake or output data in the 24 hours ending 12/28/13 0130 General: NAD Heent: MMM Neck: No JVD  CV: Nondisplaced PMI.  RRR, nl S1/S2, no S3/S4, no murmur. No carotid bruit   Lungs: Clear to auscultation bilaterally with normal respiratory effort Abdomen: Soft, nontender, nondistended Extremities: No  clubbing or cyanosis.  Normal pedal pulses. No pedal edema Skin: Intact without lesions or rashes  Neurologic: Alert and oriented x 3, grossly nonfocal  Psych: Normal  mood and affect    Labs: No results found for this basename: CKTOTAL, CKMB, TROPONINI,  in the last 72 hours Lab Results  Component Value Date   WBC 5.6 12/28/2013   HGB 14.6 12/28/2013   HCT 43.0 12/28/2013   MCV 98.8 12/28/2013   PLT 202 12/28/2013    Recent Labs Lab 12/28/13 0038  NA 142  K 3.8  CL 103  BUN 9  CREATININE 0.90  GLUCOSE 98   Lab Results  Component Value Date   CHOL 155 07/13/2013   HDL 34* 07/13/2013   LDLCALC 77 07/13/2013   TRIG 218* 07/13/2013       EKG:  NSR with non-specific TW changes  Radiology:  Dg Chest Port 1 View  12/28/2013   CLINICAL DATA:  One week history of intermittent chest pain and pressure. Prior CABG. Current smoker.  EXAM: PORTABLE CHEST - 1 VIEW  COMPARISON:  DG CHEST 1V PORT dated 07/13/2013; DG CHEST 1V PORT dated 02/05/2013; DG RIBS W/ CHEST 3+V*L* dated 06/05/2012; DG CHEST 2V dated 03/18/2012  FINDINGS: Prior sternotomy for CABG. Cardiac silhouette mildly enlarged but stable. Lungs clear. Bronchovascular markings normal. Pulmonary vascularity normal. No visible pleural effusions. No pneumothorax.  IMPRESSION: Stable mild cardiomegaly. No acute cardiopulmonary disease.   Electronically Signed   By: Hulan Saas M.D.   On: 12/28/2013 01:15    ASSESSMENT:  48 yo male with significant h/o CAD s/p CABG with last LHC 07/2013 showing patent LIMA to LAD, occluded SVG to RCA and high grade disease in SVG to dignonal/ramus/OM1 s/p DES placement  1. Unstable angina, now chest pain free 2. ICM with EF 35-40%, relatively euvolumic  PLAN:  1. Cycle cardiac enzymes x3 Q6hrs, continue Heparin gtt till then if no elevation, continue Heparin x48 hrs if Troponin elevated  2. Continue DAPT with ASA/Plavix, Continue Coreg, ACEI and pravastatin.  Nitro prn for recurrent chest pain 3. If  Troponin negative, Cardiolite nuclear stress test in the AM due to complex coronary anatomy; if Troponin significantly elevated, proceed with LHC.   Signed: Haydee Salter, MD Cardiology Fellow 12/28/2013, 1:30 AM

## 2013-12-28 NOTE — Progress Notes (Signed)
ANTICOAGULATION CONSULT NOTE - Follow Up Consult  Pharmacy Consult for Heparin Indication: chest pain/ACS  No Known Allergies  Patient Measurements: Height: 5\' 3"  (160 cm) Weight: 220 lb 0.3 oz (99.8 kg) IBW/kg (Calculated) : 56.9 Heparin Dosing Weight:   Vital Signs: Temp: 97.6 F (36.4 C) (03/22 0318) Temp src: Oral (03/22 0318) BP: 108/71 mmHg (03/22 0318) Pulse Rate: 52 (03/22 0318)  Labs:  Recent Labs  12/28/13 0017 12/28/13 0038 12/28/13 0530 12/28/13 1200  HGB 13.8 14.6 13.0  --   HCT 40.0 43.0 38.0*  --   PLT 202  --  176  --   APTT 27  --   --   --   LABPROT 12.9  --  12.9  --   INR 0.99  --  0.99  --   HEPARINUNFRC  --   --   --  0.12*  CREATININE  --  0.90 0.82  --   TROPONINI  --   --  <0.30 <0.30    Estimated Creatinine Clearance: 116.7 ml/min (by C-G formula based on Cr of 0.82).   Medications:  Scheduled:  . aspirin EC  81 mg Oral Daily  . carvedilol  12.5 mg Oral BID WC  . clopidogrel  75 mg Oral Q breakfast  . isosorbide mononitrate  60 mg Oral BID WC  . lisinopril  2.5 mg Oral BID  . pantoprazole  40 mg Oral Daily  . ranolazine  500 mg Oral BID  . simvastatin  20 mg Oral q1800    Assessment: 48yo male with ACS on heparin 1000 units/hr, with plans for cath on 3/23.  HL is 0.12, Hg and pltc are wnl.  No bleeding noted.    Goal of Therapy:  Heparin level 0.3-0.7 units/ml Monitor platelets by anticoagulation protocol: Yes   Plan:  1-  Heparin 2000 units IV x 1, inc to 1200 units/hr 2-  Repeat HL in 6hr  Marisue Humble, PharmD Clinical Pharmacist [facility]

## 2013-12-28 NOTE — Progress Notes (Signed)
Utilization review completed.  

## 2013-12-28 NOTE — ED Notes (Signed)
Intermittent chest pressure x 1 week.  Has been taking NTG tabs daily which helped, but today has had 5 NTG which help until he starts activity again.  Has extensive cardiac hx including 5 quadruple bypass in 2003

## 2013-12-28 NOTE — Progress Notes (Signed)
12/28/13  Pharmacy-  Heparin 2050  Heparin level 0.24 on 1200 units/hr  A/P:  48yo male with chest pain/ACS.  Follow-up heparin level subtherapeutic; IV was out for a brief period of time ~1530 today.  No bleeding problems noted and no other issues with IV.  1-  Heparin 1500 units IV x 1, increase to 1350 units/hr 2-  Repeat HL 6hr  Marisue Humble, PharmD Clinical Pharmacist [facility]

## 2013-12-28 NOTE — Progress Notes (Signed)
ANTICOAGULATION CONSULT NOTE - Initial Consult  Pharmacy Consult for Heparin Indication: chest pain/ACS  No Known Allergies  Patient Measurements: Height: 5\' 3"  (160 cm) Weight: 219 lb 14.4 oz (99.746 kg) IBW/kg (Calculated) : 56.9 Heparin Dosing Weight: 80 kg  Vital Signs: Temp: 97.6 F (36.4 C) (03/22 0318) Temp src: Oral (03/22 0318) BP: 108/71 mmHg (03/22 0318) Pulse Rate: 52 (03/22 0318)  Labs:  Recent Labs  12/28/13 0017 12/28/13 0038  HGB 13.8 14.6  HCT 40.0 43.0  PLT 202  --   APTT 27  --   LABPROT 12.9  --   INR 0.99  --   CREATININE  --  0.90    Estimated Creatinine Clearance: 106.2 ml/min (by C-G formula based on Cr of 0.9).   Medical History: Past Medical History  Diagnosis Date  . Coronary atherosclerosis of native coronary artery     a. CABG 2003. b. NSTEMI 2010 s/p BMS to SVG-RCA c. PTCA/stent/asp thromb to SVG-RCA 01/2010, PTCA to SVG-RCA 04/2010 d. known occlusion of SVG-RCA 2013. e. NSTEMI 07/2013  s/p PTCA/DES to SVG-diagonal/intermediate/OM1.   . Mixed hyperlipidemia   . Bipolar disorder   . GERD (gastroesophageal reflux disease)   . Gout   . Chronic back pain   . Tobacco abuse   . Ischemic cardiomyopathy     a. EF 35-40% by cath 07/2013.  . Sinus bradycardia     Medications:  Prescriptions prior to admission  Medication Sig Dispense Refill  . albuterol (PROVENTIL HFA;VENTOLIN HFA) 108 (90 BASE) MCG/ACT inhaler Inhale 2 puffs into the lungs every 6 (six) hours as needed for wheezing or shortness of breath.      . allopurinol (ZYLOPRIM) 300 MG tablet Take 300 mg by mouth daily as needed (for gout flare ups).      Marland Kitchen. aspirin EC 81 MG tablet Take 1 tablet (81 mg total) by mouth daily.      . baclofen (LIORESAL) 10 MG tablet Take 10 mg by mouth See admin instructions. Three times daily as needed for spasms. Typically takes 2 times a day      . carvedilol (COREG) 12.5 MG tablet Take 1 tablet (12.5 mg total) by mouth 2 (two) times daily with a  meal.  60 tablet  6  . clopidogrel (PLAVIX) 75 MG tablet Take 1 tablet (75 mg total) by mouth daily.  30 tablet  11  . colchicine 0.6 MG tablet Take 0.6 mg by mouth daily as needed (for gout flare ups).      . diazepam (VALIUM) 5 MG tablet Take 5 mg by mouth 2 (two) times daily.        Marland Kitchen. HYDROcodone-acetaminophen (NORCO/VICODIN) 5-325 MG per tablet Take 1 tablet by mouth every 6 (six) hours as needed.      . isosorbide mononitrate (IMDUR) 60 MG 24 hr tablet Take 1 tablet (60 mg total) by mouth 2 (two) times daily.  180 tablet  3  . lisinopril (PRINIVIL,ZESTRIL) 2.5 MG tablet Take 1 tablet (2.5 mg total) by mouth 2 (two) times daily.  180 tablet  3  . nitroGLYCERIN (NITROSTAT) 0.4 MG SL tablet Place 1 tablet (0.4 mg total) under the tongue every 5 (five) minutes x 3 doses as needed for chest pain.  75 tablet  0  . Omega-3 Fatty Acids (FISH OIL) 1000 MG CAPS Take 1,000 mg by mouth 2 (two) times daily.      . pantoprazole (PROTONIX) 40 MG tablet Take 40 mg by mouth daily. Obtain further  refills from primary MD       . pravastatin (PRAVACHOL) 80 MG tablet Take 1 tablet (80 mg total) by mouth every evening.  90 tablet  3  . ranolazine (RANEXA) 500 MG 12 hr tablet Take 1 tablet (500 mg total) by mouth 2 (two) times daily.  180 tablet  3    Assessment: 48 yo male with chest pain for heparin  Heparin 1000 units/hr started at [facility] at 0040   Goal of Therapy:  Heparin level 0.3-0.7 units/ml Monitor platelets by anticoagulation protocol: Yes   Plan:  Continue Heparin at current rate  Check heparin level in 6 hours.  [name] [name] 12/28/2013,4:12 AM

## 2013-12-29 ENCOUNTER — Encounter (HOSPITAL_COMMUNITY)
Admission: EM | Disposition: A | Discharge status: Home Health | Payer: Self-pay | Source: Home / Self Care | Attending: Cardiology

## 2013-12-29 DIAGNOSIS — I2510 Atherosclerotic heart disease of native coronary artery without angina pectoris: Secondary | ICD-10-CM

## 2013-12-29 DIAGNOSIS — Z9861 Coronary angioplasty status: Secondary | ICD-10-CM

## 2013-12-29 HISTORY — PX: LEFT HEART CATHETERIZATION WITH CORONARY/GRAFT ANGIOGRAM: SHX5450

## 2013-12-29 LAB — CBC
HCT: 36.5 % — ABNORMAL LOW (ref 39.0–52.0)
Hemoglobin: 12.6 g/dL — ABNORMAL LOW (ref 13.0–17.0)
MCH: 33.8 pg (ref 26.0–34.0)
MCHC: 34.5 g/dL (ref 30.0–36.0)
MCV: 97.9 fL (ref 78.0–100.0)
PLATELETS: 167 10*3/uL (ref 150–400)
RBC: 3.73 MIL/uL — ABNORMAL LOW (ref 4.22–5.81)
RDW: 13.4 % (ref 11.5–15.5)
WBC: 5.4 10*3/uL (ref 4.0–10.5)

## 2013-12-29 LAB — POCT ACTIVATED CLOTTING TIME
ACTIVATED CLOTTING TIME: 249 s
ACTIVATED CLOTTING TIME: 171 s
Activated Clotting Time: 193 seconds

## 2013-12-29 LAB — HEPARIN LEVEL (UNFRACTIONATED): HEPARIN UNFRACTIONATED: 0.29 [IU]/mL — AB (ref 0.30–0.70)

## 2013-12-29 LAB — MRSA PCR SCREENING: MRSA by PCR: NEGATIVE

## 2013-12-29 LAB — PLATELET INHIBITION P2Y12: PLATELET FUNCTION P2Y12: 286 [PRU] (ref 194–418)

## 2013-12-29 SURGERY — LEFT HEART CATHETERIZATION WITH CORONARY/GRAFT ANGIOGRAM
Anesthesia: LOCAL

## 2013-12-29 MED ORDER — HEPARIN SODIUM (PORCINE) 1000 UNIT/ML IJ SOLN
INTRAMUSCULAR | Status: AC
  Filled 2013-12-29: qty 1

## 2013-12-29 MED ORDER — NITROGLYCERIN 0.2 MG/ML ON CALL CATH LAB
INTRAVENOUS | Status: AC
  Filled 2013-12-29: qty 1

## 2013-12-29 MED ORDER — SODIUM CHLORIDE 0.9 % IJ SOLN: 3.0000 mL | Freq: Two times a day (BID) | INTRAMUSCULAR | Status: DC

## 2013-12-29 MED ORDER — HEPARIN (PORCINE) IN NACL 2-0.9 UNIT/ML-% IJ SOLN
INTRAMUSCULAR | Status: AC
  Filled 2013-12-29: qty 1500

## 2013-12-29 MED ORDER — LIDOCAINE HCL (PF) 1 % IJ SOLN
INTRAMUSCULAR | Status: AC
  Filled 2013-12-29: qty 30

## 2013-12-29 MED ORDER — MIDAZOLAM HCL 2 MG/2ML IJ SOLN
INTRAMUSCULAR | Status: AC
  Filled 2013-12-29: qty 2

## 2013-12-29 MED ORDER — SODIUM CHLORIDE 0.9 % IV SOLN: 250.0000 mL | INTRAVENOUS | Status: DC | PRN

## 2013-12-29 MED ORDER — FENTANYL CITRATE 0.05 MG/ML IJ SOLN
INTRAMUSCULAR | Status: AC
  Filled 2013-12-29: qty 2

## 2013-12-29 MED ORDER — SODIUM CHLORIDE 0.9 % IV SOLN: 1.0000 mL/kg/h | INTRAVENOUS | Status: AC

## 2013-12-29 MED ORDER — FUROSEMIDE 10 MG/ML IJ SOLN
INTRAMUSCULAR | Status: AC
  Filled 2013-12-29: qty 4

## 2013-12-29 MED ORDER — FUROSEMIDE 10 MG/ML IJ SOLN
40.0000 mg | Freq: Two times a day (BID) | INTRAMUSCULAR | Status: DC
  Filled 2013-12-29 (×3): qty 4

## 2013-12-29 MED ORDER — SODIUM CHLORIDE 0.9 % IJ SOLN: 3.0000 mL | INTRAMUSCULAR | Status: DC | PRN

## 2013-12-29 NOTE — Progress Notes (Addendum)
ANTICOAGULATION CONSULT NOTE Pharmacy Consult for Heparin Indication: chest pain/ACS  No Known Allergies  Patient Measurements: Height: 5\' 3"  (160 cm) Weight: 220 lb 0.3 oz (99.8 kg) IBW/kg (Calculated) : 56.9 Heparin Dosing Weight:   Vital Signs: Temp: 98.2 F (36.8 C) (03/22 2039) Temp src: Oral (03/22 2039) BP: 102/80 mmHg (03/22 2039) Pulse Rate: 52 (03/22 2039)  Labs:  Recent Labs  12/28/13 0017 12/28/13 0038 12/28/13 0530 12/28/13 1200 12/28/13 1620 12/28/13 1920 12/29/13 0400  HGB 13.8 14.6 13.0  --   --   --  12.6*  HCT 40.0 43.0 38.0*  --   --   --  36.5*  PLT 202  --  176  --   --   --  167  APTT 27  --   --   --   --   --   --   LABPROT 12.9  --  12.9  --   --   --   --   INR 0.99  --  0.99  --   --   --   --   HEPARINUNFRC  --   --   --  0.12*  --  0.24* 0.29*  CREATININE  --  0.90 0.82  --   --   --   --   TROPONINI  --   --  <0.30 <0.30 <0.30  --   --     Estimated Creatinine Clearance: 116.7 ml/min (by C-G formula based on Cr of 0.82).  Assessment: 48 y.o. male with chest pain for heparin   Goal of Therapy:  Heparin level 0.3-0.7 units/ml Monitor platelets by anticoagulation protocol: Yes   Plan:  Increase Heparin 1450 units/hr F/U after cath  Geannie Risen, PharmD, BCPS

## 2013-12-29 NOTE — Progress Notes (Signed)
Jim RN notified Katie PA of HR 49-55 and SBP 100-110, parameters given by PA  To hold coreg for HR<65.

## 2013-12-29 NOTE — Progress Notes (Signed)
Primary Cardiologist: Dr. Diona BrownerMcDowell  Patient Profile:  [name] [name] is a 48 y.o. male with a history of CAD s/p CABG x 3, hyperlipidemia, ischemic cardiomyopathy w/ EF 35-40%, with most recent NSTEMI in 07/2013 and most recent cath at that time showing patent LIMA to LAD, occluded SVG to RCA and high grade disease in SVG to dignonal/ramus/OM1 s/p DES placement, who presented [facility] for intermittent exertional chest pain with increased intensity and frequency over last 1.5 weeks. [facility].   Cardiac enzymes are negative x 3.   EKG + for Twave abnormalities, also seen in prior EKGs  Lipid Panel 12/28/13 Trig: 204 HDL: 25 LDL: 47  Plan is for a LHC to redefine his coronary anatomy.    Subjective: Currently CP free, but he did have a brief episode of left-sided chest pain this am. Described as pressure like, lasting ~1 minute and resolved spontaneously. Denied associated SOB, diaphoresis, n/v.   Objective: Vital signs in last 24 hours: Temp:  [97.4 F (36.3 C)-98.2 F (36.8 C)] 97.5 F (36.4 C) (03/23 0420) Pulse Rate:  [50-62] 50 (03/23 0654) Resp:  [18-19] 18 (03/23 0420) BP: (93-105)/(55-80) 104/60 mmHg (03/23 0654) SpO2:  [90 %-98 %] 90 % (03/23 0420) Weight:  [222 lb 3.2 oz (100.789 kg)] 222 lb 3.2 oz (100.789 kg) (03/23 0500) Last BM Date: 12/27/13  Intake/Output from previous day: 03/22 0701 - 03/23 0700 In: 1192.7 [P.O.:720; I.V.:472.7] Out: 1850 [Urine:1850] Intake/Output this shift:    Medications Current Facility-Administered Medications  Medication Dose Route Frequency Provider Last Rate Last Dose  . 0.9 %  sodium chloride infusion  250 mL Intravenous PRN Lewayne BuntingBrian S Crenshaw, MD      . 0.9 %  sodium chloride infusion  1 mL/kg/hr Intravenous Continuous Lewayne BuntingBrian S Crenshaw, MD 99.8 mL/hr at 12/29/13 0411 1 mL/kg/hr at 12/29/13 0411  . allopurinol (ZYLOPRIM) tablet 300 mg  300 mg Oral Daily PRN Haydee SalterYan Liu, MD      . aspirin EC tablet  81 mg  81 mg Oral Daily Haydee SalterYan Liu, MD   81 mg at 12/28/13 1058  . carvedilol (COREG) tablet 12.5 mg  12.5 mg Oral BID WC Haydee SalterYan Liu, MD   12.5 mg at 12/28/13 1752  . clopidogrel (PLAVIX) tablet 75 mg  75 mg Oral Q breakfast Haydee SalterYan Liu, MD   75 mg at 12/29/13 0656  . heparin ADULT infusion 100 units/mL (25000 units/250 mL)  1,450 Units/hr Intravenous Continuous Rollene RotundaJames Hochrein, MD 14.5 mL/hr at 12/29/13 0452 1,450 Units/hr at 12/29/13 0452  . HYDROcodone-acetaminophen (NORCO/VICODIN) 5-325 MG per tablet 1 tablet  1 tablet Oral Q6H PRN Laurann Montanaayna N Dunn, [name] [name]   1 tablet at 12/28/13 2241  . ipratropium-albuterol (DUONEB) 0.5-2.5 (3) MG/3ML nebulizer solution 3 mL  3 mL Nebulization Q4H PRN Rollene RotundaJames Hochrein, MD      . isosorbide mononitrate (IMDUR) 24 hr tablet 60 mg  60 mg Oral BID WC Haydee SalterYan Liu, MD   60 mg at 12/28/13 1752  . lisinopril (PRINIVIL,ZESTRIL) tablet 2.5 mg  2.5 mg Oral BID Haydee SalterYan Liu, MD   2.5 mg at 12/28/13 2241  . nitroGLYCERIN (NITROSTAT) SL tablet 0.4 mg  0.4 mg Sublingual Q5 Min x 3 PRN Haydee SalterYan Liu, MD      . pantoprazole (PROTONIX) EC tablet 40 mg  40 mg Oral Daily Haydee SalterYan Liu, MD   40 mg at 12/28/13 1057  . ranolazine (RANEXA) 12 hr tablet 500 mg  500 mg Oral  BID Haydee Salter, MD   500 mg at 12/28/13 2241  . simvastatin (ZOCOR) tablet 20 mg  20 mg Oral q1800 Haydee Salter, MD   20 mg at 12/28/13 1753  . sodium chloride 0.9 % injection 3 mL  3 mL Intravenous Q12H Lewayne Bunting, MD   3 mL at 12/28/13 2242  . sodium chloride 0.9 % injection 3 mL  3 mL Intravenous PRN Lewayne Bunting, MD        PE: General appearance: alert, cooperative and no distress Lungs: clear to auscultation bilaterally Heart: regular rate and rhythm, S1, S2 normal, no murmur, click, rub or gallop Extremities: no LEE Pulses: 2+ and symmetric Skin: warm and dry Neurologic: Grossly normal  Lab Results:   Recent Labs  12/28/13 0017 12/28/13 0038 12/28/13 0530 12/29/13 0400  WBC 5.6  --  5.7 5.4  HGB 13.8 14.6 13.0 12.6*  HCT 40.0 43.0  38.0* 36.5*  PLT 202  --  176 167   BMET  Recent Labs  12/28/13 0038 12/28/13 0530  NA 142 138  K 3.8 4.0  CL 103 101  CO2  --  24  GLUCOSE 98 113*  BUN 9 12  CREATININE 0.90 0.82  CALCIUM  --  9.0   PT/INR  Recent Labs  12/28/13 0017 12/28/13 0530  LABPROT 12.9 12.9  INR 0.99 0.99   Cholesterol  Recent Labs  12/28/13 0530  CHOL 113   Cardiac Panel (last 3 results)  Recent Labs  12/28/13 0530 12/28/13 1200 12/28/13 1620  TROPONINI <0.30 <0.30 <0.30     Assessment/Plan  Active Problems:   Unstable angina   Angina pectoris  Plan: 48 y.o. male with a history of CAD s/p CABG x 3, hyperlipidemia, ischemic cardiomyopathy EF 35-40%, with most recent NSTEMI in 07/2013 and most recent cath at that time showing patent LIMA to LAD, occluded SVG to RCA and high grade disease in SVG to dignonal/ramus/OM1 s/p DES placement, who has been admitted for evaluation for unstable angina.   1. Unstable Angina/ CAD: Enzymes negative x 3.  Plan for [facility] +/- PCI today (renal function and INR both normal). Continue IV heparin until cath.  He has been on Plavix. Will check a P2Y12 to check level of platelet inhibition. If not sufficient, he will need to be switched to either Brilinta or Effient. Resume ASA, BB, ACE, statin, Nitrate and Ranexa.   2. Ischemic Cardiomyopathy: Last EF was 35-40%. No signs of acute HF. Will reassess EF at time of cath.  Continue ACE and BB.   3. DLD: LDL is well under goal of <16 at 47. Continue statin. His Triglycerides are elevated at 204. ? Adding another agent. HDL also low at 25. Will need to stress lifestyle modification with better diet,  increasing physical activity, and smoking cessation.   4. Chronic Tobacco Use: Pt seems motivated to quit. He has been on Wellbutrin in the past for depression and stated that is also curbed his smoking craving. ? Restarting now vs referral back to PCP for Rx.  Dispo: Home once stable from a cardiac  standpoint.  MD to follow with further recommendations.   LOS: 1 day    [name] [name] 12/29/2013 7:58 AM   I have personally seen and examined this patient with [name] [name], [name] [name]. I agree with the assessment and plan as outlined above. He is known to have severe native vessel CAD with occlusion of all native vessels. The RCA graft is known  to be occluded and the RCA fills from left to right collaterals. The SVG to the OM/Ramus/Diagonal required stenting 2014. He has a patent LIMA to LAD. Cardiac markers negative but given history and presenting symptoms, plan cardiac cath later today. Labs reviewed. All questions answered regarding cath.   [name] [name] 12/29/2013 10:28 AM

## 2013-12-29 NOTE — CV Procedure (Signed)
CARDIAC CATHETERIZATION AND PERCUTANEOUS CORONARY INTERVENTION REPORT  NAME:  [name] [name]   MRN: [id number] DOB:  04-Jul-1966   ADMIT DATE: 12/28/2013 Procedure Date: 12/29/2013  INTERVENTIONAL CARDIOLOGIST: Leonie Man, M.D., MS PRIMARY CARE PROVIDER: [name], MD PRIMARY CARDIOLOGIST:  [name], MD ([place])  PATIENT:  [name] [name] is a 48 y.o. male with h/o CAD s/p CABG x 3 (08/2002), hyperlipidemia, chronic smoker, ischemic cardiomyopathy w/ EF 35-40%, with most recent NSTEMI in 07/2013 and most recent cath at that time showing patent LIMA to LAD, occluded SVG to RCA and high grade disease in SVG to dignonal/ramus/OM1 s/p DES placement (3.0 mm x 16 mm), who [facility] for intermittent exertional chest pain with increased intensity and frequency over last 1.5 weeks. Transferred to [facility] for further care. Referred for LHC/Angio +/- PCI for Unstable Angina.   PRE-OPERATIVE DIAGNOSIS:    Unstable Angina  PROCEDURES PERFORMED:    LEFT HEART CATHETERIZATION WITH NATIVE CORONARY AND GRAFT ANGIOGRAPHY  PERCUTANEOUS TRANSLUMINAL ANGIOPLASTY OF 95% IN-STENT RESTENOSIS OF OSTIAL-PROXIMAL SVG-RAMUS-OM  PROCEDURE:Consent:  Risks of procedure as well as the alternatives and risks of each were explained to the (patient/caregiver).  Consent for procedure obtained. Consent for signed by MD and patient with RN witness -- placed on chart.   PROCEDURE: The patient was brought to the 2nd [facility] in the fasting state and prepped and draped in the usual sterile fashion for Right groin.  Sterile technique was used including antiseptics, cap, gloves, gown, hand hygiene, mask and sheet.  Skin prep: Chlorhexidine.  Time Out: Verified patient identification, verified procedure, site/side was marked, verified correct patient position, special equipment/implants available, medications/allergies/relevent history reviewed,  required imaging and test results available.  Performed  Access: Right Common Femoral Artery; 5 Fr Sheath -- fluorscopically guided modified Seldinger technique  Diagnostic Left Heart Catheterization with Coronary & Graft Angiography:  5 Fr JL4, JR 4, & LCB catheters advanced and exchanged over standard length J-wire into the ascending aorta and used for selective coronary artery engagement.  Left Coronary Artery Angiography: JL4  Right Coronary Artery, SVG-RI-OM & LIMA-LAD (wire surreptitiously advanced into Left Subclavian artery) Angiography: JR4  LV Hemodynamics (LV Gram): JR 4   Sheath:  Sutured in place, to be removed in post-procedure unit with manual pressure hemostasis.  Hemodynamics:  Central Aortic / Mean Pressures: 137/81 mmHg; 104 mmHg  Left Ventricular Pressures / EDP: 134/14 mmHg; 36 mmHg  Left Ventriculography:  Not performed due to elevated LVEDP  Coronary Anatomy:  Left Main: long, moderate to large caliber; diffusely diseased; trifurcates to occluded LAD & ostially diseased Ramus Intermedius & Circumflex. LAD: 100% ostial occlusion;; fills via Patent LIMA that retrograde fills a proximal D1; antegrade flow shows a diffusely diseased distal LAD that wraps the apex.  Septal perforator collaterals fill the entire RPDA.   LIMA-LAD: Widely patent graft, tortuous; angiographically normal.  Left Circumflex: diffuse disease with ostial/proximal ~80%. Distally, small vessel with no notable OM branches.  OM1 - ~95% early mid stenosis.  Ramus intermedius: Ostial subtotal occlusion.   SVG-RI (~D1)-OM1: Ostial stent with mid stent with ~95% focal stenosis   RCA: Large caliber vessel that is abruptly occluded in the early mid-segment.    SVG-RPDA: Known to be occluded.  RPDA: fills via L-R collaterals from sepal perforators; faint retrograde filling of the Right Posterolateral system.  After reviewing the initial angiography, the culprit lesion was thought to be the  95%  ISR in the ostial-proximal SVG-RI-OM.  Preparation were made to proceed with PCI on this lesion.  Percutaneous Coronary Intervention:  Sheath exchanged for 6 Fr Guide: 6 Fr  JR4 Guide Guidewire: BMW Predilation Balloon: Flextome  2.5  mm x 15 mm;   10 Atm x 30 Sec Post-dilation Balloon: Flextome 3.0 mm x 10 mm;   10 Atm x 45 Sec - distal stent  14 Atm x 45 Sec - ostial stent  Final Diameter = ~3.1 mm  Post deployment angiography in multiple views, with and without guidewire in place revealed excellent stent expansion with minimal  (<10%) residual stenosis.  There was no evidence of dissection or perforation.  MEDICATIONS:  Anesthesia:  Local Lidocaine 16 ml  Sedation:  2 mg IV Versed, 100 mcg IV fentanyl ;   Omnipaque Contrast: 115 ml  Anticoagulation:  IV Heparin 6500, 3000, (ACT 240 Sec) 1500 Units - (11000 total) final ACT >250 Sec  Anti-Platelet Agent:  Plavix 600 mg   PATIENT DISPOSITION:    The patient was transferred to the PACU holding area in a hemodynamicaly stable, chest pain free condition.  The patient tolerated the procedure well, and there were no complications.  EBL:   < 10 ml  The patient was stable before, during, and after the procedure.  POST-OPERATIVE DIAGNOSIS:    Severe native Vessel CAD (no notable change)   Severe Graft Disease with Occluded SVG-RCA & proximal SVG-RI-OM DES 95% ISR --> s/p Cutting Balloon PTCA with 3.0 mm Flextome balloon -- <10 % residual stenosis,   Severely Elevated LVP/EDP:   PLAN OF CARE:  Transfer to [facility] post -procedure suite for standard post catheterization care.  IV Lasix today for elevated LVEDP.  Continue to adjust CAD/ CHF medications.  DAPT for a minimum of 1 month, preferred x 1 yr.  Anticipate discharge in AM if stable  Smoking cessation counseling.   Leonie Man, M.D., M.S. [facility] [address] [place], [place]   [address]  [phone]  12/29/2013 3:59 PM

## 2013-12-29 NOTE — Brief Op Note (Signed)
  BRIEF CARDIAC CATH - PCI NOTE  12/28/2013 - 12/29/2013  4:04 PM  PATIENT:  [name] [name]  48 y.o. male with h/o CAD s/p CABG x 3 (08/2002), hyperlipidemia, chronic smoker, ischemic cardiomyopathy w/ EF 35-40%, with most recent NSTEMI in 07/2013 and most recent cath at that time showing patent LIMA to LAD, occluded SVG to RCA and high grade disease in SVG to dignonal/ramus/OM1 s/p DES placement (3.0 mm x 16 mm), who presented [facility] for intermittent exertional chest pain with increased intensity and frequency over last 1.5 weeks. Transferred to [facility] for further care. Referred for LHC/Angio +/- PCI for Unstable Angina.    PRE-OPERATIVE DIAGNOSIS:  Unstable Angina  POST-OPERATIVE DIAGNOSIS:    Severe native Vessel CAD (no notable change)  Severe Graft Disease with Occluded SVG-RCA & proximal SVG-RI-OM DES 95% ISR --> s/p Cutting Balloon PTCA with 3.0 mm Flextome balloon -- <10 % residual stenosis, diffuse irregularities in the distal graft after 1st anastomosis.  Severely Elevated LVP/EDP: 136/14 mHg / 36 mmHg; Ao Pressures: 137/81 mmHg / 104 mmHg  PROCEDURE:  Procedure(s): LEFT HEART CATHETERIZATION WITH CORONARY/GRAFT ANGIOGRAM (N/A) CUTTING BALLOON PTCA OF Ostial SVG-RI-OM DES 95% In-Stent Restenosis --> 3.0 mm Flextome balloon  SURGEON:  Surgeon(s) and Role:    * Leonie Man, MD - Primary  ANESTHESIA:   local and IV sedation  EBL:   < 10 ml  LHC-ANGIO: RFA 5Fr Sheath (fluoro guided) --> JL4 - LCA Angio, JR 4 - RCA, SVG-RCA & SVG-RI-OM, LIMA-LAD Angio; RCB - SVG-RCA Angio   PTCA:  Exchange for 6 Fr Sheath --> 6 Fr JR 4 Guide - BMW -- 2.5 mm x 15 mm Flextome (10 Atm x 35 Sec) --> 3.0 mm x 10 mm (10 Atm x 45 Sec - distal stent, 12 Atm x 45 Sec - ostial stent) --> < 10% Residual   LOCAL MEDICATIONS USED:  LIDOCAINE   6Fr RFA Sheath:  Sutured in Place - remove with Manual Pressure for Hemostasis.  DICTATION: .Note written in [place]: Admit to inpatient    PATIENT DISPOSITION:  PACU - hemodynamically stable.  --> 6C01   Delay start of Pharmacological VTE agent (>24hrs) due to surgical blood loss or risk of bleeding: not applicable  Leonie Man, M.D., M.S. Interventional Cardiologist  [place] Pager # [phone] 12/29/2013

## 2013-12-29 NOTE — Progress Notes (Signed)
Site area: right groin  Site Prior to Removal:  Level 0  Pressure Applied For 20 MINUTES    Minutes Beginning at 1920  Manual:   yes  Patient Status During Pull:  stable  Post Pull Groin Site:  Level 0  Post Pull Instructions Given:  yes  Post Pull Pulses Present:  yes  Dressing Applied:  yes  Comments:

## 2013-12-29 NOTE — Interval H&P Note (Signed)
History and Physical Interval Note:  12/29/2013 2:50 PM  [name] [name]  has presented today for surgery, with the diagnosis of UNSTABLE ANGINA.  The various methods of treatment have been discussed with the patient and family. After consideration of risks, benefits and other options for treatment, the patient has consented to  Procedure(s): LEFT HEART CATHETERIZATION WITH CORONARY/GRAFT ANGIOGRAM (N/A) +/- PCI\ as a surgical intervention .    The patient's history has been reviewed, patient examined, no change in status, stable for surgery.  I have reviewed the patient's chart and labs.  Questions were answered to the patient's satisfaction.     HARDING,DAVID W  Cath Lab Visit (complete for each Cath Lab visit)  Clinical Evaluation Leading to the Procedure:   ACS: yes  Non-ACS:    Anginal Classification: CCS III  Anti-ischemic medical therapy: Maximal Therapy (2 or more classes of medications)  Non-Invasive Test Results: No non-invasive testing performed  Prior CABG: Previous CABG

## 2013-12-29 NOTE — H&P (View-Only) (Signed)
Primary Cardiologist: Dr. Diona BrownerMcDowell  Patient Profile:  [name] ArenasSteven D Bergquist is a 48 y.o. male with a history of CAD s/p CABG x 3, hyperlipidemia, ischemic cardiomyopathy w/ EF 35-40%, with most recent NSTEMI in 07/2013 and most recent cath at that time showing patent LIMA to LAD, occluded SVG to RCA and high grade disease in SVG to dignonal/ramus/OM1 s/p DES placement, who [facility] for intermittent exertional chest pain with increased intensity and frequency over last 1.5 weeks. [facility].   Cardiac enzymes are negative x 3.   EKG + for Twave abnormalities, also seen in prior EKGs  Lipid Panel 12/28/13 Trig: 204 HDL: 25 LDL: 47  Plan is for a LHC to redefine his coronary anatomy.    Subjective: Currently CP free, but he did have a brief episode of left-sided chest pain this am. Described as pressure like, lasting ~1 minute and resolved spontaneously. Denied associated SOB, diaphoresis, n/v.   Objective: Vital signs in last 24 hours: Temp:  [97.4 F (36.3 C)-98.2 F (36.8 C)] 97.5 F (36.4 C) (03/23 0420) Pulse Rate:  [50-62] 50 (03/23 0654) Resp:  [18-19] 18 (03/23 0420) BP: (93-105)/(55-80) 104/60 mmHg (03/23 0654) SpO2:  [90 %-98 %] 90 % (03/23 0420) Weight:  [222 lb 3.2 oz (100.789 kg)] 222 lb 3.2 oz (100.789 kg) (03/23 0500) Last BM Date: 12/27/13  Intake/Output from previous day: 03/22 0701 - 03/23 0700 In: 1192.7 [P.O.:720; I.V.:472.7] Out: 1850 [Urine:1850] Intake/Output this shift:    Medications Current Facility-Administered Medications  Medication Dose Route Frequency Provider Last Rate Last Dose  . 0.9 %  sodium chloride infusion  250 mL Intravenous PRN Lewayne BuntingBrian S Crenshaw, MD      . 0.9 %  sodium chloride infusion  1 mL/kg/hr Intravenous Continuous Lewayne BuntingBrian S Crenshaw, MD 99.8 mL/hr at 12/29/13 0411 1 mL/kg/hr at 12/29/13 0411  . allopurinol (ZYLOPRIM) tablet 300 mg  300 mg Oral Daily PRN Haydee SalterYan Liu, MD      . aspirin EC tablet  81 mg  81 mg Oral Daily Haydee SalterYan Liu, MD   81 mg at 12/28/13 1058  . carvedilol (COREG) tablet 12.5 mg  12.5 mg Oral BID WC Haydee SalterYan Liu, MD   12.5 mg at 12/28/13 1752  . clopidogrel (PLAVIX) tablet 75 mg  75 mg Oral Q breakfast Haydee SalterYan Liu, MD   75 mg at 12/29/13 0656  . heparin ADULT infusion 100 units/mL (25000 units/250 mL)  1,450 Units/hr Intravenous Continuous Rollene RotundaJames Hochrein, MD 14.5 mL/hr at 12/29/13 0452 1,450 Units/hr at 12/29/13 0452  . HYDROcodone-acetaminophen (NORCO/VICODIN) 5-325 MG per tablet 1 tablet  1 tablet Oral Q6H PRN Laurann Montanaayna N Dunn, PA-C   1 tablet at 12/28/13 2241  . ipratropium-albuterol (DUONEB) 0.5-2.5 (3) MG/3ML nebulizer solution 3 mL  3 mL Nebulization Q4H PRN Rollene RotundaJames Hochrein, MD      . isosorbide mononitrate (IMDUR) 24 hr tablet 60 mg  60 mg Oral BID WC Haydee SalterYan Liu, MD   60 mg at 12/28/13 1752  . lisinopril (PRINIVIL,ZESTRIL) tablet 2.5 mg  2.5 mg Oral BID Haydee SalterYan Liu, MD   2.5 mg at 12/28/13 2241  . nitroGLYCERIN (NITROSTAT) SL tablet 0.4 mg  0.4 mg Sublingual Q5 Min x 3 PRN Haydee SalterYan Liu, MD      . pantoprazole (PROTONIX) EC tablet 40 mg  40 mg Oral Daily Haydee SalterYan Liu, MD   40 mg at 12/28/13 1057  . ranolazine (RANEXA) 12 hr tablet 500 mg  500 mg Oral  BID Haydee Salter, MD   500 mg at 12/28/13 2241  . simvastatin (ZOCOR) tablet 20 mg  20 mg Oral q1800 Haydee Salter, MD   20 mg at 12/28/13 1753  . sodium chloride 0.9 % injection 3 mL  3 mL Intravenous Q12H Lewayne Bunting, MD   3 mL at 12/28/13 2242  . sodium chloride 0.9 % injection 3 mL  3 mL Intravenous PRN Lewayne Bunting, MD        PE: General appearance: alert, cooperative and no distress Lungs: clear to auscultation bilaterally Heart: regular rate and rhythm, S1, S2 normal, no murmur, click, rub or gallop Extremities: no LEE Pulses: 2+ and symmetric Skin: warm and dry Neurologic: Grossly normal  Lab Results:   Recent Labs  12/28/13 0017 12/28/13 0038 12/28/13 0530 12/29/13 0400  WBC 5.6  --  5.7 5.4  HGB 13.8 14.6 13.0 12.6*  HCT 40.0 43.0  38.0* 36.5*  PLT 202  --  176 167   BMET  Recent Labs  12/28/13 0038 12/28/13 0530  NA 142 138  K 3.8 4.0  CL 103 101  CO2  --  24  GLUCOSE 98 113*  BUN 9 12  CREATININE 0.90 0.82  CALCIUM  --  9.0   PT/INR  Recent Labs  12/28/13 0017 12/28/13 0530  LABPROT 12.9 12.9  INR 0.99 0.99   Cholesterol  Recent Labs  12/28/13 0530  CHOL 113   Cardiac Panel (last 3 results)  Recent Labs  12/28/13 0530 12/28/13 1200 12/28/13 1620  TROPONINI <0.30 <0.30 <0.30     Assessment/Plan  Active Problems:   Unstable angina   Angina pectoris  Plan: 48 y.o. male with a history of CAD s/p CABG x 3, hyperlipidemia, ischemic cardiomyopathy EF 35-40%, with most recent NSTEMI in 07/2013 and most recent cath at that time showing patent LIMA to LAD, occluded SVG to RCA and high grade disease in SVG to dignonal/ramus/OM1 s/p DES placement, who has been admitted for evaluation for unstable angina.   1. Unstable Angina/ CAD: Enzymes negative x 3.  Plan for [facility] +/- PCI today (renal function and INR both normal). Continue IV heparin until cath.  He has been on Plavix. Will check a P2Y12 to check level of platelet inhibition. If not sufficient, he will need to be switched to either Brilinta or Effient. Resume ASA, BB, ACE, statin, Nitrate and Ranexa.   2. Ischemic Cardiomyopathy: Last EF was 35-40%. No signs of acute HF. Will reassess EF at time of cath.  Continue ACE and BB.   3. DLD: LDL is well under goal of <16 at 47. Continue statin. His Triglycerides are elevated at 204. ? Adding another agent. HDL also low at 25. Will need to stress lifestyle modification with better diet,  increasing physical activity, and smoking cessation.   4. Chronic Tobacco Use: Pt seems motivated to quit. He has been on Wellbutrin in the past for depression and stated that is also curbed his smoking craving. ? Restarting now vs referral back to PCP for Rx.  Dispo: Home once stable from a cardiac  standpoint.  MD to follow with further recommendations.   LOS: 1 day    Brittainy M. Delmer Islam 12/29/2013 7:58 AM   I have personally seen and examined this patient with Robbie Lis, PA-C. I agree with the assessment and plan as outlined above. He is known to have severe native vessel CAD with occlusion of all native vessels. The RCA graft is known  to be occluded and the RCA fills from left to right collaterals. The SVG to the OM/Ramus/Diagonal required stenting 2014. He has a patent LIMA to LAD. Cardiac markers negative but given history and presenting symptoms, plan cardiac cath later today. Labs reviewed. All questions answered regarding cath.   MCALHANY,CHRISTOPHER 12/29/2013 10:28 AM

## 2013-12-30 DIAGNOSIS — I517 Cardiomegaly: Secondary | ICD-10-CM

## 2013-12-30 LAB — BASIC METABOLIC PANEL
BUN: 8 mg/dL (ref 6–23)
CO2: 30 mEq/L (ref 19–32)
Calcium: 9 mg/dL (ref 8.4–10.5)
Chloride: 100 mEq/L (ref 96–112)
Creatinine, Ser: 0.93 mg/dL (ref 0.50–1.35)
GFR calc Af Amer: >90 mL/min (ref >90)
Glucose, Bld: 122 mg/dL — ABNORMAL HIGH (ref 70–99)
POTASSIUM: 3.8 meq/L (ref 3.7–5.3)
SODIUM: 142 meq/L (ref 137–147)

## 2013-12-30 LAB — CBC
HCT: 38.8 % — ABNORMAL LOW (ref 39.0–52.0)
Hemoglobin: 13.4 g/dL (ref 13.0–17.0)
MCH: 33.9 pg (ref 26.0–34.0)
MCHC: 34.5 g/dL (ref 30.0–36.0)
MCV: 98.2 fL (ref 78.0–100.0)
Platelets: 192 10*3/uL (ref 150–400)
RBC: 3.95 MIL/uL — ABNORMAL LOW (ref 4.22–5.81)
RDW: 13.5 % (ref 11.5–15.5)
WBC: 5 10*3/uL (ref 4.0–10.5)

## 2013-12-30 LAB — HEMOGLOBIN A1C
Hgb A1c MFr Bld: 5.8 % — ABNORMAL HIGH (ref <5.7)
Mean Plasma Glucose: 120 mg/dL — ABNORMAL HIGH (ref <117)

## 2013-12-30 LAB — HEPATIC FUNCTION PANEL
ALT: 26 U/L (ref 0–53)
AST: 28 U/L (ref 0–37)
Albumin: 3.6 g/dL (ref 3.5–5.2)
Alkaline Phosphatase: 39 U/L (ref 39–117)
BILIRUBIN TOTAL: 0.3 mg/dL (ref 0.3–1.2)
Total Protein: 7 g/dL (ref 6.0–8.3)

## 2013-12-30 MED ORDER — FUROSEMIDE 40 MG PO TABS
40.0000 mg | ORAL_TABLET | Freq: Two times a day (BID) | ORAL | Status: DC
  Filled 2013-12-30 (×3): qty 1

## 2013-12-30 MED ORDER — CARVEDILOL 6.25 MG PO TABS
6.2500 mg | ORAL_TABLET | Freq: Two times a day (BID) | ORAL | Status: DC
  Filled 2013-12-30 (×2): qty 1

## 2013-12-30 MED ORDER — FUROSEMIDE 20 MG PO TABS: 20.0000 mg | ORAL_TABLET | Freq: Every day | ORAL | Status: DC

## 2013-12-30 MED ORDER — FUROSEMIDE 20 MG PO TABS
20.0000 mg | ORAL_TABLET | Freq: Every day | ORAL | Status: DC
  Administered 2013-12-30: 10:00:00 20 mg via ORAL
  Filled 2013-12-30: qty 1

## 2013-12-30 MED ORDER — CARVEDILOL 6.25 MG PO TABS: 6.2500 mg | ORAL_TABLET | Freq: Two times a day (BID) | ORAL | Status: DC

## 2013-12-30 NOTE — Progress Notes (Signed)
Patient Name: [name] [name] Date of Encounter: 12/30/2013  Principal Problem:   Unstable angina Active Problems:   HYPERLIPIDEMIA-MIXED   TOBACCO USER   CAD, NATIVE VESSEL   Angina pectoris   S/P percutaneous transluminal coronary angioplasty    SUBJECTIVE: Feels better, no chest pain, no SOB. Feels ready for d/c. Has chronic problems with slow urine stream, only able to void a small amount at a time, no dysuria.  OBJECTIVE Filed Vitals:   12/29/13 2300 12/30/13 0020 12/30/13 0456 12/30/13 0547  BP: 154/79 145/79 99/55 96/61   Pulse: 47 54 51   Temp: 97.5 F (36.4 C)  98 F (36.7 C)   TempSrc: Oral  Oral   Resp: 17  17   Height:      Weight: 220 lb 7.4 oz (100 kg)     SpO2: 98% 97% 95%     Intake/Output Summary (Last 24 hours) at 12/30/13 16100626 Last data filed at 12/30/13 0019  Gross per 24 hour  Intake 1110.93 ml  Output   2700 ml  Net -1589.07 ml   Filed Weights   12/28/13 0500 12/29/13 0500 12/29/13 2300  Weight: 220 lb 0.3 oz (99.8 kg) 222 lb 3.2 oz (100.789 kg) 220 lb 7.4 oz (100 kg)    PHYSICAL EXAM General: Well developed, well nourished, male in no acute distress. Head: Normocephalic, atraumatic.  Neck: Supple without bruits, JVD not elevated Lungs:  Resp regular and unlabored, few dry rales, rare end-exp wheeze. Heart: RRR, S1, S2, no S3, S4, or murmur; no rub. Abdomen: Soft, non-tender, non-distended, BS + x 4.  Extremities: No clubbing, cyanosis, no edema. Cath site right groin without hematoma/bruit Neuro: Alert and oriented X 3. Moves all extremities spontaneously. Psych: Normal affect.  LABS: CBC: Recent Labs  12/28/13 0017  12/29/13 0400 12/30/13 0445  WBC 5.6  < > 5.4 5.0  NEUTROABS 3.1  --   --   --   HGB 13.8  < > 12.6* 13.4  HCT 40.0  < > 36.5* 38.8*  MCV 98.8  < > 97.9 98.2  PLT 202  < > 167 192  < > = values in this interval not displayed. INR:  Recent Labs  12/28/13 0530  INR 0.99   Basic Metabolic  Panel:  Recent Labs  12/28/13 0530 12/30/13 0445  NA 138 142  K 4.0 3.8  CL 101 100  CO2 24 30  GLUCOSE 113* 122*  BUN 12 8  CREATININE 0.82 0.93  CALCIUM 9.0 9.0   Liver Function Tests:   Lab Results  Component Value Date   ALT 41 04/12/2010   AST 39* 04/12/2010   ALKPHOS 40 04/12/2010   BILITOT 0.4 04/12/2010   Cardiac Enzymes:  Recent Labs  12/28/13 0530 12/28/13 1200 12/28/13 1620  TROPONINI <0.30 <0.30 <0.30    Recent Labs  12/28/13 0018  TROPIPOC 0.01   Fasting Lipid Panel:  Recent Labs  12/28/13 0530  CHOL 113  HDL 25*  LDLCALC 47  TRIG [id number]*  CHOLHDL 4.5   TELE:  Mostly Sinus brady, no significant ectopy  Current Medications:  . aspirin EC  81 mg Oral Daily  . carvedilol  12.5 mg Oral BID WC  . clopidogrel  75 mg Oral Q breakfast  . furosemide  40 mg Intravenous BID  . isosorbide mononitrate  60 mg Oral BID WC  . lisinopril  2.5 mg Oral BID  . pantoprazole  40 mg Oral Daily  . ranolazine  500 mg Oral BID  . simvastatin  20 mg Oral q1800    ASSESSMENT AND PLAN: Principal Problem:   Unstable angina - CUTTING BALLOON PTCA OF Ostial SVG-RI-OM DES 95% In-Stent Restenosis --> 3.0 mm Flextome balloon: Pt doing well post-PCI.   Active Problems:   HYPERLIPIDEMIA-MIXED - on simva 40, add on LFTs to am labs.     TOBACCO USER - cessation encouraged    CAD, NATIVE VESSEL/ Angina pectoris - see above    S/P percutaneous transluminal coronary angioplasty - see above    Hyperglycemia - fasting blood sugars have been elevated at times and triglycerides are elevated, check hemoglobin A1c, he is on carb-mod diet     Hypotension - hx HTN, BP low since IV Lasix given last p.m. Hold a.m. meds until M.D. sees. Patient is asymptomatic.     Problems with urination - likely prostate, ck UA, consider Flomax    Plan: Cardiac rehabilitation to see, DC when medically stable   Signed, Theodore Demark , PA-C 6:26 AM 12/30/2013  I have seen and evaluated the  patient this Am along withRhonda Barrett , PA-C. I agree with her findings, examination as well as impression recommendations.  Principal Problem:   Unstable angina Active Problems:   HYPERLIPIDEMIA-MIXED   TOBACCO USER   CAD, NATIVE VESSEL   Angina pectoris   S/P percutaneous transluminal coronary angioplasty  Did relatively well o/n - good diuresis, but BP dropped notably after IV Lasix.  Now stable in 90s & BP meds held. Groin c/d/i.  Can convert Lasix to PO ~20 mg daily. With bradycardia - reduce BB to 6.25 mg bid - if BP were to increase, would titrate up ACE-I for better afterload reduction.  Need Echo today - LV Gram not performed due to elevated LVEDP during Cath.  Ambulate with [facility] - ensure BP stable.    May still be ready for d/c this AM if BP stable -- ROV with Dr. Diona Browner.  Marykay Lex, M.D., M.S. Interventional Cardiologist  [facility] Pager # [phone] 12/30/2013

## 2013-12-30 NOTE — Plan of Care (Signed)
Problem: Consults Goal: Heart Failure Patient Education (See Patient Education module for education specifics.)  Outcome: Progressing Pt watched heart failure education video.

## 2013-12-30 NOTE — Care Management Note (Signed)
    Page 1 of 2   12/30/2013     10:58:20 AM   CARE MANAGEMENT NOTE 12/30/2013  Patient:  [name] [name], [name] [name]   Account Number:  [id number]  Date Initiated:  12/30/2013  Documentation initiated by:  [facility]  Subjective/Objective Assessment:   48 y.o. male with a HX of CAD s/p CABG x 3, hyperlipidemia, ischemic cardiomyopathy EF 35-40%, NSTEMI; in [facility] ED for intermittent exertional chest pain/Home with spouse     Action/Plan:   PROCEDURE:  Procedure(s):  LEFT HEART CATHETERIZATION WITH CORONARY/GRAFT ANGIOGRAM (N/A)  CUTTING BALLOON PTCA OF Ostial SVG-RI-OM DES 95% In-Stent Restenosis --> 3.0 mm Flextome balloon//Access for [facility]   Anticipated DC Date:  12/30/2013   Anticipated DC Plan:  HOME W HOME HEALTH SERVICES      DC Planning Services  CM consult      [facility] Choice  HOME HEALTH   Choice offered to / List presented to:  C-1 Patient        HH arranged  HH-1 RN  HH-10 DISEASE MANAGEMENT      HH agency  [facility]   Status of service:  Completed, signed off Medicare Important Message given?   (If response is "NO", the following Medicare IM given date fields will be blank) Date Medicare IM given:   Date Additional Medicare IM given:    Discharge Disposition:  HOME W HOME HEALTH SERVICES  Per UR Regulation:    If discussed at Long Length of Stay Meetings, dates discussed:    Comments:  12/30/13 1030 Oletta Cohn, RN, BSN, [employer] [phone] Spoke with pt.at bedside regarding discharge planning. CM offered pt. list of home health agencies. Pt. chose [facility] to render services. Kizzie Furnish of [facility] notified. No DME needs identified at this time.

## 2013-12-30 NOTE — Progress Notes (Signed)
CARDIAC REHAB PHASE I   PRE:  Rate/Rhythm: 63 SR  BP:  Supine:   Sitting: 114/73  Standing:    SaO2:   MODE:  Ambulation: 1000 ft   POST:  Rate/Rhythm: 64 SR  BP:  Supine:   Sitting: 122/74  Standing:    SaO2:  0755-0855 Pt walked 1000 ft with steady gait. No CP or SOB. Pt tolerated well. Pt has several strategies for quitting smoking. He stated that daughter near tears last night because he has not stopped smoking. He is setting April first as last day to smoke any. He is going to try to not smoke any but if he has difficulty, he is going to discuss wellbutrin with MD. Jovita Gamma smoking cessation handouts. Discussed CHF and importance of daily weights, fluid restriction, low sodium restriction. Pt drinks too much liquid and stated he knows he drinks too many sugar free caffeine free sodas. Discussed adhering to 2L fluid restriction. Pt knows that he needs to weigh daily and he can repeat when to call MD with weight gain. Reviewed NTG use. Had referred pt to [facility] in October but he states that is too far to drive and he would rather go to [facility] that is within 5 minutes.    Luetta Nutting, RN BSN  12/30/2013 8:45 AM

## 2013-12-30 NOTE — Progress Notes (Signed)
BP 96/61, pt denies complaints except chronic back pain.  Lasix held this am until MD makes rounds.  Did not receive coreg or lisinopril due to low BP.  Discussed principles of CHF. CHF teaching packet and given, medications and  S/S worsening heart failure reviewed.  Pt demonstrates no knowledge about CHF and needs much reinforcement, drinks coffee in large amounts all night long.  Discussed smoking cessation, tips for success sheet with support hotline number given.

## 2013-12-30 NOTE — Discharge Summary (Signed)
CARDIOLOGY DISCHARGE SUMMARY   Patient ID: [name] [name] MRN: [id number] DOB/AGE: August 23, 1966 48 y.o.  Admit date: 12/28/2013 Discharge date: 12/30/2013  PCP: Toma Deiters, MD Primary Cardiologist: SM  Primary Discharge Diagnosis:   Unstable angina:  s/p Cutting Balloon PTCA with 3.0 mm Flextome balloon -- <10 % residual stenosis, diffuse irregularities in the distal graft after 1st anastomosis.  Secondary Discharge Diagnosis:    HYPERLIPIDEMIA-MIXED   TOBACCO USER   CAD, NATIVE VESSEL   Angina pectoris   S/P percutaneous transluminal coronary angioplasty  Procedures:  Cardiac catheterization, coronary arteriogram, SVG angiogram, LIMA arteriogram, Cutting Balloon PTCA of SVG-RI-OM  Hospital Course: [name] [name] is a 48 y.o. male with a history of CAD. He went to [facility] for exertional chest pain and was transferred to [facility] for further evaluation and treatment.  His cardiac enzymes were negative for MI. His symptoms were consistent with unstable angina and he was taken to the cath lab on 3/23. Results are below. He had cutting balloon PTCA of a vein graft and tolerated the procedure well. His LVEDP was elevated and he was started on IV Lasix. After the IV Lasix, his systolic blood pressure dropped into the 90s and p.m. blood pressure medications were held.  A 2-D echocardiogram was performed, results are pending. His blood sugars were noted to be elevated so a hemoglobin A1c was drawn, results are normal. He is encouraged to limit carbohydrates and sweets in his diet. His lipid profile was performed and reviewed. Liver function tests were also performed and are within normal limits.  On 3/24 he was seen by Dr. Herbie Baltimore and by cardiac rehabilitation. All data were reviewed. He had diuresed well after the IV Lasix and his respiratory status was improved. He was noted to be bradycardic with a heart rate dropping into the 40s at times. With the elevation in his EDP, he will  be on low dose Lasix daily. If his blood pressure increases, the ACE inhibitor can be of-titrated for better afterload reduction. He is doing well and is considered stable for discharge, to followup as an outpatient in [place].  Labs:   Lab Results  Component Value Date   WBC 5.0 12/30/2013   HGB 13.4 12/30/2013   HCT 38.8* 12/30/2013   MCV 98.2 12/30/2013   PLT 192 12/30/2013     Recent Labs Lab 12/30/13 0445  NA 142  K 3.8  CL 100  CO2 30  BUN 8  CREATININE 0.93  CALCIUM 9.0  PROT 7.0  BILITOT 0.3  ALKPHOS 39  ALT 26  AST 28  GLUCOSE 122*    Recent Labs  12/28/13 0530 12/28/13 1200 12/28/13 1620  TROPONINI <0.30 <0.30 <0.30   Lipid Panel     Component Value Date/Time   CHOL 113 12/28/2013 0530   TRIG 204* 12/28/2013 0530   HDL 25* 12/28/2013 0530   CHOLHDL 4.5 12/28/2013 0530   VLDL 41* 12/28/2013 0530   LDLCALC 47 12/28/2013 0530    Recent Labs  12/28/13 0530  INR 0.99   Lab Results  Component Value Date   HGBA1C 5.8* 12/30/2013   Lab Results  Component Value Date   ALT 26 12/30/2013   AST 28 12/30/2013   ALKPHOS 39 12/30/2013   BILITOT 0.3 12/30/2013      Radiology: Dg Chest Port 1 View 12/28/2013   CLINICAL DATA:  One week history of intermittent chest pain and pressure. Prior CABG. Current smoker.  EXAM: PORTABLE CHEST -  1 VIEW  COMPARISON:  DG CHEST 1V PORT dated 07/13/2013; DG CHEST 1V PORT dated 02/05/2013; DG RIBS W/ CHEST 3+V*L* dated 06/05/2012; DG CHEST 2V dated 03/18/2012  FINDINGS: Prior sternotomy for CABG. Cardiac silhouette mildly enlarged but stable. Lungs clear. Bronchovascular markings normal. Pulmonary vascularity normal. No visible pleural effusions. No pneumothorax.  IMPRESSION: Stable mild cardiomegaly. No acute cardiopulmonary disease.   Electronically Signed   By: Hulan Saas M.D.   On: 12/28/2013 01:15    Cardiac Cath: 12/29/2013 POST-OPERATIVE DIAGNOSIS:  Severe native Vessel CAD (no notable change)  Severe Graft Disease with  Occluded SVG-RCA & proximal SVG-RI-OM DES 95% ISR --> s/p Cutting Balloon PTCA with 3.0 mm Flextome balloon -- <10 % residual stenosis, diffuse irregularities in the distal graft after 1st anastomosis.  Severely Elevated LVP/EDP: 136/14 mHg / 36 mmHg; Ao Pressures: 137/81 mmHg / 104 mmHg   FOLLOW UP PLANS AND APPOINTMENTS No Known Allergies   Medication List         albuterol 108 (90 BASE) MCG/ACT inhaler  Commonly known as:  PROVENTIL HFA;VENTOLIN HFA  Inhale 2 puffs into the lungs every 6 (six) hours as needed for wheezing or shortness of breath.     allopurinol 300 MG tablet  Commonly known as:  ZYLOPRIM  Take 300 mg by mouth daily as needed (for gout flare ups).     aspirin EC 81 MG tablet  Take 1 tablet (81 mg total) by mouth daily.     baclofen 10 MG tablet  Commonly known as:  LIORESAL  Take 10 mg by mouth See admin instructions. Three times daily as needed for spasms. Typically takes 2 times a day     carvedilol 6.25 MG tablet  Commonly known as:  COREG  Take 1 tablet (6.25 mg total) by mouth 2 (two) times daily with a meal.     clopidogrel 75 MG tablet  Commonly known as:  PLAVIX  Take 1 tablet (75 mg total) by mouth daily.     colchicine 0.6 MG tablet  Take 0.6 mg by mouth daily as needed (for gout flare ups).     diazepam 5 MG tablet  Commonly known as:  VALIUM  Take 5 mg by mouth 2 (two) times daily.     Fish Oil 1000 MG Caps  Take 1,000 mg by mouth 2 (two) times daily.     furosemide 20 MG tablet  Commonly known as:  LASIX  Take 1 tablet (20 mg total) by mouth daily.     HYDROcodone-acetaminophen 5-325 MG per tablet  Commonly known as:  NORCO/VICODIN  Take 1 tablet by mouth every 6 (six) hours as needed.     isosorbide mononitrate 60 MG 24 hr tablet  Commonly known as:  IMDUR  Take 1 tablet (60 mg total) by mouth 2 (two) times daily.     lisinopril 2.5 MG tablet  Commonly known as:  PRINIVIL,ZESTRIL  Take 1 tablet (2.5 mg total) by mouth 2 (two)  times daily.     nitroGLYCERIN 0.4 MG SL tablet  Commonly known as:  NITROSTAT  Place 1 tablet (0.4 mg total) under the tongue every 5 (five) minutes x 3 doses as needed for chest pain.     pantoprazole 40 MG tablet  Commonly known as:  PROTONIX  Take 40 mg by mouth daily. Obtain further refills from primary MD     pravastatin 80 MG tablet  Commonly known as:  PRAVACHOL  Take 1 tablet (80 mg total) by  mouth every evening.     ranolazine 500 MG 12 hr tablet  Commonly known as:  RANEXA  Take 1 tablet (500 mg total) by mouth 2 (two) times daily.        Discharge Orders   Future Appointments Provider Department Dept Phone   01/12/2014 2:40 PM Jonelle SidleSamuel G McDowell, MD [facility] [place] [phone]   Future Orders Complete By Expires   Diet - low sodium heart healthy  As directed    Increase activity slowly  As directed      Follow-up Information   Follow up with Nona DellSamuel McDowell, MD On 01/12/2014. (at 2:40 pm)    Specialty:  Cardiology   Contact information:   [address] [place] [id number] [phone]       Follow up with [facility]. Banker(RN)    SolicitorContact information:   [address] [place] [id number] [phone]       BRING ALL MEDICATIONS WITH YOU TO FOLLOW UP APPOINTMENTS  Time spent with patient to include physician time: 45 min Signed: Theodore Demarkhonda Lenise Jr, PA-C 12/30/2013, 3:25 PM Co-Sign MD

## 2013-12-30 NOTE — Discharge Summary (Signed)
Patient seen examined the day of discharge. Agree with discharge summary. Please see last couple details.  Marykay Lex, M.D., M.S. Interventional Cardiologist  [facility] Pager # [phone] 12/30/2013

## 2013-12-30 NOTE — Progress Notes (Signed)
  Echocardiogram 2D Echocardiogram has been performed.  Arvil Chaco 12/30/2013, 10:05 AM

## 2013-12-30 NOTE — Discharge Instructions (Signed)
NO TOBACCO  LIMIT CARBOHYDRATES AND SWEETS  PLEASE REMEMBER TO BRING ALL OF YOUR MEDICATIONS TO EACH OF YOUR FOLLOW-UP OFFICE VISITS.  PLEASE ATTEND ALL SCHEDULED FOLLOW-UP APPOINTMENTS.   Activity: Increase activity slowly as tolerated. You may shower, but no soaking baths (or swimming) for 1 week. No driving for 2 days. No lifting over 5 lbs for 1 week. No sexual activity for 1 week.   You May Return to Work: in 1 week (if applicable)  Wound Care: You may wash cath site gently with soap and water. Keep cath site clean and dry. If you notice pain, swelling, bleeding or pus at your cath site, please call [phone].    Cardiac Cath Site Care Refer to this sheet in the next few weeks. These instructions provide you with information on caring for yourself after your procedure. Your caregiver may also give you more specific instructions. Your treatment has been planned according to current medical practices, but problems sometimes occur. Call your caregiver if you have any problems or questions after your procedure. HOME CARE INSTRUCTIONS  You may shower 24 hours after the procedure. Remove the bandage (dressing) and gently wash the site with plain soap and water. Gently pat the site dry.   Do not apply powder or lotion to the site.   Do not sit in a bathtub, swimming pool, or whirlpool for 5 to 7 days.   No bending, squatting, or lifting anything over 10 pounds (4.5 kg) as directed by your caregiver.   Inspect the site at least twice daily.   Do not drive home if you are discharged the same day of the procedure. Have someone else drive you.   You may drive 24 hours after the procedure unless otherwise instructed by your caregiver.  What to expect:  Any bruising will usually fade within 1 to 2 weeks.   Blood that collects in the tissue (hematoma) may be painful to the touch. It should usually decrease in size and tenderness within 1 to 2 weeks.  SEEK IMMEDIATE MEDICAL CARE IF:  You  have unusual pain at the site or down the affected limb.   You have redness, warmth, swelling, or pain at the site.   You have drainage (other than a small amount of blood on the dressing).   You have chills.   You have a fever or persistent symptoms for more than 72 hours.   You have a fever and your symptoms suddenly get worse.   Your leg becomes pale, cool, tingly, or numb.   You have heavy bleeding from the site. Hold pressure on the site.  Document Released: 10/28/2010 Document Revised: 09/14/2011 Document Reviewed: 10/28/2010 [facility] Patient Information 2012 [place], [place].

## 2014-01-06 ENCOUNTER — Encounter: Payer: PRIVATE HEALTH INSURANCE | Admitting: Adult Health

## 2014-01-12 ENCOUNTER — Encounter: Payer: Self-pay | Admitting: Cardiology

## 2014-01-12 ENCOUNTER — Ambulatory Visit (INDEPENDENT_AMBULATORY_CARE_PROVIDER_SITE_OTHER): Payer: PRIVATE HEALTH INSURANCE | Admitting: Cardiology

## 2014-01-12 VITALS — BP 108/72 | HR 61 | Ht 63.5 in | Wt 218.0 lb

## 2014-01-12 DIAGNOSIS — F17200 Nicotine dependence, unspecified, uncomplicated: Secondary | ICD-10-CM

## 2014-01-12 DIAGNOSIS — I2589 Other forms of chronic ischemic heart disease: Secondary | ICD-10-CM

## 2014-01-12 DIAGNOSIS — I255 Ischemic cardiomyopathy: Secondary | ICD-10-CM

## 2014-01-12 DIAGNOSIS — I10 Essential (primary) hypertension: Secondary | ICD-10-CM

## 2014-01-12 DIAGNOSIS — I2510 Atherosclerotic heart disease of native coronary artery without angina pectoris: Secondary | ICD-10-CM

## 2014-01-12 DIAGNOSIS — E785 Hyperlipidemia, unspecified: Secondary | ICD-10-CM

## 2014-01-12 NOTE — Patient Instructions (Signed)
Continue all current medications. Lab for BMET - due just prior to next office visit Follow up in  6 weeks

## 2014-01-12 NOTE — Assessment & Plan Note (Signed)
LDL 47 recently, continues on Pravachol and omega-3 supplements.

## 2014-01-12 NOTE — Assessment & Plan Note (Signed)
Continue medical therapy, recent adjustments in the hospital noted. Blood pressure and heart rate look good today. Followup will be in 6 weeks, recheck BMET at that time. Reviewed the importance of continuing DAPT.

## 2014-01-12 NOTE — Progress Notes (Signed)
Clinical Summary [name] [name] is a 48 y.o.male just recently seen in the office back on March 10, clinically stable at that time. He subsequently presented with recurrent chest pain later in March, was admitted to [facility] where he ruled out for myocardial infarction. He did undergo cardiac catheterization which demonstrated severe native vessel CAD, occluded SVG to RCA, severely diseased SVG to ramus intermedius and obtuse marginal requiring DES in the proximal segment with cutting balloon angioplasty.  Followup echocardiogram done on March 24 reported mild LVH with LVEF 55-60%, difficult to assess focal wall motion, no major valvular abnormalities described. We discussed this today.  Recent lipid panel showed cholesterol 113, triglycerides 204, HDL 25, LDL 47.  He reports improved angina, still has NYHA class 2-3 dyspnea at baseline. He is trying to quit smoking, and we discussed smoking cessation strategies. He reports compliance with his medications otherwise. He has been trying to do some walking in the early evening.   No Known Allergies  Current Outpatient Prescriptions  Medication Sig Dispense Refill  . albuterol (PROVENTIL HFA;VENTOLIN HFA) 108 (90 BASE) MCG/ACT inhaler Inhale 2 puffs into the lungs every 6 (six) hours as needed for wheezing or shortness of breath.      . allopurinol (ZYLOPRIM) 300 MG tablet Take 300 mg by mouth daily as needed (for gout flare ups).      Marland Kitchen aspirin EC 81 MG tablet Take 1 tablet (81 mg total) by mouth daily.      . baclofen (LIORESAL) 10 MG tablet Take 10 mg by mouth See admin instructions. Three times daily as needed for spasms. Typically takes 2 times a day      . carvedilol (COREG) 6.25 MG tablet Take 1 tablet (6.25 mg total) by mouth 2 (two) times daily with a meal.  60 tablet  6  . clopidogrel (PLAVIX) 75 MG tablet Take 1 tablet (75 mg total) by mouth daily.  30 tablet  11  . colchicine 0.6 MG tablet Take 0.6 mg by mouth daily as  needed (for gout flare ups).      . diazepam (VALIUM) 5 MG tablet Take 5 mg by mouth 2 (two) times daily.       . furosemide (LASIX) 20 MG tablet Take 1 tablet (20 mg total) by mouth daily.  30 tablet  11  . HYDROcodone-acetaminophen (NORCO/VICODIN) 5-325 MG per tablet Take 1 tablet by mouth every 6 (six) hours as needed.      . isosorbide mononitrate (IMDUR) 60 MG 24 hr tablet Take 1 tablet (60 mg total) by mouth 2 (two) times daily.  180 tablet  3  . lisinopril (PRINIVIL,ZESTRIL) 2.5 MG tablet Take 1 tablet (2.5 mg total) by mouth 2 (two) times daily.  180 tablet  3  . nicotine (NICODERM CQ - DOSED IN MG/24 HOURS) 21 mg/24hr patch Place 21 mg onto the skin daily.      . nitroGLYCERIN (NITROSTAT) 0.4 MG SL tablet Place 1 tablet (0.4 mg total) under the tongue every 5 (five) minutes x 3 doses as needed for chest pain.  75 tablet  0  . Omega-3 Fatty Acids (FISH OIL) 1000 MG CAPS Take 1,000 mg by mouth 2 (two) times daily.      . pantoprazole (PROTONIX) 40 MG tablet Take 40 mg by mouth daily. Obtain further refills from primary MD       . pravastatin (PRAVACHOL) 80 MG tablet Take 1 tablet (80 mg total) by mouth every evening.  90  tablet  3  . ranolazine (RANEXA) 500 MG 12 hr tablet Take 1 tablet (500 mg total) by mouth 2 (two) times daily.  180 tablet  3   No current facility-administered medications for this visit.    Past Medical History  Diagnosis Date  . Coronary atherosclerosis of native coronary artery     a. CABG 2003. b. NSTEMI 2010 s/p BMS to SVG-RCA c. PTCA/stent/asp thromb to SVG-RCA 01/2010, PTCA to SVG-RCA 04/2010 d. known occlusion of SVG-RCA 2013. e. NSTEMI 07/2013  s/p PTCA/DES to SVG-diagonal/intermediate/OM1.   . Mixed hyperlipidemia   . Bipolar disorder   . GERD (gastroesophageal reflux disease)   . Gout   . Chronic back pain   . Tobacco abuse   . Ischemic cardiomyopathy     a. EF 35-40% by cath 07/2013.  . Sinus bradycardia     Past Surgical History  Procedure  Laterality Date  . Cholecystectomy    . Coronary artery bypass graft      2003, LIMA to LAD; SVG to diagonal; SVG to OM, ramus, and diagonal; SVG to RCA  . Pin placement left leg    . Metal plate to chin      Social History [name] [name] reports that he has been smoking Cigarettes.  He has a 26.4 pack-year smoking history. He has never used smokeless tobacco. [name] [name] reports that he does not drink alcohol.  Review of Systems No palpitations or syncope. No bleeding problems.  Physical Examination Filed Vitals:   01/12/14 1435  BP: 108/72  Pulse: 61   Filed Weights   01/12/14 1435  Weight: 218 lb (98.884 kg)    Overweight male Smells strongly of cigarettes. Comfortable at rest.  HEENT: Conjunctiva and lids normal, oropharynx clear.  Neck: Supple, no elevated JVP or carotid bruits, no thyromegaly.  Lungs: Clear to auscultation, nonlabored breathing at rest.  Cardiac: Regular rate and rhythm, no S3, no pericardial rub.  Abdomen: Soft, nontender, bowel sounds present.  Extremities: No pitting edema, distal pulses 2+.  Skin: Warm and dry.  Musculoskeletal: No kyphosis.  Neuropsychiatric: Alert and oriented x3, affect appropriate.   Problem List and Plan   CAD, NATIVE VESSEL Continue medical therapy, recent adjustments in the hospital noted. Blood pressure and heart rate look good today. Followup will be in 6 weeks, recheck BMET at that time. Reviewed the importance of continuing DAPT.  HYPERLIPIDEMIA-MIXED LDL 47 recently, continues on Pravachol and omega-3 supplements.  Ischemic cardiomyopathy Fortunately, LVEF improved to the range of 55-60% by recent echocardiogram. Continue medical therapy.  TOBACCO USER Continue to work on smoking cessation.    [name] [name], M.D., F.A.C.C.

## 2014-01-12 NOTE — Assessment & Plan Note (Signed)
Fortunately, LVEF improved to the range of 55-60% by recent echocardiogram. Continue medical therapy.

## 2014-01-12 NOTE — Assessment & Plan Note (Signed)
Continue to work on smoking cessation. 

## 2014-02-20 ENCOUNTER — Telehealth: Payer: Self-pay | Admitting: *Deleted

## 2014-02-20 NOTE — Telephone Encounter (Signed)
Message copied by Eustace Moore on Fri Feb 20, 2014  4:36 PM ------      Message from: Jonelle Sidle      Created: Tue Feb 17, 2014 12:09 PM       Reviewed. Normal creatinine of 0.8, normal potassium of 4.1. ------

## 2014-02-20 NOTE — Telephone Encounter (Signed)
Patient informed via vm. 

## 2014-02-23 ENCOUNTER — Other Ambulatory Visit: Payer: Self-pay | Admitting: Cardiology

## 2014-02-24 ENCOUNTER — Ambulatory Visit (INDEPENDENT_AMBULATORY_CARE_PROVIDER_SITE_OTHER): Payer: PRIVATE HEALTH INSURANCE | Admitting: Cardiology

## 2014-02-24 ENCOUNTER — Encounter: Payer: Self-pay | Admitting: Cardiology

## 2014-02-24 VITALS — BP 106/68 | HR 67 | Ht 63.0 in | Wt 210.8 lb

## 2014-02-24 DIAGNOSIS — F17200 Nicotine dependence, unspecified, uncomplicated: Secondary | ICD-10-CM

## 2014-02-24 DIAGNOSIS — I2510 Atherosclerotic heart disease of native coronary artery without angina pectoris: Secondary | ICD-10-CM

## 2014-02-24 DIAGNOSIS — E785 Hyperlipidemia, unspecified: Secondary | ICD-10-CM

## 2014-02-24 DIAGNOSIS — I10 Essential (primary) hypertension: Secondary | ICD-10-CM

## 2014-02-24 NOTE — Patient Instructions (Signed)
Your physician recommends that you schedule a follow-up appointment in: 3 months. Your physician recommends that you continue on your current medications as directed. Please refer to the Current Medication list given to you today. 

## 2014-02-24 NOTE — Assessment & Plan Note (Signed)
Blood pressure is normal today. 

## 2014-02-24 NOTE — Assessment & Plan Note (Signed)
We discussed smoking cessation again today. 

## 2014-02-24 NOTE — Progress Notes (Signed)
Clinical Summary [name] [name] is a 48 y.o.male last seen in April of this year. He has been doing well from a cardiac perspective since I saw him. No angina. He has been trying to walk for exercise and also using an elliptical machine. Still smoking but trying to cut back cigarettes. He reports compliance with his medications, no bleeding problems on DAPT.  Recent followup lab work showed BUN 10, creatinine 0.8, potassium 4.1.  Cardiac catheterization in March of this year showed severe native vessel CAD, occluded SVG to RCA, severely diseased SVG to ramus intermedius and obtuse marginal requiring DES in the proximal segment with cutting balloon angioplasty. Followup echocardiogram done in March of this year reported mild LVH with LVEF 55-60%, difficult to assess focal wall motion, no major valvular abnormalities described.     No Known Allergies  Current Outpatient Prescriptions  Medication Sig Dispense Refill  . albuterol (PROVENTIL HFA;VENTOLIN HFA) 108 (90 BASE) MCG/ACT inhaler Inhale 2 puffs into the lungs every 6 (six) hours as needed for wheezing or shortness of breath.      . allopurinol (ZYLOPRIM) 300 MG tablet Take 300 mg by mouth daily as needed (for gout flare ups).      Marland Kitchen aspirin EC 81 MG tablet Take 1 tablet (81 mg total) by mouth daily.      . baclofen (LIORESAL) 10 MG tablet Take 10 mg by mouth See admin instructions. Three times daily as needed for spasms. Typically takes 2 times a day      . carvedilol (COREG) 6.25 MG tablet Take 1 tablet (6.25 mg total) by mouth 2 (two) times daily with a meal.  60 tablet  6  . clopidogrel (PLAVIX) 75 MG tablet Take 1 tablet (75 mg total) by mouth daily.  30 tablet  11  . colchicine 0.6 MG tablet Take 0.6 mg by mouth daily as needed (for gout flare ups).      . diazepam (VALIUM) 5 MG tablet Take 5 mg by mouth 2 (two) times daily.       . furosemide (LASIX) 20 MG tablet Take 1 tablet (20 mg total) by mouth daily.  30 tablet  11  .  HYDROcodone-acetaminophen (NORCO/VICODIN) 5-325 MG per tablet Take 1 tablet by mouth every 6 (six) hours as needed.      . isosorbide mononitrate (IMDUR) 60 MG 24 hr tablet Take 1 tablet (60 mg total) by mouth 2 (two) times daily.  180 tablet  3  . lisinopril (PRINIVIL,ZESTRIL) 2.5 MG tablet Take 1 tablet (2.5 mg total) by mouth 2 (two) times daily.  180 tablet  3  . NITROSTAT 0.4 MG SL tablet PLACE 1 TABLET UNDER THE TONGUE EVERY 5 MINS FOR 3 DOSES AS NEEDED FOR CHEST PAIN  75 tablet  1  . Omega-3 Fatty Acids (FISH OIL) 1000 MG CAPS Take 1,000 mg by mouth 2 (two) times daily.      . pantoprazole (PROTONIX) 40 MG tablet Take 40 mg by mouth daily. Obtain further refills from primary MD       . pravastatin (PRAVACHOL) 80 MG tablet Take 1 tablet (80 mg total) by mouth every evening.  90 tablet  3  . ranolazine (RANEXA) 500 MG 12 hr tablet Take 1 tablet (500 mg total) by mouth 2 (two) times daily.  180 tablet  3   No current facility-administered medications for this visit.    Past Medical History  Diagnosis Date  . Coronary atherosclerosis of native coronary artery  a. CABG 2003. b. NSTEMI 2010 s/p BMS to SVG-RCA c. PTCA/stent/asp thromb to SVG-RCA 01/2010, PTCA to SVG-RCA 04/2010 d. known occlusion of SVG-RCA 2013. e. NSTEMI 07/2013  s/p PTCA/DES to SVG-diagonal/intermediate/OM1.   . Mixed hyperlipidemia   . Bipolar disorder   . GERD (gastroesophageal reflux disease)   . Gout   . Chronic back pain   . Tobacco abuse   . Ischemic cardiomyopathy     a. EF 35-40% by cath 07/2013.  . Sinus bradycardia     Past Surgical History  Procedure Laterality Date  . Cholecystectomy    . Coronary artery bypass graft      2003, LIMA to LAD; SVG to diagonal; SVG to OM, ramus, and diagonal; SVG to RCA  . Pin placement left leg    . Metal plate to chin      Social History [name] [name] reports that he has been smoking Cigarettes.  He has a 26.4 pack-year smoking history. He has never used smokeless  tobacco. [name] [name] reports that he does not drink alcohol.  Review of Systems Negative except as outlined.  Physical Examination Filed Vitals:   02/24/14 1407  BP: 106/68  Pulse: 67   Filed Weights   02/24/14 1407  Weight: 210 lb 12.8 oz (95.618 kg)    Overweight male  Comfortable at rest.  HEENT: Conjunctiva and lids normal, oropharynx clear.  Neck: Supple, no elevated JVP or carotid bruits, no thyromegaly.  Lungs: Clear to auscultation, nonlabored breathing at rest.  Cardiac: Regular rate and rhythm, no S3, no pericardial rub.  Abdomen: Soft, nontender, bowel sounds present.  Extremities: No pitting edema, distal pulses 2+.    Problem List and Plan   CAD, NATIVE VESSEL Continue medical therapy and observation. No changes made to medical regimen today.  TOBACCO USER We discussed smoking cessation again today.  HYPERLIPIDEMIA-MIXED He continues on Pravachol.  Essential hypertension, benign Blood pressure is normal today.    Jonelle SidleSamuel G. McDowell, M.D., F.A.C.C.

## 2014-02-24 NOTE — Assessment & Plan Note (Signed)
Continue medical therapy and observation. No changes made to medical regimen today.

## 2014-02-24 NOTE — Assessment & Plan Note (Signed)
He continues on Pravachol. 

## 2014-04-05 ENCOUNTER — Encounter (HOSPITAL_COMMUNITY): Payer: Self-pay | Admitting: Emergency Medicine

## 2014-04-05 ENCOUNTER — Observation Stay (HOSPITAL_COMMUNITY)
Admission: EM | Admit: 2014-04-05 | Discharge: 2014-04-06 | Disposition: A | Discharge status: Home / Self Care | Payer: PRIVATE HEALTH INSURANCE | Attending: Family Medicine | Admitting: Family Medicine

## 2014-04-05 ENCOUNTER — Other Ambulatory Visit: Payer: Self-pay

## 2014-04-05 ENCOUNTER — Emergency Department (HOSPITAL_COMMUNITY): Payer: PRIVATE HEALTH INSURANCE

## 2014-04-05 DIAGNOSIS — R0789 Other chest pain: Principal | ICD-10-CM | POA: Insufficient documentation

## 2014-04-05 DIAGNOSIS — Z7982 Long term (current) use of aspirin: Secondary | ICD-10-CM | POA: Insufficient documentation

## 2014-04-05 DIAGNOSIS — I255 Ischemic cardiomyopathy: Secondary | ICD-10-CM

## 2014-04-05 DIAGNOSIS — F17200 Nicotine dependence, unspecified, uncomplicated: Secondary | ICD-10-CM | POA: Diagnosis not present

## 2014-04-05 DIAGNOSIS — R61 Generalized hyperhidrosis: Secondary | ICD-10-CM | POA: Diagnosis not present

## 2014-04-05 DIAGNOSIS — I498 Other specified cardiac arrhythmias: Secondary | ICD-10-CM | POA: Diagnosis not present

## 2014-04-05 DIAGNOSIS — R001 Bradycardia, unspecified: Secondary | ICD-10-CM

## 2014-04-05 DIAGNOSIS — K219 Gastro-esophageal reflux disease without esophagitis: Secondary | ICD-10-CM | POA: Diagnosis not present

## 2014-04-05 DIAGNOSIS — M549 Dorsalgia, unspecified: Secondary | ICD-10-CM | POA: Diagnosis not present

## 2014-04-05 DIAGNOSIS — M109 Gout, unspecified: Secondary | ICD-10-CM | POA: Insufficient documentation

## 2014-04-05 DIAGNOSIS — Z7902 Long term (current) use of antithrombotics/antiplatelets: Secondary | ICD-10-CM | POA: Insufficient documentation

## 2014-04-05 DIAGNOSIS — I10 Essential (primary) hypertension: Secondary | ICD-10-CM

## 2014-04-05 DIAGNOSIS — M542 Cervicalgia: Secondary | ICD-10-CM | POA: Diagnosis not present

## 2014-04-05 DIAGNOSIS — E785 Hyperlipidemia, unspecified: Secondary | ICD-10-CM | POA: Diagnosis not present

## 2014-04-05 DIAGNOSIS — Z951 Presence of aortocoronary bypass graft: Secondary | ICD-10-CM | POA: Diagnosis not present

## 2014-04-05 DIAGNOSIS — Z79899 Other long term (current) drug therapy: Secondary | ICD-10-CM | POA: Insufficient documentation

## 2014-04-05 DIAGNOSIS — E782 Mixed hyperlipidemia: Secondary | ICD-10-CM | POA: Diagnosis present

## 2014-04-05 DIAGNOSIS — I25110 Atherosclerotic heart disease of native coronary artery with unstable angina pectoris: Secondary | ICD-10-CM

## 2014-04-05 DIAGNOSIS — G8929 Other chronic pain: Secondary | ICD-10-CM | POA: Diagnosis not present

## 2014-04-05 DIAGNOSIS — R079 Chest pain, unspecified: Secondary | ICD-10-CM | POA: Diagnosis present

## 2014-04-05 DIAGNOSIS — I2589 Other forms of chronic ischemic heart disease: Secondary | ICD-10-CM | POA: Insufficient documentation

## 2014-04-05 DIAGNOSIS — I2510 Atherosclerotic heart disease of native coronary artery without angina pectoris: Secondary | ICD-10-CM | POA: Diagnosis not present

## 2014-04-05 DIAGNOSIS — Z720 Tobacco use: Secondary | ICD-10-CM

## 2014-04-05 DIAGNOSIS — I200 Unstable angina: Secondary | ICD-10-CM | POA: Diagnosis present

## 2014-04-05 DIAGNOSIS — F319 Bipolar disorder, unspecified: Secondary | ICD-10-CM | POA: Diagnosis not present

## 2014-04-05 LAB — CBC WITH DIFFERENTIAL/PLATELET
BASOS PCT: 0 % (ref 0–1)
Basophils Absolute: 0 10*3/uL (ref 0.0–0.1)
EOS ABS: 0.1 10*3/uL (ref 0.0–0.7)
Eosinophils Relative: 1 % (ref 0–5)
HCT: 41 % (ref 39.0–52.0)
Hemoglobin: 14.5 g/dL (ref 13.0–17.0)
LYMPHS ABS: 2.4 10*3/uL (ref 0.7–4.0)
Lymphocytes Relative: 35 % (ref 12–46)
MCH: 34.7 pg — AB (ref 26.0–34.0)
MCHC: 35.4 g/dL (ref 30.0–36.0)
MCV: 98.1 fL (ref 78.0–100.0)
Monocytes Absolute: 0.5 10*3/uL (ref 0.1–1.0)
Monocytes Relative: 7 % (ref 3–12)
NEUTROS PCT: 57 % (ref 43–77)
Neutro Abs: 3.8 10*3/uL (ref 1.7–7.7)
Platelets: 201 10*3/uL (ref 150–400)
RBC: 4.18 MIL/uL — AB (ref 4.22–5.81)
RDW: 13 % (ref 11.5–15.5)
WBC: 6.7 10*3/uL (ref 4.0–10.5)

## 2014-04-05 LAB — COMPREHENSIVE METABOLIC PANEL
ALBUMIN: 3.9 g/dL (ref 3.5–5.2)
ALT: 19 U/L (ref 0–53)
AST: 19 U/L (ref 0–37)
Alkaline Phosphatase: 52 U/L (ref 39–117)
BUN: 12 mg/dL (ref 6–23)
CO2: 27 mEq/L (ref 19–32)
Calcium: 9.4 mg/dL (ref 8.4–10.5)
Chloride: 97 mEq/L (ref 96–112)
Creatinine, Ser: 0.88 mg/dL (ref 0.50–1.35)
GFR calc Af Amer: >90 mL/min (ref >90)
GFR calc non Af Amer: >90 mL/min (ref >90)
Glucose, Bld: 130 mg/dL — ABNORMAL HIGH (ref 70–99)
Potassium: 3.7 mEq/L (ref 3.7–5.3)
SODIUM: 136 meq/L — AB (ref 137–147)
TOTAL PROTEIN: 7.3 g/dL (ref 6.0–8.3)
Total Bilirubin: 0.3 mg/dL (ref 0.3–1.2)

## 2014-04-05 LAB — I-STAT TROPONIN, ED: Troponin i, poc: 0.01 ng/mL (ref 0.00–0.08)

## 2014-04-05 MED ORDER — NITROGLYCERIN 2 % TD OINT
1.0000 [in_us] | TOPICAL_OINTMENT | Freq: Once | TRANSDERMAL | Status: AC
  Administered 2014-04-06: 1 [in_us] via TOPICAL
  Filled 2014-04-05: qty 1

## 2014-04-05 NOTE — ED Notes (Addendum)
Previous MI with quadruple bypass, stents in past from previous heart cath after bypass surgery to balloon grafts. Chest tightness presents prior to MI in past.

## 2014-04-05 NOTE — ED Provider Notes (Signed)
CSN: [id number]     Arrival date & time 04/05/14  2100 History   This chart was scribed for [name] MelterElliott L Wentz, MD, by Yevette EdwardsAngela Bracken, ED Scribe. This patient was seen in room APA17/APA17 and the patient's care was started at 10:51 PM.  First MD Initiated Contact with Patient 04/05/14 2240     Chief Complaint  Patient presents with  . Chest Pain    The history is provided by the patient. No language interpreter was used.   HPI Comments: [name] ArenasSteven D [name] is a [age] y.o. male, with a h/o  ischemic cardiomyopathy and CAGB, who presents to the Emergency Department complaining of left-sided chest pain which he characterizes as "tightness" and which began three hours ago while he was lying in bed; the pain is resolved in the ED. He used a nitro without relief; he reports that sitting up improved the pain. The pt also reports the chest pain radiates to his neck and back. He reports he has had 6 similar episodes in the past three weeks, and he has used nitroglycerin with 3 of the episodes. He reports the chest pain occurs both at rest and with exertion. He also endorses night-sweats. He denies nausea or emesis.   He reports that in March of 2015, he was informed his cardiac stent was 95% blocked. The pt is a current smoker, but he reports he quit smoking marijuana approximately 10 months ago. [name] also endorses a h/o high cholesterol and borderline DM. His PCP is Dr. Olena LeatherwoodHasanaj and his cardiologist is with [facility].   Past Medical History  Diagnosis Date  . Coronary atherosclerosis of native coronary artery     a. CABG 2003. b. NSTEMI 2010 s/p BMS to SVG-RCA c. PTCA/stent/asp thromb to SVG-RCA 01/2010, PTCA to SVG-RCA 04/2010 d. known occlusion of SVG-RCA 2013. e. NSTEMI 07/2013  s/p PTCA/DES to SVG-diagonal/intermediate/OM1.   . Mixed hyperlipidemia   . Bipolar disorder   . GERD (gastroesophageal reflux disease)   . Gout   . Chronic back pain   . Tobacco abuse   . Ischemic cardiomyopathy     a. EF  35-40% by cath 07/2013.  . Sinus bradycardia    Past Surgical History  Procedure Laterality Date  . Cholecystectomy    . Coronary artery bypass graft      2003, LIMA to LAD; SVG to diagonal; SVG to OM, ramus, and diagonal; SVG to RCA  . Pin placement left leg    . Metal plate to chin     Family History  Problem Relation Age of Onset  . Stroke      family Hx    History  Substance Use Topics  . Smoking status: Current Every Day Smoker -- 0.80 packs/day for 33 years    Types: Cigarettes  . Smokeless tobacco: Never Used     Comment: trying to quit, 3/4 pack per day. Started the nicotine patch on 01-10-14  . Alcohol Use: No    Review of Systems  Constitutional: Positive for diaphoresis.  Cardiovascular: Positive for chest pain.  Gastrointestinal: Negative for nausea and vomiting.  Musculoskeletal: Positive for back pain and neck pain.  All other systems reviewed and are negative.    Allergies  Review of patient's allergies indicates no known allergies.  Home Medications   Prior to Admission medications   Medication Sig Start Date End Date Taking? Authorizing Lavenia Stumpo  albuterol (PROVENTIL HFA;VENTOLIN HFA) 108 (90 BASE) MCG/ACT inhaler Inhale 2 puffs into the lungs every 6 (six)  hours as needed for wheezing or shortness of breath.   Yes Historical Nickoli Bagheri, MD  aspirin EC 81 MG tablet Take 1 tablet (81 mg total) by mouth daily. 12/05/12  Yes Rande Brunt, PA-C  carvedilol (COREG) 6.25 MG tablet Take 1 tablet (6.25 mg total) by mouth 2 (two) times daily with a meal. 12/30/13  Yes Rhonda G Barrett, PA-C  clopidogrel (PLAVIX) 75 MG tablet Take 1 tablet (75 mg total) by mouth daily. 07/15/13  Yes Dayna N Dunn, PA-C  diazepam (VALIUM) 5 MG tablet Take 5 mg by mouth 2 (two) times daily.    Yes Historical Morocco Gipe, MD  furosemide (LASIX) 20 MG tablet Take 20 mg by mouth at bedtime.   Yes Historical Chasiti Waddington, MD  HYDROcodone-acetaminophen (NORCO/VICODIN) 5-325 MG per tablet Take 1  tablet by mouth every 6 (six) hours as needed for moderate pain or severe pain.  11/20/13  Yes Historical Kenedy Haisley, MD  isosorbide mononitrate (IMDUR) 60 MG 24 hr tablet Take 1 tablet (60 mg total) by mouth 2 (two) times daily. 12/05/13  Yes Jonelle Sidle, MD  lisinopril (PRINIVIL,ZESTRIL) 2.5 MG tablet Take 1 tablet (2.5 mg total) by mouth 2 (two) times daily. 12/05/13  Yes Jonelle Sidle, MD  nitroGLYCERIN (NITROSTAT) 0.4 MG SL tablet Place 0.4 mg under the tongue every 5 (five) minutes as needed for chest pain.   Yes Historical Dent Plantz, MD  Omega-3 Fatty Acids (FISH OIL) 1000 MG CAPS Take 1,000 mg by mouth 2 (two) times daily.   Yes Historical Camylle Whicker, MD  pantoprazole (PROTONIX) 40 MG tablet Take 40 mg by mouth daily.    Yes Historical Whalen Trompeter, MD  pravastatin (PRAVACHOL) 80 MG tablet Take 1 tablet (80 mg total) by mouth every evening. 11/10/13  Yes Jonelle Sidle, MD  ranolazine (RANEXA) 500 MG 12 hr tablet Take 1 tablet (500 mg total) by mouth 2 (two) times daily. 10/06/13  Yes Jonelle Sidle, MD   Triage Vitals: BP 106/71  Pulse 67  Temp(Src) 97.8 F (36.6 C) (Oral)  Resp 22  Ht 5\' 4"  (1.626 m)  Wt 208 lb (94.348 kg)  BMI 35.69 kg/m2  SpO2 92%  Physical Exam  Nursing note and vitals reviewed. Constitutional: He is oriented to person, place, and time. He appears well-developed and well-nourished.  HENT:  Head: Normocephalic and atraumatic.  Right Ear: External ear normal.  Left Ear: External ear normal.  Eyes: Conjunctivae and EOM are normal. Pupils are equal, round, and reactive to light.  Neck: Normal range of motion and phonation normal. Neck supple.  Cardiovascular: Normal rate, regular rhythm, normal heart sounds and intact distal pulses.   Pulmonary/Chest: Effort normal. No respiratory distress. He exhibits no bony tenderness.  Rhonchi bilaterally.   Abdominal: Soft. There is no tenderness.  Musculoskeletal: Normal range of motion.  Neurological: He is alert  and oriented to person, place, and time. No cranial nerve deficit or sensory deficit. He exhibits normal muscle tone. Coordination normal.  Skin: Skin is warm, dry and intact.  Psychiatric: He has a normal mood and affect. His behavior is normal. Judgment and thought content normal.    ED Course  Procedures (including critical care time)  DIAGNOSTIC STUDIES: Triage Vitals: Oxygen Saturation is 92% on room air, adequate by my interpretation.    COORDINATION OF CARE:  Medications  nitroGLYCERIN (NITROGLYN) 2 % ointment 1 inch (not administered)    Patient Vitals for the past 24 hrs:  BP Temp Temp src Pulse Resp SpO2 Height  Weight  04/05/14 2330 102/64 mmHg - - 60 20 94 % - -  04/05/14 2300 105/66 mmHg - - 58 15 97 % - -  04/05/14 2245 - - - 61 11 97 % - -  04/05/14 2230 101/68 mmHg - - 57 16 96 % - -  04/05/14 2145 - - - 67 22 92 % - -  04/05/14 2130 106/71 mmHg - - 67 16 97 % - -  04/05/14 2104 124/75 mmHg 97.8 F (36.6 C) Oral 65 20 97 % 5\' 4"  (1.626 m) 208 lb (94.348 kg)    11:00 PM- Discussed treatment plan with patient, and the patient agreed to the plan. The plan includes lab work.   00:10  AM Reevaluation with update and discussion. After initial assessment and treatment, an updated evaluation reveals he remains uncomfortable, now, having back, and chest pain. His NTG has not been started yet. [name],ELLIOTT L   12:07 AM-Consult complete with Dr. Don Broach. Patient case explained and discussed. He states that the patient does not have high risk features, and can be observed here and get [facility] evaluation in the morning.  Call ended at 00:25  12:30 AM-Consult complete with Dr. Onalee Hua. Patient case explained and discussed. She agrees to admit patient for further evaluation and treatment. Call ended at 00:35  CRITICAL CARE Performed by: [name] Melter Total critical care time: 35 minutes Critical care time was exclusive of separately billable procedures and treating other  patients. Critical care was necessary to treat or prevent imminent or life-threatening deterioration. Critical care was time spent personally by me on the following activities: development of treatment plan with patient and/or surrogate as well as nursing, discussions with consultants, evaluation of patient's response to treatment, examination of patient, obtaining history from patient or surrogate, ordering and performing treatments and interventions, ordering and review of laboratory studies, ordering and review of radiographic studies, pulse oximetry and re-evaluation of patient's condition.    Labs Review Labs Reviewed  CBC WITH DIFFERENTIAL - Abnormal; Notable for the following:    RBC 4.18 (*)    MCH 34.7 (*)    All other components within normal limits  COMPREHENSIVE METABOLIC PANEL - Abnormal; Notable for the following:    Sodium 136 (*)    Glucose, Bld 130 (*)    All other components within normal limits  I-STAT TROPOININ, ED    Imaging Review No results found.   EKG Interpretation   Date/Time:  Sunday April 05 2014 21:07:12 EDT Ventricular Rate:  65 PR Interval:  164 QRS Duration: 84 QT Interval:  398 QTC Calculation: 413 R Axis:   74 Text Interpretation:  Normal sinus rhythm Nonspecific ST and T wave  abnormality Abnormal ECG since last tracing no significant change  Confirmed by Effie Shy  MD, Mechele Collin ([id number]) on 04/05/2014 11:43:47 PM      MDM   Final diagnoses:  Chest pain, unspecified chest pain type  Tobacco abuse  Coronary artery disease involving native coronary artery of native heart without angina pectoris    Nonspecific chest pain, atypical for coronary ischemia, or myocardial infarction. He did have recent cardiac catheterization and required angioplasty. He's been using nitroglycerin, frequently, in the last 2 weeks. His initial examination does not indicate any myocardial infarction. Patient will need an additional period of observation and likely  consultation with his cardiologist. He continues to smoke and that is probably contributing to his chest pain.  Nursing Notes Reviewed/ Care Coordinated, and agree without changes. Applicable Imaging Reviewed.  Interpretation of Laboratory Data incorporated into ED treatment  I personally performed the services described in this documentation, which was scribed in my presence. The recorded information has been reviewed and is accurate.      [name] Melter, MD 04/06/14 [phone]

## 2014-04-06 ENCOUNTER — Encounter (HOSPITAL_COMMUNITY): Payer: Self-pay | Admitting: *Deleted

## 2014-04-06 DIAGNOSIS — R0789 Other chest pain: Secondary | ICD-10-CM | POA: Diagnosis not present

## 2014-04-06 DIAGNOSIS — I200 Unstable angina: Secondary | ICD-10-CM

## 2014-04-06 DIAGNOSIS — I2510 Atherosclerotic heart disease of native coronary artery without angina pectoris: Secondary | ICD-10-CM

## 2014-04-06 DIAGNOSIS — R079 Chest pain, unspecified: Secondary | ICD-10-CM

## 2014-04-06 DIAGNOSIS — I2589 Other forms of chronic ischemic heart disease: Secondary | ICD-10-CM

## 2014-04-06 DIAGNOSIS — F17200 Nicotine dependence, unspecified, uncomplicated: Secondary | ICD-10-CM

## 2014-04-06 LAB — MRSA PCR SCREENING: MRSA BY PCR: NEGATIVE

## 2014-04-06 LAB — TROPONIN I
Troponin I: <0.3 ng/mL (ref <0.30)
Troponin I: <0.3 ng/mL (ref <0.30)

## 2014-04-06 MED ORDER — NITROGLYCERIN 2 % TD OINT
0.5000 [in_us] | TOPICAL_OINTMENT | Freq: Three times a day (TID) | TRANSDERMAL | Status: DC
  Administered 2014-04-06: 0.5 [in_us] via TOPICAL
  Filled 2014-04-06: qty 1

## 2014-04-06 MED ORDER — LISINOPRIL 5 MG PO TABS
2.5000 mg | ORAL_TABLET | Freq: Two times a day (BID) | ORAL | Status: DC
  Administered 2014-04-06 (×2): 2.5 mg via ORAL
  Filled 2014-04-06 (×2): qty 1

## 2014-04-06 MED ORDER — RANOLAZINE ER 500 MG PO TB12
500.0000 mg | ORAL_TABLET | Freq: Two times a day (BID) | ORAL | Status: DC
  Administered 2014-04-06 (×2): 500 mg via ORAL
  Filled 2014-04-06 (×6): qty 1

## 2014-04-06 MED ORDER — SODIUM CHLORIDE 0.9 % IV BOLUS (SEPSIS)
500.0000 mL | Freq: Once | INTRAVENOUS | Status: AC
  Administered 2014-04-06: 500 mL via INTRAVENOUS

## 2014-04-06 MED ORDER — CLOPIDOGREL BISULFATE 75 MG PO TABS
75.0000 mg | ORAL_TABLET | Freq: Every day | ORAL | Status: DC
  Administered 2014-04-06: 75 mg via ORAL
  Filled 2014-04-06: qty 1

## 2014-04-06 MED ORDER — RANOLAZINE ER 500 MG PO TB12
ORAL_TABLET | ORAL | Status: AC
  Filled 2014-04-06: qty 1

## 2014-04-06 MED ORDER — CARVEDILOL 3.125 MG PO TABS
6.2500 mg | ORAL_TABLET | Freq: Two times a day (BID) | ORAL | Status: DC
  Administered 2014-04-06: 6.25 mg via ORAL
  Filled 2014-04-06: qty 2

## 2014-04-06 MED ORDER — NITROGLYCERIN 0.4 MG SL SUBL: 0.4000 mg | SUBLINGUAL_TABLET | SUBLINGUAL | Status: DC | PRN

## 2014-04-06 MED ORDER — ACETAMINOPHEN 325 MG PO TABS: 650.0000 mg | ORAL_TABLET | ORAL | Status: DC | PRN

## 2014-04-06 MED ORDER — DIAZEPAM 5 MG PO TABS
5.0000 mg | ORAL_TABLET | Freq: Two times a day (BID) | ORAL | Status: DC
  Administered 2014-04-06 (×2): 5 mg via ORAL
  Filled 2014-04-06 (×2): qty 1

## 2014-04-06 MED ORDER — ONDANSETRON HCL 4 MG/2ML IJ SOLN
4.0000 mg | Freq: Once | INTRAMUSCULAR | Status: AC
  Administered 2014-04-06: 4 mg via INTRAVENOUS
  Filled 2014-04-06: qty 2

## 2014-04-06 MED ORDER — ONDANSETRON HCL 4 MG/2ML IJ SOLN: 4.0000 mg | Freq: Four times a day (QID) | INTRAMUSCULAR | Status: DC | PRN

## 2014-04-06 MED ORDER — ISOSORBIDE MONONITRATE ER 60 MG PO TB24
60.0000 mg | ORAL_TABLET | Freq: Two times a day (BID) | ORAL | Status: DC
  Administered 2014-04-06 (×2): 60 mg via ORAL
  Filled 2014-04-06 (×2): qty 1

## 2014-04-06 MED ORDER — ASPIRIN 81 MG PO CHEW
324.0000 mg | CHEWABLE_TABLET | Freq: Once | ORAL | Status: AC
  Administered 2014-04-06: 324 mg via ORAL
  Filled 2014-04-06: qty 4

## 2014-04-06 MED ORDER — FUROSEMIDE 20 MG PO TABS
20.0000 mg | ORAL_TABLET | Freq: Every day | ORAL | Status: DC
  Administered 2014-04-06: 20 mg via ORAL
  Filled 2014-04-06: qty 1

## 2014-04-06 MED ORDER — MORPHINE SULFATE 4 MG/ML IJ SOLN
4.0000 mg | Freq: Once | INTRAMUSCULAR | Status: AC
  Administered 2014-04-06: 4 mg via INTRAVENOUS
  Filled 2014-04-06: qty 1

## 2014-04-06 MED ORDER — ALBUTEROL SULFATE (2.5 MG/3ML) 0.083% IN NEBU: 3.0000 mL | INHALATION_SOLUTION | Freq: Four times a day (QID) | RESPIRATORY_TRACT | Status: DC | PRN

## 2014-04-06 MED ORDER — HYDROCODONE-ACETAMINOPHEN 5-325 MG PO TABS
1.0000 | ORAL_TABLET | Freq: Four times a day (QID) | ORAL | Status: DC | PRN
  Administered 2014-04-06: 1 via ORAL
  Filled 2014-04-06: qty 1

## 2014-04-06 MED ORDER — PANTOPRAZOLE SODIUM 40 MG PO TBEC
40.0000 mg | DELAYED_RELEASE_TABLET | Freq: Every day | ORAL | Status: DC
  Administered 2014-04-06: 40 mg via ORAL
  Filled 2014-04-06: qty 1

## 2014-04-06 MED ORDER — ASPIRIN EC 81 MG PO TBEC
81.0000 mg | DELAYED_RELEASE_TABLET | Freq: Every day | ORAL | Status: DC
  Administered 2014-04-06: 81 mg via ORAL
  Filled 2014-04-06: qty 1

## 2014-04-06 NOTE — Discharge Summary (Signed)
Patient seen, independently examined and chart reviewed. I agree with exam, assessment and plan discussed with Toya Smothers, NP.  48 year old male with history of coronary artery disease, CABG, status post stenting 3 months ago, presented to the emergency department with chest pain that did not improve with nitroglycerin and was associated with radiation to his left arm. He was admitted for further evaluation for chest pain and possible unstable angina, cardiology consultation.  PMH Coronary artery disease, CABG Tobacco dependence  History of present illness/subjective: He was evaluated by cardiology who has cleared him for discharge and has recommended medical management with current therapy. He will followup in one month.  Objective: Afebrile, vital signs are stable. No hypoxia.  Gen. Appears calm and comfortable.  Psych. Alert. Speech fluent and clear.  Cardiovascular. Regular rate and rhythm. No murmur, rub or gallop. No lower extremity edema. Telemetry sinus rhythm. No arrhythmias.  Respiratory. Clear to auscultation bilaterally. No wheezes, rales or rhonchi. Normal respiratory effort   Chemistry: Complete metabolic panel unremarkable. CBC normal. Troponins negative.  Imaging: Chest x-ray no acute changes.  EKG sinus rhythm with no acute changes.  Chest pain, resolved, secondary to angina. Cardiology has recommended discharge home with no further evaluation at this point, continue chronic medications. Tobacco dependence.  Recommend cessation of smoking. He will continue his current medical therapy which includes aspirin, Coreg, Plavix, Imdur, Ranexa, Pravachol, nitroglycerin SL PRN.   Brendia Sacks, MD [facility] [phone]

## 2014-04-06 NOTE — H&P (Signed)
PCP:   [name],[name], MD   Chief Complaint:  cp  HPI: 48 yo male h/o angina, CAD/CABG, last stenting 3 months ago, ICM ef 35% comes in with sscp that last for about 30 minutes tonight after taking one of his ntg pills.  He normally takes one ntg pill a day for his angina.  Tonight it was not relieved with that, and it started radiating down his left arm briefly than resolved before he came to the ED.  Pt denies any fevers.  No cough.  No n/vd.  No swelling.  He was smoking 5 PPD of cigerettes for many years, and is down to only 1.5 PPD of smoking in the last couple of months.  He has quit smoking pot totally about 3 months ago.    Review of Systems:  Positive and negative as per HPI otherwise all other systems are negative  Past Medical History: Past Medical History  Diagnosis Date  . Coronary atherosclerosis of native coronary artery     a. CABG 2003. b. NSTEMI 2010 s/p BMS to SVG-RCA c. PTCA/stent/asp thromb to SVG-RCA 01/2010, PTCA to SVG-RCA 04/2010 d. known occlusion of SVG-RCA 2013. e. NSTEMI 07/2013  s/p PTCA/DES to SVG-diagonal/intermediate/OM1.   . Mixed hyperlipidemia   . Bipolar disorder   . GERD (gastroesophageal reflux disease)   . Gout   . Chronic back pain   . Tobacco abuse   . Ischemic cardiomyopathy     a. EF 35-40% by cath 07/2013.  . Sinus bradycardia    Past Surgical History  Procedure Laterality Date  . Cholecystectomy    . Coronary artery bypass graft      2003, LIMA to LAD; SVG to diagonal; SVG to OM, ramus, and diagonal; SVG to RCA  . Pin placement left leg    . Metal plate to chin      Medications: Prior to Admission medications   Medication Sig Start Date End Date Taking? Authorizing Cambry Spampinato  albuterol (PROVENTIL HFA;VENTOLIN HFA) 108 (90 BASE) MCG/ACT inhaler Inhale 2 puffs into the lungs every 6 (six) hours as needed for wheezing or shortness of breath.   Yes Historical Odile Veloso, MD  aspirin EC 81 MG tablet Take 1 tablet (81 mg total) by mouth  daily. 12/05/12  Yes Rande Brunt, PA-C  carvedilol (COREG) 6.25 MG tablet Take 1 tablet (6.25 mg total) by mouth 2 (two) times daily with a meal. 12/30/13  Yes Rhonda G Barrett, PA-C  clopidogrel (PLAVIX) 75 MG tablet Take 1 tablet (75 mg total) by mouth daily. 07/15/13  Yes Dayna N Dunn, PA-C  diazepam (VALIUM) 5 MG tablet Take 5 mg by mouth 2 (two) times daily.    Yes Historical Zyara Riling, MD  furosemide (LASIX) 20 MG tablet Take 20 mg by mouth at bedtime.   Yes Historical Arlette Schaad, MD  HYDROcodone-acetaminophen (NORCO/VICODIN) 5-325 MG per tablet Take 1 tablet by mouth every 6 (six) hours as needed for moderate pain or severe pain.  11/20/13  Yes Historical Keenon Leitzel, MD  isosorbide mononitrate (IMDUR) 60 MG 24 hr tablet Take 1 tablet (60 mg total) by mouth 2 (two) times daily. 12/05/13  Yes Jonelle Sidle, MD  lisinopril (PRINIVIL,ZESTRIL) 2.5 MG tablet Take 1 tablet (2.5 mg total) by mouth 2 (two) times daily. 12/05/13  Yes Jonelle Sidle, MD  nitroGLYCERIN (NITROSTAT) 0.4 MG SL tablet Place 0.4 mg under the tongue every 5 (five) minutes as needed for chest pain.   Yes Historical Katianna Mcclenney, MD  Omega-3 Fatty  Acids (FISH OIL) 1000 MG CAPS Take 1,000 mg by mouth 2 (two) times daily.   Yes Historical Quaron Delacruz, MD  pantoprazole (PROTONIX) 40 MG tablet Take 40 mg by mouth daily.    Yes Historical Lolitha Tortora, MD  pravastatin (PRAVACHOL) 80 MG tablet Take 1 tablet (80 mg total) by mouth every evening. 11/10/13  Yes Jonelle SidleSamuel G McDowell, MD  ranolazine (RANEXA) 500 MG 12 hr tablet Take 1 tablet (500 mg total) by mouth 2 (two) times daily. 10/06/13  Yes Jonelle SidleSamuel G McDowell, MD    Allergies:  No Known Allergies  Social History:  reports that he has been smoking Cigarettes.  He has a 26.4 pack-year smoking history. He has never used smokeless tobacco. He reports that he uses illicit drugs. He reports that he does not drink alcohol.  Family History: Family History  Problem Relation Age of Onset  . Stroke       family Hx     Physical Exam: Filed Vitals:   04/05/14 2300 04/05/14 2330 04/06/14 0000 04/06/14 0030  BP: 105/66 102/64 102/59 107/55  Pulse: 58 60 71 59  Temp:      TempSrc:      Resp: 15 20 23 13   Height:      Weight:      SpO2: 97% 94% 92% 93%   General appearance: alert, cooperative and no distress Head: Normocephalic, without obvious abnormality, atraumatic Eyes: negative Nose: Nares normal. Septum midline. Mucosa normal. No drainage or sinus tenderness. Neck: no JVD and supple, symmetrical, trachea midline Lungs: clear to auscultation bilaterally Heart: regular rate and rhythm, S1, S2 normal, no murmur, click, rub or gallop Abdomen: soft, non-tender; bowel sounds normal; no masses,  no organomegaly Extremities: extremities normal, atraumatic, no cyanosis or edema Pulses: 2+ and symmetric Skin: Skin color, texture, turgor normal. No rashes or lesions Neurologic: Grossly normal    Labs on Admission:   Recent Labs  04/05/14 2116  NA 136*  K 3.7  CL 97  CO2 27  GLUCOSE 130*  BUN 12  CREATININE 0.88  CALCIUM 9.4    Recent Labs  04/05/14 2116  AST 19  ALT 19  ALKPHOS 52  BILITOT 0.3  PROT 7.3  ALBUMIN 3.9    Recent Labs  04/05/14 2116  WBC 6.7  NEUTROABS 3.8  HGB 14.5  HCT 41.0  MCV 98.1  PLT 201   Radiological Exams on Admission: Dg Chest Port 1 View  04/06/2014   CLINICAL DATA:  Chest pain  EXAM: PORTABLE CHEST - 1 VIEW  COMPARISON:  12/28/2013  FINDINGS: Mild cardiomegaly, normal upper mediastinal contours. Status post CABG. No acute infiltrate or edema. No effusion or pneumothorax. No acute osseous findings.  IMPRESSION: No active disease.   Electronically Signed   By: Tiburcio PeaJonathan  Watts M.D.   On: 04/06/2014 00:04    Assessment/Plan  48 yo male with [place] with h/o severe CAD and multiple risk factors  Principal Problem:   Unstable angina-  Trop neg.  ekg no acute changes.  obs on tele.  Romi.  Consider cards consult in am for further  w/u.  He needs to stop smoking.  ntg paste.  Active Problems:   HYPERLIPIDEMIA-MIXED   CAD, NATIVE VESSEL   Ischemic cardiomyopathy  obs tele.  Full code.  DAVID,RACHAL A 04/06/2014, 12:35 AM

## 2014-04-06 NOTE — Consult Note (Signed)
Primary cardiologist: Dr. Jonelle Sidle Consulting cardiologist: Dr. Jonelle Sidle  Clinical Summary [name] [name] is a 48 y.o.male with past medical history outlined below, currently admitted for observation following a more prolonged episode of angina. He states that he was out in the heat alot yesterday, had a longer episode of angina than usual, did not respond to typical nitroglycerin use. Under observation he has had no recurring chest pain, cardiac markers argue against ACS, ECG is nonacute. He otherwise reports compliance with his regular medical regimen.  Cardiac catheterization in March of this year showed severe native vessel CAD, occluded SVG to RCA, severely diseased SVG to ramus intermedius and obtuse marginal requiring DES in the proximal segment with cutting balloon angioplasty. Followup echocardiogram done in March of this year reported mild LVH with LVEF 55-60%, difficult to assess focal wall motion, no major valvular abnormalities described.   No Known Allergies  Medications Scheduled Medications: . aspirin EC  81 mg Oral Daily  . carvedilol  6.25 mg Oral BID WC  . clopidogrel  75 mg Oral QAC breakfast  . diazepam  5 mg Oral BID  . furosemide  20 mg Oral QHS  . isosorbide mononitrate  60 mg Oral BID  . lisinopril  2.5 mg Oral BID  . nitroGLYCERIN  0.5 inch Topical 3 times per day  . pantoprazole  40 mg Oral Daily  . ranolazine  500 mg Oral BID     PRN Medications: acetaminophen, albuterol, HYDROcodone-acetaminophen, nitroGLYCERIN, ondansetron (ZOFRAN) IV   Past Medical History  Diagnosis Date  . Coronary atherosclerosis of native coronary artery     a. CABG 2003. b. NSTEMI 2010 s/p BMS to SVG-RCA c. PTCA/stent/asp thromb to SVG-RCA 01/2010, PTCA to SVG-RCA 04/2010 d. known occlusion of SVG-RCA 2013. e. NSTEMI 07/2013  s/p PTCA/DES to SVG-diagonal/intermediate/OM1.   . Mixed hyperlipidemia   . Bipolar disorder   . GERD (gastroesophageal reflux  disease)   . Gout   . Chronic back pain   . Tobacco abuse   . Ischemic cardiomyopathy     a. EF 35-40% by cath 07/2013.  . Sinus bradycardia     Past Surgical History  Procedure Laterality Date  . Cholecystectomy    . Coronary artery bypass graft      2003, LIMA to LAD; SVG to diagonal; SVG to OM, ramus, and diagonal; SVG to RCA  . Pin placement left leg    . Metal plate to chin      Family History  Problem Relation Age of Onset  . Stroke      family Hx     Social History [name] [name] reports that he has been smoking Cigarettes.  He has a 26.4 pack-year smoking history. He has never used smokeless tobacco. [name] [name] reports that he does not drink alcohol.  Review of Systems As outlined above. Other systems reviewed and negative.  Physical Examination Blood pressure 120/70, pulse 55, temperature 98.6 F (37 C), temperature source Oral, resp. rate 16, height 5\' 4"  (1.626 m), weight 208 lb (94.348 kg), SpO2 98.00%.  Intake/Output Summary (Last 24 hours) at 04/06/14 0937 Last data filed at 04/06/14 0914  Gross per 24 hour  Intake    240 ml  Output      0 ml  Net    240 ml    Telemetry: Sinus rhythm.  Obese male, no distress. HEENT: Conjunctiva and lids normal, oropharynx clear with poor dentition. Neck: Supple, no elevated JVP or carotid bruits,  no thyromegaly. Lungs: Clear to auscultation, nonlabored breathing at rest. Cardiac: Regular rate and rhythm, no S3 systolic murmur, no pericardial rub. Abdomen: Soft, nontender, bowel sounds present, no guarding or rebound. Extremities: No pitting edema, distal pulses 2+. Skin: Warm and dry. Musculoskeletal: No kyphosis. Neuropsychiatric: Alert and oriented x3, affect grossly appropriate.   Lab Results  Basic Metabolic Panel:  Recent Labs Lab 04/05/14 2116  NA 136*  K 3.7  CL 97  CO2 27  GLUCOSE 130*  BUN 12  CREATININE 0.88  CALCIUM 9.4    Liver Function Tests:  Recent Labs Lab 04/05/14 2116  AST  19  ALT 19  ALKPHOS 52  BILITOT 0.3  PROT 7.3  ALBUMIN 3.9    CBC:  Recent Labs Lab 04/05/14 2116  WBC 6.7  NEUTROABS 3.8  HGB 14.5  HCT 41.0  MCV 98.1  PLT 201    Cardiac Enzymes:  Recent Labs Lab 04/06/14 0230 04/06/14 0752  TROPONINI <0.30 <0.30    ECG Sinus rhythm with nonspecific ST-T changes, probable old inferior infarct pattern.   Imaging PORTABLE CHEST - 1 VIEW  COMPARISON: 12/28/2013  FINDINGS: Mild cardiomegaly, normal upper mediastinal contours. Status post CABG. No acute infiltrate or edema. No effusion or pneumothorax. No acute osseous findings.  IMPRESSION: No active disease.   Impression  1. Episode of angina, currently resolved. More prolonged than typical, although he had been out in very high temperatures for most of the day. Cardiac markers are normal, arguing against ACS, ECG shows no acute changes.  2. Multivessel CAD status post CABG with subsequent bypass interventions including DES to the SVG to ramus intermedius with cutting balloon angioplasty earlier this year. LVEF is normal. He is on a good medical regimen.  3. Ongoing tobacco abuse, he is trying to quit smoking completely.  4. Hyperlipidemia, on statin therapy.  5. History of ischemic cardiomyopathy, most recently LVEF in normal range on medical therapy.   Recommendations  Discussed with patient. He would like to go home today. No additional cardiac testing being arranged at this point - can reconsider if symptoms escalate. Would continue medical therapy and observation, I will need to see him back in the next month in the [place] office.  [name] [name], M.D., F.A.C.C.

## 2014-04-06 NOTE — Discharge Summary (Signed)
Physician Discharge Summary  [name] [name] EXH:[id number] DOB: [birth year] DOA: 04/05/2014  PCP: Toma Deiters, MD  Admit date: 04/05/2014 Discharge date: 04/06/2014  Time spent: 40 minutes  Recommendations for Outpatient Follow-up:  1. Follow up with cardiology 06/08/14 for evaluation angina   Discharge Diagnoses:  Principal Problem:   Unstable angina Active Problems:   HYPERLIPIDEMIA-MIXED   CAD, NATIVE VESSEL   Ischemic cardiomyopathy   Chest pain, unspecified   Discharge Condition: stable  Diet recommendation: carb modified  Filed Weights   04/05/14 2104  Weight: 94.348 kg (208 lb)    History of present illness:  48 yo male h/o angina, CAD/CABG, last stenting 3 months prior, ICM ef 35% presents on 04/06/14 with sscp that lastrf for about 30 minutes after taking one of his ntg pills. He reported that he normally takes one ntg pill a day for his angina. It was not relieved with that, and it started radiating down his left arm briefly before he came to the ED. Pt denied any fevers. No cough. No n/vd. No swelling. He was smoking 5 PPD of cigerettes for many years, and is down to only 1.5 PPD of smoking in the last couple of months. He has quit smoking pot totally about 3 months prior.  Hospital Course:  Unstable angina-  Admitted to telemetry. Trop negative x3. Ekg no acute changes. No events on tele. He had no further chest pain. Cardiac cath 3/15 with sever native CAD. Echo 3/15 with mild LVH and EF 55%. Evaluated by cardiology who opine episode of angina that was more prolonged than typical that may be related to being out in heat. No further cardiac testing recommended. Continue medical therapy. Follow up 01/06/14 Active Problems:  HYPERLIPIDEMIA-MIXED: continue statin therapy  CAD, NATIVE VESSEL : s/p CABG with subsequent bypass interventions including angioplasty. LVEF 55%. Medical management  Ischemic cardiomyopathy: EF 55%. Medical  management     Procedures:  none  Consultations:  Dr. Diona Browner cardiology  Discharge Exam: Filed Vitals:   04/06/14 1013  BP: 107/66  Pulse: 61  Temp: 97.8 F (36.6 C)  Resp:     General: well nourished NAD Cardiovascular: RRR no MGR No LE edema Respiratory: normal effort BS clear bilaterally no crackles or wheeze  Discharge Instructions You were cared for by a hospitalist during your hospital stay. If you have any questions about your discharge medications or the care you received while you were in the hospital after you are discharged, you can call the unit and asked to speak with the hospitalist on call if the hospitalist that took care of you is not available. Once you are discharged, your primary care physician will handle any further medical issues. Please note that NO REFILLS for any discharge medications will be authorized once you are discharged, as it is imperative that you return to your primary care physician (or establish a relationship with a primary care physician if you do not have one) for your aftercare needs so that they can reassess your need for medications and monitor your lab values.  Discharge Instructions   Diet - low sodium heart healthy    Complete by:  As directed      Discharge instructions    Complete by:  As directed   Take medication as directed Follow up with cardiology in 1 month     Increase activity slowly    Complete by:  As directed             Medication List  albuterol 108 (90 BASE) MCG/ACT inhaler  Commonly known as:  PROVENTIL HFA;VENTOLIN HFA  Inhale 2 puffs into the lungs every 6 (six) hours as needed for wheezing or shortness of breath.     aspirin EC 81 MG tablet  Take 1 tablet (81 mg total) by mouth daily.     carvedilol 6.25 MG tablet  Commonly known as:  COREG  Take 1 tablet (6.25 mg total) by mouth 2 (two) times daily with a meal.     clopidogrel 75 MG tablet  Commonly known as:  PLAVIX  Take 1 tablet  (75 mg total) by mouth daily.     diazepam 5 MG tablet  Commonly known as:  VALIUM  Take 5 mg by mouth 2 (two) times daily.     Fish Oil 1000 MG Caps  Take 1,000 mg by mouth 2 (two) times daily.     furosemide 20 MG tablet  Commonly known as:  LASIX  Take 20 mg by mouth at bedtime.     HYDROcodone-acetaminophen 5-325 MG per tablet  Commonly known as:  NORCO/VICODIN  Take 1 tablet by mouth every 6 (six) hours as needed for moderate pain or severe pain.     isosorbide mononitrate 60 MG 24 hr tablet  Commonly known as:  IMDUR  Take 1 tablet (60 mg total) by mouth 2 (two) times daily.     lisinopril 2.5 MG tablet  Commonly known as:  PRINIVIL,ZESTRIL  Take 1 tablet (2.5 mg total) by mouth 2 (two) times daily.     nitroGLYCERIN 0.4 MG SL tablet  Commonly known as:  NITROSTAT  Place 0.4 mg under the tongue every 5 (five) minutes as needed for chest pain.     pantoprazole 40 MG tablet  Commonly known as:  PROTONIX  Take 40 mg by mouth daily.     pravastatin 80 MG tablet  Commonly known as:  PRAVACHOL  Take 1 tablet (80 mg total) by mouth every evening.     ranolazine 500 MG 12 hr tablet  Commonly known as:  RANEXA  Take 1 tablet (500 mg total) by mouth 2 (two) times daily.       No Known Allergies    The results of significant diagnostics from this hospitalization (including imaging, microbiology, ancillary and laboratory) are listed below for reference.    Significant Diagnostic Studies: Dg Chest Port 1 View  04/06/2014   CLINICAL DATA:  Chest pain  EXAM: PORTABLE CHEST - 1 VIEW  COMPARISON:  12/28/2013  FINDINGS: Mild cardiomegaly, normal upper mediastinal contours. Status post CABG. No acute infiltrate or edema. No effusion or pneumothorax. No acute osseous findings.  IMPRESSION: No active disease.   Electronically Signed   By: Tiburcio Pea M.D.   On: 04/06/2014 00:04    Microbiology: Recent Results (from the past 240 hour(s))  MRSA PCR SCREENING     Status:  None   Collection Time    04/06/14  2:34 AM      Result Value Ref Range Status   MRSA by PCR NEGATIVE  NEGATIVE Final   Comment:            The GeneXpert MRSA Assay (FDA     approved for NASAL specimens     only), is one component of a     comprehensive MRSA colonization     surveillance program. It is not     intended to diagnose MRSA     infection nor to guide or  monitor treatment for     MRSA infections.     Labs: Basic Metabolic Panel:  Recent Labs Lab 04/05/14 2116  NA 136*  K 3.7  CL 97  CO2 27  GLUCOSE 130*  BUN 12  CREATININE 0.88  CALCIUM 9.4   Liver Function Tests:  Recent Labs Lab 04/05/14 2116  AST 19  ALT 19  ALKPHOS 52  BILITOT 0.3  PROT 7.3  ALBUMIN 3.9   No results found for this basename: LIPASE, AMYLASE,  in the last 168 hours No results found for this basename: AMMONIA,  in the last 168 hours CBC:  Recent Labs Lab 04/05/14 2116  WBC 6.7  NEUTROABS 3.8  HGB 14.5  HCT 41.0  MCV 98.1  PLT 201   Cardiac Enzymes:  Recent Labs Lab 04/06/14 0230 04/06/14 0752  TROPONINI <0.30 <0.30   BNP: BNP (last 3 results)  Recent Labs  07/13/13 0518  PROBNP 182.5*   CBG: No results found for this basename: GLUCAP,  in the last 168 hours     Signed:  Gwenyth BenderBLACK,Sonda Coppens M  [facility] 04/06/2014, 11:19 AM

## 2014-04-06 NOTE — Progress Notes (Addendum)
AVS reviewed with patient.  Verbalized understanding of discharge instructions and physician follow-up.  Smoking education provided to patient.  Patient's IV removed.  Site WNL.  Patient refused wheelchair.  Reports all belongings intact and in possession at time of discharge.  Escorted by RN to main entrance for discharge.  Patient stable at time of discharge.

## 2014-05-05 ENCOUNTER — Ambulatory Visit: Payer: PRIVATE HEALTH INSURANCE | Admitting: Cardiology

## 2014-05-20 ENCOUNTER — Ambulatory Visit (INDEPENDENT_AMBULATORY_CARE_PROVIDER_SITE_OTHER): Payer: PRIVATE HEALTH INSURANCE | Admitting: Cardiology

## 2014-05-20 ENCOUNTER — Encounter: Payer: Self-pay | Admitting: Cardiology

## 2014-05-20 VITALS — BP 103/69 | HR 57 | Ht 64.0 in | Wt 212.0 lb

## 2014-05-20 DIAGNOSIS — I10 Essential (primary) hypertension: Secondary | ICD-10-CM

## 2014-05-20 DIAGNOSIS — I255 Ischemic cardiomyopathy: Secondary | ICD-10-CM

## 2014-05-20 DIAGNOSIS — I2589 Other forms of chronic ischemic heart disease: Secondary | ICD-10-CM

## 2014-05-20 DIAGNOSIS — I2510 Atherosclerotic heart disease of native coronary artery without angina pectoris: Secondary | ICD-10-CM

## 2014-05-20 DIAGNOSIS — I209 Angina pectoris, unspecified: Secondary | ICD-10-CM

## 2014-05-20 DIAGNOSIS — I25119 Atherosclerotic heart disease of native coronary artery with unspecified angina pectoris: Secondary | ICD-10-CM

## 2014-05-20 MED ORDER — NITROGLYCERIN 0.4 MG SL SUBL: 0.4000 mg | SUBLINGUAL_TABLET | SUBLINGUAL | Status: DC | PRN

## 2014-05-20 NOTE — Assessment & Plan Note (Signed)
Blood pressure is normal today. 

## 2014-05-20 NOTE — Progress Notes (Signed)
Clinical Summary [name] [name] is a 48 y.o.male last seen in May. He was seen recently as an inpatient consult in June following an episode of prolonged angina after being out of very high temperatures during the day. He ruled out for ACS by cardiac enzymes, and medical therapy was recommended.  We reviewed his symptoms. Main triggers for his angina are emotional upset or physical labor in the heat. We talked about some of the events he has had since hospital discharge, recently however he has been doing better. He needs a refill nitroglycerin.  Cardiac catheterization in March of this year showed severe native vessel CAD, occluded SVG to RCA, severely diseased SVG to ramus intermedius and obtuse marginal requiring DES in the proximal segment with cutting balloon angioplasty. Followup echocardiogram done in March of this year reported mild LVH with LVEF 55-60%, difficult to assess focal wall motion, no major valvular abnormalities described.    No Known Allergies  Current Outpatient Prescriptions  Medication Sig Dispense Refill  . albuterol (PROVENTIL HFA;VENTOLIN HFA) 108 (90 BASE) MCG/ACT inhaler Inhale 2 puffs into the lungs every 6 (six) hours as needed for wheezing or shortness of breath.      Marland Kitchen. aspirin EC 81 MG tablet Take 1 tablet (81 mg total) by mouth daily.      . carvedilol (COREG) 6.25 MG tablet Take 1 tablet (6.25 mg total) by mouth 2 (two) times daily with a meal.  60 tablet  6  . clopidogrel (PLAVIX) 75 MG tablet Take 1 tablet (75 mg total) by mouth daily.  30 tablet  11  . diazepam (VALIUM) 10 MG tablet Take 10 mg by mouth 2 (two) times daily.      . furosemide (LASIX) 20 MG tablet Take 20 mg by mouth at bedtime.      Marland Kitchen. HYDROcodone-acetaminophen (NORCO/VICODIN) 5-325 MG per tablet Take 1 tablet by mouth every 6 (six) hours as needed for moderate pain or severe pain.       . isosorbide mononitrate (IMDUR) 60 MG 24 hr tablet Take 1 tablet (60 mg total) by mouth 2 (two) times  daily.  180 tablet  3  . lisinopril (PRINIVIL,ZESTRIL) 2.5 MG tablet Take 1 tablet (2.5 mg total) by mouth 2 (two) times daily.  180 tablet  3  . nitroGLYCERIN (NITROSTAT) 0.4 MG SL tablet Place 1 tablet (0.4 mg total) under the tongue every 5 (five) minutes x 3 doses as needed for chest pain.  25 tablet  3  . Omega-3 Fatty Acids (FISH OIL) 1000 MG CAPS Take 1,000 mg by mouth 2 (two) times daily.      . pantoprazole (PROTONIX) 40 MG tablet Take 40 mg by mouth daily.       . pravastatin (PRAVACHOL) 80 MG tablet Take 1 tablet (80 mg total) by mouth every evening.  90 tablet  3  . ranolazine (RANEXA) 500 MG 12 hr tablet Take 1 tablet (500 mg total) by mouth 2 (two) times daily.  180 tablet  3   No current facility-administered medications for this visit.    Past Medical History  Diagnosis Date  . Coronary atherosclerosis of native coronary artery     a. CABG 2003. b. NSTEMI 2010 s/p BMS to SVG-RCA c. PTCA/stent/asp thromb to SVG-RCA 01/2010, PTCA to SVG-RCA 04/2010 d. known occlusion of SVG-RCA 2013. e. NSTEMI 07/2013  s/p PTCA/DES to SVG-diagonal/intermediate/OM1.   . Mixed hyperlipidemia   . Bipolar disorder   . GERD (gastroesophageal reflux disease)   .  Gout   . Chronic back pain   . Tobacco abuse   . Ischemic cardiomyopathy     a. EF 35-40% by cath 07/2013.  . Sinus bradycardia     Past Surgical History  Procedure Laterality Date  . Cholecystectomy    . Coronary artery bypass graft      2003, LIMA to LAD; SVG to diagonal; SVG to OM, ramus, and diagonal; SVG to RCA  . Pin placement left leg    . Metal plate to chin      Social History [name] [name] that he has been smoking Cigarettes.  He has a 26.4 pack-year smoking history. He has never used smokeless tobacco. [name] [name] that he does not drink alcohol.  Review of Systems Other systems reviewed and negative.  Physical Examination Filed Vitals:   05/20/14 1139  BP: 103/69  Pulse: 57   Filed Weights    05/20/14 1139  Weight: 212 lb (96.163 kg)    Overweight male Comfortable at rest.  HEENT: Conjunctiva and lids normal, oropharynx clear.  Neck: Supple, no elevated JVP or carotid bruits, no thyromegaly.  Lungs: Clear to auscultation, nonlabored breathing at rest.  Cardiac: Regular rate and rhythm, no S3, no pericardial rub.  Abdomen: Soft, nontender, bowel sounds present.  Extremities: No pitting edema, distal pulses 2+.    Problem List and Plan   CAD, NATIVE VESSEL Multivessel disease with graft disease and status post recent DES to the SVG to ramus intermedius and obtuse marginal system. He has relatively stable angina symptoms with emotional upset and exertion in the heat. We discussed trying to avoid the high temperatures of the day, provided a refill for sublingual nitroglycerin. He otherwise [name] compliance with medications. We continue to discuss smoking cessation which is absolutely critical.  Ischemic cardiomyopathy Fortunately, LVEF improved to the range of 55-60% by recent echocardiogram.  Essential hypertension, benign Blood pressure is normal today.    Jonelle Sidle, M.D., F.A.C.C.

## 2014-05-20 NOTE — Assessment & Plan Note (Signed)
Fortunately, LVEF improved to the range of 55-60% by recent echocardiogram.

## 2014-05-20 NOTE — Patient Instructions (Signed)
Your physician recommends that you schedule a follow-up appointment in: 4 months. You will receive a reminder letter in the mail in about 1-2 months reminding you to call and schedule your appointment. If you don't receive this letter, please contact our office. Your physician recommends that you continue on your current medications as directed. Please refer to the Current Medication list given to you today. 

## 2014-05-20 NOTE — Assessment & Plan Note (Signed)
Multivessel disease with graft disease and status post recent DES to the SVG to ramus intermedius and obtuse marginal system. He has relatively stable angina symptoms with emotional upset and exertion in the heat. We discussed trying to avoid the high temperatures of the day, provided a refill for sublingual nitroglycerin. He otherwise reports compliance with medications. We continue to discuss smoking cessation which is absolutely critical.

## 2014-06-08 ENCOUNTER — Ambulatory Visit: Payer: PRIVATE HEALTH INSURANCE | Admitting: Cardiology

## 2014-06-09 ENCOUNTER — Telehealth: Payer: Self-pay | Admitting: Cardiology

## 2014-06-09 ENCOUNTER — Encounter: Payer: Self-pay | Admitting: *Deleted

## 2014-06-09 DIAGNOSIS — I2510 Atherosclerotic heart disease of native coronary artery without angina pectoris: Secondary | ICD-10-CM

## 2014-06-09 DIAGNOSIS — I255 Ischemic cardiomyopathy: Secondary | ICD-10-CM

## 2014-06-09 NOTE — Telephone Encounter (Signed)
Patient asked for you to call around 3:15-3:30  Due to his wife sleeping  Patient having to take nitro a lot more lately with any exertion. Call this his spells. Said that he spoke with Dr Diona Browner last visit about this and they may need to adjust his medications?

## 2014-06-09 NOTE — Telephone Encounter (Signed)
I saw him recently on August 12. His medical therapy is already fairly optimal in terms of antianginal effect, and his blood pressure and heart rate were quite good at that visit. He may be experiencing more angina symptoms, would recommend scheduling a Lexiscan Cardiolite on medical therapy to reassess ischemic burden, and then have him come back to see me in the office to review the results. We may be looking at another heart catheterization if there is significant ischemia to determine if there are any revascularization options.

## 2014-06-09 NOTE — Telephone Encounter (Signed)
Patient informed and verbalized understanding of plan. 

## 2014-06-09 NOTE — Telephone Encounter (Signed)
Patient said he is having more discomfort in his chest is using his nitroglycerin more frequently. Patient said this sometimes happens when he's trying to feed the dogs. Patient also said he woke up with his heart racing and chest pain. Patient also c/o some dizziness. Patient said he was informed to call the office if his symptoms got worse.

## 2014-06-17 ENCOUNTER — Encounter (HOSPITAL_COMMUNITY)
Admission: RE | Admit: 2014-06-17 | Discharge: 2014-06-17 | Disposition: A | Discharge status: Home / Self Care | Payer: PRIVATE HEALTH INSURANCE | Source: Ambulatory Visit | Attending: Cardiology | Admitting: Cardiology

## 2014-06-17 ENCOUNTER — Telehealth: Payer: Self-pay | Admitting: *Deleted

## 2014-06-17 ENCOUNTER — Other Ambulatory Visit: Payer: Self-pay | Admitting: *Deleted

## 2014-06-17 ENCOUNTER — Ambulatory Visit (HOSPITAL_COMMUNITY)
Admission: RE | Admit: 2014-06-17 | Discharge: 2014-06-17 | Disposition: A | Discharge status: Home / Self Care | Payer: PRIVATE HEALTH INSURANCE | Source: Ambulatory Visit | Attending: Cardiology | Admitting: Cardiology

## 2014-06-17 ENCOUNTER — Encounter (HOSPITAL_COMMUNITY): Payer: Self-pay

## 2014-06-17 DIAGNOSIS — F17200 Nicotine dependence, unspecified, uncomplicated: Secondary | ICD-10-CM | POA: Insufficient documentation

## 2014-06-17 DIAGNOSIS — I5189 Other ill-defined heart diseases: Secondary | ICD-10-CM | POA: Insufficient documentation

## 2014-06-17 DIAGNOSIS — I428 Other cardiomyopathies: Secondary | ICD-10-CM | POA: Diagnosis not present

## 2014-06-17 DIAGNOSIS — I2510 Atherosclerotic heart disease of native coronary artery without angina pectoris: Secondary | ICD-10-CM | POA: Insufficient documentation

## 2014-06-17 DIAGNOSIS — R079 Chest pain, unspecified: Secondary | ICD-10-CM | POA: Insufficient documentation

## 2014-06-17 DIAGNOSIS — I255 Ischemic cardiomyopathy: Secondary | ICD-10-CM

## 2014-06-17 DIAGNOSIS — E785 Hyperlipidemia, unspecified: Secondary | ICD-10-CM | POA: Insufficient documentation

## 2014-06-17 DIAGNOSIS — Z951 Presence of aortocoronary bypass graft: Secondary | ICD-10-CM | POA: Diagnosis not present

## 2014-06-17 DIAGNOSIS — K219 Gastro-esophageal reflux disease without esophagitis: Secondary | ICD-10-CM | POA: Insufficient documentation

## 2014-06-17 HISTORY — DX: Unspecified asthma, uncomplicated: J45.909

## 2014-06-17 MED ORDER — SODIUM CHLORIDE 0.9 % IJ SOLN
10.0000 mL | INTRAMUSCULAR | Status: DC | PRN
Start: 2014-06-17 — End: 2014-06-18
  Administered 2014-06-17: 10 mL via INTRAVENOUS

## 2014-06-17 MED ORDER — CLOPIDOGREL BISULFATE 75 MG PO TABS: 75.0000 mg | ORAL_TABLET | Freq: Every day | ORAL | Status: DC

## 2014-06-17 MED ORDER — TECHNETIUM TC 99M SESTAMIBI GENERIC - CARDIOLITE
30.0000 | Freq: Once | INTRAVENOUS | Status: AC | PRN
Start: 2014-06-17 — End: 2014-06-17
  Administered 2014-06-17: 30 via INTRAVENOUS

## 2014-06-17 MED ORDER — TECHNETIUM TC 99M SESTAMIBI - CARDIOLITE
10.0000 | Freq: Once | INTRAVENOUS | Status: AC | PRN
  Administered 2014-06-17: 09:00:00 10 via INTRAVENOUS

## 2014-06-17 MED ORDER — SODIUM CHLORIDE 0.9 % IJ SOLN
INTRAMUSCULAR | Status: AC
  Administered 2014-06-17: 10 mL via INTRAVENOUS
  Filled 2014-06-17: qty 10

## 2014-06-17 MED ORDER — REGADENOSON 0.4 MG/5ML IV SOLN
0.4000 mg | Freq: Once | INTRAVENOUS | Status: AC | PRN
  Administered 2014-06-17: 0.4 mg via INTRAVENOUS

## 2014-06-17 MED ORDER — REGADENOSON 0.4 MG/5ML IV SOLN
INTRAVENOUS | Status: AC
  Administered 2014-06-17: 0.4 mg via INTRAVENOUS
  Filled 2014-06-17: qty 5

## 2014-06-17 NOTE — Telephone Encounter (Signed)
Message copied by Eustace Moore on Wed Jun 17, 2014  2:15 PM ------      Message from: MCDOWELL, Illene Bolus      Created: Wed Jun 17, 2014  2:06 PM       Reviewed. Stress test is abnormal as detailed above. Although consistent with his history of ischemic heart disease, region of ischemia could indicate restenosis following most recent vein graft intervention. In light of his chest pain symptoms, we see if he can come in to see me in the [place] office tomorrow afternoon in to discuss a heart catheterization. ------

## 2014-06-17 NOTE — Progress Notes (Signed)
Stress Lab Nurses Notes - [name] [name] 06/17/2014 Reason for doing test: CAD and Chest Pain Type of test: Eugenie Birks Cardioilte Nurse performing test: Parke Poisson, RN Nuclear Medicine Tech: Lyndel Pleasure Echo Tech: Not Applicable MD performing test: S. McDowell/M.Geni Bers PA Family MD: Hasamaj Test explained and consent signed: Yes.   IV started: Saline lock flushed, No redness or edema and Saline lock started in radiology Symptoms: chest pain # 8, left elbow & chest tightness Treatment/Intervention: None Reason test stopped: protocol completed After recovery IV was: Discontinued via X-ray tech and No redness or edema Patient to return to Nuc. Med at : 11:30 Patient discharged: Home Patient's Condition upon discharge was: stable Comments: During test BP 92/63 & HR 83.   Recovery BP 118/80 & HR 58. Continue to have chest tightness # 4 .  [name] T

## 2014-06-17 NOTE — Telephone Encounter (Signed)
Patient informed. 

## 2014-06-17 NOTE — Telephone Encounter (Signed)
Message left on vm informing him of appointment for tomorrow.

## 2014-06-18 ENCOUNTER — Ambulatory Visit (INDEPENDENT_AMBULATORY_CARE_PROVIDER_SITE_OTHER): Payer: PRIVATE HEALTH INSURANCE | Admitting: Cardiology

## 2014-06-18 ENCOUNTER — Other Ambulatory Visit: Payer: Self-pay | Admitting: Cardiology

## 2014-06-18 ENCOUNTER — Ambulatory Visit (HOSPITAL_COMMUNITY): Payer: PRIVATE HEALTH INSURANCE

## 2014-06-18 ENCOUNTER — Encounter: Payer: Self-pay | Admitting: Cardiology

## 2014-06-18 ENCOUNTER — Encounter (HOSPITAL_COMMUNITY): Payer: Self-pay

## 2014-06-18 ENCOUNTER — Encounter (HOSPITAL_COMMUNITY): Payer: PRIVATE HEALTH INSURANCE

## 2014-06-18 VITALS — BP 102/78 | HR 75 | Ht 64.0 in | Wt 207.0 lb

## 2014-06-18 DIAGNOSIS — I2589 Other forms of chronic ischemic heart disease: Secondary | ICD-10-CM

## 2014-06-18 DIAGNOSIS — I25110 Atherosclerotic heart disease of native coronary artery with unstable angina pectoris: Secondary | ICD-10-CM

## 2014-06-18 DIAGNOSIS — I200 Unstable angina: Secondary | ICD-10-CM

## 2014-06-18 DIAGNOSIS — E785 Hyperlipidemia, unspecified: Secondary | ICD-10-CM

## 2014-06-18 DIAGNOSIS — I2510 Atherosclerotic heart disease of native coronary artery without angina pectoris: Secondary | ICD-10-CM

## 2014-06-18 DIAGNOSIS — I255 Ischemic cardiomyopathy: Secondary | ICD-10-CM

## 2014-06-18 DIAGNOSIS — F17200 Nicotine dependence, unspecified, uncomplicated: Secondary | ICD-10-CM

## 2014-06-18 DIAGNOSIS — R9439 Abnormal result of other cardiovascular function study: Secondary | ICD-10-CM | POA: Insufficient documentation

## 2014-06-18 NOTE — Assessment & Plan Note (Signed)
LVEF approximately 40%.

## 2014-06-18 NOTE — Assessment & Plan Note (Signed)
Intermediate risk study with evidence of scar and mild to moderate peri-infarct ischemia in the inferolateral wall. Last intervention was to vein graft to the ramus intermedius and obtuse marginal system, he has known occlusion of the vein graft to the RCA. In light of accelerating angina symptoms on good medical therapy, he is being referred back for diagnostic heart catheterization to see [place] there are any remaining revascularization options that might help his symptoms.

## 2014-06-18 NOTE — Assessment & Plan Note (Signed)
He continues on Pravachol. 

## 2014-06-18 NOTE — Assessment & Plan Note (Signed)
Ongoing, he has not been able to quit. We continue to discuss the critical importance to his health of complete smoking cessation.

## 2014-06-18 NOTE — Assessment & Plan Note (Signed)
Severe native vessel disease status post bypass surgery with documented graft disease as well.

## 2014-06-18 NOTE — Patient Instructions (Signed)
Your physician recommends that you schedule a follow-up appointment in: to be scheduled after cath    Your physician has requested that you have a cardiac catheterization. Cardiac catheterization is used to diagnose and/or treat various heart conditions. Doctors may recommend this procedure for a number of different reasons. The most common reason is to evaluate chest pain. Chest pain can be a symptom of coronary artery disease (CAD), and cardiac catheterization can show whether plaque is narrowing or blocking your heart's arteries. This procedure is also used to evaluate the valves, as well as measure the blood flow and oxygen levels in different parts of your heart. For further information please visit [email or web address] Please follow instruction sheet, as given.       Thank you for choosing [facility] !

## 2014-06-18 NOTE — Progress Notes (Signed)
Clinical Summary Mr. Lafon is a 48 y.o.male last seen in August. Since that time he has reported increasing episodes of angina despite good medical therapy, and reported compliance with his medications. He was referred for a followup Lexiscan Cardiolite which was overall intermediate risk showing a region of inferolateral scar with mild to moderate peri-infarct ischemia, inferior hypokinesis, and LVEF of 40%. He is here with his wife today to review the results. He tells me that he continues to have regular angina symptoms, has already almost gone through a complete bottle of nitroglycerin just since the beginning of this month. He has had clearly accelerating angina symptoms since I last saw him.  Cardiac catheterization in March of this year showed severe native vessel CAD, occluded SVG to RCA, severely diseased SVG to ramus intermedius and obtuse marginal requiring DES in the proximal segment with cutting balloon angioplasty.   Difficult situation. He has severe native vessel and graft disease as outlined, has not been able to quit smoking cigarettes which negatively impacts his health as well. His blood pressure and heart rate are well controlled on a broad medical regimen. We have discussed referral for a followup diagnostic heart catheterization to see if there any remaining revascularization options that might help to improve his symptoms. He is in agreement to proceed.   No Known Allergies  Current Outpatient Prescriptions  Medication Sig Dispense Refill  . albuterol (PROVENTIL HFA;VENTOLIN HFA) 108 (90 BASE) MCG/ACT inhaler Inhale 2 puffs into the lungs every 6 (six) hours as needed for wheezing or shortness of breath.      Marland Kitchen aspirin EC 81 MG tablet Take 1 tablet (81 mg total) by mouth daily.      . carvedilol (COREG) 6.25 MG tablet Take 1 tablet (6.25 mg total) by mouth 2 (two) times daily with a meal.  60 tablet  6  . clopidogrel (PLAVIX) 75 MG tablet Take 1 tablet (75 mg total) by  mouth daily.  30 tablet  11  . diazepam (VALIUM) 10 MG tablet Take 10 mg by mouth 2 (two) times daily.      . furosemide (LASIX) 20 MG tablet Take 20 mg by mouth at bedtime.      Marland Kitchen HYDROcodone-acetaminophen (NORCO) 7.5-325 MG per tablet Take 1 tablet by mouth every 6 (six) hours as needed for moderate pain.      . isosorbide mononitrate (IMDUR) 60 MG 24 hr tablet Take 1 tablet (60 mg total) by mouth 2 (two) times daily.  180 tablet  3  . lisinopril (PRINIVIL,ZESTRIL) 2.5 MG tablet Take 1 tablet (2.5 mg total) by mouth 2 (two) times daily.  180 tablet  3  . nitroGLYCERIN (NITROSTAT) 0.4 MG SL tablet Place 1 tablet (0.4 mg total) under the tongue every 5 (five) minutes x 3 doses as needed for chest pain.  25 tablet  3  . Omega-3 Fatty Acids (FISH OIL) 1000 MG CAPS Take 1,000 mg by mouth 2 (two) times daily.      . pantoprazole (PROTONIX) 40 MG tablet Take 40 mg by mouth daily.       . pravastatin (PRAVACHOL) 80 MG tablet Take 1 tablet (80 mg total) by mouth every evening.  90 tablet  3  . ranolazine (RANEXA) 500 MG 12 hr tablet Take 1 tablet (500 mg total) by mouth 2 (two) times daily.  180 tablet  3   No current facility-administered medications for this visit.    Past Medical History  Diagnosis Date  .  Coronary atherosclerosis of native coronary artery     a. CABG 2003. b. NSTEMI 2010 s/p BMS to SVG-RCA c. PTCA/stent/asp thromb to SVG-RCA 01/2010, PTCA to SVG-RCA 04/2010 d. known occlusion of SVG-RCA 2013. e. NSTEMI 07/2013  s/p PTCA/DES to SVG-diagonal/intermediate/OM1.   . Mixed hyperlipidemia   . Bipolar disorder   . GERD (gastroesophageal reflux disease)   . Gout   . Chronic back pain   . Tobacco abuse   . Ischemic cardiomyopathy     a. EF 35-40% by cath 07/2013.  . Sinus bradycardia   . CHF (congestive heart failure)   . Hypertension   . Asthma     Past Surgical History  Procedure Laterality Date  . Cholecystectomy    . Coronary artery bypass graft      2003, LIMA to LAD; SVG  to diagonal; SVG to OM, ramus, and diagonal; SVG to RCA  . Pin placement left leg    . Metal plate to chin      Family History  Problem Relation Age of Onset  . Stroke      family Hx     Social History Mr. Gatta reports that he has been smoking Cigarettes.  He has a 26.4 pack-year smoking history. He has never used smokeless tobacco. Mr. Mi reports that he does not drink alcohol.  Review of Systems No palpitations or syncope. NYHA class 2-3 shortness of breath. No active bleeding problems. Other systems reviewed and negative except as outlined.  Physical Examination Filed Vitals:   06/18/14 1338  BP: 102/78  Pulse: 75   Filed Weights   06/18/14 1338  Weight: 207 lb (93.895 kg)    Overweight male no distress.  HEENT: Conjunctiva and lids normal, oropharynx clear with poor dentition. Neck: Supple, no elevated JVP or carotid bruits, no thyromegaly.  Lungs: Clear to auscultation, nonlabored breathing at rest.  Cardiac: Regular rate and rhythm, no S3, no pericardial rub.  Abdomen: Soft, nontender, bowel sounds present.  Extremities: No pitting edema, distal pulses 2+.  Skin: Warm and dry with scattered tattoos. Musculoskeletal: No kyphosis. Neuropsychiatric: Alert and oriented x3, affect flat.   Problem List and Plan   Abnormal myocardial perfusion study Intermediate risk study with evidence of scar and mild to moderate peri-infarct ischemia in the inferolateral wall. Last intervention was to vein graft to the ramus intermedius and obtuse marginal system, he has known occlusion of the vein graft to the RCA. In light of accelerating angina symptoms on good medical therapy, he is being referred back for diagnostic heart catheterization to see [place] there are any remaining revascularization options that might help his symptoms.  CAD, NATIVE VESSEL Severe native vessel disease status post bypass surgery with documented graft disease as well.  Ischemic cardiomyopathy LVEF  approximately 40%.  TOBACCO USER Ongoing, he has not been able to quit. We continue to discuss the critical importance to his health of complete smoking cessation.  HYPERLIPIDEMIA-MIXED He continues on Pravachol.    Jonelle Sidle, M.D., F.A.C.C.

## 2014-06-19 ENCOUNTER — Encounter (HOSPITAL_COMMUNITY)
Admission: RE | Disposition: A | Discharge status: Home / Self Care | Payer: PRIVATE HEALTH INSURANCE | Source: Ambulatory Visit | Attending: Cardiology

## 2014-06-19 ENCOUNTER — Ambulatory Visit (HOSPITAL_COMMUNITY)
Admission: RE | Admit: 2014-06-19 | Discharge: 2014-06-20 | Disposition: A | Discharge status: Home / Self Care | Payer: PRIVATE HEALTH INSURANCE | Source: Ambulatory Visit | Attending: Cardiology | Admitting: Cardiology

## 2014-06-19 DIAGNOSIS — E782 Mixed hyperlipidemia: Secondary | ICD-10-CM | POA: Diagnosis not present

## 2014-06-19 DIAGNOSIS — I498 Other specified cardiac arrhythmias: Secondary | ICD-10-CM | POA: Diagnosis not present

## 2014-06-19 DIAGNOSIS — K219 Gastro-esophageal reflux disease without esophagitis: Secondary | ICD-10-CM | POA: Insufficient documentation

## 2014-06-19 DIAGNOSIS — E785 Hyperlipidemia, unspecified: Secondary | ICD-10-CM

## 2014-06-19 DIAGNOSIS — Y849 Medical procedure, unspecified as the cause of abnormal reaction of the patient, or of later complication, without mention of misadventure at the time of the procedure: Secondary | ICD-10-CM | POA: Diagnosis not present

## 2014-06-19 DIAGNOSIS — I25700 Atherosclerosis of coronary artery bypass graft(s), unspecified, with unstable angina pectoris: Secondary | ICD-10-CM

## 2014-06-19 DIAGNOSIS — Z6835 Body mass index (BMI) 35.0-35.9, adult: Secondary | ICD-10-CM | POA: Insufficient documentation

## 2014-06-19 DIAGNOSIS — I209 Angina pectoris, unspecified: Secondary | ICD-10-CM | POA: Diagnosis present

## 2014-06-19 DIAGNOSIS — I25810 Atherosclerosis of coronary artery bypass graft(s) without angina pectoris: Secondary | ICD-10-CM

## 2014-06-19 DIAGNOSIS — J45909 Unspecified asthma, uncomplicated: Secondary | ICD-10-CM | POA: Diagnosis not present

## 2014-06-19 DIAGNOSIS — I200 Unstable angina: Secondary | ICD-10-CM | POA: Diagnosis not present

## 2014-06-19 DIAGNOSIS — M109 Gout, unspecified: Secondary | ICD-10-CM | POA: Insufficient documentation

## 2014-06-19 DIAGNOSIS — Z7982 Long term (current) use of aspirin: Secondary | ICD-10-CM | POA: Insufficient documentation

## 2014-06-19 DIAGNOSIS — I252 Old myocardial infarction: Secondary | ICD-10-CM | POA: Diagnosis not present

## 2014-06-19 DIAGNOSIS — E663 Overweight: Secondary | ICD-10-CM | POA: Diagnosis not present

## 2014-06-19 DIAGNOSIS — I10 Essential (primary) hypertension: Secondary | ICD-10-CM | POA: Diagnosis present

## 2014-06-19 DIAGNOSIS — I509 Heart failure, unspecified: Secondary | ICD-10-CM | POA: Diagnosis not present

## 2014-06-19 DIAGNOSIS — I25119 Atherosclerotic heart disease of native coronary artery with unspecified angina pectoris: Secondary | ICD-10-CM

## 2014-06-19 DIAGNOSIS — F17200 Nicotine dependence, unspecified, uncomplicated: Secondary | ICD-10-CM | POA: Diagnosis present

## 2014-06-19 DIAGNOSIS — Z9861 Coronary angioplasty status: Secondary | ICD-10-CM | POA: Diagnosis not present

## 2014-06-19 DIAGNOSIS — I2589 Other forms of chronic ischemic heart disease: Secondary | ICD-10-CM | POA: Diagnosis not present

## 2014-06-19 DIAGNOSIS — R001 Bradycardia, unspecified: Secondary | ICD-10-CM | POA: Diagnosis present

## 2014-06-19 DIAGNOSIS — F319 Bipolar disorder, unspecified: Secondary | ICD-10-CM | POA: Insufficient documentation

## 2014-06-19 DIAGNOSIS — Z7902 Long term (current) use of antithrombotics/antiplatelets: Secondary | ICD-10-CM | POA: Diagnosis not present

## 2014-06-19 DIAGNOSIS — R9439 Abnormal result of other cardiovascular function study: Secondary | ICD-10-CM | POA: Diagnosis present

## 2014-06-19 DIAGNOSIS — I2510 Atherosclerotic heart disease of native coronary artery without angina pectoris: Secondary | ICD-10-CM | POA: Diagnosis present

## 2014-06-19 DIAGNOSIS — I255 Ischemic cardiomyopathy: Secondary | ICD-10-CM | POA: Diagnosis present

## 2014-06-19 HISTORY — PX: LEFT HEART CATHETERIZATION WITH CORONARY/GRAFT ANGIOGRAM: SHX5450

## 2014-06-19 HISTORY — PX: CARDIAC CATHETERIZATION: SHX172

## 2014-06-19 LAB — POCT ACTIVATED CLOTTING TIME
ACTIVATED CLOTTING TIME: 253 s
Activated Clotting Time: 242 seconds

## 2014-06-19 LAB — BASIC METABOLIC PANEL
Anion gap: 12 (ref 5–15)
BUN: 10 mg/dL (ref 6–23)
CO2: 28 mEq/L (ref 19–32)
Calcium: 9 mg/dL (ref 8.4–10.5)
Chloride: 98 mEq/L (ref 96–112)
Creatinine, Ser: 0.79 mg/dL (ref 0.50–1.35)
Glucose, Bld: 92 mg/dL (ref 70–99)
Potassium: 4.9 mEq/L (ref 3.7–5.3)
SODIUM: 138 meq/L (ref 137–147)

## 2014-06-19 LAB — PROTIME-INR
INR: 1.01 (ref 0.00–1.49)
PROTHROMBIN TIME: 13.3 s (ref 11.6–15.2)

## 2014-06-19 LAB — CBC
HCT: 40.3 % (ref 39.0–52.0)
Hemoglobin: 13.9 g/dL (ref 13.0–17.0)
MCH: 34.2 pg — AB (ref 26.0–34.0)
MCHC: 34.5 g/dL (ref 30.0–36.0)
MCV: 99 fL (ref 78.0–100.0)
PLATELETS: 189 10*3/uL (ref 150–400)
RBC: 4.07 MIL/uL — AB (ref 4.22–5.81)
RDW: 13.1 % (ref 11.5–15.5)
WBC: 5.5 10*3/uL (ref 4.0–10.5)

## 2014-06-19 LAB — GLUCOSE, CAPILLARY: GLUCOSE-CAPILLARY: 89 mg/dL (ref 70–99)

## 2014-06-19 LAB — MRSA PCR SCREENING: MRSA by PCR: NEGATIVE

## 2014-06-19 SURGERY — LEFT HEART CATHETERIZATION WITH CORONARY/GRAFT ANGIOGRAM
Anesthesia: LOCAL

## 2014-06-19 MED ORDER — SODIUM CHLORIDE 0.9 % IV SOLN
INTRAVENOUS | Status: DC
  Administered 2014-06-19: 11:00:00 via INTRAVENOUS

## 2014-06-19 MED ORDER — NITROGLYCERIN 1 MG/10 ML FOR IR/CATH LAB
INTRA_ARTERIAL | Status: AC
  Filled 2014-06-19: qty 10

## 2014-06-19 MED ORDER — SODIUM CHLORIDE 0.9 % IV SOLN: 250.0000 mL | INTRAVENOUS | Status: DC | PRN

## 2014-06-19 MED ORDER — SODIUM CHLORIDE 0.9 % IJ SOLN: 3.0000 mL | INTRAMUSCULAR | Status: DC | PRN

## 2014-06-19 MED ORDER — SODIUM CHLORIDE 0.9 % IV SOLN: 1.0000 mL/kg/h | INTRAVENOUS | Status: AC

## 2014-06-19 MED ORDER — NITROGLYCERIN 0.4 MG SL SUBL: 0.4000 mg | SUBLINGUAL_TABLET | SUBLINGUAL | Status: DC | PRN

## 2014-06-19 MED ORDER — FUROSEMIDE 20 MG PO TABS
20.0000 mg | ORAL_TABLET | Freq: Every day | ORAL | Status: DC
  Filled 2014-06-19 (×2): qty 1

## 2014-06-19 MED ORDER — DIAZEPAM 5 MG PO TABS: 10.0000 mg | ORAL_TABLET | Freq: Two times a day (BID) | ORAL | Status: DC | PRN

## 2014-06-19 MED ORDER — CLOPIDOGREL BISULFATE 300 MG PO TABS
ORAL_TABLET | ORAL | Status: AC
  Filled 2014-06-19: qty 1

## 2014-06-19 MED ORDER — PANTOPRAZOLE SODIUM 40 MG PO TBEC
40.0000 mg | DELAYED_RELEASE_TABLET | Freq: Every day | ORAL | Status: DC
  Administered 2014-06-20: 11:00:00 40 mg via ORAL
  Filled 2014-06-19: qty 1

## 2014-06-19 MED ORDER — SODIUM CHLORIDE 0.9 % IJ SOLN: 3.0000 mL | Freq: Two times a day (BID) | INTRAMUSCULAR | Status: DC

## 2014-06-19 MED ORDER — CARVEDILOL 6.25 MG PO TABS
6.2500 mg | ORAL_TABLET | Freq: Two times a day (BID) | ORAL | Status: DC
  Administered 2014-06-20: 12:00:00 6.25 mg via ORAL
  Filled 2014-06-19 (×2): qty 1
  Filled 2014-06-19: qty 2
  Filled 2014-06-19 (×2): qty 1

## 2014-06-19 MED ORDER — CLOPIDOGREL BISULFATE 75 MG PO TABS
75.0000 mg | ORAL_TABLET | Freq: Every day | ORAL | Status: DC
  Administered 2014-06-20: 11:00:00 75 mg via ORAL
  Filled 2014-06-19: qty 1

## 2014-06-19 MED ORDER — HEPARIN SODIUM (PORCINE) 1000 UNIT/ML IJ SOLN
INTRAMUSCULAR | Status: AC
  Filled 2014-06-19: qty 1

## 2014-06-19 MED ORDER — HEPARIN (PORCINE) IN NACL 2-0.9 UNIT/ML-% IJ SOLN
INTRAMUSCULAR | Status: AC
  Filled 2014-06-19: qty 500

## 2014-06-19 MED ORDER — VERAPAMIL HCL 2.5 MG/ML IV SOLN
INTRAVENOUS | Status: AC
  Filled 2014-06-19: qty 2

## 2014-06-19 MED ORDER — FENTANYL CITRATE 0.05 MG/ML IJ SOLN
INTRAMUSCULAR | Status: AC
  Filled 2014-06-19: qty 2

## 2014-06-19 MED ORDER — ASPIRIN EC 81 MG PO TBEC
81.0000 mg | DELAYED_RELEASE_TABLET | Freq: Every day | ORAL | Status: DC
  Administered 2014-06-20: 81 mg via ORAL
  Filled 2014-06-19: qty 1

## 2014-06-19 MED ORDER — ONDANSETRON HCL 4 MG/2ML IJ SOLN: 4.0000 mg | Freq: Four times a day (QID) | INTRAMUSCULAR | Status: DC | PRN

## 2014-06-19 MED ORDER — LISINOPRIL 2.5 MG PO TABS
2.5000 mg | ORAL_TABLET | Freq: Two times a day (BID) | ORAL | Status: DC
  Administered 2014-06-20: 11:00:00 2.5 mg via ORAL
  Filled 2014-06-19 (×3): qty 1

## 2014-06-19 MED ORDER — HYDROCODONE-ACETAMINOPHEN 7.5-325 MG PO TABS: 1.0000 | ORAL_TABLET | Freq: Four times a day (QID) | ORAL | Status: DC | PRN

## 2014-06-19 MED ORDER — RANOLAZINE ER 500 MG PO TB12
500.0000 mg | ORAL_TABLET | Freq: Two times a day (BID) | ORAL | Status: DC
  Administered 2014-06-19 – 2014-06-20 (×2): 500 mg via ORAL
  Filled 2014-06-19 (×3): qty 1

## 2014-06-19 MED ORDER — ALBUTEROL SULFATE (2.5 MG/3ML) 0.083% IN NEBU: 2.5000 mg | INHALATION_SOLUTION | Freq: Four times a day (QID) | RESPIRATORY_TRACT | Status: DC | PRN

## 2014-06-19 MED ORDER — ISOSORBIDE MONONITRATE ER 60 MG PO TB24
60.0000 mg | ORAL_TABLET | Freq: Two times a day (BID) | ORAL | Status: DC
  Administered 2014-06-19 – 2014-06-20 (×2): 60 mg via ORAL
  Filled 2014-06-19 (×3): qty 1

## 2014-06-19 MED ORDER — LIDOCAINE HCL (PF) 1 % IJ SOLN
INTRAMUSCULAR | Status: AC
  Filled 2014-06-19: qty 30

## 2014-06-19 MED ORDER — ACETAMINOPHEN 325 MG PO TABS
650.0000 mg | ORAL_TABLET | ORAL | Status: DC | PRN
  Administered 2014-06-19: 22:00:00 650 mg via ORAL
  Filled 2014-06-19: qty 2

## 2014-06-19 MED ORDER — MIDAZOLAM HCL 2 MG/2ML IJ SOLN
INTRAMUSCULAR | Status: AC
  Filled 2014-06-19: qty 2

## 2014-06-19 MED ORDER — MORPHINE SULFATE 2 MG/ML IJ SOLN: 2.0000 mg | INTRAMUSCULAR | Status: DC | PRN

## 2014-06-19 MED ORDER — ASPIRIN 81 MG PO CHEW: 81.0000 mg | CHEWABLE_TABLET | ORAL | Status: DC

## 2014-06-19 MED ORDER — MIDAZOLAM HCL 2 MG/2ML IJ SOLN
INTRAMUSCULAR | Status: DC
Start: 2014-06-19 — End: 2014-06-19
  Filled 2014-06-19: qty 2

## 2014-06-19 MED ORDER — SIMVASTATIN 20 MG PO TABS
20.0000 mg | ORAL_TABLET | Freq: Every day | ORAL | Status: DC
  Administered 2014-06-19: 21:00:00 20 mg via ORAL
  Filled 2014-06-19 (×2): qty 1

## 2014-06-19 NOTE — Interval H&P Note (Signed)
History and Physical Interval Note:  06/19/2014 2:37 PM  [name] [name]  has presented today for surgery, with the diagnosis of abnormal nuclear stress test, Class III angina.  The various methods of treatment have been discussed with the patient and family. After consideration of risks, benefits and other options for treatment, the patient has consented to  Procedure(s): LEFT HEART CATHETERIZATION WITH CORONARY/GRAFT ANGIOGRAM (N/A) as a surgical intervention .  The patient's history has been reviewed, patient examined, no change in status, stable for surgery.  I have reviewed the patient's chart and labs.  Questions were answered to the patient's satisfaction.     Briceida Rasberry W  Cath Lab Visit (complete for each Cath Lab visit)  Clinical Evaluation Leading to the Procedure:   ACS: No.  Non-ACS:    Anginal Classification: CCS III  Anti-ischemic medical therapy: Maximal Therapy (2 or more classes of medications)  Non-Invasive Test Results: Intermediate-risk stress test findings: cardiac mortality 1-3%/year  Prior CABG: Previous CABG

## 2014-06-19 NOTE — CV Procedure (Signed)
CARDIAC CATHETERIZATION AND PERCUTANEOUS CORONARY INTERVENTION REPORT  NAME:  [name] [name]   MRN: [id number] DOB:  05/16/1966   ADMIT DATE: 06/19/2014 Procedure Date: 06/19/2014  INTERVENTIONAL CARDIOLOGIST: Leonie Man, M.D., MS PRIMARY CARE PROVIDER: [name], MD PRIMARY CARDIOLOGIST: Satira Sark, M.D., F.A.C.C.  PATIENT:  [name] [name] is a 48 y.o. male  with h/o CAD s/p CABG x 3 (08/2002), hyperlipidemia, chronic smoker, ischemic cardiomyopathy w/ EF 35-40%, with most recent NSTEMI in 07/2013; recent cath at that time showing patent LIMA to LAD, occluded SVG to RCA and high grade disease in SVG to dignonal/ramus/OM1 s/p DES placement (3.0 mm x 16 mm).  In 12/2013, he presented with Unstable Angina & underwent Cutting Balloon PTCA to ISR of SVG-RI. He has severe native vessel and graft disease as outlined, has not been able to quit smoking cigarettes which negatively impacts his health as well. His blood pressure and heart rate are well controlled on a broad medical regimen.  He underwent Myoview ST -- Intermediate risk study with evidence of scar and mild to moderate peri-infarct ischemia in the inferolateral wall.  Dr. Domenic Polite have discussed referral for a followup diagnostic heart catheterization to see if there any remaining revascularization options that might help to improve his symptoms. He is in agreement to proceed. He now presents for [facility] +/- PCI.  PRE-OPERATIVE DIAGNOSIS:   Principal Problem:   Angina, class III Active Problems:   Abnormal myocardial perfusion study   HYPERLIPIDEMIA-MIXED   TOBACCO USER   Atherosclerotic heart disease of native coronary artery with angina pectoris   Essential hypertension, benign   Sinus bradycardia   Ischemic cardiomyopathy  PROCEDURES PERFORMED:    Left Heart Catheterization with Native Coronary And Graft Angiography  via Left Radial Artery   Percutaneous Coronary Intervention of the Distal SVG-RI 95%  stenosis reduced to 0% With a Xience Alpine DES 3.0 mm x 18 mm (3.3 mm)  PROCEDURE: The patient was brought to the 2nd Floor [facility] in the fasting state and prepped and draped in the usual sterile fashion for Left Radial artery access. A modified Allen's test was performed on the left wrist demonstrating excellent collateral flow for radial access.   Sterile technique was used including antiseptics, cap, gloves, gown, hand hygiene, mask and sheet. Skin prep: Chlorhexidine.   Consent: Risks of procedure as well as the alternatives and risks of each were explained to the (patient/caregiver). Consent for procedure obtained.   Time Out: Verified patient identification, verified procedure, site/side was marked, verified correct patient position, special equipment/implants available, medications/allergies/relevent history reviewed, required imaging and test results available. Performed.  Access:   Left Radial Artery: 6 Fr Sheath -  modified Seldinger Technique (Angiocath Micropuncture Kit)  Radial Cocktail - 10 mL; IV Heparin 5000 Units   Left Heart Catheterization: 5 Fr Catheters advanced or exchanged over a Long Exchange Safety J-wire; TIG 4.0 catheter advanced first.  LEFT & RIGHT Coronary Artery, SVG-RI Cineangiography: TIG 4.0 Catheter   LIMA-LAD Cineangiography: TIG 4.0 Catheter redirected into Left Subclavian Artery & exchanged over long-exchange wire for IMA catheter.  LV Hemodynamics (LV Gram): TIG 4.0  PCI: 6Fr AL-1  Sheath removed in the CARDIAC CATH LAB with TR BAND applied for hemostasis.  TR Band: 1545  Hours; 10 mL air  FINDINGS:  Hemodynamics:   Central Aortic Pressure / Mean: 95/64/79 mmHg  Left Ventricular Pressure / LVEDP: 90/7/15 mmHg  Left Ventriculography: Deferred  Coronary Anatomy:  Dominance: Right  Left Main: long, moderate to large caliber; diffusely diseased; trifurcates to occluded LAD & ostially diseased Ramus Intermedius &  Circumflex. LAD: 100% ostial occlusion;; fills via Patent LIMA that retrograde fills a proximal D1; antegrade flow shows a diffusely diseased distal LAD that wraps the apex. Septal perforator collaterals fill the entire RPDA.  LIMA-LAD: Widely patent graft, tortuous; angiographically normal.  Left Circumflex: diffuse disease with ostial/proximal ~80%. Distally, small vessel with no notable OM branches. OM1/RI - ~95% early mid stenosis.    SVG-RI (~D1)-OM1: Ostial stent with mid stent with roughly 20 % focal in-stent restenosis. The distal graft just prior to the anastomosis is a 95% stenosis involving the anastomotic segment.   RCA: Large caliber vessel that is abruptly occluded in the early mid-segment.  RPDA: fills via L-R collaterals from sepal perforators; faint retrograde filling of the Right Posterolateral system.  SVG-RPDA: Known to be occluded.   After reviewing the initial angiography, the culprit lesion was thought to be 95% focal stenosis of the distal SVG-OM/RI.  Preparation were made to proceed with PCI on this lesion. As the lesion is so distal, there is note opportunity to use a filter device.  Percutaneous Coronary Intervention:     95% distal SVG-RIA reduced to 0%; TIMI 3 flow pre-and post Guide: 6 Fr   AL-1 Guidewire: BMW Predilation Balloon: Euphora 2.0 mm x 12 mm;   10 Atm x 20 Sec,  Stent: Xience Alpine DES 3.0 mm x 18 mm;   14 Atm x 30 Sec,   Postdilated the stent balloon: 18 Atm x 30 Sec; Final Diameter 3.3 mm  Post deployment angiography in multiple views, with and without guidewire in place revealed excellent stent deployment and lesion coverage.  There was no evidence of dissection or perforation.  MEDICATIONS:  Anesthesia:  Local Lidocaine 2 ml  Sedation:  2 mg IV Versed, 50 mcg IV fentanyl ;   Omnipaque Contrast: 150 ml  Anticoagulation:  IV Heparin 5000 Units ; additional 8000 units for PCI (ACT ~253 Sec)  Anti-Platelet Agent:  300 mg  Plavix  PATIENT DISPOSITION:    The patient was transferred to the PACU holding area in a hemodynamicaly stable, chest pain free condition.  The patient tolerated the procedure well, and there were no complications.  EBL:   < 10 ml  The patient was stable before, during, and after the procedure.  POST-OPERATIVE DIAGNOSIS:    Severe native Vessel CAD (no notable change)   Severe Graft Disease with Occluded SVG-RCA & distal SVG-RI DES 95% de novo lesion --> s/p PCI with Xience Alpine DES  PLAN OF CARE:  Transfer to [facility] post -procedure suite for standard post catheterization care.   Continue to adjust CAD/ CHF medications.   DAPT for a minimum of 1 yr, but would continue lifelong based on the extent of disease.  Anticipate discharge in AM if stable Smoking cessation counseling.   Leonie Man, M.D., M.S. [facility] [address] [place], [place]  [address]  [phone]  06/19/2014 3:44 PM

## 2014-06-19 NOTE — H&P (View-Only) (Signed)
Clinical Summary [name] [name] is a 48 y.o.male last seen in August. Since that time he has reported increasing episodes of angina despite good medical therapy, and reported compliance with his medications. He was referred for a followup Lexiscan Cardiolite which was overall intermediate risk showing a region of inferolateral scar with mild to moderate peri-infarct ischemia, inferior hypokinesis, and LVEF of 40%. He is here with his wife today to review the results. He tells me that he continues to have regular angina symptoms, has already almost gone through a complete bottle of nitroglycerin just since the beginning of this month. He has had clearly accelerating angina symptoms since I last saw him.  Cardiac catheterization in March of this year showed severe native vessel CAD, occluded SVG to RCA, severely diseased SVG to ramus intermedius and obtuse marginal requiring DES in the proximal segment with cutting balloon angioplasty.   Difficult situation. He has severe native vessel and graft disease as outlined, has not been able to quit smoking cigarettes which negatively impacts his health as well. His blood pressure and heart rate are well controlled on a broad medical regimen. We have discussed referral for a followup diagnostic heart catheterization to see if there any remaining revascularization options that might help to improve his symptoms. He is in agreement to proceed.   No Known Allergies  Current Outpatient Prescriptions  Medication Sig Dispense Refill  . albuterol (PROVENTIL HFA;VENTOLIN HFA) 108 (90 BASE) MCG/ACT inhaler Inhale 2 puffs into the lungs every 6 (six) hours as needed for wheezing or shortness of breath.      Marland Kitchen aspirin EC 81 MG tablet Take 1 tablet (81 mg total) by mouth daily.      . carvedilol (COREG) 6.25 MG tablet Take 1 tablet (6.25 mg total) by mouth 2 (two) times daily with a meal.  60 tablet  6  . clopidogrel (PLAVIX) 75 MG tablet Take 1 tablet (75 mg total) by  mouth daily.  30 tablet  11  . diazepam (VALIUM) 10 MG tablet Take 10 mg by mouth 2 (two) times daily.      . furosemide (LASIX) 20 MG tablet Take 20 mg by mouth at bedtime.      Marland Kitchen HYDROcodone-acetaminophen (NORCO) 7.5-325 MG per tablet Take 1 tablet by mouth every 6 (six) hours as needed for moderate pain.      . isosorbide mononitrate (IMDUR) 60 MG 24 hr tablet Take 1 tablet (60 mg total) by mouth 2 (two) times daily.  180 tablet  3  . lisinopril (PRINIVIL,ZESTRIL) 2.5 MG tablet Take 1 tablet (2.5 mg total) by mouth 2 (two) times daily.  180 tablet  3  . nitroGLYCERIN (NITROSTAT) 0.4 MG SL tablet Place 1 tablet (0.4 mg total) under the tongue every 5 (five) minutes x 3 doses as needed for chest pain.  25 tablet  3  . Omega-3 Fatty Acids (FISH OIL) 1000 MG CAPS Take 1,000 mg by mouth 2 (two) times daily.      . pantoprazole (PROTONIX) 40 MG tablet Take 40 mg by mouth daily.       . pravastatin (PRAVACHOL) 80 MG tablet Take 1 tablet (80 mg total) by mouth every evening.  90 tablet  3  . ranolazine (RANEXA) 500 MG 12 hr tablet Take 1 tablet (500 mg total) by mouth 2 (two) times daily.  180 tablet  3   No current facility-administered medications for this visit.    Past Medical History  Diagnosis Date  .  Coronary atherosclerosis of native coronary artery     a. CABG 2003. b. NSTEMI 2010 s/p BMS to SVG-RCA c. PTCA/stent/asp thromb to SVG-RCA 01/2010, PTCA to SVG-RCA 04/2010 d. known occlusion of SVG-RCA 2013. e. NSTEMI 07/2013  s/p PTCA/DES to SVG-diagonal/intermediate/OM1.   . Mixed hyperlipidemia   . Bipolar disorder   . GERD (gastroesophageal reflux disease)   . Gout   . Chronic back pain   . Tobacco abuse   . Ischemic cardiomyopathy     a. EF 35-40% by cath 07/2013.  . Sinus bradycardia   . CHF (congestive heart failure)   . Hypertension   . Asthma     Past Surgical History  Procedure Laterality Date  . Cholecystectomy    . Coronary artery bypass graft      2003, LIMA to LAD; SVG  to diagonal; SVG to OM, ramus, and diagonal; SVG to RCA  . Pin placement left leg    . Metal plate to chin      Family History  Problem Relation Age of Onset  . Stroke      family Hx     Social History Mr. Bott reports that he has been smoking Cigarettes.  He has a 26.4 pack-year smoking history. He has never used smokeless tobacco. Mr. Mcinturff reports that he does not drink alcohol.  Review of Systems No palpitations or syncope. NYHA class 2-3 shortness of breath. No active bleeding problems. Other systems reviewed and negative except as outlined.  Physical Examination Filed Vitals:   06/18/14 1338  BP: 102/78  Pulse: 75   Filed Weights   06/18/14 1338  Weight: 207 lb (93.895 kg)    Overweight male no distress.  HEENT: Conjunctiva and lids normal, oropharynx clear with poor dentition. Neck: Supple, no elevated JVP or carotid bruits, no thyromegaly.  Lungs: Clear to auscultation, nonlabored breathing at rest.  Cardiac: Regular rate and rhythm, no S3, no pericardial rub.  Abdomen: Soft, nontender, bowel sounds present.  Extremities: No pitting edema, distal pulses 2+.  Skin: Warm and dry with scattered tattoos. Musculoskeletal: No kyphosis. Neuropsychiatric: Alert and oriented x3, affect flat.   Problem List and Plan   Abnormal myocardial perfusion study Intermediate risk study with evidence of scar and mild to moderate peri-infarct ischemia in the inferolateral wall. Last intervention was to vein graft to the ramus intermedius and obtuse marginal system, he has known occlusion of the vein graft to the RCA. In light of accelerating angina symptoms on good medical therapy, he is being referred back for diagnostic heart catheterization to see [place] there are any remaining revascularization options that might help his symptoms.  CAD, NATIVE VESSEL Severe native vessel disease status post bypass surgery with documented graft disease as well.  Ischemic cardiomyopathy LVEF  approximately 40%.  TOBACCO USER Ongoing, he has not been able to quit. We continue to discuss the critical importance to his health of complete smoking cessation.  HYPERLIPIDEMIA-MIXED He continues on Pravachol.    Jonelle Sidle, M.D., F.A.C.C.

## 2014-06-20 ENCOUNTER — Encounter (HOSPITAL_COMMUNITY): Payer: Self-pay | Admitting: *Deleted

## 2014-06-20 DIAGNOSIS — I25810 Atherosclerosis of coronary artery bypass graft(s) without angina pectoris: Secondary | ICD-10-CM

## 2014-06-20 DIAGNOSIS — I2510 Atherosclerotic heart disease of native coronary artery without angina pectoris: Secondary | ICD-10-CM | POA: Diagnosis present

## 2014-06-20 DIAGNOSIS — I2589 Other forms of chronic ischemic heart disease: Secondary | ICD-10-CM

## 2014-06-20 DIAGNOSIS — F17200 Nicotine dependence, unspecified, uncomplicated: Secondary | ICD-10-CM

## 2014-06-20 DIAGNOSIS — I10 Essential (primary) hypertension: Secondary | ICD-10-CM

## 2014-06-20 DIAGNOSIS — E782 Mixed hyperlipidemia: Secondary | ICD-10-CM | POA: Diagnosis not present

## 2014-06-20 DIAGNOSIS — E785 Hyperlipidemia, unspecified: Secondary | ICD-10-CM

## 2014-06-20 DIAGNOSIS — I200 Unstable angina: Secondary | ICD-10-CM

## 2014-06-20 DIAGNOSIS — I209 Angina pectoris, unspecified: Secondary | ICD-10-CM

## 2014-06-20 LAB — BASIC METABOLIC PANEL
Anion gap: 13 (ref 5–15)
BUN: 9 mg/dL (ref 6–23)
CO2: 26 mEq/L (ref 19–32)
CREATININE: 0.79 mg/dL (ref 0.50–1.35)
Calcium: 9 mg/dL (ref 8.4–10.5)
Chloride: 101 mEq/L (ref 96–112)
GFR calc Af Amer: >90 mL/min (ref >90)
GFR calc non Af Amer: >90 mL/min (ref >90)
GLUCOSE: 90 mg/dL (ref 70–99)
Potassium: 4.2 mEq/L (ref 3.7–5.3)
Sodium: 140 mEq/L (ref 137–147)

## 2014-06-20 LAB — CBC
HEMATOCRIT: 41.3 % (ref 39.0–52.0)
HEMOGLOBIN: 14.4 g/dL (ref 13.0–17.0)
MCH: 34.4 pg — AB (ref 26.0–34.0)
MCHC: 34.9 g/dL (ref 30.0–36.0)
MCV: 98.8 fL (ref 78.0–100.0)
Platelets: 190 10*3/uL (ref 150–400)
RBC: 4.18 MIL/uL — ABNORMAL LOW (ref 4.22–5.81)
RDW: 13.2 % (ref 11.5–15.5)
WBC: 5.6 10*3/uL (ref 4.0–10.5)

## 2014-06-20 LAB — PLATELET INHIBITION P2Y12: Platelet Function  P2Y12: 191 [PRU] — ABNORMAL LOW (ref 194–418)

## 2014-06-20 MED ORDER — INFLUENZA VAC SPLIT QUAD 0.5 ML IM SUSY
0.5000 mL | PREFILLED_SYRINGE | INTRAMUSCULAR | Status: DC
  Filled 2014-06-20: qty 0.5

## 2014-06-20 MED ORDER — INFLUENZA VAC SPLIT QUAD 0.5 ML IM SUSY
0.5000 mL | PREFILLED_SYRINGE | INTRAMUSCULAR | Status: AC | PRN
  Administered 2014-06-20: 0.5 mL via INTRAMUSCULAR

## 2014-06-20 MED ORDER — ACETAMINOPHEN 325 MG PO TABS: 650.0000 mg | ORAL_TABLET | ORAL | Status: AC | PRN

## 2014-06-20 NOTE — Discharge Summary (Signed)
Patient ID: [name] [name],  MRN: [id number], DOB/AGE: 1966/06/29 48 y.o.  Admit date: 06/19/2014 Discharge date: 06/20/2014  Primary Care Provider: Toma Deiters, MD Primary Cardiologist: Dr Diona Browner  Discharge Diagnoses Principal Problem:   Angina, class III Active Problems:   Abnormal myocardial perfusion study   CABG x 3 11/03, PCI SVG-RI 10/14, 3/15, 06/19/14   HYPERLIPIDEMIA-MIXED   Essential hypertension, benign   Ischemic cardiomyopathy-35-40%   TOBACCO USER   Sinus bradycardia    Procedures: Cath/PCI 06/19/14    Hospital Course: [name] [name] is a 48 y.o. male with h/o CAD s/p CABG x 3 (08/2002), hyperlipidemia, chronic smoker, ICM w/ EF 35-40%. He had a NSTEMI in 07/2013; cath at that time showing patent LIMA to LAD, occluded SVG to RCA and high grade disease in SVG to dignonal/ramus/OM1 treated with DES placement (3.0 mm x 16 mm).  In 12/2013, he presented with unstable angina & underwent Cutting Balloon PTCA to ISR of SVG-RI. He has not been able to quit smoking cigarettes. His blood pressure and heart rate are well controlled on a broad medical regimen. He underwent Myoview ST 06/17/14 for recurrent chest pain worrisome for angina. This interpreted as an intermediate risk study with evidence of scar and mild to moderate peri-infarct ischemia in the inferolateral wall.  He was contacted after his scan and set up for OP for cath. This was done 06/19/14 and revealed a 95% focal stenosis of the distal SVG-OM/RI. This was treated with a Xience DES. He tolerated the procedure well. He was seen the morning of the 12th and felt to be stable for discharge.     Discharge Vitals:  Blood pressure 108/76, pulse 59, temperature 98.2 F (36.8 C), temperature source Oral, resp. rate 18, height 5\' 4"  (1.626 m), weight 207 lb (93.895 kg), SpO2 95.00%.    Labs: Results for orders placed during the hospital encounter of 06/19/14 (from the past 24 hour(s))  GLUCOSE, CAPILLARY      Status: None   Collection Time    06/19/14 10:37 AM      Result Value Ref Range   Glucose-Capillary 89  70 - 99 mg/dL  BASIC METABOLIC PANEL     Status: None   Collection Time    06/19/14 11:00 AM      Result Value Ref Range   Sodium 138  137 - 147 mEq/L   Potassium 4.9  3.7 - 5.3 mEq/L   Chloride 98  96 - 112 mEq/L   CO2 28  19 - 32 mEq/L   Glucose, Bld 92  70 - 99 mg/dL   BUN 10  6 - 23 mg/dL   Creatinine, Ser 5.36  0.50 - 1.35 mg/dL   Calcium 9.0  8.4 - 64.4 mg/dL   GFR calc non Af Amer >90  >90 mL/min   GFR calc Af Amer >90  >90 mL/min   Anion gap 12  5 - 15  CBC     Status: Abnormal   Collection Time    06/19/14 11:00 AM      Result Value Ref Range   WBC 5.5  4.0 - 10.5 K/uL   RBC 4.07 (*) 4.22 - 5.81 MIL/uL   Hemoglobin 13.9  13.0 - 17.0 g/dL   HCT 03.4  74.2 - 59.5 %   MCV 99.0  78.0 - 100.0 fL   MCH 34.2 (*) 26.0 - 34.0 pg   MCHC 34.5  30.0 - 36.0 g/dL   RDW 13.1  11.5 - 15.5 %   Platelets 189  150 - 400 K/uL  PROTIME-INR     Status: None   Collection Time    06/19/14 11:00 AM      Result Value Ref Range   Prothrombin Time 13.3  11.6 - 15.2 seconds   INR 1.01  0.00 - 1.49  POCT ACTIVATED CLOTTING TIME     Status: None   Collection Time    06/19/14  3:18 PM      Result Value Ref Range   Activated Clotting Time 242    POCT ACTIVATED CLOTTING TIME     Status: None   Collection Time    06/19/14  3:40 PM      Result Value Ref Range   Activated Clotting Time 253    MRSA PCR SCREENING     Status: None   Collection Time    06/19/14  5:00 PM      Result Value Ref Range   MRSA by PCR NEGATIVE  NEGATIVE  BASIC METABOLIC PANEL     Status: None   Collection Time    06/20/14  5:13 AM      Result Value Ref Range   Sodium 140  137 - 147 mEq/L   Potassium 4.2  3.7 - 5.3 mEq/L   Chloride 101  96 - 112 mEq/L   CO2 26  19 - 32 mEq/L   Glucose, Bld 90  70 - 99 mg/dL   BUN 9  6 - 23 mg/dL   Creatinine, Ser 8.29  0.50 - 1.35 mg/dL   Calcium 9.0  8.4 - 56.2 mg/dL    GFR calc non Af Amer >90  >90 mL/min   GFR calc Af Amer >90  >90 mL/min   Anion gap 13  5 - 15  CBC     Status: Abnormal   Collection Time    06/20/14  5:13 AM      Result Value Ref Range   WBC 5.6  4.0 - 10.5 K/uL   RBC 4.18 (*) 4.22 - 5.81 MIL/uL   Hemoglobin 14.4  13.0 - 17.0 g/dL   HCT 13.0  86.5 - 78.4 %   MCV 98.8  78.0 - 100.0 fL   MCH 34.4 (*) 26.0 - 34.0 pg   MCHC 34.9  30.0 - 36.0 g/dL   RDW 69.6  29.5 - 28.4 %   Platelets 190  150 - 400 K/uL  PLATELET INHIBITION P2Y12     Status: Abnormal   Collection Time    06/20/14  5:13 AM      Result Value Ref Range   Platelet Function  P2Y12 191 (*) 194 - 418 PRU    Disposition:      Follow-up Information   Follow up with Nona Dell, MD. (office will call you)    Specialty:  Cardiology   Contact information:   [address] [place] [address] [phone]       Discharge Medications:    Medication List         acetaminophen 325 MG tablet  Commonly known as:  TYLENOL  Take 2 tablets (650 mg total) by mouth every 4 (four) hours as needed for headache or mild pain.     albuterol 108 (90 BASE) MCG/ACT inhaler  Commonly known as:  PROVENTIL HFA;VENTOLIN HFA  Inhale 2 puffs into the lungs every 6 (six) hours as needed for wheezing or shortness of breath.     aspirin EC 81  MG tablet  Take 1 tablet (81 mg total) by mouth daily.     carvedilol 6.25 MG tablet  Commonly known as:  COREG  Take 1 tablet (6.25 mg total) by mouth 2 (two) times daily with a meal.     clopidogrel 75 MG tablet  Commonly known as:  PLAVIX  Take 1 tablet (75 mg total) by mouth daily.     diazepam 10 MG tablet  Commonly known as:  VALIUM  Take 10 mg by mouth 2 (two) times daily.     Fish Oil 1000 MG Caps  Take 1,000 mg by mouth 2 (two) times daily.     furosemide 20 MG tablet  Commonly known as:  LASIX  Take 20 mg by mouth at bedtime.     HYDROcodone-acetaminophen 7.5-325 MG per tablet  Commonly known as:  NORCO  Take  1 tablet by mouth every 6 (six) hours as needed for moderate pain.     isosorbide mononitrate 60 MG 24 hr tablet  Commonly known as:  IMDUR  Take 1 tablet (60 mg total) by mouth 2 (two) times daily.     lisinopril 2.5 MG tablet  Commonly known as:  PRINIVIL,ZESTRIL  Take 1 tablet (2.5 mg total) by mouth 2 (two) times daily.     nitroGLYCERIN 0.4 MG SL tablet  Commonly known as:  NITROSTAT  Place 1 tablet (0.4 mg total) under the tongue every 5 (five) minutes x 3 doses as needed for chest pain.     pantoprazole 40 MG tablet  Commonly known as:  PROTONIX  Take 40 mg by mouth daily.     pravastatin 80 MG tablet  Commonly known as:  PRAVACHOL  Take 1 tablet (80 mg total) by mouth every evening.     ranolazine 500 MG 12 hr tablet  Commonly known as:  RANEXA  Take 1 tablet (500 mg total) by mouth 2 (two) times daily.         Duration of Discharge Encounter: Greater than 30 minutes including physician time.  Jolene Provost PA-C 06/20/2014 9:33 AM

## 2014-06-20 NOTE — Discharge Instructions (Signed)
Coronary Angiogram with Stent °Coronary angiography with stent placement is a procedure to widen or open a narrow blood vessel of the heart (coronary artery). When a coronary artery becomes partially blocked, it decreases blood flow to that area. This may lead to chest pain or a heart attack (myocardial infarction). Arteries may become blocked by cholesterol buildup (plaque) in the lining or wall.  °A stent is a small piece of metal that looks like a mesh or a spring. Stent placement may be done right after a coronary angiography in which a blocked artery is found or as a treatment for a heart attack.  °LET YOUR HEALTH CARE PROVIDER KNOW ABOUT: °· Any allergies you have.   °· All medicines you are taking, including vitamins, herbs, eye drops, creams, and over-the-counter medicines.   °· Previous problems you or members of your family have had with the use of anesthetics.   °· Any blood disorders you have.   °· Previous surgeries you have had.   °· Medical conditions you have. °RISKS AND COMPLICATIONS °Generally, coronary angiography with stent is a safe procedure. However, problems can occur and include: °· Damage to the heart or its blood vessels.   °· A return of blockage.   °· Bleeding, infection, or bruising at the insertion site.   °· A collection of blood under the skin (hematoma) at the insertion site. °· Blood clot in another part of the body.   °· Kidney injury.   °· Allergic reaction to the dye or contrast used.   °· Bleeding into the abdomen (retroperitoneal bleeding). °BEFORE THE PROCEDURE °· Do not eat or drink anything after midnight on the night before the procedure or as directed by your health care provider.  °· Ask your health care provider about changing or stopping your regular medicines. This is especially important if you are taking diabetes medicines or blood thinners. °· Your health care provider will make sure you understand the procedure as well as the risks and potential problems  associated with the procedure.   °PROCEDURE °· You may be given a medicine to help you relax before and during the procedure (sedative). This medicine will be given through an IV tube that is put into one of your veins.   °· The area where the catheter will be inserted will be shaved and cleaned. This is usually done in the groin but may be done in the fold of your arm (near your elbow) or in the wrist.    °· A medicine will be given to numb the area where the catheter will be inserted (local anesthetic).   °· The catheter will be inserted into an artery using a guide wire. A type of X-ray (fluoroscopy) will be used to help guide the catheter to the opening of the blocked artery.   °· A dye will then be injected into the catheter, and X-rays will be taken. The dye will help to show where any narrowing or blockages are located in the heart arteries.   °· A tiny wire will be guided to the blocked spot, and a balloon will be inflated to make the artery wider. The stent will be expanded and will crush the plaque into the wall of the vessel. The stent will hold the area open like a scaffolding and improve the blood flow.   °· Sometimes the artery may be made wider using a laser or other tools to remove plaque.   °· When the blood flow is better, the catheter will be removed. The lining of the artery will grow over the stent, which stays where it was placed.   °  AFTER THE PROCEDURE °· If the procedure is done through the leg, you will be kept in bed lying flat for about 6 hours. You will be instructed to not bend or cross your legs.   °· The insertion site will be checked frequently.   °· The pulse in your feet or wrist will be checked frequently.   °· Additional blood tests, X-rays, and electrocardiography may be done. °Document Released: 04/01/2003 Document Revised: 02/09/2014 Document Reviewed: 04/03/2013 °Exit[employer]® Patient Information ©2015 [employer]. This information is not intended to replace advice given to you  by your health care provider. Make sure you discuss any questions you have with your health care provider. ° °

## 2014-06-20 NOTE — Progress Notes (Signed)
CARDIAC REHAB PHASE I   PRE:  Rate/Rhythm: 54 sinus brady  BP:  Supine: 80/40  Sitting: 101/65  Standing:    SaO2:   MODE:  Ambulation: 600 ft   POST:  Rate/Rhythem: 73 sinus  BP:  Supine:   Sitting: 111/82  Standing:    SaO2:  Pt ambulated 600 ft with assist x1.  Pt tolerated walk well, but did complain of chronic back pain.  Reviewed stent/plavix, ntg use/calling 911/MD, restrictions, risk factors, smoking cessation, exercise, and diet.  Pt has been doing better with diet and exercise.  Pt declined Phase II Cardiac Rehab with Family membership to [facility] in [place].  Pt is motivated to quit smoking this time.  Pt voiced understanding. [name] Pierce, MA, ACSM RCEP [phone]  [name] [name]

## 2014-06-20 NOTE — Progress Notes (Signed)
    Subjective:  No chest pain overnight  Objective:  Vital Signs in the last 24 hours: Temp:  [97.6 F (36.4 C)-98.3 F (36.8 C)] 98.2 F (36.8 C) (09/12 0756) Pulse Rate:  [33-71] 59 (09/12 0756) Resp:  [18-20] 18 (09/12 0756) BP: (74-113)/(47-76) 108/76 mmHg (09/12 0756) SpO2:  [91 %-100 %] 95 % (09/12 0756) Weight:  [207 lb (93.895 kg)] 207 lb (93.895 kg) (09/11 1030)  Intake/Output from previous day:  Intake/Output Summary (Last 24 hours) at 06/20/14 0844 Last data filed at 06/20/14 0756  Gross per 24 hour  Intake 1275.6 ml  Output   2025 ml  Net -749.4 ml    Physical Exam: General appearance: alert, cooperative, no distress and moderately obese Lungs: clear to auscultation bilaterally Heart: regular rate and rhythm   Rate: 58  Rhythm: normal sinus rhythm and sinus bradycardia  Lab Results:  Recent Labs  06/19/14 1100 06/20/14 0513  WBC 5.5 5.6  HGB 13.9 14.4  PLT 189 190    Recent Labs  06/19/14 1100 06/20/14 0513  NA 138 140  K 4.9 4.2  CL 98 101  CO2 28 26  GLUCOSE 92 90  BUN 10 9  CREATININE 0.79 0.79   No results found for this basename: TROPONINI, CK, MB,  in the last 72 hours  Recent Labs  06/19/14 1100  INR 1.01    Imaging: Imaging results have been reviewed  Cardiac Studies:  Assessment/Plan:   Principal Problem:   Angina, class III Active Problems:   Abnormal myocardial perfusion study   CABG x 3 11/03, PCI SVG-RI 10/14, 3/15, 06/19/14   HYPERLIPIDEMIA-MIXED   Essential hypertension, benign   Ischemic cardiomyopathy-35-40%   TOBACCO USER   Sinus bradycardia    PLAN: OK for discharge. F/U Dr Diona Browner in [place].   Corine Shelter PA-C Beeper [phone] 06/20/2014, 8:44 AM   I have seen and examined the patient along with Corine Shelter PA-C.  I have reviewed the chart, notes and new data.  I agree with PA's note.  Key new complaints: no angina Key examination changes: no complications at access site, no signs of acute  HF   PLAN: Dual antiplatelet therapy discussed - critical to avoid complications in near/mid future Smoking cessation is critical to avoid long term recurrence  Thurmon Fair, MD, [facility] [phone] 06/20/2014, 9:04 AM

## 2014-07-01 ENCOUNTER — Ambulatory Visit (INDEPENDENT_AMBULATORY_CARE_PROVIDER_SITE_OTHER): Payer: PRIVATE HEALTH INSURANCE | Admitting: Cardiology

## 2014-07-01 ENCOUNTER — Encounter: Payer: Self-pay | Admitting: Cardiology

## 2014-07-01 VITALS — BP 114/79 | HR 55 | Ht 64.0 in | Wt 212.0 lb

## 2014-07-01 DIAGNOSIS — I10 Essential (primary) hypertension: Secondary | ICD-10-CM | POA: Diagnosis not present

## 2014-07-01 DIAGNOSIS — F17200 Nicotine dependence, unspecified, uncomplicated: Secondary | ICD-10-CM | POA: Diagnosis not present

## 2014-07-01 DIAGNOSIS — I2510 Atherosclerotic heart disease of native coronary artery without angina pectoris: Secondary | ICD-10-CM | POA: Diagnosis not present

## 2014-07-01 NOTE — Progress Notes (Signed)
Clinical Summary [name] is a medically complex 48 y.o.male seen recently in the office with symptoms of accelerating angina and intermediate risk Cardiolite study. He was referred for a cardiac catheterization.  Cardiac catheterization performed by Dr. Herbie Baltimore on September 11 demonstrated ostial occlusion of the LAD with diffusely diseased distal LAD that fills via patent LIMA, 80% proximal circumflex, 95% OM1/ramus, 20% in-stent restenosis within the SVG to OM1/ramus with severe distal stenosis of 95% within the graft segment, occluded mid RCA with some left-to-right collateralization, known occlusion of the SVG to PDA. Patient underwent placement of DES to the distal SVG to OM1/ramus system.  Recent lab work showed potassium 4.2, BUN 9, creatinine 0.7, hemoglobin 14.4, platelets 190.  He states he feels much better, angina has improved. He reports compliance with medications. Main focus now is trying to quit smoking. He did not tolerate Wellbutrin in the past due to headaches and nightmares. He is trying to use nicotine patches.   No Known Allergies  Current Outpatient Prescriptions  Medication Sig Dispense Refill  . acetaminophen (TYLENOL) 325 MG tablet Take 2 tablets (650 mg total) by mouth every 4 (four) hours as needed for headache or mild pain.      [name] [name] Kitchen albuterol (PROVENTIL HFA;VENTOLIN HFA) 108 (90 BASE) MCG/ACT inhaler Inhale 2 puffs into the lungs every 6 (six) hours as needed for wheezing or shortness of breath.      [name] [name] Kitchen aspirin EC 81 MG tablet Take 1 tablet (81 mg total) by mouth daily.      . carvedilol (COREG) 6.25 MG tablet Take 1 tablet (6.25 mg total) by mouth 2 (two) times daily with a meal.  60 tablet  6  . clopidogrel (PLAVIX) 75 MG tablet Take 1 tablet (75 mg total) by mouth daily.  30 tablet  11  . diazepam (VALIUM) 10 MG tablet Take 10 mg by mouth 2 (two) times daily.      . furosemide (LASIX) 20 MG tablet Take 20 mg by mouth at bedtime.      [name] [name] Kitchen  HYDROcodone-acetaminophen (NORCO) 7.5-325 MG per tablet Take 1 tablet by mouth every 6 (six) hours as needed for moderate pain.      . isosorbide mononitrate (IMDUR) 60 MG 24 hr tablet Take 1 tablet (60 mg total) by mouth 2 (two) times daily.  180 tablet  3  . lisinopril (PRINIVIL,ZESTRIL) 2.5 MG tablet Take 1 tablet (2.5 mg total) by mouth 2 (two) times daily.  180 tablet  3  . nitroGLYCERIN (NITROSTAT) 0.4 MG SL tablet Place 1 tablet (0.4 mg total) under the tongue every 5 (five) minutes x 3 doses as needed for chest pain.  25 tablet  3  . Omega-3 Fatty Acids (FISH OIL) 1000 MG CAPS Take 1,000 mg by mouth 2 (two) times daily.      . pantoprazole (PROTONIX) 40 MG tablet Take 40 mg by mouth daily.       . pravastatin (PRAVACHOL) 80 MG tablet Take 1 tablet (80 mg total) by mouth every evening.  90 tablet  3  . ranolazine (RANEXA) 500 MG 12 hr tablet Take 1 tablet (500 mg total) by mouth 2 (two) times daily.  180 tablet  3   No current facility-administered medications for this visit.    Past Medical History  Diagnosis Date  . Coronary atherosclerosis of native coronary artery     a. CABG 2003. b. NSTEMI 2010 s/p BMS to SVG-RCA c. PTCA/stent/asp thromb to SVG-RCA 01/2010, PTCA to SVG-RCA  04/2010 d. known occlusion of SVG-RCA 2013. e. NSTEMI 07/2013  s/p PTCA/DES to SVG-diagonal/intermediate/OM1.   . Mixed hyperlipidemia   . Bipolar disorder   . GERD (gastroesophageal reflux disease)   . Gout   . Chronic back pain   . Tobacco abuse   . Ischemic cardiomyopathy     a. EF 35-40% by cath 07/2013.  . Sinus bradycardia   . CHF (congestive heart failure)   . Hypertension   . Asthma     Past Surgical History  Procedure Laterality Date  . Cholecystectomy    . Coronary artery bypass graft      2003, LIMA to LAD; SVG to diagonal; SVG to OM, ramus, and diagonal; SVG to RCA  . Pin placement left leg    . Metal plate to chin      Social History [name] reports that he has been smoking  Cigarettes.  He started smoking about 36 years ago. He has a 26.4 pack-year smoking history. He has never used smokeless tobacco. [name] reports that he does not drink alcohol.  Review of Systems Other systems reviewed and negative except as outlined.  Physical Examination Filed Vitals:   07/01/14 1458  BP: 114/79  Pulse: 55   Filed Weights   07/01/14 1458  Weight: 212 lb (96.163 kg)    Overweight male no distress.  HEENT: Conjunctiva and lids normal, oropharynx clear with poor dentition.  Neck: Supple, no elevated JVP or carotid bruits, no thyromegaly.  Lungs: Clear to auscultation, nonlabored breathing at rest.  Cardiac: Regular rate and rhythm, no S3, no pericardial rub.  Abdomen: Soft, nontender, bowel sounds present.  Extremities: No pitting edema, distal pulses 2+.    Problem List and Plan   Coronary atherosclerosis of native coronary artery Severe native vessel disease as well as graft disease, recently status post BMS to the distal segment of the SVG to OM1/ramus. Symptoms have improved. Continue medical therapy.  TOBACCO USER Smoking cessation discussed again, he does seem more motivated.  Essential hypertension, benign Blood pressure well controlled.    Jonelle Sidle, M.D., F.A.C.C.

## 2014-07-01 NOTE — Assessment & Plan Note (Signed)
Severe native vessel disease as well as graft disease, recently status post BMS to the distal segment of the SVG to OM1/ramus. Symptoms have improved. Continue medical therapy.

## 2014-07-01 NOTE — Assessment & Plan Note (Signed)
Smoking cessation discussed again, he does seem more motivated.

## 2014-07-01 NOTE — Patient Instructions (Signed)
Your physician recommends that you schedule a follow-up appointment in: 09/17/14 @10 :40 am with Dr. Diona Browner. Your physician recommends that you continue on your current medications as directed. Please refer to the Current Medication list given to you today.

## 2014-07-01 NOTE — Assessment & Plan Note (Signed)
Blood pressure well controlled

## 2014-07-21 ENCOUNTER — Other Ambulatory Visit: Payer: Self-pay | Admitting: *Deleted

## 2014-07-21 MED ORDER — CARVEDILOL 6.25 MG PO TABS: 6.2500 mg | ORAL_TABLET | Freq: Two times a day (BID) | ORAL | Status: DC

## 2014-07-22 ENCOUNTER — Other Ambulatory Visit: Payer: Self-pay | Admitting: Cardiology

## 2014-08-03 ENCOUNTER — Telehealth: Payer: Self-pay | Admitting: *Deleted

## 2014-08-03 NOTE — Telephone Encounter (Signed)
X 2 weeks c/o burning & stinging in both legs and feet.  This is new for him.  When he stands up, he feels tingling/numbess in feet.  No dizziness or SOB.  Stated that the only other time he felt like this was when he had his bypass surgery.  Informed patient that message will be sent to provider for advice.

## 2014-08-03 NOTE — Telephone Encounter (Signed)
Not sure about absolute etiology, but sounds like neuropathic pain. Review any new medications for possible side effects. May want to see primary MD to assess and also make sure not related to diabetes.

## 2014-08-04 NOTE — Telephone Encounter (Signed)
Patient notified.  Stated that he did go to ED last night & was started on Gabapentin 300mg  three times per day by Dr. Olena Leatherwood.  He will be calling them today to schedule follow up with their office.  Already has 4 months follow up scheduled for 09/17/14 with Dr. Diona Browner.

## 2014-08-04 NOTE — Telephone Encounter (Signed)
Left message to return call 

## 2014-09-10 ENCOUNTER — Other Ambulatory Visit: Payer: Self-pay | Admitting: Cardiology

## 2014-09-17 ENCOUNTER — Encounter (HOSPITAL_COMMUNITY): Payer: Self-pay | Admitting: Cardiovascular Disease

## 2014-09-17 ENCOUNTER — Ambulatory Visit: Payer: PRIVATE HEALTH INSURANCE | Admitting: Cardiology

## 2014-09-29 ENCOUNTER — Encounter: Payer: Self-pay | Admitting: Cardiology

## 2014-09-29 ENCOUNTER — Ambulatory Visit (INDEPENDENT_AMBULATORY_CARE_PROVIDER_SITE_OTHER): Payer: PRIVATE HEALTH INSURANCE | Admitting: Cardiology

## 2014-09-29 ENCOUNTER — Telehealth: Payer: Self-pay | Admitting: *Deleted

## 2014-09-29 VITALS — BP 91/55 | HR 58 | Ht 64.0 in | Wt 217.0 lb

## 2014-09-29 DIAGNOSIS — I25119 Atherosclerotic heart disease of native coronary artery with unspecified angina pectoris: Secondary | ICD-10-CM

## 2014-09-29 DIAGNOSIS — I10 Essential (primary) hypertension: Secondary | ICD-10-CM

## 2014-09-29 DIAGNOSIS — E782 Mixed hyperlipidemia: Secondary | ICD-10-CM

## 2014-09-29 DIAGNOSIS — Z720 Tobacco use: Secondary | ICD-10-CM

## 2014-09-29 DIAGNOSIS — F17200 Nicotine dependence, unspecified, uncomplicated: Secondary | ICD-10-CM

## 2014-09-29 DIAGNOSIS — R001 Bradycardia, unspecified: Secondary | ICD-10-CM

## 2014-09-29 MED ORDER — BLOOD PRESSURE MONITORING KIT: 1.0000 | PACK | Status: AC

## 2014-09-29 MED ORDER — LISINOPRIL 2.5 MG PO TABS: 2.5000 mg | ORAL_TABLET | Freq: Two times a day (BID) | ORAL | Status: DC

## 2014-09-29 NOTE — Assessment & Plan Note (Signed)
Blood pressure relatively low today. Patient has felt poorly, thinks he may be coming down with a "cold." Appetite has been limited and he has been fatigued. I asked him to hold lisinopril for now. If symptoms improve, can resume later. Keep follow-up with primary care provider if symptoms worsen.

## 2014-09-29 NOTE — Patient Instructions (Signed)
Your physician recommends that you schedule a follow-up appointment in: 3 months. Your physician has recommended you make the following change in your medication:  Hold lisinopril for a week. If your systolic bp is >100, restart your lisinopril. Continue all other medications the same. Please monitor your blood pressure at home.

## 2014-09-29 NOTE — Assessment & Plan Note (Signed)
Still unable to quit smoking completely.

## 2014-09-29 NOTE — Telephone Encounter (Signed)
Patient called requesting a prescription for BP monitor be faxed to [facility]. Nurse advised patient that a rx would be sent.

## 2014-09-29 NOTE — Assessment & Plan Note (Signed)
Relatively stable with a few episodes of breakthrough angina that resolved with a single nitroglycerin. Plan to continue medical therapy and observation for now. Follow-up arranged in the next 3 months.

## 2014-09-29 NOTE — Assessment & Plan Note (Signed)
Continues on Pravachol with good control of LDL.

## 2014-09-29 NOTE — Progress Notes (Signed)
Reason for visit: CAD, hypertension, hyperlipidemia  Clinical Summary [name] [name] is a 48 y.o.male last seen in September. He presents for a routine visit today. States that he has been under emotional stress due to family matters. Has had a few episodes of angina that resolved with a single nitroglycerin earlier in the month. Nothing recently. Thinks that he may be coming down with a "cold." He has been staying in the bed the last few days, appetite has been decreased. No fevers or chills.  We reviewed his cardiac medications, he reports compliance. Remains on DAPT. Blood pressure is relatively low today.  Cardiac catheterization performed by Dr. Herbie Baltimore on September 11 demonstrated ostial occlusion of the LAD with diffusely diseased distal LAD that fills via patent LIMA, 80% proximal circumflex, 95% OM1/ramus, 20% in-stent restenosis within the SVG to OM1/ramus with severe distal stenosis of 95% within the graft segment, occluded mid RCA with some left-to-right collateralization, known occlusion of the SVG to PDA. Patient underwent placement of DES to the distal SVG to OM1/ramus system.  Lipid panel from earlier in the year showed total cholesterol 113, triglycerides 204, HDL 25, and LDL 47. He continues on Pravachol.   No Known Allergies  Current Outpatient Prescriptions  Medication Sig Dispense Refill  . acetaminophen (TYLENOL) 325 MG tablet Take 2 tablets (650 mg total) by mouth every 4 (four) hours as needed for headache or mild pain.    Marland Kitchen albuterol (PROVENTIL HFA;VENTOLIN HFA) 108 (90 BASE) MCG/ACT inhaler Inhale 2 puffs into the lungs every 6 (six) hours as needed for wheezing or shortness of breath.    Marland Kitchen aspirin EC 81 MG tablet Take 1 tablet (81 mg total) by mouth daily.    . carvedilol (COREG) 6.25 MG tablet Take 1 tablet (6.25 mg total) by mouth 2 (two) times daily with a meal. 60 tablet 6  . clopidogrel (PLAVIX) 75 MG tablet Take 1 tablet (75 mg total) by mouth daily. 30 tablet 11   . diazepam (VALIUM) 10 MG tablet Take 10 mg by mouth 2 (two) times daily.    . furosemide (LASIX) 20 MG tablet Take 20 mg by mouth at bedtime.    . gabapentin (NEURONTIN) 300 MG capsule Take 1 capsule (300 mg total) by mouth 3 (three) times daily.    Marland Kitchen HYDROcodone-acetaminophen (NORCO) 7.5-325 MG per tablet Take 1 tablet by mouth every 6 (six) hours as needed for moderate pain.    . isosorbide mononitrate (IMDUR) 60 MG 24 hr tablet Take 1 tablet (60 mg total) by mouth 2 (two) times daily. 180 tablet 3  . lisinopril (PRINIVIL,ZESTRIL) 2.5 MG tablet Take 1 tablet (2.5 mg total) by mouth 2 (two) times daily. 180 tablet 3  . NITROSTAT 0.4 MG SL tablet PLACE 1 TABLET UNDER THE TONGUE EVERY 5  MINUTES FOR 3  DOSES AS NEEDED FOR CHEST PAIN. 25 tablet 3  . Omega-3 Fatty Acids (FISH OIL) 1000 MG CAPS Take 1,000 mg by mouth 3 (three) times daily.     . pantoprazole (PROTONIX) 40 MG tablet Take 40 mg by mouth daily.     . pravastatin (PRAVACHOL) 80 MG tablet Take 1 tablet (80 mg total) by mouth every evening. 90 tablet 3  . RANEXA 500 MG 12 hr tablet TAKE 1 TABLET BY MOUTH TWO TIMES DAILY 120 tablet 2   No current facility-administered medications for this visit.    Past Medical History  Diagnosis Date  . Coronary atherosclerosis of native coronary artery  a. CABG 2003. b. NSTEMI 2010 s/p BMS to SVG-RCA c. PTCA/stent/asp thromb to SVG-RCA 01/2010, PTCA to SVG-RCA 04/2010 d. known occlusion of SVG-RCA 2013. e. NSTEMI 07/2013  s/p PTCA/DES to SVG-diagonal/intermediate/OM1.   . Mixed hyperlipidemia   . Bipolar disorder   . GERD (gastroesophageal reflux disease)   . Gout   . Chronic back pain   . Tobacco abuse   . Ischemic cardiomyopathy     a. EF 35-40% by cath 07/2013.  . Sinus bradycardia   . CHF (congestive heart failure)   . Hypertension   . Asthma     Past Surgical History  Procedure Laterality Date  . Cholecystectomy    . Coronary artery bypass graft      2003, LIMA to LAD; SVG to  diagonal; SVG to OM, ramus, and diagonal; SVG to RCA  . Pin placement left leg    . Metal plate to chin    . Left heart catheterization with coronary/graft angiogram N/A 07/14/2013    Procedure: LEFT HEART CATHETERIZATION WITH Isabel CapriceORONARY/GRAFT ANGIOGRAM;  Surgeon: Kathleene Hazelhristopher D McAlhany, MD;  [facility];  Service: Cardiovascular;  Laterality: N/A;  . Percutaneous coronary stent intervention (pci-s)  07/14/2013    Procedure: PERCUTANEOUS CORONARY STENT INTERVENTION (PCI-S);  Surgeon: Kathleene Hazelhristopher D McAlhany, MD;  [facility];  Service: Cardiovascular;;  . Left heart catheterization with coronary/graft angiogram N/A 12/29/2013    Procedure: LEFT HEART CATHETERIZATION WITH Isabel CapriceORONARY/GRAFT ANGIOGRAM;  Surgeon: Marykay Lexavid W Harding, MD;  [facility];  Service: Cardiovascular;  Laterality: N/A;  . Left heart catheterization with coronary/graft angiogram N/A 06/19/2014    Procedure: LEFT HEART CATHETERIZATION WITH Isabel CapriceORONARY/GRAFT ANGIOGRAM;  Surgeon: Marykay Lexavid W Harding, MD;  [facility];  Service: Cardiovascular;  Laterality: N/A;  . Cardiac catheterization  06/19/2014    Procedure: CORONARY STENT INTERVENTION;  Surgeon: Marykay Lexavid W Harding, MD;  [facility];  Service: Cardiovascular;;  DES to seg SVG Diag/OM/Ramus     Social History [name] [name] reports that he has been smoking Cigarettes.  He started smoking about 36 years ago. He has a 26.4 pack-year smoking history. He has never used smokeless tobacco. [name] [name] reports that he does not drink alcohol.  Review of Systems Complete review of systems negative except as otherwise outlined in the clinical summary and also the following. No palpitations or syncope.  Physical Examination Filed Vitals:   09/29/14 0821  BP: 91/55  Pulse: 58   Filed Weights   09/29/14 0821  Weight: 217 lb (98.431 kg)    Overweight male no distress.  HEENT: Conjunctiva and lids normal, oropharynx clear with poor dentition.  Neck:  Supple, no elevated JVP or carotid bruits, no thyromegaly.  Lungs: Clear to auscultation, nonlabored breathing at rest.  Cardiac: Regular rate and rhythm, no S3, no pericardial rub.  Abdomen: Soft, nontender, bowel sounds present.  Extremities: No pitting edema, distal pulses 2+.    Problem List and Plan   Coronary atherosclerosis of native coronary artery Relatively stable with a few episodes of breakthrough angina that resolved with a single nitroglycerin. Plan to continue medical therapy and observation for now. Follow-up arranged in the next 3 months.  Essential hypertension, benign Blood pressure relatively low today. Patient has felt poorly, thinks he may be coming down with a "cold." Appetite has been limited and he has been fatigued. I asked him to hold lisinopril for now. If symptoms improve, can resume later. Keep follow-up with primary care provider if symptoms worsen.  Mixed hyperlipidemia Continues on Pravachol with good control of LDL.  TOBACCO USER Still unable to quit smoking completely.    Jonelle Sidle, M.D., F.A.C.C.

## 2014-10-15 ENCOUNTER — Other Ambulatory Visit: Payer: Self-pay | Admitting: *Deleted

## 2014-10-15 MED ORDER — PRAVASTATIN SODIUM 80 MG PO TABS: 80.0000 mg | ORAL_TABLET | Freq: Every evening | ORAL | Status: DC

## 2014-10-16 ENCOUNTER — Telehealth: Payer: Self-pay | Admitting: Cardiology

## 2014-10-16 NOTE — Telephone Encounter (Signed)
Patient informed that taking meformin is okay to take. Patient also advised to contact his PCP about his night time sob. Patient informed if his symptoms got worse to proceed to the ED for an evaluation.

## 2014-10-16 NOTE — Telephone Encounter (Signed)
Patient was placed on Metformin 1000mg  for elevated blood sugar. Concerned about taking medication with heart issues. States he is having some discomfort since yesterday. States when he goes to bed he noticed some shortness of breath.

## 2014-11-13 ENCOUNTER — Other Ambulatory Visit: Payer: Self-pay | Admitting: Cardiology

## 2014-12-04 ENCOUNTER — Other Ambulatory Visit: Payer: Self-pay | Admitting: *Deleted

## 2014-12-04 MED ORDER — FUROSEMIDE 20 MG PO TABS: 20.0000 mg | ORAL_TABLET | Freq: Every day | ORAL | Status: DC

## 2014-12-30 ENCOUNTER — Encounter: Payer: Self-pay | Admitting: Cardiology

## 2014-12-30 ENCOUNTER — Ambulatory Visit (INDEPENDENT_AMBULATORY_CARE_PROVIDER_SITE_OTHER): Payer: Medicare Other | Admitting: Cardiology

## 2014-12-30 VITALS — BP 100/68 | HR 57 | Ht 64.0 in | Wt 212.0 lb

## 2014-12-30 DIAGNOSIS — I25119 Atherosclerotic heart disease of native coronary artery with unspecified angina pectoris: Secondary | ICD-10-CM

## 2014-12-30 DIAGNOSIS — E782 Mixed hyperlipidemia: Secondary | ICD-10-CM

## 2014-12-30 DIAGNOSIS — I10 Essential (primary) hypertension: Secondary | ICD-10-CM | POA: Diagnosis not present

## 2014-12-30 DIAGNOSIS — F17200 Nicotine dependence, unspecified, uncomplicated: Secondary | ICD-10-CM

## 2014-12-30 DIAGNOSIS — Z720 Tobacco use: Secondary | ICD-10-CM

## 2014-12-30 NOTE — Progress Notes (Signed)
Cardiology Office Note  Date: 12/30/2014   ID: [name], DOB 1966/01/30, MRN [id number]  PCP: [name], MD  Primary Cardiologist: [name], MD   Chief Complaint  Patient presents with  . Coronary Artery Disease  . Cardiomyopathy    History of Present Illness: [name] [name] is a 49 y.o. male last seen in December 2015. He presents for a routine follow-up visit today. He tells me that his angina has been much less frequent, largely attributes this to less stress and anxiety. He states that he has been having some marital difficulties, however things are "smoothing out."  We reviewed his cardiac medications, there have been no major changes. Blood pressure and heart rate remain well controlled. He just recently got a home blood pressure monitor.  Cardiac catheterization performed by Dr. Ellyn Hack in September 2015 demonstrated ostial occlusion of the LAD with diffusely diseased distal LAD that fills via patent LIMA, 80% proximal circumflex, 95% OM1/ramus, 20% in-stent restenosis within the SVG to OM1/ramus with severe distal stenosis of 95% within the graft segment, occluded mid RCA with some left-to-right collateralization, known occlusion of the SVG to PDA. Patient underwent placement of DES to the distal SVG to OM1/ramus system.   Past Medical History  Diagnosis Date  . Coronary atherosclerosis of native coronary artery     a. CABG 2003. b. NSTEMI 2010 s/p BMS to SVG-RCA c. PTCA/stent/asp thromb to SVG-RCA 01/2010, PTCA to SVG-RCA 04/2010 d. known occlusion of SVG-RCA 2013. e. NSTEMI 07/2013  s/p PTCA/DES to SVG-diagonal/intermediate/OM1.   . Mixed hyperlipidemia   . Bipolar disorder   . GERD (gastroesophageal reflux disease)   . Gout   . Chronic back pain   . Ischemic cardiomyopathy     a. EF 35-40% by cath 07/2013.  . Sinus bradycardia   . Essential hypertension   . Asthma     Past Surgical History  Procedure Laterality Date  . Cholecystectomy    .  Coronary artery bypass graft      2003, LIMA to LAD; SVG to diagonal; SVG to OM, ramus, and diagonal; SVG to RCA  . Pin placement left leg    . Metal plate to chin    . Left heart catheterization with coronary/graft angiogram N/A 07/14/2013    Procedure: LEFT HEART CATHETERIZATION WITH Beatrix Fetters;  Surgeon: Burnell Blanks, MD;  [facility] LAB;  Service: Cardiovascular;  Laterality: N/A;  . Percutaneous coronary stent intervention (pci-s)  07/14/2013    Procedure: PERCUTANEOUS CORONARY STENT INTERVENTION (PCI-S);  Surgeon: Burnell Blanks, MD;  Location: [facility] CATH LAB;  Service: Cardiovascular;;  . Left heart catheterization with coronary/graft angiogram N/A 12/29/2013    Procedure: LEFT HEART CATHETERIZATION WITH Beatrix Fetters;  Surgeon: Leonie Man, MD;  Location: [facility] CATH LAB;  Service: Cardiovascular;  Laterality: N/A;  . Left heart catheterization with coronary/graft angiogram N/A 06/19/2014    Procedure: LEFT HEART CATHETERIZATION WITH Beatrix Fetters;  Surgeon: Leonie Man, MD;  Location: [facility] CATH LAB;  Service: Cardiovascular;  Laterality: N/A;  . Cardiac catheterization  06/19/2014    Procedure: CORONARY STENT INTERVENTION;  Surgeon: Leonie Man, MD;  Location: [facility] CATH LAB;  Service: Cardiovascular;;  DES to seg SVG Diag/OM/Ramus     Current Outpatient Prescriptions  Medication Sig Dispense Refill  . acetaminophen (TYLENOL) 325 MG tablet Take 2 tablets (650 mg total) by mouth every 4 (four) hours as needed for headache or mild pain.    Marland Kitchen albuterol (PROVENTIL  HFA;VENTOLIN HFA) 108 (90 BASE) MCG/ACT inhaler Inhale 2 puffs into the lungs every 6 (six) hours as needed for wheezing or shortness of breath.    Marland Kitchen aspirin EC 81 MG tablet Take 1 tablet (81 mg total) by mouth daily.    . Blood Pressure Monitoring KIT 1 each by Does not apply route as directed. 1 kit 0  . carvedilol (COREG) 6.25 MG tablet Take 1 tablet (6.25 mg total) by  mouth 2 (two) times daily with a meal. 60 tablet 6  . clopidogrel (PLAVIX) 75 MG tablet Take 1 tablet (75 mg total) by mouth daily. 30 tablet 11  . diazepam (VALIUM) 10 MG tablet Take 10 mg by mouth 2 (two) times daily.    . furosemide (LASIX) 20 MG tablet Take 1 tablet (20 mg total) by mouth at bedtime. 30 tablet 6  . gabapentin (NEURONTIN) 300 MG capsule Take 1 capsule (300 mg total) by mouth 3 (three) times daily.    Marland Kitchen HYDROcodone-acetaminophen (NORCO) 7.5-325 MG per tablet Take 1 tablet by mouth every 6 (six) hours as needed for moderate pain.    . isosorbide mononitrate (IMDUR) 60 MG 24 hr tablet TAKE 1 TABLET BY MOUTH TWO TIMES DAILY 180 tablet 3  . lisinopril (PRINIVIL,ZESTRIL) 2.5 MG tablet Take 1 tablet (2.5 mg total) by mouth 2 (two) times daily. 180 tablet 3  . metFORMIN (GLUCOPHAGE) 1000 MG tablet Take 1,000 mg by mouth 2 (two) times daily with a meal.    . methocarbamol (ROBAXIN) 750 MG tablet Take 750 mg by mouth every 8 (eight) hours as needed for muscle spasms.    Marland Kitchen NITROSTAT 0.4 MG SL tablet PLACE 1 TABLET UNDER THE TONGUE EVERY 5  MINUTES FOR 3  DOSES AS NEEDED FOR CHEST PAIN. 25 tablet 3  . Omega-3 Fatty Acids (FISH OIL) 1000 MG CAPS Take 1,000 mg by mouth 3 (three) times daily.     . pantoprazole (PROTONIX) 40 MG tablet Take 40 mg by mouth daily.     . pravastatin (PRAVACHOL) 80 MG tablet Take 1 tablet (80 mg total) by mouth every evening. 90 tablet 3  . RANEXA 500 MG 12 hr tablet TAKE 1 TABLET BY MOUTH TWO TIMES DAILY 120 tablet 2   No current facility-administered medications for this visit.    Allergies:  Review of patient's allergies indicates no known allergies.   Social History: The patient  reports that he has been smoking Cigarettes.  He started smoking about 36 years ago. He has a 26.4 pack-year smoking history. He has never used smokeless tobacco. He reports that he uses illicit drugs. He reports that he does not drink alcohol.   ROS:  Please see the history of  present illness. Otherwise, complete review of systems is positive for none.  All other systems are reviewed and negative.   Physical Exam: VS:  BP 100/68 mmHg  Pulse 57  Ht 5' 4" (1.626 m)  Wt 212 lb (96.163 kg)  BMI 36.37 kg/m2  SpO2 94%, BMI Body mass index is 36.37 kg/(m^2).  Wt Readings from Last 3 Encounters:  12/30/14 212 lb (96.163 kg)  09/29/14 217 lb (98.431 kg)  07/01/14 212 lb (96.163 kg)     Overweight male no distress.  HEENT: Conjunctiva and lids normal, oropharynx clear with poor dentition.  Neck: Supple, no elevated JVP or carotid bruits, no thyromegaly.  Lungs: Clear to auscultation, nonlabored breathing at rest.  Cardiac: Regular rate and rhythm, no S3, no pericardial rub.  Abdomen:  Soft, nontender, bowel sounds present.  Extremities: No pitting edema, distal pulses 2+.    ECG: ECG is not ordered today.   Recent Labwork: 04/05/2014: ALT 19; AST 19 06/20/2014: BUN 9; Creatinine 0.79; Hemoglobin 14.4; Platelets 190; Potassium 4.2; Sodium 140     Component Value Date/Time   CHOL 113 12/28/2013 0530   TRIG 204* 12/28/2013 0530   HDL 25* 12/28/2013 0530   CHOLHDL 4.5 12/28/2013 0530   VLDL 41* 12/28/2013 0530   LDLCALC 47 12/28/2013 0530    Assessment and Plan:  1. Multivessel CAD and graft disease as outlined above, clinically stable with most recent intervention being DES to the distal SVG to obtuse marginal/ramus system. Continue current medical regimen and observation.  2. Ongoing tobacco abuse. We continue to discuss smoking cessation. He has not been able to quit.  3. Hyperlipidemia, on Pravachol.  4. Essential hypertension, blood pressure well controlled.  Current medicines were reviewed with the patient today.   Disposition: FU with me in 4 months.   Signed, Samuel G. McDowell, MD, FACC 12/30/2014 11:18 AM    [facility] [place] [address], [place], [place] [address] Phone: [phone]; Fax: [phone]  

## 2014-12-30 NOTE — Patient Instructions (Signed)
Continue all current medications. Follow up in  4 months  

## 2015-01-15 ENCOUNTER — Observation Stay (HOSPITAL_COMMUNITY)
Admission: EM | Admit: 2015-01-15 | Discharge: 2015-01-16 | Disposition: A | Discharge status: Home / Self Care | Payer: Medicare Other | Attending: Internal Medicine | Admitting: Internal Medicine

## 2015-01-15 ENCOUNTER — Emergency Department (HOSPITAL_COMMUNITY): Payer: Medicare Other

## 2015-01-15 ENCOUNTER — Encounter (HOSPITAL_COMMUNITY): Payer: Self-pay | Admitting: *Deleted

## 2015-01-15 DIAGNOSIS — Z7982 Long term (current) use of aspirin: Secondary | ICD-10-CM | POA: Diagnosis not present

## 2015-01-15 DIAGNOSIS — Z720 Tobacco use: Secondary | ICD-10-CM | POA: Diagnosis not present

## 2015-01-15 DIAGNOSIS — E782 Mixed hyperlipidemia: Secondary | ICD-10-CM | POA: Diagnosis not present

## 2015-01-15 DIAGNOSIS — M109 Gout, unspecified: Secondary | ICD-10-CM | POA: Diagnosis not present

## 2015-01-15 DIAGNOSIS — R079 Chest pain, unspecified: Secondary | ICD-10-CM | POA: Diagnosis present

## 2015-01-15 DIAGNOSIS — J45901 Unspecified asthma with (acute) exacerbation: Secondary | ICD-10-CM | POA: Diagnosis not present

## 2015-01-15 DIAGNOSIS — Z9861 Coronary angioplasty status: Secondary | ICD-10-CM | POA: Insufficient documentation

## 2015-01-15 DIAGNOSIS — I208 Other forms of angina pectoris: Secondary | ICD-10-CM | POA: Diagnosis present

## 2015-01-15 DIAGNOSIS — I252 Old myocardial infarction: Secondary | ICD-10-CM | POA: Insufficient documentation

## 2015-01-15 DIAGNOSIS — M25522 Pain in left elbow: Secondary | ICD-10-CM | POA: Diagnosis not present

## 2015-01-15 DIAGNOSIS — F319 Bipolar disorder, unspecified: Secondary | ICD-10-CM | POA: Insufficient documentation

## 2015-01-15 DIAGNOSIS — F17200 Nicotine dependence, unspecified, uncomplicated: Secondary | ICD-10-CM | POA: Diagnosis present

## 2015-01-15 DIAGNOSIS — E119 Type 2 diabetes mellitus without complications: Secondary | ICD-10-CM | POA: Diagnosis not present

## 2015-01-15 DIAGNOSIS — K219 Gastro-esophageal reflux disease without esophagitis: Secondary | ICD-10-CM | POA: Insufficient documentation

## 2015-01-15 DIAGNOSIS — I25119 Atherosclerotic heart disease of native coronary artery with unspecified angina pectoris: Secondary | ICD-10-CM | POA: Diagnosis not present

## 2015-01-15 DIAGNOSIS — I255 Ischemic cardiomyopathy: Secondary | ICD-10-CM | POA: Diagnosis not present

## 2015-01-15 DIAGNOSIS — Z7902 Long term (current) use of antithrombotics/antiplatelets: Secondary | ICD-10-CM | POA: Diagnosis not present

## 2015-01-15 DIAGNOSIS — Z79899 Other long term (current) drug therapy: Secondary | ICD-10-CM | POA: Diagnosis not present

## 2015-01-15 DIAGNOSIS — I2089 Other forms of angina pectoris: Secondary | ICD-10-CM | POA: Diagnosis present

## 2015-01-15 DIAGNOSIS — Z951 Presence of aortocoronary bypass graft: Secondary | ICD-10-CM | POA: Insufficient documentation

## 2015-01-15 LAB — BASIC METABOLIC PANEL
Anion gap: 11 (ref 5–15)
BUN: 9 mg/dL (ref 6–23)
CHLORIDE: 101 mmol/L (ref 96–112)
CO2: 25 mmol/L (ref 19–32)
Calcium: 8.7 mg/dL (ref 8.4–10.5)
Creatinine, Ser: 0.84 mg/dL (ref 0.50–1.35)
GFR calc Af Amer: >90 mL/min (ref >90)
GFR calc non Af Amer: >90 mL/min (ref >90)
GLUCOSE: 110 mg/dL — AB (ref 70–99)
Potassium: 3.6 mmol/L (ref 3.5–5.1)
Sodium: 137 mmol/L (ref 135–145)

## 2015-01-15 LAB — CBC WITH DIFFERENTIAL/PLATELET
Basophils Absolute: 0 10*3/uL (ref 0.0–0.1)
Basophils Relative: 0 % (ref 0–1)
EOS PCT: 1 % (ref 0–5)
Eosinophils Absolute: 0.1 10*3/uL (ref 0.0–0.7)
HCT: 41.9 % (ref 39.0–52.0)
Hemoglobin: 14.4 g/dL (ref 13.0–17.0)
LYMPHS ABS: 2.9 10*3/uL (ref 0.7–4.0)
LYMPHS PCT: 34 % (ref 12–46)
MCH: 34.2 pg — ABNORMAL HIGH (ref 26.0–34.0)
MCHC: 34.4 g/dL (ref 30.0–36.0)
MCV: 99.5 fL (ref 78.0–100.0)
MONO ABS: 0.6 10*3/uL (ref 0.1–1.0)
Monocytes Relative: 7 % (ref 3–12)
Neutro Abs: 5 10*3/uL (ref 1.7–7.7)
Neutrophils Relative %: 58 % (ref 43–77)
Platelets: 182 10*3/uL (ref 150–400)
RBC: 4.21 MIL/uL — AB (ref 4.22–5.81)
RDW: 13.2 % (ref 11.5–15.5)
WBC: 8.5 10*3/uL (ref 4.0–10.5)

## 2015-01-15 LAB — TROPONIN I

## 2015-01-15 MED ORDER — SODIUM CHLORIDE 0.9 % IV SOLN: INTRAVENOUS | Status: DC

## 2015-01-15 NOTE — ED Provider Notes (Signed)
CSN: [id number]     Arrival date & time 01/15/15  2150 History  This chart was scribe for Rolland Porter, MD by Judithann Sauger, ED Scribe. The patient was seen in room APA02/APA02 and the patient's care was started at 11:18 PM.     Chief Complaint  Patient presents with  . Chest Pain   The history is provided by the patient. No language interpreter was used.   HPI Comments: [name] [name] is a 49 y.o. male who presents to the Emergency Department complaining of intermittent mid to left chest tightness that radiates to his left elbow onset a week before [birth year] which [birth year] his [birth year]. He explains that his chest tightness is daily and usually lasts about 2 minutes. He adds that today he had 4 episodes between 1:30 and 2:15 while he was putting flooring in the back of his truck. He states the pain would last a few minutes and when he stopped and rested it went away however it then came back several minutes later. He reports associated nausea, SOB, and diaphoresis. He denies leg swelling. He reports that the last time he had a cardiac cath done was 09/15 and he did have a stent placed at that time. Patient does not know how many stents he has. Patient is also status post bypass surgery in 2003. He states that when he sits, his chest tightness is better but worse when he is up and moving. He however does sometimes have chest pain at rest. He reports that a few days ago, he used NTG for the first time while walking around [place] and that CP lasted about 10 minutes. He states he normally does not like to use nitroglycerin.  He called his cardiologist this afternoon after he had the 4 episodes of chest pain and was advised to come to the ED, however he reports that he waited to come in because "he does not like hospitals and he is stubborn". He reports that he saw Dr. Domenic Polite on 12/30/14 for these symptoms and was told to stop smoking.   PCP Dr Sherrie Sport in [facility] Dr Domenic Polite  Past Medical History   Diagnosis Date  . Coronary atherosclerosis of native coronary artery     a. CABG 2003. b. NSTEMI 2010 s/p BMS to SVG-RCA c. PTCA/stent/asp thromb to SVG-RCA 01/2010, PTCA to SVG-RCA 04/2010 d. known occlusion of SVG-RCA 2013. e. NSTEMI 07/2013  s/p PTCA/DES to SVG-diagonal/intermediate/OM1.   . Mixed hyperlipidemia   . Bipolar disorder   . GERD (gastroesophageal reflux disease)   . Gout   . Chronic back pain   . Ischemic cardiomyopathy     a. EF 35-40% by cath 07/2013.  . Sinus bradycardia   . Essential hypertension   . Asthma   . Diabetes mellitus without complication    Past Surgical History  Procedure Laterality Date  . Cholecystectomy    . Coronary artery bypass graft      2003, LIMA to LAD; SVG to diagonal; SVG to OM, ramus, and diagonal; SVG to RCA  . Pin placement left leg    . Metal plate to chin    . Left heart catheterization with coronary/graft angiogram N/A 07/14/2013    Procedure: LEFT HEART CATHETERIZATION WITH Beatrix Fetters;  Surgeon: Burnell Blanks, MD;  Location: [facility] CATH LAB;  Service: Cardiovascular;  Laterality: N/A;  . Percutaneous coronary stent intervention (pci-s)  07/14/2013    Procedure: PERCUTANEOUS CORONARY STENT INTERVENTION (PCI-S);  Surgeon: Burnell Blanks, MD;  Location:  [place] CATH LAB;  Service: Cardiovascular;;  . Left heart catheterization with coronary/graft angiogram N/A 12/29/2013    Procedure: LEFT HEART CATHETERIZATION WITH Beatrix Fetters;  Surgeon: Leonie Man, MD;  Location: [facility] CATH LAB;  Service: Cardiovascular;  Laterality: N/A;  . Left heart catheterization with coronary/graft angiogram N/A 06/19/2014    Procedure: LEFT HEART CATHETERIZATION WITH Beatrix Fetters;  Surgeon: Leonie Man, MD;  Location: [facility] CATH LAB;  Service: Cardiovascular;  Laterality: N/A;  . Cardiac catheterization  06/19/2014    Procedure: CORONARY STENT INTERVENTION;  Surgeon: Leonie Man, MD;  Location: [facility] CATH LAB;   Service: Cardiovascular;;  DES to seg SVG Diag/OM/Ramus    Family History  Problem Relation Age of Onset  . Stroke      family Hx    History  Substance Use Topics  . Smoking status: Current Every Day Smoker -- 0.80 packs/day for 33 years    Types: Cigarettes    Start date: 07/01/1978  . Smokeless tobacco: Never Used     Comment: trying to quit, 3/4 pack per day. Started the nicotine patch on 01-10-14  . Alcohol Use: No     Comment: drink a can of beer occasionally every year   on disability for bipolar disease occasional ETOH Lives with spouse Smokes 1 ppd  Review of Systems  Constitutional: Positive for diaphoresis.  Respiratory: Positive for chest tightness and shortness of breath.   Gastrointestinal: Positive for nausea.  Musculoskeletal: Positive for arthralgias (left elbow). Negative for joint swelling.  All other systems reviewed and are negative.     Allergies  Review of patient's allergies indicates no known allergies.  Home Medications   Prior to Admission medications   Medication Sig Start Date End Date Taking? Authorizing Provider  albuterol (PROVENTIL HFA;VENTOLIN HFA) 108 (90 BASE) MCG/ACT inhaler Inhale 2 puffs into the lungs every 6 (six) hours as needed for wheezing or shortness of breath.   Yes Historical Provider, MD  aspirin EC 81 MG tablet Take 1 tablet (81 mg total) by mouth daily. 12/05/12  Yes Donney Dice, PA-C  carvedilol (COREG) 6.25 MG tablet Take 1 tablet (6.25 mg total) by mouth 2 (two) times daily with a meal. 07/21/14  Yes Satira Sark, MD  clopidogrel (PLAVIX) 75 MG tablet Take 1 tablet (75 mg total) by mouth daily. 06/17/14  Yes Satira Sark, MD  diazepam (VALIUM) 10 MG tablet Take 10 mg by mouth 2 (two) times daily.   Yes Historical Provider, MD  furosemide (LASIX) 20 MG tablet Take 1 tablet (20 mg total) by mouth at bedtime. 12/04/14  Yes Satira Sark, MD  gabapentin (NEURONTIN) 300 MG capsule Take 1 capsule (300 mg total) by  mouth 3 (three) times daily. 08/04/14  Yes Satira Sark, MD  HYDROcodone-acetaminophen (NORCO) 7.5-325 MG per tablet Take 1 tablet by mouth every 6 (six) hours as needed for moderate pain.   Yes Historical Provider, MD  isosorbide mononitrate (IMDUR) 60 MG 24 hr tablet TAKE 1 TABLET BY MOUTH TWO TIMES DAILY 11/13/14  Yes Satira Sark, MD  lisinopril (PRINIVIL,ZESTRIL) 2.5 MG tablet Take 1 tablet (2.5 mg total) by mouth 2 (two) times daily. 09/29/14  Yes Satira Sark, MD  metFORMIN (GLUCOPHAGE) 1000 MG tablet Take 1,000 mg by mouth 2 (two) times daily with a meal.   Yes Historical Provider, MD  methocarbamol (ROBAXIN) 750 MG tablet Take 750 mg by mouth every 8 (eight) hours.    Yes Historical Provider,  MD  NITROSTAT 0.4 MG SL tablet PLACE 1 TABLET UNDER THE TONGUE EVERY 5  MINUTES FOR 3  DOSES AS NEEDED FOR CHEST PAIN. 09/10/14  Yes Satira Sark, MD  Omega-3 Fatty Acids (FISH OIL) 1000 MG CAPS Take 1,000-2,000 mg by mouth 2 (two) times daily. 1 IN THE MORNING AND 2 AT BEDTIME   Yes Historical Provider, MD  pantoprazole (PROTONIX) 40 MG tablet Take 40 mg by mouth daily.    Yes Historical Provider, MD  pravastatin (PRAVACHOL) 80 MG tablet Take 1 tablet (80 mg total) by mouth every evening. 10/15/14  Yes Satira Sark, MD  RANEXA 500 MG 12 hr tablet TAKE 1 TABLET BY MOUTH TWO TIMES DAILY 07/23/14  Yes Satira Sark, MD  acetaminophen (TYLENOL) 325 MG tablet Take 2 tablets (650 mg total) by mouth every 4 (four) hours as needed for headache or mild pain. Patient not taking: Reported on 01/15/2015 06/20/14   Erlene Quan, PA-C  Blood Pressure Monitoring KIT 1 each by Does not apply route as directed. 09/29/14   Satira Sark, MD   BP 114/74 mmHg  Pulse 65  Temp(Src) 98.5 F (36.9 C) (Oral)  Resp 20  Ht _0  (1.626 m)  Wt 212 lb (96.163 kg)  BMI 36.37 kg/m2  SpO2 96%  Vital signs normal   Physical Exam  Constitutional: He is oriented to person, place, and time. He  appears well-developed and well-nourished.  Non-toxic appearance. He does not appear ill. No distress.  HENT:  Head: Normocephalic and atraumatic.  Right Ear: External ear normal.  Left Ear: External ear normal.  Nose: Nose normal. No mucosal edema or rhinorrhea.  Mouth/Throat: Oropharynx is clear and moist and mucous membranes are normal. No dental abscesses or uvula swelling.  Eyes: Conjunctivae and EOM are normal. Pupils are equal, round, and reactive to light.  Neck: Normal range of motion and full passive range of motion without pain. Neck supple.  Cardiovascular: Normal rate, regular rhythm and normal heart sounds.  Exam reveals no gallop and no friction rub.   No murmur heard. Pulmonary/Chest: Effort normal and breath sounds normal. No respiratory distress. He has no wheezes. He has no rhonchi. He has no rales. He exhibits no tenderness and no crepitus.  Abdominal: Soft. Normal appearance and bowel sounds are normal. He exhibits no distension. There is no tenderness. There is no rebound and no guarding.  Musculoskeletal: Normal range of motion. He exhibits no edema or tenderness.  Moves all extremities well.   Neurological: He is alert and oriented to person, place, and time. He has normal strength. No cranial nerve deficit.  Skin: Skin is warm, dry and intact. No rash noted. No erythema. No pallor.  Psychiatric: He has a normal mood and affect. His speech is normal and behavior is normal. His mood appears not anxious.  Nursing note and vitals reviewed.   ED Course  Procedures (including critical care time)  Medications  0.9 %  sodium chloride infusion (not administered)  aspirin chewable tablet 324 mg (324 mg Oral Given 01/16/15 0053)  nitroGLYCERIN (NITROGLYN) 2 % ointment 1 inch (1 inch Topical Given 01/16/15 0054)    DIAGNOSTIC STUDIES: Oxygen Saturation is 98% on RA, normal by my interpretation.    COORDINATION OF CARE: 11:28 PM- Pt advised of plan for treatment and pt  agrees.   00:32 Dr Percival Spanish, cardiology has reviewed his chart, states he can stay here and transfer if he gets worse, states he has a  lot of small vessel disease, and unless his cardiac enzymes became positive they would just medical manage him at [facility].   01:11 Dr Darrick Meigs, admit to tele  Patient remained pain-free while in the ED.  Cardiac catheterization performed by Dr. Ellyn Hack in September 2015 demonstrated ostial occlusion of the LAD with diffusely diseased distal LAD that fills via patent LIMA, 80% proximal circumflex, 95% OM1/ramus, 20% in-stent restenosis within the SVG to OM1/ramus with severe distal stenosis of 95% within the graft segment, occluded mid RCA with some left-to-right collateralization, known occlusion of the SVG to PDA. Patient underwent placement of DES to the distal SVG to OM1/ramus system.   Labs Review Results for orders placed or performed during the hospital encounter of 01/15/15  Troponin I  Result Value Ref Range   Troponin I <0.03 <0.031 ng/mL  CBC with Differential/Platelet  Result Value Ref Range   WBC 8.5 4.0 - 10.5 K/uL   RBC 4.21 (L) 4.22 - 5.81 MIL/uL   Hemoglobin 14.4 13.0 - 17.0 g/dL   HCT 41.9 39.0 - 52.0 %   MCV 99.5 78.0 - 100.0 fL   MCH 34.2 (H) 26.0 - 34.0 pg   MCHC 34.4 30.0 - 36.0 g/dL   RDW 13.2 11.5 - 15.5 %   Platelets 182 150 - 400 K/uL   Neutrophils Relative % 58 43 - 77 %   Neutro Abs 5.0 1.7 - 7.7 K/uL   Lymphocytes Relative 34 12 - 46 %   Lymphs Abs 2.9 0.7 - 4.0 K/uL   Monocytes Relative 7 3 - 12 %   Monocytes Absolute 0.6 0.1 - 1.0 K/uL   Eosinophils Relative 1 0 - 5 %   Eosinophils Absolute 0.1 0.0 - 0.7 K/uL   Basophils Relative 0 0 - 1 %   Basophils Absolute 0.0 0.0 - 0.1 K/uL  Basic metabolic panel  Result Value Ref Range   Sodium 137 135 - 145 mmol/L   Potassium 3.6 3.5 - 5.1 mmol/L   Chloride 101 96 - 112 mmol/L   CO2 25 19 - 32 mmol/L   Glucose, Bld 110 (H) 70 - 99 mg/dL   BUN 9 6 - 23 mg/dL   Creatinine, Ser 0.84  0.50 - 1.35 mg/dL   Calcium 8.7 8.4 - 10.5 mg/dL   GFR calc non Af Amer >90 >90 mL/min   GFR calc Af Amer >90 >90 mL/min   Anion gap 11 5 - 15    Laboratory interpretation all normal   Imaging Review Dg Chest 2 View  01/15/2015   CLINICAL DATA:  Intermittent left-sided chest pain radiating down the left arm with nausea and shortness of breath.  EXAM: CHEST  2 VIEW  COMPARISON:  04/05/2014  FINDINGS: Postoperative changes in the mediastinum. Normal heart size and pulmonary vascularity. No focal airspace disease or consolidation in the lungs. No blunting of costophrenic angles. No pneumothorax. Mediastinal contours appear intact. Mild degenerative changes in the spine and shoulders.  IMPRESSION: No active cardiopulmonary disease.   Electronically Signed   By: Lucienne Capers M.D.   On: 01/15/2015 22:40     EKG Interpretation   Date/Time:  Friday January 15 2015 21:54:51 EDT Ventricular Rate:  69 PR Interval:  172 QRS Duration: 80 QT Interval:  386 QTC Calculation: 413 R Axis:   76 Text Interpretation:  Normal sinus rhythm Nonspecific T wave abnormality  Abnormal ECG No significant change since last tracing Confirmed by ALLEN   MD, ANTHONY ([id number]) on 01/15/2015 10:01:44 PM  MDM   Final diagnoses:  Exertional angina   Plan admission  Rolland Porter, MD, FACEP   I personally performed the services described in this documentation, which was scribed in my presence. The recorded information has been reviewed and considered.    Rolland Porter, MD 01/16/15 [phone]

## 2015-01-15 NOTE — ED Notes (Signed)
Chest pain since 3/3, worse tonight   With  Sob, and nausea.

## 2015-01-16 ENCOUNTER — Encounter (HOSPITAL_COMMUNITY): Payer: Self-pay

## 2015-01-16 DIAGNOSIS — R079 Chest pain, unspecified: Secondary | ICD-10-CM | POA: Diagnosis present

## 2015-01-16 DIAGNOSIS — I25119 Atherosclerotic heart disease of native coronary artery with unspecified angina pectoris: Secondary | ICD-10-CM | POA: Diagnosis not present

## 2015-01-16 DIAGNOSIS — E782 Mixed hyperlipidemia: Secondary | ICD-10-CM | POA: Diagnosis not present

## 2015-01-16 DIAGNOSIS — I208 Other forms of angina pectoris: Secondary | ICD-10-CM | POA: Diagnosis not present

## 2015-01-16 DIAGNOSIS — I2089 Other forms of angina pectoris: Secondary | ICD-10-CM | POA: Diagnosis present

## 2015-01-16 DIAGNOSIS — Z720 Tobacco use: Secondary | ICD-10-CM | POA: Diagnosis not present

## 2015-01-16 LAB — CBC
HCT: 40.4 % (ref 39.0–52.0)
Hemoglobin: 13.7 g/dL (ref 13.0–17.0)
MCH: 33.9 pg (ref 26.0–34.0)
MCHC: 33.9 g/dL (ref 30.0–36.0)
MCV: 100 fL (ref 78.0–100.0)
PLATELETS: 164 10*3/uL (ref 150–400)
RBC: 4.04 MIL/uL — AB (ref 4.22–5.81)
RDW: 13.2 % (ref 11.5–15.5)
WBC: 7.3 10*3/uL (ref 4.0–10.5)

## 2015-01-16 LAB — COMPREHENSIVE METABOLIC PANEL
ALBUMIN: 4.2 g/dL (ref 3.5–5.2)
ALT: 21 U/L (ref 0–53)
AST: 19 U/L (ref 0–37)
Alkaline Phosphatase: 42 U/L (ref 39–117)
Anion gap: 12 (ref 5–15)
BUN: 9 mg/dL (ref 6–23)
CALCIUM: 8.5 mg/dL (ref 8.4–10.5)
CO2: 25 mmol/L (ref 19–32)
Chloride: 100 mmol/L (ref 96–112)
Creatinine, Ser: 0.76 mg/dL (ref 0.50–1.35)
GFR calc Af Amer: >90 mL/min (ref >90)
GFR calc non Af Amer: >90 mL/min (ref >90)
Glucose, Bld: 107 mg/dL — ABNORMAL HIGH (ref 70–99)
Potassium: 3.9 mmol/L (ref 3.5–5.1)
Sodium: 137 mmol/L (ref 135–145)
Total Bilirubin: 0.4 mg/dL (ref 0.3–1.2)
Total Protein: 7.2 g/dL (ref 6.0–8.3)

## 2015-01-16 LAB — TROPONIN I

## 2015-01-16 LAB — MRSA PCR SCREENING: MRSA BY PCR: NEGATIVE

## 2015-01-16 MED ORDER — PANTOPRAZOLE SODIUM 40 MG PO TBEC
40.0000 mg | DELAYED_RELEASE_TABLET | Freq: Every day | ORAL | Status: DC
  Administered 2015-01-16: 40 mg via ORAL
  Filled 2015-01-16: qty 1

## 2015-01-16 MED ORDER — DIAZEPAM 5 MG PO TABS
10.0000 mg | ORAL_TABLET | Freq: Two times a day (BID) | ORAL | Status: DC
  Administered 2015-01-16 (×2): 10 mg via ORAL
  Filled 2015-01-16 (×2): qty 2

## 2015-01-16 MED ORDER — ASPIRIN EC 81 MG PO TBEC
81.0000 mg | DELAYED_RELEASE_TABLET | Freq: Every day | ORAL | Status: DC
  Administered 2015-01-16: 81 mg via ORAL
  Filled 2015-01-16: qty 1

## 2015-01-16 MED ORDER — ACETAMINOPHEN 325 MG PO TABS: 650.0000 mg | ORAL_TABLET | ORAL | Status: DC | PRN

## 2015-01-16 MED ORDER — CARVEDILOL 3.125 MG PO TABS
6.2500 mg | ORAL_TABLET | Freq: Two times a day (BID) | ORAL | Status: DC
  Administered 2015-01-16: 6.25 mg via ORAL
  Filled 2015-01-16 (×2): qty 2

## 2015-01-16 MED ORDER — NICOTINE 14 MG/24HR TD PT24
14.0000 mg | MEDICATED_PATCH | Freq: Every day | TRANSDERMAL | Status: DC
  Administered 2015-01-16: 14 mg via TRANSDERMAL
  Filled 2015-01-16: qty 1

## 2015-01-16 MED ORDER — RANOLAZINE ER 500 MG PO TB12
500.0000 mg | ORAL_TABLET | Freq: Two times a day (BID) | ORAL | Status: DC
  Administered 2015-01-16 (×2): 500 mg via ORAL
  Filled 2015-01-16 (×2): qty 1

## 2015-01-16 MED ORDER — ALBUTEROL SULFATE (2.5 MG/3ML) 0.083% IN NEBU: 3.0000 mL | INHALATION_SOLUTION | Freq: Four times a day (QID) | RESPIRATORY_TRACT | Status: DC | PRN

## 2015-01-16 MED ORDER — SODIUM CHLORIDE 0.9 % IV SOLN: 250.0000 mL | INTRAVENOUS | Status: DC | PRN

## 2015-01-16 MED ORDER — CLOPIDOGREL BISULFATE 75 MG PO TABS
75.0000 mg | ORAL_TABLET | Freq: Every day | ORAL | Status: DC
  Administered 2015-01-16: 75 mg via ORAL
  Filled 2015-01-16: qty 1

## 2015-01-16 MED ORDER — GABAPENTIN 300 MG PO CAPS
300.0000 mg | ORAL_CAPSULE | Freq: Three times a day (TID) | ORAL | Status: DC
  Administered 2015-01-16 (×2): 300 mg via ORAL
  Filled 2015-01-16 (×2): qty 1

## 2015-01-16 MED ORDER — HYDROCODONE-ACETAMINOPHEN 7.5-325 MG PO TABS
1.0000 | ORAL_TABLET | Freq: Four times a day (QID) | ORAL | Status: DC | PRN
  Administered 2015-01-16 (×2): 1 via ORAL
  Filled 2015-01-16 (×2): qty 1

## 2015-01-16 MED ORDER — METHOCARBAMOL 500 MG PO TABS
750.0000 mg | ORAL_TABLET | Freq: Three times a day (TID) | ORAL | Status: DC
  Administered 2015-01-16: 750 mg via ORAL
  Filled 2015-01-16: qty 2

## 2015-01-16 MED ORDER — ENOXAPARIN SODIUM 40 MG/0.4ML ~~LOC~~ SOLN
40.0000 mg | SUBCUTANEOUS | Status: DC
  Filled 2015-01-16: qty 0.4

## 2015-01-16 MED ORDER — ISOSORBIDE MONONITRATE ER 60 MG PO TB24
60.0000 mg | ORAL_TABLET | Freq: Two times a day (BID) | ORAL | Status: DC
  Administered 2015-01-16 (×2): 60 mg via ORAL
  Filled 2015-01-16 (×2): qty 1

## 2015-01-16 MED ORDER — SODIUM CHLORIDE 0.9 % IJ SOLN
3.0000 mL | INTRAMUSCULAR | Status: DC | PRN
Start: 2015-01-16 — End: 2015-01-16

## 2015-01-16 MED ORDER — ASPIRIN 81 MG PO CHEW
324.0000 mg | CHEWABLE_TABLET | Freq: Once | ORAL | Status: AC
  Administered 2015-01-16: 324 mg via ORAL
  Filled 2015-01-16: qty 4

## 2015-01-16 MED ORDER — MORPHINE SULFATE 2 MG/ML IJ SOLN
2.0000 mg | INTRAMUSCULAR | Status: DC | PRN
  Administered 2015-01-16 (×2): 2 mg via INTRAVENOUS
  Filled 2015-01-16 (×2): qty 1

## 2015-01-16 MED ORDER — SODIUM CHLORIDE 0.9 % IJ SOLN
3.0000 mL | Freq: Two times a day (BID) | INTRAMUSCULAR | Status: DC
  Administered 2015-01-16: 3 mL via INTRAVENOUS

## 2015-01-16 MED ORDER — FUROSEMIDE 20 MG PO TABS
20.0000 mg | ORAL_TABLET | Freq: Every day | ORAL | Status: DC
  Administered 2015-01-16: 20 mg via ORAL
  Filled 2015-01-16: qty 1

## 2015-01-16 MED ORDER — PRAVASTATIN SODIUM 40 MG PO TABS: 80.0000 mg | ORAL_TABLET | Freq: Every evening | ORAL | Status: DC

## 2015-01-16 MED ORDER — SODIUM CHLORIDE 0.9 % IJ SOLN
3.0000 mL | Freq: Two times a day (BID) | INTRAMUSCULAR | Status: DC
  Administered 2015-01-16 (×2): 3 mL via INTRAVENOUS

## 2015-01-16 MED ORDER — NITROGLYCERIN 2 % TD OINT
1.0000 [in_us] | TOPICAL_OINTMENT | Freq: Once | TRANSDERMAL | Status: AC
  Administered 2015-01-16: 1 [in_us] via TOPICAL
  Filled 2015-01-16: qty 1

## 2015-01-16 MED ORDER — LISINOPRIL 5 MG PO TABS
2.5000 mg | ORAL_TABLET | Freq: Two times a day (BID) | ORAL | Status: DC
  Administered 2015-01-16 (×2): 2.5 mg via ORAL
  Filled 2015-01-16 (×2): qty 1

## 2015-01-16 NOTE — Progress Notes (Signed)
UR completed 

## 2015-01-16 NOTE — Progress Notes (Signed)
Discussed current prescribed inpatient pain management with pt, currently at a 8/10 with minimal relief from IV Morphine.  Pt with complaints of chronic back pain and states he has not taken his home prescribed regimen of pain medication.  Notified Dr Sharl Ma and provider prescribed pt home dose.  Will notify patient and continue to monitor.

## 2015-01-16 NOTE — Discharge Summary (Signed)
Physician Discharge Summary  [name] [name] YNW:[id number] DOB: January 08, 1966 DOA: 01/15/2015  PCP: [name], MD  Admit date: 01/15/2015 Discharge date: 01/16/2015  Time spent: 45 minutes  Recommendations for Outpatient Follow-up:  -Will be discharged home today. -Advised to follow up with Dr. Domenic Polite in 1-2 weeks.   Discharge Diagnoses:  Active Problems:   Mixed hyperlipidemia   TOBACCO USER   Exertional angina   Chest pain   Discharge Condition: Stable and improved  Filed Weights   01/15/15 2158 01/16/15 0204  Weight: 96.163 kg (212 lb) 96.163 kg (212 lb)    History of present illness:  49 year old male who  has a past medical history of Coronary atherosclerosis of native coronary artery; Mixed hyperlipidemia; Bipolar disorder; GERD (gastroesophageal reflux disease); Gout; Chronic back pain; Ischemic cardiomyopathy; Sinus bradycardia; Essential hypertension; Asthma; and Diabetes mellitus without complication. Today patient came to the ED with chief complaint of chest tightness. Patient says that he had 4 episodes today in the late afternoon after he was putting flooring in the back of the truck. The pain would last for a few minutes when he stopped and rested went away however would come back in several minutes. He also had nausea, shortness of breath and diaphoresis at that time. Patient called his cardiologist and he was told to go to the ED for further evaluation. Patient has significant history of CAD had a cardiac cath in September 2015 and had a stent placement at that time. Patient also had CABG in 2003. The patient describes was 10/10 in intensity. At this time patient is chest pain-free, no shortness of breath at this time. Cardiac enzymes in the ED is negative. ED physician called cardiologist on call Dr. Percival Spanish, who recommends to follow serial cardiac enzymes.  Hospital Course:   Chest pain -Chest pain has resolved. -Has ruled out for acute coronary syndrome  with 3 sets of negative troponins and EKG without acute ischemic abnormalities. -He does have a history of CABG in 2003 as well as coronary artery disease with stent placement in September 2015. -I recommend that he follow-up with Dr. Domenic Polite in 1-2 weeks for consideration of a stress test.  Rest of chronic conditions are stable.  Procedures:  None   Consultations:  None  Discharge Instructions  Discharge Instructions    Diet - low sodium heart healthy    Complete by:  As directed      Increase activity slowly    Complete by:  As directed             Medication List    TAKE these medications        acetaminophen 325 MG tablet  Commonly known as:  TYLENOL  Take 2 tablets (650 mg total) by mouth every 4 (four) hours as needed for headache or mild pain.     albuterol 108 (90 BASE) MCG/ACT inhaler  Commonly known as:  PROVENTIL HFA;VENTOLIN HFA  Inhale 2 puffs into the lungs every 6 (six) hours as needed for wheezing or shortness of breath.     aspirin EC 81 MG tablet  Take 1 tablet (81 mg total) by mouth daily.     Blood Pressure Monitoring Kit  1 each by Does not apply route as directed.     carvedilol 6.25 MG tablet  Commonly known as:  COREG  Take 1 tablet (6.25 mg total) by mouth 2 (two) times daily with a meal.     clopidogrel 75 MG tablet  Commonly known  as:  PLAVIX  Take 1 tablet (75 mg total) by mouth daily.     diazepam 10 MG tablet  Commonly known as:  VALIUM  Take 10 mg by mouth 2 (two) times daily.     Fish Oil 1000 MG Caps  Take 1,000-2,000 mg by mouth 2 (two) times daily. 1 IN THE MORNING AND 2 AT BEDTIME     furosemide 20 MG tablet  Commonly known as:  LASIX  Take 1 tablet (20 mg total) by mouth at bedtime.     HYDROcodone-acetaminophen 7.5-325 MG per tablet  Commonly known as:  NORCO  Take 1 tablet by mouth every 6 (six) hours as needed for moderate pain.     isosorbide mononitrate 60 MG 24 hr tablet  Commonly known as:  IMDUR  TAKE 1  TABLET BY MOUTH TWO TIMES DAILY     lisinopril 2.5 MG tablet  Commonly known as:  PRINIVIL,ZESTRIL  Take 1 tablet (2.5 mg total) by mouth 2 (two) times daily.     metFORMIN 1000 MG tablet  Commonly known as:  GLUCOPHAGE  Take 1,000 mg by mouth 2 (two) times daily with a meal.     methocarbamol 750 MG tablet  Commonly known as:  ROBAXIN  Take 750 mg by mouth every 8 (eight) hours.     NEURONTIN 300 MG capsule  Generic drug:  gabapentin  Take 1 capsule (300 mg total) by mouth 3 (three) times daily.     NITROSTAT 0.4 MG SL tablet  Generic drug:  nitroGLYCERIN  PLACE 1 TABLET UNDER THE TONGUE EVERY 5  MINUTES FOR 3  DOSES AS NEEDED FOR CHEST PAIN.     pantoprazole 40 MG tablet  Commonly known as:  PROTONIX  Take 40 mg by mouth daily.     pravastatin 80 MG tablet  Commonly known as:  PRAVACHOL  Take 1 tablet (80 mg total) by mouth every evening.     RANEXA 500 MG 12 hr tablet  Generic drug:  ranolazine  TAKE 1 TABLET BY MOUTH TWO TIMES DAILY       No Known Allergies     Follow-up Information    Follow up with [name],[name], MD. Schedule an appointment as soon as possible for a visit in 2 weeks.   Specialty:  Internal Medicine   Contact information:   [address] [place] [address] 062 [phone]       Follow up with [name], MD. Schedule an appointment as soon as possible for a visit in 1 week.   Specialty:  Cardiology   Contact information:   [place] [address] [phone]        The results of significant diagnostics from this hospitalization (including imaging, microbiology, ancillary and laboratory) are listed below for reference.    Significant Diagnostic Studies: Dg Chest 2 View  01/15/2015   CLINICAL DATA:  Intermittent left-sided chest pain radiating down the left arm with nausea and shortness of breath.  EXAM: CHEST  2 VIEW  COMPARISON:  04/05/2014  FINDINGS: Postoperative changes in the mediastinum. Normal heart size  and pulmonary vascularity. No focal airspace disease or consolidation in the lungs. No blunting of costophrenic angles. No pneumothorax. Mediastinal contours appear intact. Mild degenerative changes in the spine and shoulders.  IMPRESSION: No active cardiopulmonary disease.   Electronically Signed   By: Lucienne Capers M.D.   On: 01/15/2015 22:40    Microbiology: Recent Results (from the past 240 hour(s))  MRSA PCR Screening     Status: None   Collection Time: 01/16/15  8:45 AM  Result Value Ref Range Status   MRSA by PCR NEGATIVE NEGATIVE Final    Comment:        The GeneXpert MRSA Assay (FDA approved for NASAL specimens only), is one component of a comprehensive MRSA colonization surveillance program. It is not intended to diagnose MRSA infection nor to guide or monitor treatment for MRSA infections.      Labs: Basic Metabolic Panel:  Recent Labs Lab 01/15/15 2243 01/16/15 0246  NA 137 137  K 3.6 3.9  CL 101 100  CO2 25 25  GLUCOSE 110* 107*  BUN 9 9  CREATININE 0.84 0.76  CALCIUM 8.7 8.5   Liver Function Tests:  Recent Labs Lab 01/16/15 0246  AST 19  ALT 21  ALKPHOS 42  BILITOT 0.4  PROT 7.2  ALBUMIN 4.2   No results for input(s): LIPASE, AMYLASE in the last 168 hours. No results for input(s): AMMONIA in the last 168 hours. CBC:  Recent Labs Lab 01/15/15 2243 01/16/15 0246  WBC 8.5 7.3  NEUTROABS 5.0  --   HGB 14.4 13.7  HCT 41.9 40.4  MCV 99.5 100.0  PLT 182 164   Cardiac Enzymes:  Recent Labs Lab 01/15/15 2243 01/16/15 0246 01/16/15 0855  TROPONINI <0.03 <0.03 <0.03   BNP: BNP (last 3 results) No results for input(s): BNP in the last 8760 hours.  ProBNP (last 3 results) No results for input(s): PROBNP in the last 8760 hours.  CBG: No results for input(s): GLUCAP in the last 168 hours.     SignedLelon Frohlich  [facility] Pager: [phone] 01/16/2015, 2:26 PM

## 2015-01-16 NOTE — Progress Notes (Signed)
Pt discharged home today per Dr. Hernandez. Pt's IV site D/C'd and WDL. Pt's VSS. Pt provided with home medication list and discharge instructions. Verbalized understanding. Pt left floor via WC in stable condition accompanied by NT. 

## 2015-01-16 NOTE — H&P (Signed)
PCP:   [name],[name], MD   Chief Complaint:  Chest pain  HPI: 49 year old male who   has a past medical history of Coronary atherosclerosis of native coronary artery; Mixed hyperlipidemia; Bipolar disorder; GERD (gastroesophageal reflux disease); Gout; Chronic back pain; Ischemic cardiomyopathy; Sinus bradycardia; Essential hypertension; Asthma; and Diabetes mellitus without complication. Today patient came to the ED with chief complaint of chest tightness. Patient says that he had 4 episodes today in the late afternoon after he was putting flooring in the back of the truck. The pain would last for a few minutes when he stopped and rested went away however would come back in several minutes. He also had nausea, shortness of breath and diaphoresis at that time. Patient called his cardiologist and he was told to go to the ED for further evaluation. Patient has significant history of CAD had a cardiac cath in September 2015 and had a stent placement at that time. Patient also had CABG in 2003. The patient describes was 10/10 in intensity. At this time patient is chest pain-free, no shortness of breath at this time. Cardiac enzymes in the ED is negative. ED physician called cardiologist on call Dr. Percival Spanish, who recommends to follow serial cardiac enzymes.  Allergies:  No Known Allergies    Past Medical History  Diagnosis Date  . Coronary atherosclerosis of native coronary artery     a. CABG 2003. b. NSTEMI 2010 s/p BMS to SVG-RCA c. PTCA/stent/asp thromb to SVG-RCA 01/2010, PTCA to SVG-RCA 04/2010 d. known occlusion of SVG-RCA 2013. e. NSTEMI 07/2013  s/p PTCA/DES to SVG-diagonal/intermediate/OM1.   . Mixed hyperlipidemia   . Bipolar disorder   . GERD (gastroesophageal reflux disease)   . Gout   . Chronic back pain   . Ischemic cardiomyopathy     a. EF 35-40% by cath 07/2013.  . Sinus bradycardia   . Essential hypertension   . Asthma   . Diabetes mellitus without complication     Past  Surgical History  Procedure Laterality Date  . Cholecystectomy    . Coronary artery bypass graft      2003, LIMA to LAD; SVG to diagonal; SVG to OM, ramus, and diagonal; SVG to RCA  . Pin placement left leg    . Metal plate to chin    . Left heart catheterization with coronary/graft angiogram N/A 07/14/2013    Procedure: LEFT HEART CATHETERIZATION WITH Beatrix Fetters;  Surgeon: Burnell Blanks, MD;  Location: [facility] CATH LAB;  Service: Cardiovascular;  Laterality: N/A;  . Percutaneous coronary stent intervention (pci-s)  07/14/2013    Procedure: PERCUTANEOUS CORONARY STENT INTERVENTION (PCI-S);  Surgeon: Burnell Blanks, MD;  Location: [facility] CATH LAB;  Service: Cardiovascular;;  . Left heart catheterization with coronary/graft angiogram N/A 12/29/2013    Procedure: LEFT HEART CATHETERIZATION WITH Beatrix Fetters;  Surgeon: Leonie Man, MD;  Location: [facility] CATH LAB;  Service: Cardiovascular;  Laterality: N/A;  . Left heart catheterization with coronary/graft angiogram N/A 06/19/2014    Procedure: LEFT HEART CATHETERIZATION WITH Beatrix Fetters;  Surgeon: Leonie Man, MD;  Location: [facility] CATH LAB;  Service: Cardiovascular;  Laterality: N/A;  . Cardiac catheterization  06/19/2014    Procedure: CORONARY STENT INTERVENTION;  Surgeon: Leonie Man, MD;  Location: [facility] CATH LAB;  Service: Cardiovascular;;  DES to seg SVG Diag/OM/Ramus     Prior to Admission medications   Medication Sig Start Date End Date Taking? Authorizing Provider  albuterol (PROVENTIL HFA;VENTOLIN HFA) 108 (90 BASE) MCG/ACT inhaler  Inhale 2 puffs into the lungs every 6 (six) hours as needed for wheezing or shortness of breath.   Yes Historical Provider, MD  aspirin EC 81 MG tablet Take 1 tablet (81 mg total) by mouth daily. 12/05/12  Yes Donney Dice, PA-C  carvedilol (COREG) 6.25 MG tablet Take 1 tablet (6.25 mg total) by mouth 2 (two) times daily with a meal. 07/21/14  Yes Satira Sark,  MD  clopidogrel (PLAVIX) 75 MG tablet Take 1 tablet (75 mg total) by mouth daily. 06/17/14  Yes Satira Sark, MD  diazepam (VALIUM) 10 MG tablet Take 10 mg by mouth 2 (two) times daily.   Yes Historical Provider, MD  furosemide (LASIX) 20 MG tablet Take 1 tablet (20 mg total) by mouth at bedtime. 12/04/14  Yes Satira Sark, MD  gabapentin (NEURONTIN) 300 MG capsule Take 1 capsule (300 mg total) by mouth 3 (three) times daily. 08/04/14  Yes Satira Sark, MD  HYDROcodone-acetaminophen (NORCO) 7.5-325 MG per tablet Take 1 tablet by mouth every 6 (six) hours as needed for moderate pain.   Yes Historical Provider, MD  isosorbide mononitrate (IMDUR) 60 MG 24 hr tablet TAKE 1 TABLET BY MOUTH TWO TIMES DAILY 11/13/14  Yes Satira Sark, MD  lisinopril (PRINIVIL,ZESTRIL) 2.5 MG tablet Take 1 tablet (2.5 mg total) by mouth 2 (two) times daily. 09/29/14  Yes Satira Sark, MD  metFORMIN (GLUCOPHAGE) 1000 MG tablet Take 1,000 mg by mouth 2 (two) times daily with a meal.   Yes Historical Provider, MD  methocarbamol (ROBAXIN) 750 MG tablet Take 750 mg by mouth every 8 (eight) hours.    Yes Historical Provider, MD  NITROSTAT 0.4 MG SL tablet PLACE 1 TABLET UNDER THE TONGUE EVERY 5  MINUTES FOR 3  DOSES AS NEEDED FOR CHEST PAIN. 09/10/14  Yes Satira Sark, MD  Omega-3 Fatty Acids (FISH OIL) 1000 MG CAPS Take 1,000-2,000 mg by mouth 2 (two) times daily. 1 IN THE MORNING AND 2 AT BEDTIME   Yes Historical Provider, MD  pantoprazole (PROTONIX) 40 MG tablet Take 40 mg by mouth daily.    Yes Historical Provider, MD  pravastatin (PRAVACHOL) 80 MG tablet Take 1 tablet (80 mg total) by mouth every evening. 10/15/14  Yes Satira Sark, MD  RANEXA 500 MG 12 hr tablet TAKE 1 TABLET BY MOUTH TWO TIMES DAILY 07/23/14  Yes Satira Sark, MD  acetaminophen (TYLENOL) 325 MG tablet Take 2 tablets (650 mg total) by mouth every 4 (four) hours as needed for headache or mild pain. Patient not taking: Reported  on 01/15/2015 06/20/14   Erlene Quan, PA-C  Blood Pressure Monitoring KIT 1 each by Does not apply route as directed. 09/29/14   Satira Sark, MD    Social History:  reports that he has been smoking Cigarettes.  He started smoking about 36 years ago. He has a 26.4 pack-year smoking history. He has never used smokeless tobacco. He reports that he uses illicit drugs. He reports that he does not drink alcohol.  Family History  Problem Relation Age of Onset  . Stroke      family Hx      All the positives are listed in BOLD  Review of Systems:  HEENT: Headache, blurred vision, runny nose, sore throat Neck: Hypothyroidism, hyperthyroidism,,lymphadenopathy Chest : Shortness of breath, history of COPD, Asthma Heart : Chest pain, history of coronary arterey disease GI:  Nausea, vomiting, diarrhea, constipation, GERD GU: Dysuria, urgency,  frequency of urination, hematuria Neuro: Stroke, seizures, syncope Psych: Depression, anxiety, hallucinations   Physical Exam: Blood pressure 110/67, pulse 51, temperature 98.1 F (36.7 C), temperature source Oral, resp. rate 20, height 5' 3"  (1.6 m), weight 96.163 kg (212 lb), SpO2 99 %. Constitutional:   Patient is a well-developed and well-nourished male* in no acute distress and cooperative with exam. Head: Normocephalic and atraumatic Mouth: Mucus membranes moist Eyes: PERRL, EOMI, conjunctivae normal Neck: Supple, No Thyromegaly Cardiovascular: RRR, S1 normal, S2 normal Pulmonary/Chest: CTAB, no wheezes, rales, or rhonchi Abdominal: Soft. Non-tender, non-distended, bowel sounds are normal, no masses, organomegaly, or guarding present.  Neurological: A&O x3, Strength is normal and symmetric bilaterally, cranial nerve II-XII are grossly intact, no focal motor deficit, sensory intact to light touch bilaterally.  Extremities : No Cyanosis, Clubbing or Edema  Labs on Admission:  Basic Metabolic Panel:  Recent Labs Lab 01/15/15 2243  NA 137    K 3.6  CL 101  CO2 25  GLUCOSE 110*  BUN 9  CREATININE 0.84  CALCIUM 8.7   Liver Function Tests: No results for input(s): AST, ALT, ALKPHOS, BILITOT, PROT, ALBUMIN in the last 168 hours. No results for input(s): LIPASE, AMYLASE in the last 168 hours. No results for input(s): AMMONIA in the last 168 hours. CBC:  Recent Labs Lab 01/15/15 2243  WBC 8.5  NEUTROABS 5.0  HGB 14.4  HCT 41.9  MCV 99.5  PLT 182   Cardiac Enzymes:  Recent Labs Lab 01/15/15 2243  TROPONINI <0.03    BNP (last 3 results) No results for input(s): BNP in the last 8760 hours.  ProBNP (last 3 results) No results for input(s): PROBNP in the last 8760 hours.  CBG: No results for input(s): GLUCAP in the last 168 hours.  Radiological Exams on Admission: Dg Chest 2 View  01/15/2015   CLINICAL DATA:  Intermittent left-sided chest pain radiating down the left arm with nausea and shortness of breath.  EXAM: CHEST  2 VIEW  COMPARISON:  04/05/2014  FINDINGS: Postoperative changes in the mediastinum. Normal heart size and pulmonary vascularity. No focal airspace disease or consolidation in the lungs. No blunting of costophrenic angles. No pneumothorax. Mediastinal contours appear intact. Mild degenerative changes in the spine and shoulders.  IMPRESSION: No active cardiopulmonary disease.   Electronically Signed   By: Lucienne Capers M.D.   On: 01/15/2015 22:40    EKG: Independently reviewed. Normal sinus rhythm, nonspecific T-wave abnormality   Assessment/Plan Active Problems:   Mixed hyperlipidemia   TOBACCO USER   Exertional angina   Chest pain  Chest pain We'll admit the patient to observation, obtain serial cardiac enzymes. We'll start morphine when necessary for pain. Currently patient is chest pain-free. Consult cardiology in a.m.  Tobacco abuse We'll give nicotine patch  14 milligrams transdermal daily.  CAD Continue Ranexa, Plavix, Coreg, aspirin  Code status: Full code  Family  discussion: No family at bedside   Time Spent on Admission: 60 minutes  Shayanna Thatch S [facility] Pager: [phone] 01/16/2015, 2:38 AM  If 7PM-7AM, please contact night-coverage  [email or web address]  Password [email or web address]

## 2015-01-26 ENCOUNTER — Encounter: Payer: Medicare Other | Admitting: Cardiology

## 2015-01-27 ENCOUNTER — Encounter: Payer: Self-pay | Admitting: Cardiology

## 2015-01-27 ENCOUNTER — Other Ambulatory Visit: Payer: Self-pay | Admitting: Cardiology

## 2015-01-27 ENCOUNTER — Ambulatory Visit (INDEPENDENT_AMBULATORY_CARE_PROVIDER_SITE_OTHER): Payer: Medicare Other | Admitting: Cardiology

## 2015-01-27 ENCOUNTER — Encounter: Payer: Self-pay | Admitting: *Deleted

## 2015-01-27 VITALS — BP 102/68 | HR 72 | Ht 64.0 in | Wt 212.0 lb

## 2015-01-27 DIAGNOSIS — I25119 Atherosclerotic heart disease of native coronary artery with unspecified angina pectoris: Secondary | ICD-10-CM

## 2015-01-27 DIAGNOSIS — I200 Unstable angina: Secondary | ICD-10-CM

## 2015-01-27 DIAGNOSIS — I255 Ischemic cardiomyopathy: Secondary | ICD-10-CM

## 2015-01-27 DIAGNOSIS — E785 Hyperlipidemia, unspecified: Secondary | ICD-10-CM

## 2015-01-27 DIAGNOSIS — F17200 Nicotine dependence, unspecified, uncomplicated: Secondary | ICD-10-CM

## 2015-01-27 DIAGNOSIS — Z720 Tobacco use: Secondary | ICD-10-CM

## 2015-01-27 NOTE — Patient Instructions (Signed)
Your physician recommends that you continue on your current medications as directed. Please refer to the Current Medication list given to you today. Your physician has requested that you have a cardiac catheterization. Cardiac catheterization is used to diagnose and/or treat various heart conditions. Doctors may recommend this procedure for a number of different reasons. The most common reason is to evaluate chest pain. Chest pain can be a symptom of coronary artery disease (CAD), and cardiac catheterization can show whether plaque is narrowing or blocking your heart's arteries. This procedure is also used to evaluate the valves, as well as measure the blood flow and oxygen levels in different parts of your heart. For further information please visit [email or web address]. Please follow instruction sheet, as given.   

## 2015-01-27 NOTE — Progress Notes (Signed)
Cardiology Office Note  Date: 01/27/2015   ID: [name] [name], DOB 1966/06/30, MRN [id number]  PCP: [name], MD  Primary Cardiologist: [name], MD   Chief Complaint  Patient presents with  . Hospitalization Follow-up  . Coronary Artery Disease    History of Present Illness: [name] [name] is a medically complex 49 y.o. male last seen in March. Record review finds recent hospitalization at [facility] in early April with chest pain. He ruled out for ACS by cardiac enzymes, chest x-ray showed no acute findings, and ECG showed no acute ST segment changes. No additional cardiac testing was performed at that time.  He tells me that over the last few weeks he has had increasing angina with fairly little activity, just walking around the house. He was moving some small boards to be thrown away and had to stop every few minutes related to chest pain. He has had increasing nitroglycerin requirements as well. Unfortunately, he continues to smoke cigarettes despite discussions about the critical importance of smoking cessation. He does state that he has been compliant with his medications.  Cardiac catheterization performed by Dr. Ellyn Hack in September 2015 demonstrated ostial occlusion of the LAD with diffusely diseased distal LAD that fills via patent LIMA, 80% proximal circumflex, 95% OM1/ramus, 20% in-stent restenosis within the SVG to OM1/ramus with severe distal stenosis of 95% within the graft segment, occluded mid RCA with some left-to-right collateralization, known occlusion of the SVG to PDA. Patient underwent placement of DES to the distal SVG to OM1/ramus system.  Past Medical History  Diagnosis Date  . Coronary atherosclerosis of native coronary artery     a. CABG 2003. b. NSTEMI 2010 s/p BMS to SVG-RCA c. PTCA/stent/asp thromb to SVG-RCA 01/2010, PTCA to SVG-RCA 04/2010 d. known occlusion of SVG-RCA 2013. e. NSTEMI 07/2013  s/p PTCA/DES to SVG-diagonal/intermediate/OM1.     . Mixed hyperlipidemia   . Bipolar disorder   . GERD (gastroesophageal reflux disease)   . Gout   . Chronic back pain   . Ischemic cardiomyopathy     a. EF 35-40% by cath 07/2013.  . Sinus bradycardia   . Essential hypertension   . Asthma   . Type 2 diabetes mellitus     Past Surgical History  Procedure Laterality Date  . Cholecystectomy    . Coronary artery bypass graft      2003, LIMA to LAD; SVG to diagonal; SVG to OM, ramus, and diagonal; SVG to RCA  . Pin placement left leg    . Metal plate to chin    . Left heart catheterization with coronary/graft angiogram N/A 07/14/2013    Procedure: LEFT HEART CATHETERIZATION WITH Beatrix Fetters;  Surgeon: Burnell Blanks, MD;  Location: [facility] CATH LAB;  Service: Cardiovascular;  Laterality: N/A;  . Percutaneous coronary stent intervention (pci-s)  07/14/2013    Procedure: PERCUTANEOUS CORONARY STENT INTERVENTION (PCI-S);  Surgeon: Burnell Blanks, MD;  Location: [facility] CATH LAB;  Service: Cardiovascular;;  . Left heart catheterization with coronary/graft angiogram N/A 12/29/2013    Procedure: LEFT HEART CATHETERIZATION WITH Beatrix Fetters;  Surgeon: Leonie Man, MD;  Location: [facility] CATH LAB;  Service: Cardiovascular;  Laterality: N/A;  . Left heart catheterization with coronary/graft angiogram N/A 06/19/2014    Procedure: LEFT HEART CATHETERIZATION WITH Beatrix Fetters;  Surgeon: Leonie Man, MD;  Location: [facility] CATH LAB;  Service: Cardiovascular;  Laterality: N/A;  . Cardiac catheterization  06/19/2014    Procedure: CORONARY STENT INTERVENTION;  Surgeon:  Leonie Man, MD;  Location: [facility] CATH LAB;  Service: Cardiovascular;;  DES to seg SVG Diag/OM/Ramus     Current Outpatient Prescriptions  Medication Sig Dispense Refill  . acetaminophen (TYLENOL) 325 MG tablet Take 2 tablets (650 mg total) by mouth every 4 (four) hours as needed for headache or mild pain.    Marland Kitchen albuterol (PROVENTIL HFA;VENTOLIN  HFA) 108 (90 BASE) MCG/ACT inhaler Inhale 2 puffs into the lungs every 6 (six) hours as needed for wheezing or shortness of breath.    Marland Kitchen aspirin EC 81 MG tablet Take 1 tablet (81 mg total) by mouth daily.    . Blood Pressure Monitoring KIT 1 each by Does not apply route as directed. 1 kit 0  . carvedilol (COREG) 6.25 MG tablet Take 1 tablet (6.25 mg total) by mouth 2 (two) times daily with a meal. 60 tablet 6  . clopidogrel (PLAVIX) 75 MG tablet Take 1 tablet (75 mg total) by mouth daily. 30 tablet 11  . diazepam (VALIUM) 10 MG tablet Take 10 mg by mouth 2 (two) times daily.    . furosemide (LASIX) 20 MG tablet Take 1 tablet (20 mg total) by mouth at bedtime. 30 tablet 6  . gabapentin (NEURONTIN) 300 MG capsule Take 1 capsule (300 mg total) by mouth 3 (three) times daily.    Marland Kitchen HYDROcodone-acetaminophen (NORCO) 7.5-325 MG per tablet Take 1 tablet by mouth every 6 (six) hours as needed for moderate pain.    . isosorbide mononitrate (IMDUR) 60 MG 24 hr tablet TAKE 1 TABLET BY MOUTH TWO TIMES DAILY 180 tablet 3  . lisinopril (PRINIVIL,ZESTRIL) 2.5 MG tablet Take 1 tablet (2.5 mg total) by mouth 2 (two) times daily. 180 tablet 3  . metFORMIN (GLUCOPHAGE) 1000 MG tablet Take 1,000 mg by mouth 2 (two) times daily with a meal.    . methocarbamol (ROBAXIN) 750 MG tablet Take 750 mg by mouth every 8 (eight) hours.     Marland Kitchen NITROSTAT 0.4 MG SL tablet PLACE 1 TABLET UNDER THE TONGUE EVERY 5  MINUTES FOR 3  DOSES AS NEEDED FOR CHEST PAIN. 25 tablet 3  . Omega-3 Fatty Acids (FISH OIL) 1000 MG CAPS Take 1,000-2,000 mg by mouth 2 (two) times daily. 1 IN THE MORNING AND 2 AT BEDTIME    . pantoprazole (PROTONIX) 40 MG tablet Take 40 mg by mouth daily.     . pravastatin (PRAVACHOL) 80 MG tablet Take 1 tablet (80 mg total) by mouth every evening. 90 tablet 3  . RANEXA 500 MG 12 hr tablet TAKE 1 TABLET BY MOUTH TWO TIMES DAILY 120 tablet 2   No current facility-administered medications for this visit.    Allergies:   Review of patient's allergies indicates no known allergies.   Social History: The patient  reports that he has been smoking Cigarettes.  He started smoking about 36 years ago. He has a 26.4 pack-year smoking history. He has never used smokeless tobacco. He reports that he uses illicit drugs. He reports that he does not drink alcohol.   ROS:  Please see the history of present illness. Otherwise, complete review of systems is positive for insomnia the last few nights, he has been very fatigued.  All other systems are reviewed and negative.   Physical Exam: VS:  BP 102/68 mmHg  Pulse 72  Ht 5' 4"  (1.626 m)  Wt 212 lb (96.163 kg)  BMI 36.37 kg/m2  SpO2 93%, BMI Body mass index is 36.37 kg/(m^2).  Wt Readings  from Last 3 Encounters:  01/27/15 212 lb (96.163 kg)  01/16/15 212 lb (96.163 kg)  12/30/14 212 lb (96.163 kg)     Overweight male no distress.  HEENT: Conjunctiva and lids normal, oropharynx clear with poor dentition.  Neck: Supple, no elevated JVP or carotid bruits, no thyromegaly.  Lungs: Clear to auscultation, nonlabored breathing at rest.  Cardiac: Regular rate and rhythm, no S3, no pericardial rub.  Abdomen: Soft, nontender, bowel sounds present.  Extremities: No pitting edema, distal pulses 2+.  Skin: Warm and dry, scattered tattoos. Muscular skeletal: No kyphosis. Neuropsychiatric: Alert and oriented 3, somewhat flat affect.   ECG: Recent tracing reviewed.   Recent Labwork: 01/16/2015: ALT 21; AST 19; BUN 9; Creatinine 0.76; Hemoglobin 13.7; Platelets 164; Potassium 3.9; Sodium 137     Component Value Date/Time   CHOL 113 12/28/2013 0530   TRIG 204* 12/28/2013 0530   HDL 25* 12/28/2013 0530   CHOLHDL 4.5 12/28/2013 0530   VLDL 41* 12/28/2013 0530   LDLCALC 47 12/28/2013 0530    Assessment and Plan:  1. Accelerating angina over the last few weeks. Mr. Roupp has fairly aggressive cardiovascular disease complicated by ongoing tobacco abuse. He was recently  hospitalized at [facility] with negative cardiac markers. Symptoms have progressed since that time. His medical regimen is actually fairly optimal, blood pressure and heart rate are well controlled. We have discussed the situation, and recommendation is for him to proceed with a diagnostic heart catheterization to assess for any potential revascularization options that might help his symptoms. He is in agreement to proceed and this is being scheduled for tomorrow with Dr. Ellyn Hack.  2. Multivessel CAD status post CABG with subsequent graft disease and percutaneous interventions, most recently DES to the SVG to ramus/obtuse marginal system.  3. Ischemic cardiomyopathy with LVEF approximately 40% by Cardiolite in September 2015.  4. Ongoing tobacco abuse. We have discussed smoking cessation on many occasions, he has not been able to quit.  5. Type 2 diabetes mellitus.  6. Hyperlipidemia.  Current medicines were reviewed with the patient today.   Disposition: FU with me after cardiac catheterization.   Signed, Satira Sark, MD, [facility] 01/27/2015 2:43 PM    [facility] [address], [place], [place] [address] Phone: [phone]; Fax: [phone]

## 2015-01-28 ENCOUNTER — Ambulatory Visit (HOSPITAL_COMMUNITY)
Admission: RE | Admit: 2015-01-28 | Discharge: 2015-01-28 | Disposition: A | Discharge status: Home / Self Care | Payer: Medicare Other | Source: Ambulatory Visit | Attending: Cardiology | Admitting: Cardiology

## 2015-01-28 ENCOUNTER — Encounter (HOSPITAL_COMMUNITY)
Admission: RE | Disposition: A | Discharge status: Home / Self Care | Payer: Self-pay | Source: Ambulatory Visit | Attending: Cardiology

## 2015-01-28 DIAGNOSIS — G8929 Other chronic pain: Secondary | ICD-10-CM | POA: Diagnosis not present

## 2015-01-28 DIAGNOSIS — E119 Type 2 diabetes mellitus without complications: Secondary | ICD-10-CM | POA: Diagnosis not present

## 2015-01-28 DIAGNOSIS — J45909 Unspecified asthma, uncomplicated: Secondary | ICD-10-CM | POA: Diagnosis not present

## 2015-01-28 DIAGNOSIS — M549 Dorsalgia, unspecified: Secondary | ICD-10-CM | POA: Diagnosis not present

## 2015-01-28 DIAGNOSIS — I25110 Atherosclerotic heart disease of native coronary artery with unstable angina pectoris: Secondary | ICD-10-CM | POA: Diagnosis not present

## 2015-01-28 DIAGNOSIS — F319 Bipolar disorder, unspecified: Secondary | ICD-10-CM | POA: Insufficient documentation

## 2015-01-28 DIAGNOSIS — Z955 Presence of coronary angioplasty implant and graft: Secondary | ICD-10-CM | POA: Diagnosis not present

## 2015-01-28 DIAGNOSIS — I25700 Atherosclerosis of coronary artery bypass graft(s), unspecified, with unstable angina pectoris: Secondary | ICD-10-CM | POA: Diagnosis present

## 2015-01-28 DIAGNOSIS — I255 Ischemic cardiomyopathy: Secondary | ICD-10-CM | POA: Insufficient documentation

## 2015-01-28 DIAGNOSIS — F17210 Nicotine dependence, cigarettes, uncomplicated: Secondary | ICD-10-CM | POA: Insufficient documentation

## 2015-01-28 DIAGNOSIS — E782 Mixed hyperlipidemia: Secondary | ICD-10-CM | POA: Diagnosis present

## 2015-01-28 DIAGNOSIS — Z7902 Long term (current) use of antithrombotics/antiplatelets: Secondary | ICD-10-CM | POA: Insufficient documentation

## 2015-01-28 DIAGNOSIS — I252 Old myocardial infarction: Secondary | ICD-10-CM | POA: Insufficient documentation

## 2015-01-28 DIAGNOSIS — F1910 Other psychoactive substance abuse, uncomplicated: Secondary | ICD-10-CM | POA: Diagnosis present

## 2015-01-28 DIAGNOSIS — K219 Gastro-esophageal reflux disease without esophagitis: Secondary | ICD-10-CM | POA: Insufficient documentation

## 2015-01-28 DIAGNOSIS — R079 Chest pain, unspecified: Secondary | ICD-10-CM | POA: Diagnosis present

## 2015-01-28 DIAGNOSIS — Z716 Tobacco abuse counseling: Secondary | ICD-10-CM | POA: Insufficient documentation

## 2015-01-28 DIAGNOSIS — M109 Gout, unspecified: Secondary | ICD-10-CM | POA: Insufficient documentation

## 2015-01-28 DIAGNOSIS — I10 Essential (primary) hypertension: Secondary | ICD-10-CM | POA: Diagnosis present

## 2015-01-28 DIAGNOSIS — I200 Unstable angina: Secondary | ICD-10-CM | POA: Diagnosis present

## 2015-01-28 DIAGNOSIS — I2510 Atherosclerotic heart disease of native coronary artery without angina pectoris: Secondary | ICD-10-CM | POA: Diagnosis not present

## 2015-01-28 DIAGNOSIS — Z7982 Long term (current) use of aspirin: Secondary | ICD-10-CM | POA: Diagnosis not present

## 2015-01-28 DIAGNOSIS — Z951 Presence of aortocoronary bypass graft: Secondary | ICD-10-CM | POA: Insufficient documentation

## 2015-01-28 DIAGNOSIS — Z9861 Coronary angioplasty status: Secondary | ICD-10-CM

## 2015-01-28 DIAGNOSIS — F17200 Nicotine dependence, unspecified, uncomplicated: Secondary | ICD-10-CM | POA: Diagnosis present

## 2015-01-28 DIAGNOSIS — Z9049 Acquired absence of other specified parts of digestive tract: Secondary | ICD-10-CM | POA: Diagnosis not present

## 2015-01-28 HISTORY — PX: LEFT HEART CATHETERIZATION WITH CORONARY ANGIOGRAM: SHX5451

## 2015-01-28 LAB — PROTIME-INR
INR: 0.96 (ref 0.00–1.49)
PROTHROMBIN TIME: 12.9 s (ref 11.6–15.2)

## 2015-01-28 LAB — GLUCOSE, CAPILLARY: Glucose-Capillary: 86 mg/dL (ref 70–99)

## 2015-01-28 SURGERY — LEFT HEART CATHETERIZATION WITH CORONARY ANGIOGRAM
Anesthesia: LOCAL

## 2015-01-28 MED ORDER — ONDANSETRON HCL 4 MG/2ML IJ SOLN: 4.0000 mg | Freq: Four times a day (QID) | INTRAMUSCULAR | Status: DC | PRN

## 2015-01-28 MED ORDER — SODIUM CHLORIDE 0.9 % IJ SOLN: 3.0000 mL | INTRAMUSCULAR | Status: DC | PRN

## 2015-01-28 MED ORDER — LIDOCAINE HCL (PF) 1 % IJ SOLN
INTRAMUSCULAR | Status: AC
  Filled 2015-01-28: qty 30

## 2015-01-28 MED ORDER — HEPARIN (PORCINE) IN NACL 2-0.9 UNIT/ML-% IJ SOLN
INTRAMUSCULAR | Status: AC
  Filled 2015-01-28: qty 1000

## 2015-01-28 MED ORDER — MIDAZOLAM HCL 2 MG/2ML IJ SOLN
INTRAMUSCULAR | Status: AC
  Filled 2015-01-28: qty 2

## 2015-01-28 MED ORDER — HYDROCODONE-ACETAMINOPHEN 5-325 MG PO TABS
1.0000 | ORAL_TABLET | Freq: Once | ORAL | Status: AC
  Administered 2015-01-28: 1 via ORAL

## 2015-01-28 MED ORDER — VERAPAMIL HCL 2.5 MG/ML IV SOLN
INTRAVENOUS | Status: AC
  Filled 2015-01-28: qty 2

## 2015-01-28 MED ORDER — SODIUM CHLORIDE 0.9 % IV SOLN: 1.0000 mL/kg/h | INTRAVENOUS | Status: DC

## 2015-01-28 MED ORDER — ACETAMINOPHEN 325 MG PO TABS: 650.0000 mg | ORAL_TABLET | ORAL | Status: DC | PRN

## 2015-01-28 MED ORDER — NITROGLYCERIN 1 MG/10 ML FOR IR/CATH LAB
INTRA_ARTERIAL | Status: AC
  Filled 2015-01-28: qty 10

## 2015-01-28 MED ORDER — HEPARIN SODIUM (PORCINE) 1000 UNIT/ML IJ SOLN
INTRAMUSCULAR | Status: AC
  Filled 2015-01-28: qty 1

## 2015-01-28 MED ORDER — SODIUM CHLORIDE 0.9 % IV BOLUS (SEPSIS)
250.0000 mL | Freq: Once | INTRAVENOUS | Status: AC
  Administered 2015-01-28: 250 mL via INTRAVENOUS

## 2015-01-28 MED ORDER — SODIUM CHLORIDE 0.9 % IV SOLN
INTRAVENOUS | Status: DC
  Administered 2015-01-28: 08:00:00 via INTRAVENOUS

## 2015-01-28 MED ORDER — SODIUM CHLORIDE 0.9 % IV SOLN: 250.0000 mL | INTRAVENOUS | Status: DC | PRN

## 2015-01-28 MED ORDER — HYDROCODONE-ACETAMINOPHEN 5-325 MG PO TABS
ORAL_TABLET | ORAL | Status: AC
  Administered 2015-01-28: 1 via ORAL
  Filled 2015-01-28: qty 1

## 2015-01-28 MED ORDER — SODIUM CHLORIDE 0.9 % IJ SOLN: 3.0000 mL | Freq: Two times a day (BID) | INTRAMUSCULAR | Status: DC

## 2015-01-28 NOTE — Discharge Instructions (Signed)
°  HOLD METFORMIN FOR 48 HOURS ° °Radial Site Care °Refer to this sheet in the next few weeks. These instructions provide you with information on caring for yourself after your procedure. Your caregiver may also give you more specific instructions. Your treatment has been planned according to current medical practices, but problems sometimes occur. Call your caregiver if you have any problems or questions after your procedure. °HOME CARE INSTRUCTIONS °· You may shower the day after the procedure. Remove the bandage (dressing) and gently wash the site with plain soap and water. Gently pat the site dry. °· Do not apply powder or lotion to the site. °· Do not submerge the affected site in water for 3 to 5 days. °· Inspect the site at least twice daily. °· Do not flex or bend the affected arm for 24 hours. °· No lifting over 5 pounds (2.3 kg) for 5 days after your procedure. °· Do not drive home if you are discharged the same day of the procedure. Have someone else drive you. °· You may drive 24 hours after the procedure unless otherwise instructed by your caregiver. °· Do not operate machinery or power tools for 24 hours. °· A responsible adult should be with you for the first 24 hours after you arrive home. °What to expect: °· Any bruising will usually fade within 1 to 2 weeks. °· Blood that collects in the tissue (hematoma) may be painful to the touch. It should usually decrease in size and tenderness within 1 to 2 weeks. °SEEK IMMEDIATE MEDICAL CARE IF: °· You have unusual pain at the radial site. °· You have redness, warmth, swelling, or pain at the radial site. °· You have drainage (other than a small amount of blood on the dressing). °· You have chills. °· You have a fever or persistent symptoms for more than 72 hours. °· You have a fever and your symptoms suddenly get worse. °· Your arm becomes pale, cool, tingly, or numb. °· You have heavy bleeding from the site. Hold pressure on the site. °Document Released:  10/28/2010 Document Revised: 12/18/2011 Document Reviewed: 10/28/2010 °Exit[employer]® Patient Information ©2015 [employer]. This information is not intended to replace advice given to you by your health care provider. Make sure you discuss any questions you have with your health care provider. ° °

## 2015-01-28 NOTE — Progress Notes (Signed)
TR BAND REMOVAL  LOCATION:    left radial  DEFLATED PER PROTOCOL:    Yes.    TIME BAND OFF / DRESSING APPLIED:    1115   SITE UPON ARRIVAL:    Level 0  SITE AFTER BAND REMOVAL:    Level 0  CIRCULATION SENSATION AND MOVEMENT:    Within Normal Limits   Yes.    COMMENTS:   TRB REMOVED/ TEGADERM DSG APPLIED

## 2015-01-28 NOTE — H&P (View-Only) (Signed)
Cardiology Office Note  Date: 01/27/2015   ID: FRANCO DULEY, DOB 1965/11/02, MRN [id number]  PCP: [name], MD  Primary Cardiologist: [name], MD   Chief Complaint  Patient presents with  . Hospitalization Follow-up  . Coronary Artery Disease    History of Present Illness: OSRIC KLOPF is a medically complex 49 y.o. male last seen in March. Record review finds recent hospitalization at [facility] in early April with chest pain. He ruled out for ACS by cardiac enzymes, chest x-ray showed no acute findings, and ECG showed no acute ST segment changes. No additional cardiac testing was performed at that time.  He tells me that over the last few weeks he has had increasing angina with fairly little activity, just walking around the house. He was moving some small boards to be thrown away and had to stop every few minutes related to chest pain. He has had increasing nitroglycerin requirements as well. Unfortunately, he continues to smoke cigarettes despite discussions about the critical importance of smoking cessation. He does state that he has been compliant with his medications.  Cardiac catheterization performed by Dr. Ellyn Hack in September 2015 demonstrated ostial occlusion of the LAD with diffusely diseased distal LAD that fills via patent LIMA, 80% proximal circumflex, 95% OM1/ramus, 20% in-stent restenosis within the SVG to OM1/ramus with severe distal stenosis of 95% within the graft segment, occluded mid RCA with some left-to-right collateralization, known occlusion of the SVG to PDA. Patient underwent placement of DES to the distal SVG to OM1/ramus system.  Past Medical History  Diagnosis Date  . Coronary atherosclerosis of native coronary artery     a. CABG 2003. b. NSTEMI 2010 s/p BMS to SVG-RCA c. PTCA/stent/asp thromb to SVG-RCA 01/2010, PTCA to SVG-RCA 04/2010 d. known occlusion of SVG-RCA 2013. e. NSTEMI 07/2013  s/p PTCA/DES to SVG-diagonal/intermediate/OM1.     . Mixed hyperlipidemia   . Bipolar disorder   . GERD (gastroesophageal reflux disease)   . Gout   . Chronic back pain   . Ischemic cardiomyopathy     a. EF 35-40% by cath 07/2013.  . Sinus bradycardia   . Essential hypertension   . Asthma   . Type 2 diabetes mellitus     Past Surgical History  Procedure Laterality Date  . Cholecystectomy    . Coronary artery bypass graft      2003, LIMA to LAD; SVG to diagonal; SVG to OM, ramus, and diagonal; SVG to RCA  . Pin placement left leg    . Metal plate to chin    . Left heart catheterization with coronary/graft angiogram N/A 07/14/2013    Procedure: LEFT HEART CATHETERIZATION WITH Beatrix Fetters;  Surgeon: Burnell Blanks, MD;  Location: [facility] CATH LAB;  Service: Cardiovascular;  Laterality: N/A;  . Percutaneous coronary stent intervention (pci-s)  07/14/2013    Procedure: PERCUTANEOUS CORONARY STENT INTERVENTION (PCI-S);  Surgeon: Burnell Blanks, MD;  Location: [facility] CATH LAB;  Service: Cardiovascular;;  . Left heart catheterization with coronary/graft angiogram N/A 12/29/2013    Procedure: LEFT HEART CATHETERIZATION WITH Beatrix Fetters;  Surgeon: Leonie Man, MD;  Location: [facility] CATH LAB;  Service: Cardiovascular;  Laterality: N/A;  . Left heart catheterization with coronary/graft angiogram N/A 06/19/2014    Procedure: LEFT HEART CATHETERIZATION WITH Beatrix Fetters;  Surgeon: Leonie Man, MD;  Location: [facility] CATH LAB;  Service: Cardiovascular;  Laterality: N/A;  . Cardiac catheterization  06/19/2014    Procedure: CORONARY STENT INTERVENTION;  Surgeon:  Leonie Man, MD;  Location: [facility] CATH LAB;  Service: Cardiovascular;;  DES to seg SVG Diag/OM/Ramus     Current Outpatient Prescriptions  Medication Sig Dispense Refill  . acetaminophen (TYLENOL) 325 MG tablet Take 2 tablets (650 mg total) by mouth every 4 (four) hours as needed for headache or mild pain.    Marland Kitchen albuterol (PROVENTIL HFA;VENTOLIN  HFA) 108 (90 BASE) MCG/ACT inhaler Inhale 2 puffs into the lungs every 6 (six) hours as needed for wheezing or shortness of breath.    Marland Kitchen aspirin EC 81 MG tablet Take 1 tablet (81 mg total) by mouth daily.    . Blood Pressure Monitoring KIT 1 each by Does not apply route as directed. 1 kit 0  . carvedilol (COREG) 6.25 MG tablet Take 1 tablet (6.25 mg total) by mouth 2 (two) times daily with a meal. 60 tablet 6  . clopidogrel (PLAVIX) 75 MG tablet Take 1 tablet (75 mg total) by mouth daily. 30 tablet 11  . diazepam (VALIUM) 10 MG tablet Take 10 mg by mouth 2 (two) times daily.    . furosemide (LASIX) 20 MG tablet Take 1 tablet (20 mg total) by mouth at bedtime. 30 tablet 6  . gabapentin (NEURONTIN) 300 MG capsule Take 1 capsule (300 mg total) by mouth 3 (three) times daily.    Marland Kitchen HYDROcodone-acetaminophen (NORCO) 7.5-325 MG per tablet Take 1 tablet by mouth every 6 (six) hours as needed for moderate pain.    . isosorbide mononitrate (IMDUR) 60 MG 24 hr tablet TAKE 1 TABLET BY MOUTH TWO TIMES DAILY 180 tablet 3  . lisinopril (PRINIVIL,ZESTRIL) 2.5 MG tablet Take 1 tablet (2.5 mg total) by mouth 2 (two) times daily. 180 tablet 3  . metFORMIN (GLUCOPHAGE) 1000 MG tablet Take 1,000 mg by mouth 2 (two) times daily with a meal.    . methocarbamol (ROBAXIN) 750 MG tablet Take 750 mg by mouth every 8 (eight) hours.     Marland Kitchen NITROSTAT 0.4 MG SL tablet PLACE 1 TABLET UNDER THE TONGUE EVERY 5  MINUTES FOR 3  DOSES AS NEEDED FOR CHEST PAIN. 25 tablet 3  . Omega-3 Fatty Acids (FISH OIL) 1000 MG CAPS Take 1,000-2,000 mg by mouth 2 (two) times daily. 1 IN THE MORNING AND 2 AT BEDTIME    . pantoprazole (PROTONIX) 40 MG tablet Take 40 mg by mouth daily.     . pravastatin (PRAVACHOL) 80 MG tablet Take 1 tablet (80 mg total) by mouth every evening. 90 tablet 3  . RANEXA 500 MG 12 hr tablet TAKE 1 TABLET BY MOUTH TWO TIMES DAILY 120 tablet 2   No current facility-administered medications for this visit.    Allergies:   Review of patient's allergies indicates no known allergies.   Social History: The patient  reports that he has been smoking Cigarettes.  He started smoking about 36 years ago. He has a 26.4 pack-year smoking history. He has never used smokeless tobacco. He reports that he uses illicit drugs. He reports that he does not drink alcohol.   ROS:  Please see the history of present illness. Otherwise, complete review of systems is positive for insomnia the last few nights, he has been very fatigued.  All other systems are reviewed and negative.   Physical Exam: VS:  BP 102/68 mmHg  Pulse 72  Ht 5' 4"  (1.626 m)  Wt 212 lb (96.163 kg)  BMI 36.37 kg/m2  SpO2 93%, BMI Body mass index is 36.37 kg/(m^2).  Wt Readings  from Last 3 Encounters:  01/27/15 212 lb (96.163 kg)  01/16/15 212 lb (96.163 kg)  12/30/14 212 lb (96.163 kg)     Overweight male no distress.  HEENT: Conjunctiva and lids normal, oropharynx clear with poor dentition.  Neck: Supple, no elevated JVP or carotid bruits, no thyromegaly.  Lungs: Clear to auscultation, nonlabored breathing at rest.  Cardiac: Regular rate and rhythm, no S3, no pericardial rub.  Abdomen: Soft, nontender, bowel sounds present.  Extremities: No pitting edema, distal pulses 2+.  Skin: Warm and dry, scattered tattoos. Muscular skeletal: No kyphosis. Neuropsychiatric: Alert and oriented 3, somewhat flat affect.   ECG: Recent tracing reviewed.   Recent Labwork: 01/16/2015: ALT 21; AST 19; BUN 9; Creatinine 0.76; Hemoglobin 13.7; Platelets 164; Potassium 3.9; Sodium 137     Component Value Date/Time   CHOL 113 12/28/2013 0530   TRIG 204* 12/28/2013 0530   HDL 25* 12/28/2013 0530   CHOLHDL 4.5 12/28/2013 0530   VLDL 41* 12/28/2013 0530   LDLCALC 47 12/28/2013 0530    Assessment and Plan:  1. Accelerating angina over the last few weeks. Mr. Winchell has fairly aggressive cardiovascular disease complicated by ongoing tobacco abuse. He was recently  hospitalized at [facility] with negative cardiac markers. Symptoms have progressed since that time. His medical regimen is actually fairly optimal, blood pressure and heart rate are well controlled. We have discussed the situation, and recommendation is for him to proceed with a diagnostic heart catheterization to assess for any potential revascularization options that might help his symptoms. He is in agreement to proceed and this is being scheduled for tomorrow with Dr. Ellyn Hack.  2. Multivessel CAD status post CABG with subsequent graft disease and percutaneous interventions, most recently DES to the SVG to ramus/obtuse marginal system.  3. Ischemic cardiomyopathy with LVEF approximately 40% by Cardiolite in September 2015.  4. Ongoing tobacco abuse. We have discussed smoking cessation on many occasions, he has not been able to quit.  5. Type 2 diabetes mellitus.  6. Hyperlipidemia.  Current medicines were reviewed with the patient today.   Disposition: FU with me after cardiac catheterization.   Signed, Satira Sark, MD, [facility] 01/27/2015 2:43 PM    [facility] [place], [place], [place] [address] Phone: [phone]; Fax: [phone]

## 2015-01-28 NOTE — CV Procedure (Signed)
CARDIAC CATHETERIZATION REPORT  NAME:  [name] [name]   MRN: [id number] DOB:  1966/06/22   ADMIT DATE: 01/28/2015 Procedure Date: 01/28/2015  INTERVENTIONAL CARDIOLOGIST: Leonie Man, M.D., MS PRIMARY CARE PROVIDER: [name], MD PRIMARY CARDIOLOGIST: Satira Sark, M.D., F.A.C.C.  PATIENT:  [name] [name] is a 49 y.o. male with h/o CAD s/p CABG x 3 (08/2002), hyperlipidemia, chronic smoker, ischemic cardiomyopathy w/ EF 35-40%, with most recent NSTEMI in 07/2013; recent cath at that time showing patent LIMA to LAD, occluded SVG to RCA and high grade disease in SVG to dignonal/ramus/OM1 s/p DES placement (3.0 mm x 16 mm). In 12/2013, he presented with Unstable Angina & underwent Cutting Balloon PTCA to ISR of SVG-RI.  In Sept 2015, he had DES PCI of the distal-anastomotic SVG-Ramus with a Xience Alpine DES 3.0 mm x 18 mm.  After each PCI, he was pain free despite having a chronic Native LCA lesion. He now presents with ~1 month of progressively worsening Angina - Class IV, and is referred for Cardiac Catheterization +/- PCI.  PRE-OPERATIVE DIAGNOSIS:    Class IV Angina  Multivessel Native & Graft CAD  2 Site PCI of SVG-Ramus with known occlusion of SVG-RCA.  PROCEDURES PERFORMED:    Left Heart Catheterization with Native Coronary And Graft Angiography via Left Radial Artery   Left Ventriculography  PROCEDURE: The patient was brought to the [facility] in the fasting state and prepped and draped in the usual sterile fashion for Left Radial artery access. A modified Allen's test was performed on the left wrist demonstrating excellent collateral flow for radial access.   Sterile technique was used including antiseptics, cap, gloves, gown, hand hygiene, mask and sheet. Skin prep: Chlorhexidine.   Consent: Risks of procedure as well as the alternatives and risks of each were explained to the (patient/caregiver). Consent for procedure  obtained.   Time Out: Verified patient identification, verified procedure, site/side was marked, verified correct patient position, special equipment/implants available, medications/allergies/relevent history reviewed, required imaging and test results available. Performed.  Access:   Left Radial Artery: 6 Fr Sheath -  Seldinger Technique (Angiocath Micropuncture Kit)  Radial Cocktail - 5 mL; IV Heparin 5000 Units   Left Heart Catheterization: 5Fr Catheters advanced or exchanged over a long-exchange safety J-wire - advanced under fluoroscopic guidance into the ascending aorta. JR4 catheter advanced first.  Left Coronary Artery Cineangiography: JL4 Catheter  Right Coronary Artery, SVG-RCA & SVG-RamusCineangiography: JR4 Catheter  LIMA-LAD Cineangiography: JR4 Catheter redirected into Left Subclavian Artery.  LV Hemodynamics (LV Gram): Angled Pigtail  Sheath removed in the Cardiac Cath Lab with TR Band for hemostasis.  TR Band: 0905  Hours; 11 mL air  FINDINGS:  Hemodynamics:   Central Aortic Pressure / Mean: 91/57/74 mmHg  Left Ventricular Pressure / LVEDP: 91/14/21 mmHg  Left Ventriculography:  EF: ~45-50 %  Wall Motion: basal to mid inferior hypokinesis.  Coronary Anatomy:  Dominance: Right  Left Main: long, moderate to large caliber; diffusely diseased; trifurcates to occluded LAD & ostially diseased Ramus Intermedius & Circumflex. LAD: 100% ostial occlusion;; fills via Patent LIMA that retrograde fills a proximal D1; antegrade flow shows a diffusely diseased distal LAD that wraps the apex. Septal perforator collaterals fill the entire RPDA.   LIMA-LAD: Widely patent graft, tortuous; angiographically normal.  Left Circumflex: diffuse disease with ostial/proximal ~80%. Distally, small vessel with no notable OM branches. OM1/RI - ~95% early mid stenosis (stable)   SVG-RI (~D1)-OM1: Ostial stent with  mid stent with roughly 20 % focal in-stent restenosis. The distal graft  anastomosis into the native vessel has a widely patent graft.  No disease distally.   RCA: Large caliber vessel that is abruptly occluded in the early mid-segment. RPDA: fills via L-R collaterals from sepal perforators; faint retrograde filling of the Right Posterolateral system.  SVG-RPDA: Known to be occluded. No visualized with cineangiograhy.  After reviewing the initial angiography, the Native LCA lesion is chronic - with no change from March 2015.  This is a chronic lesion & not likely the cause of new onset / recurrent angina.  MEDICATIONS:  Anesthesia:  Local Lidocaine 2 ml  Sedation:  1 mg IV Versed  Omnipaque Contrast: 110 ml  Anticoagulation:  IV Heparin 5000 Units   Anti-Platelet Agent:  He is on ASA/Plavix Radial Cocktail: 5 mg Verapamil, 400 mcg NTG, 2 ml 2% Lidocaine in 10 ml NS - 1/2 dose given  PATIENT DISPOSITION:    The patient was transferred to the PACU holding area in a hemodynamicaly stable, chest pain free condition.  The patient tolerated the procedure well, and there were no complications.  EBL:   < 10 ml  The patient was stable before, during, and after the procedure.  POST-OPERATIVE DIAGNOSIS:    Severe native and graft CAD as previously described with widely patent stents in the SVG-Ramus from 2015.   No new lesion to explain the new recurrence of Angina.  Mildly reduced LVEF with mildly elevated LVEDP despite borderline systemic hypotension.  PLAN OF CARE:  Standard Post Radial Cath care with TR band removal.  Expect discharge home after bedrest.  Aggressive Medical Rx with Risk Factor modification  Smoking cessation counseling strongly recommended.  Would only consider PCI of chronic LCA-Cx lesion if Anginal symptoms are intractable.    Leonie Man, M.D., M.S. Interventional Cardiologist   Pager # [phone]

## 2015-01-28 NOTE — Interval H&P Note (Signed)
History and Physical Interval Note:  01/28/2015 8:16 AM  [name] [name]  has presented today for surgery, with the diagnosis of excellerating angina - Class IV.   The various methods of treatment have been discussed with the patient and family. After consideration of risks, benefits and other options for treatment, the patient has consented to  Procedure(s): LEFT HEART CATHETERIZATION WITH CORONARY ANGIOGRAM (N/A) as a surgical intervention .  The patient's history has been reviewed, patient examined, no change in status, stable for surgery.  I have reviewed the patient's chart and labs.  Questions were answered to the patient's satisfaction.    Cath Lab Visit (complete for each Cath Lab visit)  Clinical Evaluation Leading to the Procedure:   ACS: No.  Non-ACS:    Anginal Classification: CCS IV  Anti-ischemic medical therapy: Maximal Therapy (2 or more classes of medications)  Non-Invasive Test Results: No non-invasive testing performed  Prior CABG: Previous CABG  [name] [name]  AUC: A conclusive assessment cannot be generated from this patient's information.  His conditions do not match a generated criteria.   [name] Lex, MD

## 2015-01-29 ENCOUNTER — Encounter (HOSPITAL_COMMUNITY): Payer: Self-pay | Admitting: Cardiology

## 2015-02-01 ENCOUNTER — Other Ambulatory Visit: Payer: Self-pay | Admitting: Cardiology

## 2015-03-01 ENCOUNTER — Telehealth: Payer: Self-pay | Admitting: Cardiology

## 2015-03-02 ENCOUNTER — Other Ambulatory Visit: Payer: Self-pay | Admitting: *Deleted

## 2015-03-02 MED ORDER — NITROGLYCERIN 0.4 MG SL SUBL: 0.4000 mg | SUBLINGUAL_TABLET | SUBLINGUAL | Status: DC | PRN

## 2015-03-02 NOTE — Telephone Encounter (Signed)
He is not yet one year out from his most recent DES intervention which was in September 2015. If the colonoscopy is elective and could be rescheduled, I would not recommend stopping Plavix until September of this year.

## 2015-03-02 NOTE — Telephone Encounter (Signed)
Patient informed and verbalized understanding of plan. Patient said he would contact the GI doctor and let them know to hold off until September 2016.

## 2015-04-13 ENCOUNTER — Ambulatory Visit (INDEPENDENT_AMBULATORY_CARE_PROVIDER_SITE_OTHER): Payer: Medicare Other | Admitting: Cardiology

## 2015-04-13 ENCOUNTER — Encounter: Payer: Self-pay | Admitting: Cardiology

## 2015-04-13 VITALS — BP 98/64 | HR 69 | Ht 64.0 in | Wt 207.0 lb

## 2015-04-13 DIAGNOSIS — I255 Ischemic cardiomyopathy: Secondary | ICD-10-CM | POA: Diagnosis not present

## 2015-04-13 DIAGNOSIS — F17200 Nicotine dependence, unspecified, uncomplicated: Secondary | ICD-10-CM

## 2015-04-13 DIAGNOSIS — Z720 Tobacco use: Secondary | ICD-10-CM

## 2015-04-13 DIAGNOSIS — I25119 Atherosclerotic heart disease of native coronary artery with unspecified angina pectoris: Secondary | ICD-10-CM | POA: Diagnosis not present

## 2015-04-13 DIAGNOSIS — I10 Essential (primary) hypertension: Secondary | ICD-10-CM | POA: Diagnosis not present

## 2015-04-13 NOTE — Progress Notes (Signed)
Cardiology Office Note  Date: 04/13/2015   ID: CLAUDIA ALVIZO, DOB 1965-12-23, MRN [id number]  PCP: [name], MD  Primary Cardiologist: [name], MD   Chief Complaint  Patient presents with  . Coronary Artery Disease  . Cardiomyopathy    History of Present Illness: [name] [name] is a medically complex 49 y.o. male last seen in April. At that time he was referred for a cardiac catheterization with accelerating angina symptoms. He was found to have native and graft disease as documented previously with patent stent site within the SVG to ramus/OM/diagonal system. No obvious culprit for intervention and medical therapy was recommended.  He presents for a routine follow-up visit, reports stable angina symptoms on current medical regimen. He reports compliance with his medications. Still tries to stay active, we discussed avoiding the hottest part of the day outdoors.  Continue to address smoking cessation, although he has not been able to quit.   Past Medical History  Diagnosis Date  . Coronary atherosclerosis of native coronary artery     a. CABG 2003. b. NSTEMI 2010 s/p BMS to SVG-RCA c. PTCA/stent/asp thromb to SVG-RCA 01/2010, PTCA to SVG-RCA 04/2010 d. known occlusion of SVG-RCA 2013. e. NSTEMI 07/2013  s/p PTCA/DES to SVG-diagonal/intermediate/OM1.   . Mixed hyperlipidemia   . Bipolar disorder   . GERD (gastroesophageal reflux disease)   . Gout   . Chronic back pain   . Ischemic cardiomyopathy     a. EF 35-40% by cath 07/2013.  . Sinus bradycardia   . Essential hypertension   . Asthma   . Type 2 diabetes mellitus     Current Outpatient Prescriptions  Medication Sig Dispense Refill  . acetaminophen (TYLENOL) 325 MG tablet Take 2 tablets (650 mg total) by mouth every 4 (four) hours as needed for headache or mild pain.    Marland Kitchen albuterol (PROVENTIL HFA;VENTOLIN HFA) 108 (90 BASE) MCG/ACT inhaler Inhale 2 puffs into the lungs every 6 (six) hours as needed for  wheezing or shortness of breath.    Marland Kitchen aspirin EC 81 MG tablet Take 1 tablet (81 mg total) by mouth daily.    . Blood Pressure Monitoring KIT 1 each by Does not apply route as directed. 1 kit 0  . carvedilol (COREG) 6.25 MG tablet TAKE 1 TABLET BY MOUTH TWO TIMES DAILY WITH A MEAL 60 tablet 2  . clopidogrel (PLAVIX) 75 MG tablet Take 1 tablet (75 mg total) by mouth daily. 30 tablet 11  . colchicine 0.6 MG tablet Take 0.6 mg by mouth 2 (two) times daily as needed.  0  . diazepam (VALIUM) 10 MG tablet Take 10 mg by mouth 2 (two) times daily.    . furosemide (LASIX) 20 MG tablet Take 1 tablet (20 mg total) by mouth at bedtime. 30 tablet 6  . gabapentin (NEURONTIN) 300 MG capsule Take 600 mg by mouth 3 (three) times daily.     Marland Kitchen HYDROcodone-acetaminophen (NORCO) 7.5-325 MG per tablet Take 1 tablet by mouth every 6 (six) hours as needed for moderate pain.    . isosorbide mononitrate (IMDUR) 60 MG 24 hr tablet TAKE 1 TABLET BY MOUTH TWO TIMES DAILY 180 tablet 3  . lisinopril (PRINIVIL,ZESTRIL) 2.5 MG tablet Take 1 tablet (2.5 mg total) by mouth 2 (two) times daily. 180 tablet 3  . metFORMIN (GLUCOPHAGE) 1000 MG tablet Take 1,000 mg by mouth 2 (two) times daily with a meal.    . methocarbamol (ROBAXIN) 750 MG tablet Take 750  mg by mouth every 8 (eight) hours.     . nitroGLYCERIN (NITROSTAT) 0.4 MG SL tablet Place 1 tablet (0.4 mg total) under the tongue every 5 (five) minutes x 3 doses as needed for chest pain. 25 tablet 3  . Omega-3 Fatty Acids (FISH OIL) 1000 MG CAPS Take 1,000-2,000 mg by mouth 2 (two) times daily. 1 IN THE MORNING AND 2 AT BEDTIME    . pantoprazole (PROTONIX) 40 MG tablet Take 40 mg by mouth daily.     . pravastatin (PRAVACHOL) 80 MG tablet Take 1 tablet (80 mg total) by mouth every evening. 90 tablet 3  . RANEXA 500 MG 12 hr tablet TAKE 1 TABLET BY MOUTH TWO TIMES DAILY 60 tablet 2   No current facility-administered medications for this visit.    Allergies:  Review of patient's  allergies indicates no known allergies.   Social History: The patient  reports that he has been smoking Cigarettes.  He started smoking about 36 years ago. He has a 26.4 pack-year smoking history. He has never used smokeless tobacco. He reports that he uses illicit drugs. He reports that he does not drink alcohol.   ROS:  Please see the history of present illness. Otherwise, complete review of systems is positive for mild arthritic pains.  All other systems are reviewed and negative.   Physical Exam: VS:  BP 98/64 mmHg  Pulse 69  Ht 5' 4"  (1.626 m)  Wt 207 lb (93.895 kg)  BMI 35.51 kg/m2  SpO2 95%, BMI Body mass index is 35.51 kg/(m^2).  Wt Readings from Last 3 Encounters:  04/13/15 207 lb (93.895 kg)  01/28/15 212 lb (96.163 kg)  01/27/15 212 lb (96.163 kg)     Overweight male no distress.  HEENT: Conjunctiva and lids normal, oropharynx clear with poor dentition.  Neck: Supple, no elevated JVP or carotid bruits, no thyromegaly.  Lungs: Clear to auscultation, nonlabored breathing at rest.  Cardiac: Regular rate and rhythm, no S3, no pericardial rub.  Abdomen: Soft, nontender, bowel sounds present.  Extremities: No pitting edema, distal pulses 2+.  Skin: Warm and dry, scattered tattoos. Musculoskeletal: No kyphosis. Neuropsychiatric: Alert and oriented 3, somewhat flat affect.   ECG: ECG is not ordered today.   Recent Labwork: 01/16/2015: ALT 21; AST 19; BUN 9; Creatinine, Ser 0.76; Hemoglobin 13.7; Platelets 164; Potassium 3.9; Sodium 137     Component Value Date/Time   CHOL 113 12/28/2013 0530   TRIG 204* 12/28/2013 0530   HDL 25* 12/28/2013 0530   CHOLHDL 4.5 12/28/2013 0530   VLDL 41* 12/28/2013 0530   LDLCALC 47 12/28/2013 0530    Assessment and Plan:  1. Multivessel CAD disease status post CABG with known graft disease, most recent intervention was DES to the SVG to diagonal/ramus/OM system in September 2015. No significant change in anatomy by cardiac  catheterization earlier this year. Continue medical therapy including the DAPT.  2. Ongoing tobacco abuse, he has not been able to quit.  3. Essential hypertension, blood pressure has been well controlled.  4. History of cardiomyopathy, LVEF 45-50% with mid to basal inferior hypokinesis by most recent cardiac catheterization.  Current medicines were reviewed with the patient today.  Disposition: FU with me in 3 months.   Signed, Satira Sark, MD, [facility] 04/13/2015 12:01 PM    [facility] [place], [place], [place] [address] Phone: [phone]; Fax: [phone]

## 2015-04-13 NOTE — Patient Instructions (Signed)
Your physician recommends that you continue on your current medications as directed. Please refer to the Current Medication list given to you today. Your physician recommends that you schedule a follow-up appointment in: 3 months. You will receive a reminder letter in the mail in about 1-2 months reminding you to call and schedule your appointment. If you don't receive this letter, please contact our office. 

## 2015-04-29 ENCOUNTER — Other Ambulatory Visit: Payer: Self-pay | Admitting: *Deleted

## 2015-04-29 MED ORDER — RANOLAZINE ER 500 MG PO TB12: 500.0000 mg | ORAL_TABLET | Freq: Two times a day (BID) | ORAL | Status: DC

## 2015-04-29 MED ORDER — CARVEDILOL 6.25 MG PO TABS: 6.2500 mg | ORAL_TABLET | Freq: Two times a day (BID) | ORAL | Status: DC

## 2015-04-29 NOTE — Telephone Encounter (Signed)
Coreg & Ranexa refilled today.

## 2015-05-25 ENCOUNTER — Other Ambulatory Visit: Payer: Self-pay | Admitting: Cardiology

## 2015-06-04 ENCOUNTER — Telehealth: Payer: Self-pay | Admitting: Cardiology

## 2015-06-04 ENCOUNTER — Encounter: Payer: Self-pay | Admitting: *Deleted

## 2015-06-04 NOTE — Telephone Encounter (Signed)
Spoke with patient and he informed nurse that he saw Dr. Camelia Eng last week for cramping in his legs. Patient said the PCP did lab work on him and said everything was okay. Nurse advised patient that leg pain could be an indication of blockages or a problem with the blood flow in his legs. No c/o chest pain, dizziness or sob. Nurse advised patient to contact Dr. Santiago Glad office and him know that he is still having issues with his legs since. Patient verbalized understanding of plan.

## 2015-06-04 NOTE — Telephone Encounter (Signed)
Patient wants to know if having blockages can make your legs cramp. / tg

## 2015-06-23 ENCOUNTER — Other Ambulatory Visit: Payer: Self-pay | Admitting: Cardiology

## 2015-07-13 ENCOUNTER — Encounter: Payer: Self-pay | Admitting: Cardiology

## 2015-07-13 ENCOUNTER — Ambulatory Visit (INDEPENDENT_AMBULATORY_CARE_PROVIDER_SITE_OTHER): Payer: Medicare Other | Admitting: Cardiology

## 2015-07-13 VITALS — BP 138/82 | HR 61 | Ht 63.0 in | Wt 206.0 lb

## 2015-07-13 DIAGNOSIS — I25119 Atherosclerotic heart disease of native coronary artery with unspecified angina pectoris: Secondary | ICD-10-CM

## 2015-07-13 DIAGNOSIS — I255 Ischemic cardiomyopathy: Secondary | ICD-10-CM | POA: Diagnosis not present

## 2015-07-13 DIAGNOSIS — F17200 Nicotine dependence, unspecified, uncomplicated: Secondary | ICD-10-CM

## 2015-07-13 DIAGNOSIS — E782 Mixed hyperlipidemia: Secondary | ICD-10-CM | POA: Diagnosis not present

## 2015-07-13 NOTE — Patient Instructions (Signed)
Your physician recommends that you continue on your current medications as directed. Please refer to the Current Medication list given to you today. Your physician recommends that you schedule a follow-up appointment in: 4 months. You will receive a reminder letter in the mail in about 1-2 months reminding you to call and schedule your appointment. If you don't receive this letter, please contact our office. 

## 2015-07-13 NOTE — Progress Notes (Signed)
Cardiology Office Note  Date: 07/13/2015   ID: [name] [name], DOB 1966-01-23, MRN [id number]  PCP: [name], MD  Primary Cardiologist: [name], MD   Chief Complaint  Patient presents with  . Coronary Artery Disease    History of Present Illness: [name] [name] is a medically complex 49 y.o. male last seen in July. He presents for a routine follow-up visit. He reports intermittent angina symptoms as before, mainly seems to be triggered by emotional upset. He has been going through a lot in terms of psychosocial stress at home based on his discussion today.  He reports compliance with his medications which are outlined below. We have not made any major changes recently. He continues to follow with Dr. Sherrie Sport.  He has multivessel CAD status post CABG with known graft disease, last intervention being DES to the SVG to diagonal/ramus/OM system in September 2015. Cardiac catheterization in April of this year showed stent patency with no other obvious culprits to require intervention at that time. We have been managing him medically.  He continues to smoke cigarettes, but states that he is trying to cut back and quit. This is been very difficult for him over time.   Past Medical History  Diagnosis Date  . Coronary atherosclerosis of native coronary artery     a. CABG 2003. b. NSTEMI 2010 s/p BMS to SVG-RCA c. PTCA/stent/asp thromb to SVG-RCA 01/2010, PTCA to SVG-RCA 04/2010 d. known occlusion of SVG-RCA 2013. e. NSTEMI 07/2013  s/p PTCA/DES to SVG-diagonal/intermediate/OM1.   . Mixed hyperlipidemia   . Bipolar disorder ([place])   . GERD (gastroesophageal reflux disease)   . Gout   . Chronic back pain   . Ischemic cardiomyopathy     a. EF 35-40% by cath 07/2013.  . Sinus bradycardia   . Essential hypertension   . Asthma   . Type 2 diabetes mellitus [facility])     Past Surgical History  Procedure Laterality Date  . Cholecystectomy    . Coronary artery bypass graft     2003, LIMA to LAD; SVG to diagonal; SVG to OM, ramus, and diagonal; SVG to RCA  . Pin placement left leg    . Metal plate to chin    . Left heart catheterization with coronary/graft angiogram N/A 07/14/2013    Procedure: LEFT HEART CATHETERIZATION WITH Beatrix Fetters;  Surgeon: Burnell Blanks, MD;  Location[facility] CATH LAB;  Service: Cardiovascular;  Laterality: N/A;  . Percutaneous coronary stent intervention (pci-s)  07/14/2013    Procedure: PERCUTANEOUS CORONARY STENT INTERVENTION (PCI-S);  Surgeon: Burnell Blanks, MD;  Location: [facility] CATH LAB;  Service: Cardiovascular;;  . Left heart catheterization with coronary/graft angiogram N/A 12/29/2013    Procedure: LEFT HEART CATHETERIZATION WITH Beatrix Fetters;  Surgeon: Leonie Man, MD;  Location: [facility] CATH LAB;  Service: Cardiovascular;  Laterality: N/A;  . Left heart catheterization with coronary/graft angiogram N/A 06/19/2014    Procedure: LEFT HEART CATHETERIZATION WITH Beatrix Fetters;  Surgeon: Leonie Man, MD;  Location[facility] CATH LAB;  Service: Cardiovascular;  Laterality: N/A;  . Cardiac catheterization  06/19/2014    Procedure: CORONARY STENT INTERVENTION;  Surgeon: Leonie Man, MD;  Location: [facility] CATH LAB;  Service: Cardiovascular;;  DES to seg SVG Diag/OM/Ramus   . Left heart catheterization with coronary angiogram N/A 01/28/2015    Procedure: LEFT HEART CATHETERIZATION WITH CORONARY ANGIOGRAM;  Surgeon: Leonie Man, MD;  Location: [facility] CATH LAB;  Service: Cardiovascular;  Laterality: N/A;  Current Outpatient Prescriptions  Medication Sig Dispense Refill  . acetaminophen (TYLENOL) 325 MG tablet Take 2 tablets (650 mg total) by mouth every 4 (four) hours as needed for headache or mild pain.    Marland Kitchen albuterol (PROVENTIL HFA;VENTOLIN HFA) 108 (90 BASE) MCG/ACT inhaler Inhale 2 puffs into the lungs every 6 (six) hours as needed for wheezing or shortness of breath.    Marland Kitchen aspirin EC 81 MG tablet Take  1 tablet (81 mg total) by mouth daily.    . Blood Pressure Monitoring KIT 1 each by Does not apply route as directed. 1 kit 0  . carvedilol (COREG) 6.25 MG tablet Take 1 tablet (6.25 mg total) by mouth 2 (two) times daily. 60 tablet 6  . clopidogrel (PLAVIX) 75 MG tablet TAKE 1 TABLET BY MOUTH EVERY DAY 30 tablet 10  . colchicine 0.6 MG tablet Take 0.6 mg by mouth 2 (two) times daily as needed.  0  . diazepam (VALIUM) 10 MG tablet Take 10 mg by mouth 2 (two) times daily.    . furosemide (LASIX) 20 MG tablet TAKE 1 TABLET BY MOUTH AT BEDTIME 30 tablet 3  . gabapentin (NEURONTIN) 300 MG capsule Take 600 mg by mouth 3 (three) times daily.     Marland Kitchen HYDROcodone-acetaminophen (NORCO) 7.5-325 MG per tablet Take 1 tablet by mouth every 6 (six) hours as needed for moderate pain.    . isosorbide mononitrate (IMDUR) 60 MG 24 hr tablet TAKE 1 TABLET BY MOUTH TWO TIMES DAILY 180 tablet 3  . lisinopril (PRINIVIL,ZESTRIL) 2.5 MG tablet Take 1 tablet (2.5 mg total) by mouth 2 (two) times daily. 180 tablet 3  . metFORMIN (GLUCOPHAGE) 1000 MG tablet Take 1,000 mg by mouth 2 (two) times daily with a meal.    . methocarbamol (ROBAXIN) 750 MG tablet Take 750 mg by mouth every 8 (eight) hours.     . nitroGLYCERIN (NITROSTAT) 0.4 MG SL tablet Place 1 tablet (0.4 mg total) under the tongue every 5 (five) minutes x 3 doses as needed for chest pain. 25 tablet 3  . Omega-3 Fatty Acids (FISH OIL) 1000 MG CAPS Take 1,000-2,000 mg by mouth 2 (two) times daily. 1 IN THE MORNING AND 2 AT BEDTIME    . pantoprazole (PROTONIX) 40 MG tablet Take 40 mg by mouth daily.     . pravastatin (PRAVACHOL) 80 MG tablet Take 1 tablet (80 mg total) by mouth every evening. 90 tablet 3  . ranolazine (RANEXA) 500 MG 12 hr tablet Take 1 tablet (500 mg total) by mouth 2 (two) times daily. 60 tablet 6   No current facility-administered medications for this visit.    Allergies:  Review of patient's allergies indicates no known allergies.   Social  History: The patient  reports that he has been smoking Cigarettes.  He started smoking about 37 years ago. He has a 26.4 pack-year smoking history. He has never used smokeless tobacco. He reports that he uses illicit drugs. He reports that he does not drink alcohol.   ROS:  Please see the history of present illness. Otherwise, complete review of systems is positive for anxiety.  All other systems are reviewed and negative.   Physical Exam: VS:  BP 138/82 mmHg  Pulse 61  Ht _0  (1.6 m)  Wt 206 lb (93.441 kg)  BMI 36.50 kg/m2  SpO2 97%, BMI Body mass index is 36.5 kg/(m^2).  Wt Readings from Last 3 Encounters:  07/13/15 206 lb (93.441 kg)  04/13/15 207 lb (  93.895 kg)  01/28/15 212 lb (96.163 kg)     Overweight male no distress.  HEENT: Conjunctiva and lids normal, oropharynx clear with poor dentition.  Neck: Supple, no elevated JVP or carotid bruits, no thyromegaly.  Lungs: Clear to auscultation, nonlabored breathing at rest.  Cardiac: Regular rate and rhythm, no S3, no pericardial rub.  Abdomen: Soft, nontender, bowel sounds present.  Extremities: No pitting edema, distal pulses 2+.  Skin: Warm and dry, scattered tattoos. Musculoskeletal: No kyphosis. Neuropsychiatric: Alert and oriented 3, somewhat flat affect.   ECG: ECG is not ordered today.   Recent Labwork:  August 2016: Hemoglobin A1c 5.6, BUN 10, creatinine 0.6, potassium 4.5  Assessment and Plan:  1. Multivessel CAD status post CABG with graft disease and most recent intervention including DES to the SVG to diagonal/ ramus/OM system in October 2014. Anatomy was stable by cardiac catheterization earlier this year. He continues to have intermittent angina.  Plan to continue medical therapy for now.  2. Ongoing tobacco abuse. We continue to address smoking cessation. He is trying to cut back.  3. Hyperlipidemia, on Pravachol and omega-3 supplements. Lab work followed by Dr. Sherrie Sport.  4. Cardiomyopathy , LVEF  45-50% by most recent cardiac catheterization.  Current medicines were reviewed with the patient today.  Disposition: FU with me in 4 months.   Signed, Satira Sark, MD, [facility] 07/13/2015 10:48 AM    [facility] [place], [place], [place] [address] Phone: [phone]; Fax: [phone]

## 2015-07-22 ENCOUNTER — Other Ambulatory Visit: Payer: Self-pay | Admitting: *Deleted

## 2015-07-22 MED ORDER — LISINOPRIL 2.5 MG PO TABS: 2.5000 mg | ORAL_TABLET | Freq: Two times a day (BID) | ORAL | Status: DC

## 2015-09-09 ENCOUNTER — Telehealth: Payer: Self-pay | Admitting: Cardiology

## 2015-09-09 NOTE — Telephone Encounter (Signed)
Patient c/o chest pain rated #2 with some nausea after taking 2 storage bens of clothes and Christmas items to the storage building. Patient said he took a nitroglycerin tablet times one and the chest pain resolved. No c/o sob or dizziness. Patient informed nurse that he was under a lot of stress regarding buying Christmas gifts. Patient was given an appointment to see Dr. Diona Browner on 10/07/15 and informed that if his symptoms returned or got worse, that he needed to to go to the ED for an evaluation. Patient verbalized understanding of plan.

## 2015-09-09 NOTE — Telephone Encounter (Signed)
Patient carried something out to the storage building and almost passed out.  He was nauseous and had to take a nitro.  This happened about a hour ago and stated that he is doing ok now, but wanted to talk with [place] about it  [phone]

## 2015-09-21 ENCOUNTER — Other Ambulatory Visit: Payer: Self-pay | Admitting: *Deleted

## 2015-09-21 ENCOUNTER — Other Ambulatory Visit: Payer: Self-pay

## 2015-09-21 MED ORDER — PRAVASTATIN SODIUM 80 MG PO TABS: 80.0000 mg | ORAL_TABLET | Freq: Every evening | ORAL | Status: DC

## 2015-09-21 MED ORDER — NITROGLYCERIN 0.4 MG SL SUBL: 0.4000 mg | SUBLINGUAL_TABLET | SUBLINGUAL | Status: DC | PRN

## 2015-09-21 NOTE — Telephone Encounter (Signed)
Refill complete 

## 2015-10-07 ENCOUNTER — Ambulatory Visit (INDEPENDENT_AMBULATORY_CARE_PROVIDER_SITE_OTHER): Payer: Medicare Other | Admitting: Cardiology

## 2015-10-07 ENCOUNTER — Encounter: Payer: Self-pay | Admitting: Cardiology

## 2015-10-07 VITALS — BP 108/78 | HR 58 | Ht 63.0 in | Wt 211.0 lb

## 2015-10-07 DIAGNOSIS — I25119 Atherosclerotic heart disease of native coronary artery with unspecified angina pectoris: Secondary | ICD-10-CM | POA: Diagnosis not present

## 2015-10-07 DIAGNOSIS — I10 Essential (primary) hypertension: Secondary | ICD-10-CM | POA: Diagnosis not present

## 2015-10-07 DIAGNOSIS — F17200 Nicotine dependence, unspecified, uncomplicated: Secondary | ICD-10-CM

## 2015-10-07 NOTE — Patient Instructions (Signed)
Continue all current medications. Your physician wants you to follow up in:  4 months.  You will receive a reminder letter in the mail one-two months in advance.  If you don't receive a letter, please call our office to schedule the follow up appointment   

## 2015-10-07 NOTE — Progress Notes (Signed)
Cardiology Office Note  Date: 10/07/2015   ID: JOACHIM CARTON, DOB November 17, 1965, MRN [id number]  PCP: [name], MD  Primary Cardiologist: [name], MD   Chief Complaint  Patient presents with  . Coronary Artery Disease  . Cardiomyopathy    History of Present Illness: [name] [name] is a medically complex 49 y.o. male last seen in October. He presents for a follow-up visit. Telephone notes reviewed, he had experienced worsening angina symptoms earlier in December in the setting of emotional stress and also physical activity. Since that time he reports no further angina on medical therapy, and thinks that a lot of this may be more related to emotional stress. He indicates having difficulties with his wife at home.  He has multivessel CAD status post CABG with known graft disease, last intervention being DES to the SVG to diagonal/ramus/OM system in September 2015. Cardiac catheterization in April of this year showed stent patency with no other obvious culprits to require intervention at that time. We have been managing him medically.  ECG today shows sinus bradycardia with old inferior infarct pattern and nonspecific T-wave changes.  Past Medical History  Diagnosis Date  . Coronary atherosclerosis of native coronary artery     a. CABG 2003. b. NSTEMI 2010 s/p BMS to SVG-RCA c. PTCA/stent/asp thromb to SVG-RCA 01/2010, PTCA to SVG-RCA 04/2010 d. known occlusion of SVG-RCA 2013. e. NSTEMI 07/2013  s/p PTCA/DES to SVG-diagonal/intermediate/OM1.   . Mixed hyperlipidemia   . Bipolar disorder ([place])   . GERD (gastroesophageal reflux disease)   . Gout   . Chronic back pain   . Ischemic cardiomyopathy     a. EF 35-40% by cath 07/2013.  . Sinus bradycardia   . Essential hypertension   . Asthma   . Type 2 diabetes mellitus ([place])     Current Outpatient Prescriptions  Medication Sig Dispense Refill  . acetaminophen (TYLENOL) 325 MG tablet Take 2 tablets (650 mg total) by  mouth every 4 (four) hours as needed for headache or mild pain.    Marland Kitchen albuterol (PROVENTIL HFA;VENTOLIN HFA) 108 (90 BASE) MCG/ACT inhaler Inhale 2 puffs into the lungs every 6 (six) hours as needed for wheezing or shortness of breath.    Marland Kitchen aspirin EC 81 MG tablet Take 1 tablet (81 mg total) by mouth daily.    . Blood Pressure Monitoring KIT 1 each by Does not apply route as directed. 1 kit 0  . carvedilol (COREG) 6.25 MG tablet Take 1 tablet (6.25 mg total) by mouth 2 (two) times daily. 60 tablet 6  . clopidogrel (PLAVIX) 75 MG tablet TAKE 1 TABLET BY MOUTH EVERY DAY 30 tablet 10  . colchicine 0.6 MG tablet Take 0.6 mg by mouth 2 (two) times daily as needed.  0  . diazepam (VALIUM) 10 MG tablet Take 10 mg by mouth 2 (two) times daily.    . furosemide (LASIX) 20 MG tablet TAKE 1 TABLET BY MOUTH AT BEDTIME 30 tablet 3  . gabapentin (NEURONTIN) 300 MG capsule Take 600 mg by mouth 3 (three) times daily.     Marland Kitchen HYDROcodone-acetaminophen (NORCO) 7.5-325 MG per tablet Take 1 tablet by mouth every 6 (six) hours as needed for moderate pain.    . isosorbide mononitrate (IMDUR) 60 MG 24 hr tablet TAKE 1 TABLET BY MOUTH TWO TIMES DAILY 180 tablet 3  . lisinopril (PRINIVIL,ZESTRIL) 2.5 MG tablet Take 1 tablet (2.5 mg total) by mouth 2 (two) times daily. 180 tablet 3  .  metFORMIN (GLUCOPHAGE) 1000 MG tablet Take 1,000 mg by mouth 2 (two) times daily with a meal.    . methocarbamol (ROBAXIN) 750 MG tablet Take 750 mg by mouth every 8 (eight) hours.     . nitroGLYCERIN (NITROSTAT) 0.4 MG SL tablet Place 1 tablet (0.4 mg total) under the tongue every 5 (five) minutes x 3 doses as needed for chest pain. If no relief after 3 rd dose, proceed to the ED for an evaluation 25 tablet 3  . Omega-3 Fatty Acids (FISH OIL) 1000 MG CAPS Take 1,000-2,000 mg by mouth 2 (two) times daily. 1 IN THE MORNING AND 2 AT BEDTIME    . pantoprazole (PROTONIX) 40 MG tablet Take 40 mg by mouth daily.     . pravastatin (PRAVACHOL) 80 MG  tablet Take 1 tablet (80 mg total) by mouth every evening. 90 tablet 3  . ranolazine (RANEXA) 500 MG 12 hr tablet Take 1 tablet (500 mg total) by mouth 2 (two) times daily. 60 tablet 6   No current facility-administered medications for this visit.   Allergies:  Review of patient's allergies indicates no known allergies.   Social History: The patient  reports that he has been smoking Cigarettes.  He started smoking about 37 years ago. He has a 26.4 pack-year smoking history. He has never used smokeless tobacco. He reports that he uses illicit drugs. He reports that he does not drink alcohol.   ROS:  Please see the history of present illness. Otherwise, complete review of systems is positive for emotional stress.  All other systems are reviewed and negative.   Physical Exam: VS:  BP 108/78 mmHg  Pulse 58  Ht 5' 3"  (1.6 m)  Wt 211 lb (95.709 kg)  BMI 37.39 kg/m2  SpO2 94%, BMI Body mass index is 37.39 kg/(m^2).  Wt Readings from Last 3 Encounters:  10/07/15 211 lb (95.709 kg)  07/13/15 206 lb (93.441 kg)  04/13/15 207 lb (93.895 kg)    Overweight male no distress.  HEENT: Conjunctiva and lids normal, oropharynx clear with poor dentition.  Neck: Supple, no elevated JVP or carotid bruits, no thyromegaly.  Lungs: Clear to auscultation, nonlabored breathing at rest.  Cardiac: Regular rate and rhythm, no S3, no pericardial rub.  Abdomen: Soft, nontender, bowel sounds present.  Extremities: No pitting edema, distal pulses 2+.   ECG: Tracing from 01/15/2015 showed sinus rhythm with nonspecific T-wave changes.  Recent Labwork:  August 2016: BUN 10, creatinine 0.6, potassium 4.5, hemoglobin A1c 5.6  Other Studies Reviewed Today:  Cardiac catheterization 01/28/2015: Left Ventriculography:  EF: ~45-50 %  Wall Motion: basal to mid inferior hypokinesis.  Coronary Anatomy:  Dominance: Right  Left Main: long, moderate to large caliber; diffusely diseased; trifurcates to occluded  LAD & ostially diseased Ramus Intermedius & Circumflex. LAD: 100% ostial occlusion;; fills via Patent LIMA that retrograde fills a proximal D1; antegrade flow shows a diffusely diseased distal LAD that wraps the apex. Septal perforator collaterals fill the entire RPDA.   LIMA-LAD: Widely patent graft, tortuous; angiographically normal.  Left Circumflex: diffuse disease with ostial/proximal ~80%. Distally, small vessel with no notable OM branches. OM1/RI - ~95% early mid stenosis (stable)   SVG-RI (~D1)-OM1: Ostial stent with mid stent with roughly 20 % focal in-stent restenosis. The distal graft anastomosis into the native vessel has a widely patent graft. No disease distally.   RCA: Large caliber vessel that is abruptly occluded in the early mid-segment. RPDA: fills via L-R collaterals from sepal perforators; faint  retrograde filling of the Right Posterolateral system.  SVG-RPDA: Known to be occluded. No visualized with cineangiograhy.  Assessment and Plan:  1. Intermittent angina symptoms, settled down at present. ECG reviewed and stable. He reports compliance with his medications, and has good blood pressure and heart rate control. Continue observation for now.  2. Multivessel CAD status post CABG with known graft disease. Cardiac catheterization from April is outlined above with plan for continued medical therapy.  3. Ongoing tobacco abuse, has not been able to quit.  Current medicines were reviewed with the patient today.   Orders Placed This Encounter  Procedures  . EKG 12-Lead    Disposition: FU with me in 4 months.   Signed, Satira Sark, MD, [facility] 10/07/2015 9:37 AM    [facility] [place], [place], [place] [address] Phone: [phone]; Fax: [phone]

## 2015-10-19 ENCOUNTER — Other Ambulatory Visit: Payer: Self-pay | Admitting: *Deleted

## 2015-10-19 DIAGNOSIS — Z0000 Encounter for general adult medical examination without abnormal findings: Secondary | ICD-10-CM | POA: Diagnosis not present

## 2015-10-19 DIAGNOSIS — E1165 Type 2 diabetes mellitus with hyperglycemia: Secondary | ICD-10-CM | POA: Diagnosis not present

## 2015-10-19 DIAGNOSIS — I10 Essential (primary) hypertension: Secondary | ICD-10-CM | POA: Diagnosis not present

## 2015-10-19 MED ORDER — ISOSORBIDE MONONITRATE ER 60 MG PO TB24: 60.0000 mg | ORAL_TABLET | Freq: Two times a day (BID) | ORAL | Status: DC

## 2015-10-19 MED ORDER — FUROSEMIDE 20 MG PO TABS: 20.0000 mg | ORAL_TABLET | Freq: Every day | ORAL | Status: DC

## 2015-11-15 ENCOUNTER — Encounter: Payer: Medicare Other | Admitting: Cardiology

## 2015-11-15 ENCOUNTER — Encounter: Payer: Self-pay | Admitting: Cardiology

## 2015-11-15 NOTE — Progress Notes (Signed)
Patient canceled.  This encounter was created in error - please disregard. 

## 2015-11-17 ENCOUNTER — Other Ambulatory Visit: Payer: Self-pay | Admitting: *Deleted

## 2015-11-17 MED ORDER — RANOLAZINE ER 500 MG PO TB12: 500.0000 mg | ORAL_TABLET | Freq: Two times a day (BID) | ORAL | Status: DC

## 2015-11-17 MED ORDER — CARVEDILOL 6.25 MG PO TABS: 6.2500 mg | ORAL_TABLET | Freq: Two times a day (BID) | ORAL | Status: DC

## 2015-11-19 DIAGNOSIS — Z7902 Long term (current) use of antithrombotics/antiplatelets: Secondary | ICD-10-CM | POA: Diagnosis not present

## 2015-11-19 DIAGNOSIS — E7800 Pure hypercholesterolemia, unspecified: Secondary | ICD-10-CM | POA: Diagnosis not present

## 2015-11-19 DIAGNOSIS — E119 Type 2 diabetes mellitus without complications: Secondary | ICD-10-CM | POA: Diagnosis not present

## 2015-11-19 DIAGNOSIS — Z7982 Long term (current) use of aspirin: Secondary | ICD-10-CM | POA: Diagnosis not present

## 2015-11-19 DIAGNOSIS — Z79899 Other long term (current) drug therapy: Secondary | ICD-10-CM | POA: Diagnosis not present

## 2015-11-19 DIAGNOSIS — Z79891 Long term (current) use of opiate analgesic: Secondary | ICD-10-CM | POA: Diagnosis not present

## 2015-11-19 DIAGNOSIS — M79672 Pain in left foot: Secondary | ICD-10-CM | POA: Diagnosis not present

## 2015-11-19 DIAGNOSIS — I252 Old myocardial infarction: Secondary | ICD-10-CM | POA: Diagnosis not present

## 2015-11-19 DIAGNOSIS — I10 Essential (primary) hypertension: Secondary | ICD-10-CM | POA: Diagnosis not present

## 2015-11-19 DIAGNOSIS — M109 Gout, unspecified: Secondary | ICD-10-CM | POA: Diagnosis not present

## 2015-11-19 DIAGNOSIS — M25572 Pain in left ankle and joints of left foot: Secondary | ICD-10-CM | POA: Diagnosis not present

## 2015-11-19 DIAGNOSIS — J439 Emphysema, unspecified: Secondary | ICD-10-CM | POA: Diagnosis not present

## 2015-11-20 DIAGNOSIS — Z95818 Presence of other cardiac implants and grafts: Secondary | ICD-10-CM | POA: Diagnosis not present

## 2015-11-20 DIAGNOSIS — Z79899 Other long term (current) drug therapy: Secondary | ICD-10-CM | POA: Diagnosis not present

## 2015-11-20 DIAGNOSIS — Z951 Presence of aortocoronary bypass graft: Secondary | ICD-10-CM | POA: Diagnosis not present

## 2015-11-20 DIAGNOSIS — Z7902 Long term (current) use of antithrombotics/antiplatelets: Secondary | ICD-10-CM | POA: Diagnosis not present

## 2015-11-20 DIAGNOSIS — R079 Chest pain, unspecified: Secondary | ICD-10-CM | POA: Diagnosis not present

## 2015-11-20 DIAGNOSIS — M109 Gout, unspecified: Secondary | ICD-10-CM | POA: Diagnosis not present

## 2015-11-20 DIAGNOSIS — I200 Unstable angina: Secondary | ICD-10-CM | POA: Diagnosis not present

## 2015-11-20 DIAGNOSIS — E7800 Pure hypercholesterolemia, unspecified: Secondary | ICD-10-CM | POA: Diagnosis not present

## 2015-11-20 DIAGNOSIS — Z7982 Long term (current) use of aspirin: Secondary | ICD-10-CM | POA: Diagnosis not present

## 2015-11-20 DIAGNOSIS — I252 Old myocardial infarction: Secondary | ICD-10-CM | POA: Diagnosis not present

## 2015-11-20 DIAGNOSIS — R0602 Shortness of breath: Secondary | ICD-10-CM | POA: Diagnosis not present

## 2015-11-20 DIAGNOSIS — E119 Type 2 diabetes mellitus without complications: Secondary | ICD-10-CM | POA: Diagnosis not present

## 2015-11-20 DIAGNOSIS — J449 Chronic obstructive pulmonary disease, unspecified: Secondary | ICD-10-CM | POA: Diagnosis not present

## 2015-11-21 ENCOUNTER — Observation Stay (HOSPITAL_COMMUNITY)
Admission: AD | Admit: 2015-11-21 | Discharge: 2015-11-23 | Disposition: A | Discharge status: Home / Self Care | Payer: Medicare Other | Source: Other Acute Inpatient Hospital | Attending: Cardiology | Admitting: Cardiology

## 2015-11-21 DIAGNOSIS — Z7902 Long term (current) use of antithrombotics/antiplatelets: Secondary | ICD-10-CM | POA: Diagnosis not present

## 2015-11-21 DIAGNOSIS — I25710 Atherosclerosis of autologous vein coronary artery bypass graft(s) with unstable angina pectoris: Secondary | ICD-10-CM | POA: Diagnosis not present

## 2015-11-21 DIAGNOSIS — I952 Hypotension due to drugs: Secondary | ICD-10-CM | POA: Diagnosis not present

## 2015-11-21 DIAGNOSIS — Z7982 Long term (current) use of aspirin: Secondary | ICD-10-CM | POA: Insufficient documentation

## 2015-11-21 DIAGNOSIS — I25700 Atherosclerosis of coronary artery bypass graft(s), unspecified, with unstable angina pectoris: Secondary | ICD-10-CM | POA: Diagnosis present

## 2015-11-21 DIAGNOSIS — G8929 Other chronic pain: Secondary | ICD-10-CM | POA: Diagnosis not present

## 2015-11-21 DIAGNOSIS — E785 Hyperlipidemia, unspecified: Secondary | ICD-10-CM | POA: Diagnosis not present

## 2015-11-21 DIAGNOSIS — M109 Gout, unspecified: Secondary | ICD-10-CM | POA: Diagnosis not present

## 2015-11-21 DIAGNOSIS — Z955 Presence of coronary angioplasty implant and graft: Secondary | ICD-10-CM | POA: Diagnosis not present

## 2015-11-21 DIAGNOSIS — F319 Bipolar disorder, unspecified: Secondary | ICD-10-CM | POA: Insufficient documentation

## 2015-11-21 DIAGNOSIS — F17210 Nicotine dependence, cigarettes, uncomplicated: Secondary | ICD-10-CM | POA: Diagnosis not present

## 2015-11-21 DIAGNOSIS — M549 Dorsalgia, unspecified: Secondary | ICD-10-CM | POA: Diagnosis not present

## 2015-11-21 DIAGNOSIS — I2582 Chronic total occlusion of coronary artery: Secondary | ICD-10-CM | POA: Diagnosis not present

## 2015-11-21 DIAGNOSIS — I252 Old myocardial infarction: Secondary | ICD-10-CM | POA: Diagnosis not present

## 2015-11-21 DIAGNOSIS — Z951 Presence of aortocoronary bypass graft: Secondary | ICD-10-CM | POA: Insufficient documentation

## 2015-11-21 DIAGNOSIS — Z9861 Coronary angioplasty status: Secondary | ICD-10-CM

## 2015-11-21 DIAGNOSIS — R079 Chest pain, unspecified: Secondary | ICD-10-CM | POA: Diagnosis not present

## 2015-11-21 DIAGNOSIS — R001 Bradycardia, unspecified: Secondary | ICD-10-CM | POA: Diagnosis present

## 2015-11-21 DIAGNOSIS — I255 Ischemic cardiomyopathy: Secondary | ICD-10-CM | POA: Diagnosis present

## 2015-11-21 DIAGNOSIS — I25110 Atherosclerotic heart disease of native coronary artery with unstable angina pectoris: Principal | ICD-10-CM | POA: Insufficient documentation

## 2015-11-21 DIAGNOSIS — F17200 Nicotine dependence, unspecified, uncomplicated: Secondary | ICD-10-CM | POA: Diagnosis present

## 2015-11-21 DIAGNOSIS — K219 Gastro-esophageal reflux disease without esophagitis: Secondary | ICD-10-CM | POA: Insufficient documentation

## 2015-11-21 DIAGNOSIS — J45909 Unspecified asthma, uncomplicated: Secondary | ICD-10-CM | POA: Insufficient documentation

## 2015-11-21 DIAGNOSIS — I2510 Atherosclerotic heart disease of native coronary artery without angina pectoris: Secondary | ICD-10-CM

## 2015-11-21 DIAGNOSIS — E119 Type 2 diabetes mellitus without complications: Secondary | ICD-10-CM | POA: Diagnosis not present

## 2015-11-21 DIAGNOSIS — I200 Unstable angina: Secondary | ICD-10-CM | POA: Diagnosis present

## 2015-11-21 DIAGNOSIS — I10 Essential (primary) hypertension: Secondary | ICD-10-CM | POA: Diagnosis not present

## 2015-11-21 DIAGNOSIS — E782 Mixed hyperlipidemia: Secondary | ICD-10-CM | POA: Diagnosis present

## 2015-11-21 DIAGNOSIS — Z7984 Long term (current) use of oral hypoglycemic drugs: Secondary | ICD-10-CM | POA: Diagnosis not present

## 2015-11-21 DIAGNOSIS — I209 Angina pectoris, unspecified: Secondary | ICD-10-CM | POA: Diagnosis present

## 2015-11-21 LAB — CBC WITH DIFFERENTIAL/PLATELET
BASOS PCT: 0 %
Basophils Absolute: 0 10*3/uL (ref 0.0–0.1)
Eosinophils Absolute: 0 10*3/uL (ref 0.0–0.7)
Eosinophils Relative: 0 %
HEMATOCRIT: 36.3 % — AB (ref 39.0–52.0)
HEMOGLOBIN: 12 g/dL — AB (ref 13.0–17.0)
LYMPHS PCT: 15 %
Lymphs Abs: 1.4 10*3/uL (ref 0.7–4.0)
MCH: 33.5 pg (ref 26.0–34.0)
MCHC: 33.1 g/dL (ref 30.0–36.0)
MCV: 101.4 fL — AB (ref 78.0–100.0)
MONO ABS: 0.9 10*3/uL (ref 0.1–1.0)
MONOS PCT: 9 %
NEUTROS ABS: 7 10*3/uL (ref 1.7–7.7)
NEUTROS PCT: 76 %
Platelets: 194 10*3/uL (ref 150–400)
RBC: 3.58 MIL/uL — ABNORMAL LOW (ref 4.22–5.81)
RDW: 13.4 % (ref 11.5–15.5)
WBC: 9.3 10*3/uL (ref 4.0–10.5)

## 2015-11-21 LAB — MRSA PCR SCREENING: MRSA by PCR: NEGATIVE

## 2015-11-21 LAB — BASIC METABOLIC PANEL
Anion gap: 11 (ref 5–15)
BUN: 9 mg/dL (ref 6–20)
CHLORIDE: 104 mmol/L (ref 101–111)
CO2: 27 mmol/L (ref 22–32)
CREATININE: 0.86 mg/dL (ref 0.61–1.24)
Calcium: 8.9 mg/dL (ref 8.9–10.3)
GFR calc Af Amer: >60 mL/min (ref >60)
GFR calc non Af Amer: >60 mL/min (ref >60)
GLUCOSE: 177 mg/dL — AB (ref 65–99)
POTASSIUM: 3.9 mmol/L (ref 3.5–5.1)
Sodium: 142 mmol/L (ref 135–145)

## 2015-11-21 LAB — HEPARIN LEVEL (UNFRACTIONATED)
HEPARIN UNFRACTIONATED: 0.66 [IU]/mL (ref 0.30–0.70)
Heparin Unfractionated: 0.11 IU/mL — ABNORMAL LOW (ref 0.30–0.70)

## 2015-11-21 LAB — PROTIME-INR
INR: 1.05 (ref 0.00–1.49)
INR: 1 (ref 0.00–1.49)
Prothrombin Time: 13.9 seconds (ref 11.6–15.2)
Prothrombin Time: 13.4 seconds (ref 11.6–15.2)

## 2015-11-21 LAB — CBC
HEMATOCRIT: 37 % — AB (ref 39.0–52.0)
HEMOGLOBIN: 11.9 g/dL — AB (ref 13.0–17.0)
MCH: 33.1 pg (ref 26.0–34.0)
MCHC: 32.2 g/dL (ref 30.0–36.0)
MCV: 102.8 fL — AB (ref 78.0–100.0)
Platelets: 184 10*3/uL (ref 150–400)
RBC: 3.6 MIL/uL — AB (ref 4.22–5.81)
RDW: 13.4 % (ref 11.5–15.5)
WBC: 8.6 10*3/uL (ref 4.0–10.5)

## 2015-11-21 LAB — TROPONIN I
Troponin I: <0.03 ng/mL (ref <0.031)
Troponin I: <0.03 ng/mL (ref <0.031)

## 2015-11-21 LAB — GLUCOSE, CAPILLARY
GLUCOSE-CAPILLARY: 137 mg/dL — AB (ref 65–99)
GLUCOSE-CAPILLARY: 118 mg/dL — AB (ref 65–99)
Glucose-Capillary: 111 mg/dL — ABNORMAL HIGH (ref 65–99)
Glucose-Capillary: 111 mg/dL — ABNORMAL HIGH (ref 65–99)

## 2015-11-21 LAB — COMPREHENSIVE METABOLIC PANEL
ALBUMIN: 3.5 g/dL (ref 3.5–5.0)
ALK PHOS: 36 U/L — AB (ref 38–126)
ALT: 12 U/L — AB (ref 17–63)
AST: 15 U/L (ref 15–41)
Anion gap: 15 (ref 5–15)
BILIRUBIN TOTAL: 0.5 mg/dL (ref 0.3–1.2)
BUN: 11 mg/dL (ref 6–20)
CALCIUM: 8.8 mg/dL — AB (ref 8.9–10.3)
CO2: 25 mmol/L (ref 22–32)
CREATININE: 0.74 mg/dL (ref 0.61–1.24)
Chloride: 101 mmol/L (ref 101–111)
GFR calc Af Amer: >60 mL/min (ref >60)
GFR calc non Af Amer: >60 mL/min (ref >60)
GLUCOSE: 136 mg/dL — AB (ref 65–99)
Potassium: 4.4 mmol/L (ref 3.5–5.1)
SODIUM: 141 mmol/L (ref 135–145)
TOTAL PROTEIN: 6.2 g/dL — AB (ref 6.5–8.1)

## 2015-11-21 LAB — APTT: aPTT: 35 seconds (ref 24–37)

## 2015-11-21 LAB — MAGNESIUM: Magnesium: 1.4 mg/dL — ABNORMAL LOW (ref 1.7–2.4)

## 2015-11-21 MED ORDER — HEPARIN (PORCINE) IN NACL 100-0.45 UNIT/ML-% IJ SOLN
1450.0000 [IU]/h | INTRAMUSCULAR | Status: DC
  Administered 2015-11-21: 1000 [IU]/h via INTRAVENOUS
  Administered 2015-11-21: 1450 [IU]/h via INTRAVENOUS
  Filled 2015-11-21: qty 250

## 2015-11-21 MED ORDER — SODIUM CHLORIDE 0.9% FLUSH
3.0000 mL | Freq: Two times a day (BID) | INTRAVENOUS | Status: DC
  Administered 2015-11-21: 3 mL via INTRAVENOUS

## 2015-11-21 MED ORDER — ASPIRIN EC 81 MG PO TBEC
81.0000 mg | DELAYED_RELEASE_TABLET | Freq: Every day | ORAL | Status: DC
  Administered 2015-11-21 – 2015-11-23 (×3): 81 mg via ORAL
  Filled 2015-11-21 (×3): qty 1

## 2015-11-21 MED ORDER — RANOLAZINE ER 500 MG PO TB12
500.0000 mg | ORAL_TABLET | Freq: Two times a day (BID) | ORAL | Status: DC
  Administered 2015-11-21 – 2015-11-22 (×3): 500 mg via ORAL
  Filled 2015-11-21 (×3): qty 1

## 2015-11-21 MED ORDER — METHOCARBAMOL 500 MG PO TABS: 750.0000 mg | ORAL_TABLET | Freq: Three times a day (TID) | ORAL | Status: DC | PRN

## 2015-11-21 MED ORDER — FUROSEMIDE 20 MG PO TABS
20.0000 mg | ORAL_TABLET | Freq: Every day | ORAL | Status: DC
  Administered 2015-11-21: 20 mg via ORAL
  Filled 2015-11-21: qty 1

## 2015-11-21 MED ORDER — ENOXAPARIN SODIUM 40 MG/0.4ML ~~LOC~~ SOLN: 40.0000 mg | SUBCUTANEOUS | Status: DC

## 2015-11-21 MED ORDER — HYDROCODONE-ACETAMINOPHEN 7.5-325 MG PO TABS
1.0000 | ORAL_TABLET | Freq: Four times a day (QID) | ORAL | Status: DC | PRN
  Administered 2015-11-21 – 2015-11-22 (×2): 1 via ORAL
  Filled 2015-11-21 (×2): qty 1

## 2015-11-21 MED ORDER — ONDANSETRON HCL 4 MG/2ML IJ SOLN: 4.0000 mg | Freq: Four times a day (QID) | INTRAMUSCULAR | Status: DC | PRN

## 2015-11-21 MED ORDER — SODIUM CHLORIDE 0.9 % WEIGHT BASED INFUSION: 1.0000 mL/kg/h | INTRAVENOUS | Status: DC

## 2015-11-21 MED ORDER — INSULIN ASPART 100 UNIT/ML ~~LOC~~ SOLN: 0.0000 [IU] | Freq: Every day | SUBCUTANEOUS | Status: DC

## 2015-11-21 MED ORDER — NITROGLYCERIN IN D5W 200-5 MCG/ML-% IV SOLN
2.0000 ug/min | INTRAVENOUS | Status: DC
  Administered 2015-11-21: 30 ug/min via INTRAVENOUS

## 2015-11-21 MED ORDER — DIAZEPAM 5 MG PO TABS
10.0000 mg | ORAL_TABLET | Freq: Two times a day (BID) | ORAL | Status: DC
  Administered 2015-11-21 – 2015-11-22 (×3): 10 mg via ORAL
  Filled 2015-11-21 (×4): qty 2

## 2015-11-21 MED ORDER — SODIUM CHLORIDE 0.9 % IV SOLN: 250.0000 mL | INTRAVENOUS | Status: DC | PRN

## 2015-11-21 MED ORDER — OMEGA-3-ACID ETHYL ESTERS 1 G PO CAPS
1.0000 g | ORAL_CAPSULE | Freq: Two times a day (BID) | ORAL | Status: DC
  Administered 2015-11-21 – 2015-11-23 (×5): 1 g via ORAL
  Filled 2015-11-21 (×5): qty 1

## 2015-11-21 MED ORDER — CARVEDILOL 6.25 MG PO TABS
6.2500 mg | ORAL_TABLET | Freq: Two times a day (BID) | ORAL | Status: DC
  Administered 2015-11-21 – 2015-11-22 (×2): 6.25 mg via ORAL
  Filled 2015-11-21 (×2): qty 1

## 2015-11-21 MED ORDER — SODIUM CHLORIDE 0.9% FLUSH: 3.0000 mL | INTRAVENOUS | Status: DC | PRN

## 2015-11-21 MED ORDER — INSULIN ASPART 100 UNIT/ML ~~LOC~~ SOLN
0.0000 [IU] | Freq: Three times a day (TID) | SUBCUTANEOUS | Status: DC
  Administered 2015-11-21 – 2015-11-22 (×2): 2 [IU] via SUBCUTANEOUS

## 2015-11-21 MED ORDER — GABAPENTIN 300 MG PO CAPS
600.0000 mg | ORAL_CAPSULE | Freq: Three times a day (TID) | ORAL | Status: DC
  Administered 2015-11-21 – 2015-11-23 (×7): 600 mg via ORAL
  Filled 2015-11-21 (×10): qty 2

## 2015-11-21 MED ORDER — SODIUM CHLORIDE 0.9 % WEIGHT BASED INFUSION
3.0000 mL/kg/h | INTRAVENOUS | Status: DC
  Administered 2015-11-22: 3 mL/kg/h via INTRAVENOUS

## 2015-11-21 MED ORDER — NITROGLYCERIN 0.4 MG SL SUBL: 0.4000 mg | SUBLINGUAL_TABLET | SUBLINGUAL | Status: DC | PRN

## 2015-11-21 MED ORDER — CARVEDILOL 6.25 MG PO TABS
6.2500 mg | ORAL_TABLET | Freq: Two times a day (BID) | ORAL | Status: DC
  Filled 2015-11-21: qty 1

## 2015-11-21 MED ORDER — PANTOPRAZOLE SODIUM 40 MG PO TBEC
40.0000 mg | DELAYED_RELEASE_TABLET | Freq: Every day | ORAL | Status: DC
Start: 2015-11-21 — End: 2015-11-23
  Administered 2015-11-21 – 2015-11-23 (×3): 40 mg via ORAL
  Filled 2015-11-21 (×3): qty 1

## 2015-11-21 MED ORDER — CLOPIDOGREL BISULFATE 75 MG PO TABS
75.0000 mg | ORAL_TABLET | Freq: Every day | ORAL | Status: DC
  Administered 2015-11-21 – 2015-11-23 (×3): 75 mg via ORAL
  Filled 2015-11-21 (×3): qty 1

## 2015-11-21 MED ORDER — PRAVASTATIN SODIUM 40 MG PO TABS
80.0000 mg | ORAL_TABLET | Freq: Every evening | ORAL | Status: DC
  Administered 2015-11-21 – 2015-11-22 (×2): 80 mg via ORAL
  Filled 2015-11-21 (×2): qty 2

## 2015-11-21 MED ORDER — ACETAMINOPHEN 325 MG PO TABS: 650.0000 mg | ORAL_TABLET | ORAL | Status: DC | PRN

## 2015-11-21 MED ORDER — LISINOPRIL 5 MG PO TABS
2.5000 mg | ORAL_TABLET | Freq: Two times a day (BID) | ORAL | Status: DC
  Administered 2015-11-21 – 2015-11-22 (×3): 2.5 mg via ORAL
  Filled 2015-11-21 (×7): qty 1

## 2015-11-21 MED ORDER — HYDROMORPHONE HCL 1 MG/ML IJ SOLN
0.5000 mg | Freq: Once | INTRAMUSCULAR | Status: AC
  Administered 2015-11-21: 0.5 mg via INTRAVENOUS
  Filled 2015-11-21: qty 1

## 2015-11-21 MED ORDER — HEPARIN BOLUS VIA INFUSION
2500.0000 [IU] | Freq: Once | INTRAVENOUS | Status: AC
  Administered 2015-11-21: 2500 [IU] via INTRAVENOUS
  Filled 2015-11-21: qty 2500

## 2015-11-21 MED ORDER — ALBUTEROL SULFATE (2.5 MG/3ML) 0.083% IN NEBU: 3.0000 mL | INHALATION_SOLUTION | Freq: Four times a day (QID) | RESPIRATORY_TRACT | Status: DC | PRN

## 2015-11-21 NOTE — Progress Notes (Signed)
ANTICOAGULATION CONSULT NOTE - Initial Consult  Pharmacy Consult for heparin Indication: chest pain/ACS  No Known Allergies  Patient Measurements: Height: 5\' 4"  (162.6 cm) Weight: 210 lb 1.6 oz (95.3 kg) IBW/kg (Calculated) : 59.2 Heparin Dosing Weight: 80kg  Vital Signs: Temp: 97.5 F (36.4 C) (02/12 0235) Temp Source: Oral (02/12 0235) BP: 104/73 mmHg (02/12 0246) Pulse Rate: 67 (02/12 0246)   Medical History: Past Medical History  Diagnosis Date  . Coronary atherosclerosis of native coronary artery     a. CABG 2003. b. NSTEMI 2010 s/p BMS to SVG-RCA c. PTCA/stent/asp thromb to SVG-RCA 01/2010, PTCA to SVG-RCA 04/2010 d. known occlusion of SVG-RCA 2013. e. NSTEMI 07/2013  s/p PTCA/DES to SVG-diagonal/intermediate/OM1.   . Mixed hyperlipidemia   . Bipolar disorder ([facility])   . GERD (gastroesophageal reflux disease)   . Gout   . Chronic back pain   . Ischemic cardiomyopathy     a. EF 35-40% by cath 07/2013.  . Sinus bradycardia   . Essential hypertension   . Asthma   . Type 2 diabetes mellitus [facility])      Assessment: 50yo male presents from [facility] for further cards w/u, to continue heparin started there.  Goal of Therapy:  Heparin level 0.3-0.7 units/ml Monitor platelets by anticoagulation protocol: Yes   Plan:  Heparin bolus given and gtt started at 1000 units/hr at OSH; will monitor heparin levels and CBC starting now.  Vernard Gambles, PharmD, BCPS  11/21/2015,4:04 AM

## 2015-11-21 NOTE — Progress Notes (Signed)
Pt continues to have intermittent chest pain, though much improved with ntg. Given exertional symptoms for several days, known CAD, and ongoing tobacco use, I am very worried about unstable angina.  I would therefore recommend left heart catheterization with possible PCI.  Discussed the cath with the patient. The patient understands that risks included but are not limited to stroke (1 in 1000), death (1 in 1000), kidney failure [usually temporary] (1 in 500), bleeding (1 in 200), allergic reaction [possibly serious] (1 in 200). The patient understands and agrees to proceed.   Hillis Range MD, [facility] 11/21/2015 12:54 PM

## 2015-11-21 NOTE — Progress Notes (Signed)
ANTICOAGULATION CONSULT NOTE - Follow-up Consult  Pharmacy Consult for heparin Indication: chest pain/ACS  No Known Allergies  Patient Measurements: Height: 5\' 4"  (162.6 cm) Weight: 210 lb 1.6 oz (95.3 kg) IBW/kg (Calculated) : 59.2 Heparin Dosing Weight: 80kg  Vital Signs: Temp: 97.3 F (36.3 C) (02/12 0443) Temp Source: Oral (02/12 0443) BP: 96/62 mmHg (02/12 0446) Pulse Rate: 70 (02/12 0446)   Assessment: 50yo male presents from [facility] for further cards w/u, to continue heparin started there. Heparin level 0.11 (subtherapeutic) on gtt at 1000 units/hr. No issues with line or bleeding reported per RN.  Goal of Therapy:  Heparin level 0.3-0.7 units/ml Monitor platelets by anticoagulation protocol: Yes   Plan:  Rebolus heparin 2500 units Increase heparin gtt to 1450 units/hr Will f/u 6 hr heparin level  Christoper Fabian, PharmD, BCPS Clinical pharmacist, pager [phone] 11/21/2015,6:15 AM

## 2015-11-21 NOTE — H&P (Signed)
Cardiology Consultation Note    Patient ID: [name] [name] MRN: [id number], DOB: 07-Mar-1966 Date of Encounter: 11/21/2015, 7:45 AM Primary Physician: [name], MD Primary Cardiologist: Dr. Domenic Polite  Chief Complaint: Chest pain Reason for Admission: Chest pain   HPI: [name] [name] is a 50 year old man with history of coronary artery disease including 5 vessel CABG in 2003 and multiple PCI's, most recently undergoing drug-eluting stent placement to the SVG to diagonal/ramus/OM system and 06/2014, who was transferred to [facility] with chest pain from [facility] ED, [place].  The patient reports that he has had intermittent chest pain for several weeks, predominantly with exertion.  He would occasionally take nitroglycerin with relief.  However, at approximately 1 PM yesterday (11/20/15), he developed 7/10 chest pain while riding in a car.  Upon reaching his home, he noticed that the pain worsened further while walking, prompting him to take 9 nitroglycerin.  He noted some transient improvement, though this was complicated by lightheadedness.  He describes the pain as a significant pressure radiating to the left arm and elbow accompanied by diaphoresis and shortness of breath.  At [facility], he received additional sublingual nitroglycerin without significant improvement.  He was then placed on a nitroglycerin infusion and given both morphine and Dilaudid with some relief.  A heparin infusion was also started.  Currently, his pain is 4/10 in intensity.  The patient reports that he has been compliant with his medications, including dual antiplatelet therapy with aspirin and clopidogrel.  His most recent cardiac catheterization was performed on 01/28/15, which showed a diffusely diseased left main coronary artery and ostial LAD occlusion.  The LAD fills via a patent LIMA graft.  Left circumflex has an 80% ostial/proximal stenosis as well as a 95% lesion in the midportion of the ramus  intermedius/OM1.  Saphenous vein graft to the system has 20% in-stent restenosis within and ostial stent.  Right coronary artery is occluded in its midsegment.  SVG to RCA is known to be occluded.  No intervention was performed at the time.  Echocardiogram in 12/2013 showed mild LVH with normal contraction (LVEF 55-60%).  Patient was seen in the emergency department at [facility] 2 days ago for pain and swelling in the left ankle.  He was diagnosed with a gout flare and received IV steroids with complete resolution of the symptom within 1 day.  Past Medical History  Diagnosis Date  . Coronary atherosclerosis of native coronary artery     a. CABG 2003. b. NSTEMI 2010 s/p BMS to SVG-RCA c. PTCA/stent/asp thromb to SVG-RCA 01/2010, PTCA to SVG-RCA 04/2010 d. known occlusion of SVG-RCA 2013. e. NSTEMI 07/2013  s/p PTCA/DES to SVG-diagonal/intermediate/OM1.   . Mixed hyperlipidemia   . Bipolar disorder ([place])   . GERD (gastroesophageal reflux disease)   . Gout   . Chronic back pain   . Ischemic cardiomyopathy     a. EF 35-40% by cath 07/2013.  . Sinus bradycardia   . Essential hypertension   . Asthma   . Type 2 diabetes mellitus [facility])      Surgical History:  Past Surgical History  Procedure Laterality Date  . Cholecystectomy    . Coronary artery bypass graft      2003, LIMA to LAD; SVG to diagonal; SVG to OM, ramus, and diagonal; SVG to RCA  . Pin placement left leg    . Metal plate to chin    . Left heart catheterization with coronary/graft angiogram N/A 07/14/2013  Procedure: LEFT HEART CATHETERIZATION WITH Beatrix Fetters;  Surgeon: Burnell Blanks, MD;  Location[facility] C[facility] LAB;  Service: Cardiovascular;  Laterality: N/A;  . Percutaneous coronary stent intervention (pci-s)  07/14/2013    Procedure: PERCUTANEOUS CORONARY STENT INTERVENTION (PCI-S);  Surgeon: Burnell Blanks, MD;  Location: [facility] CATH LAB;  Service: Cardiovascular;;  . Left heart catheterization with  coronary/graft angiogram N/A 12/29/2013    Procedure: LEFT HEART CATHETERIZATION WITH Beatrix Fetters;  Surgeon: Leonie Man, MD;  Location: [facility] CATH LAB;  Service: Cardiovascular;  Laterality: N/A;  . Left heart catheterization with coronary/graft angiogram N/A 06/19/2014    Procedure: LEFT HEART CATHETERIZATION WITH Beatrix Fetters;  Surgeon: Leonie Man, MD;  Location: [facility] CATH LAB;  Service: Cardiovascular;  Laterality: N/A;  . Cardiac catheterization  06/19/2014    Procedure: CORONARY STENT INTERVENTION;  Surgeon: Leonie Man, MD;  Location: [facility] CATH LAB;  Service: Cardiovascular;;  DES to seg SVG Diag/OM/Ramus   . Left heart catheterization with coronary angiogram N/A 01/28/2015    Procedure: LEFT HEART CATHETERIZATION WITH CORONARY ANGIOGRAM;  Surgeon: Leonie Man, MD;  Location: [facility] CATH LAB;  Service: Cardiovascular;  Laterality: N/A;     Home Meds: Prior to Admission medications   Medication Sig Start Date [name] [name] Date Taking? Authorizing Provider  acetaminophen (TYLENOL) 325 MG tablet Take 2 tablets (650 mg total) by mouth every 4 (four) hours as needed for headache or mild pain. 06/20/14   Erlene Quan, PA-C  albuterol (PROVENTIL HFA;VENTOLIN HFA) 108 (90 BASE) MCG/ACT inhaler Inhale 2 puffs into the lungs every 6 (six) hours as needed for wheezing or shortness of breath.    Historical Provider, MD  aspirin EC 81 MG tablet Take 1 tablet (81 mg total) by mouth daily. 12/05/12   Donney Dice, PA-C  Blood Pressure Monitoring KIT 1 each by Does not apply route as directed. 09/29/14   Satira Sark, MD  carvedilol (COREG) 6.25 MG tablet Take 1 tablet (6.25 mg total) by mouth 2 (two) times daily. 11/17/15   Satira Sark, MD  clopidogrel (PLAVIX) 75 MG tablet TAKE 1 TABLET BY MOUTH EVERY DAY 05/25/15   Satira Sark, MD  colchicine 0.6 MG tablet Take 0.6 mg by mouth 2 (two) times daily as needed. 01/20/15   Historical Provider, MD  diazepam (VALIUM) 10 MG  tablet Take 10 mg by mouth 2 (two) times daily.    Historical Provider, MD  furosemide (LASIX) 20 MG tablet Take 1 tablet (20 mg total) by mouth at bedtime. 10/19/15   Satira Sark, MD  gabapentin (NEURONTIN) 300 MG capsule Take 600 mg by mouth 3 (three) times daily.  08/04/14   Satira Sark, MD  HYDROcodone-acetaminophen (NORCO) 7.5-325 MG per tablet Take 1 tablet by mouth every 6 (six) hours as needed for moderate pain.    Historical Provider, MD  isosorbide mononitrate (IMDUR) 60 MG 24 hr tablet Take 1 tablet (60 mg total) by mouth 2 (two) times daily. 10/19/15   Satira Sark, MD  lisinopril (PRINIVIL,ZESTRIL) 2.5 MG tablet Take 1 tablet (2.5 mg total) by mouth 2 (two) times daily. 07/22/15   Satira Sark, MD  metFORMIN (GLUCOPHAGE) 1000 MG tablet Take 1,000 mg by mouth 2 (two) times daily with a meal.    Historical Provider, MD  methocarbamol (ROBAXIN) 750 MG tablet Take 750 mg by mouth every 8 (eight) hours.     Historical Provider, MD  nitroGLYCERIN (NITROSTAT) 0.4 MG SL tablet  Place 1 tablet (0.4 mg total) under the tongue every 5 (five) minutes x 3 doses as needed for chest pain. If no relief after 3 rd dose, proceed to the ED for an evaluation 09/21/15   Satira Sark, MD  Omega-3 Fatty Acids (FISH OIL) 1000 MG CAPS Take 1,000-2,000 mg by mouth 2 (two) times daily. 1 IN THE MORNING AND 2 AT BEDTIME    Historical Provider, MD  pantoprazole (PROTONIX) 40 MG tablet Take 40 mg by mouth daily.     Historical Provider, MD  pravastatin (PRAVACHOL) 80 MG tablet Take 1 tablet (80 mg total) by mouth every evening. 09/21/15   Satira Sark, MD  ranolazine (RANEXA) 500 MG 12 hr tablet Take 1 tablet (500 mg total) by mouth 2 (two) times daily. 11/17/15   Satira Sark, MD    Allergies: No Known Allergies  Social History   Social History  . Marital Status: Married    Spouse Name: N/A  . Number of Children: N/A  . Years of Education: N/A   Occupational History  . Not  on file.   Social History Main Topics  . Smoking status: Current Every Day Smoker -- 0.80 packs/day for 33 years    Types: Cigarettes    Start date: 07/01/1978  . Smokeless tobacco: Never Used     Comment: trying to quit, 3/4 pack per day. Started the nicotine patch on 01-10-14-   . Alcohol Use: No     Comment: drink a can of beer occasionally every year   . Drug Use: Yes     Comment: no drugs for 2 years 01/15/2015  . Sexual Activity:    Partners: Female   Other Topics Concern  . Not on file   Social History Narrative   Married.      Family History  Problem Relation Age of Onset  . Stroke      family Hx     Review of Systems: A 12 system review of systems was performed and was negative, except as noted in the history of present illness.  Labs:   Lab Results  Component Value Date   WBC 9.3 11/21/2015   HGB 12.0* 11/21/2015   HCT 36.3* 11/21/2015   MCV 101.4* 11/21/2015   PLT 194 11/21/2015     Recent Labs Lab 11/21/15 0402  NA 141  K 4.4  CL 101  CO2 25  BUN 11  CREATININE 0.74  CALCIUM 8.8*  PROT 6.2*  BILITOT 0.5  ALKPHOS 36*  ALT 12*  AST 15  GLUCOSE 136*    Recent Labs  11/21/15 0402  TROPONINI <0.03   Lab Results  Component Value Date   CHOL 113 12/28/2013   HDL 25* 12/28/2013   LDLCALC 47 12/28/2013   TRIG 204* 12/28/2013   No results found for: DDIMER  Radiology/Studies:  No results found. Wt Readings from Last 3 Encounters:  11/21/15 95.3 kg (210 lb 1.6 oz)  10/07/15 95.709 kg (211 lb)  07/13/15 93.441 kg (206 lb)    EKG: Normal sinus rhythm with diffuse T-wave flattening in inferior Q waves.  No significant change from prior tracing on 10/07/15.  Physical Exam: Blood pressure 96/62, pulse 70, temperature 97.3 F (36.3 C), temperature source Oral, resp. rate 19, height '5\' 4"'$  (1.626 m), weight 95.3 kg (210 lb 1.6 oz), SpO2 91 %. Body mass index is 36.05 kg/(m^2). General: Obese man lying in bed.  He is somnolent but easily  arousable. Head: Normocephalic, atraumatic,  sclera non-icteric, no xanthomas, nares are without discharge.  Neck: Negative for carotid bruits. JVD not elevated. Lungs: Clear bilaterally to auscultation without wheezes, rales, or rhonchi. Breathing is unlabored. Heart: RRR with S1 S2. No murmurs, rubs, or gallops appreciated. Abdomen: Soft, non-tender, non-distended with normoactive bowel sounds. No hepatomegaly. No rebound/guarding. No obvious abdominal masses. Msk:  Strength and tone appear normal for age. Extremities: No clubbing or cyanosis. No edema.  Distal pedal pulses are 2+ and equal bilaterally. Neuro: Alert and oriented X 3. No focal deficit. No facial asymmetry. Moves all extremities spontaneously. Psych:  Slow to respond with flat affect.      Assessment and Plan  [name] [name] is a 50 year old man with history of extensive coronary artery disease presenting with acute onset of chest pain at rest yesterday at 1 PM, which has continued despite aggressive medical therapy.  Chest pain: Etiologies include coronary insufficiency, particularly given the patient's history, pulmonary embolism (no significant risk factors for DVT/PE), GERD/esophageal spasm, and less likely aortic dissection.  Patient continues to have some chest pain at this time, though it has improved with IV Dilaudid.  Admit to step down.  Continue aspirin and clopidogrel, as well as heparin infusion.  Trend troponins 3, or until they have peaked.  Continue nitroglycerin infusion; titrate to relief of chest pain.  We will hold isosorbide mononitrate while patient is receiving nitroglycerin infusion.  Will defer decision for repeat myocardial perfusion study versus coronary angiography based on response of patient's pain to aforementioned interventions and results of serial troponins.  Continue PPI.  Hypertension: Blood pressure well controlled at this time.    We will continue his home medication  regimen.  Hyperlipidemia: Patient currently on pravastatin 80 mg daily.  Will not make any changes to this, though transitioning to a high potency statin considered given the patient's relatively young age and severity of coronary atherosclerosis.  Diabetes mellitus:   Hold metformin for possible IV contrast load.  Sliding-scale insulin.  CODE STATUS: Full code  Signed, Nelva Bush MD 11/21/2015, 7:45 AM

## 2015-11-21 NOTE — Progress Notes (Signed)
ANTICOAGULATION CONSULT NOTE - Follow-up Consult  Pharmacy Consult for heparin Indication: chest pain/ACS  No Known Allergies  Patient Measurements: Height: 5\' 4"  (162.6 cm) Weight: 210 lb 1.6 oz (95.3 kg) IBW/kg (Calculated) : 59.2 Heparin Dosing Weight: 80kg  Vital Signs: Temp: 97.2 F (36.2 C) (02/12 1200) Temp Source: Axillary (02/12 1200) BP: 96/68 mmHg (02/12 1200) Pulse Rate: 79 (02/12 1200)   Assessment: 49yo male transferred from [facility] for further cards w/u, to continue heparin started there. Heparin level therapeutic at 0.66 after a 2500 unit bolus and rate increase to 1450 unit/hr.  No bleeding reported.  Cards rec LHC.  Goal of Therapy:  Heparin level 0.3-0.7 units/ml Monitor platelets by anticoagulation protocol: Yes   Plan: continue heparin gtt at 1450 units/hr Daily HL and CBC  Herby Abraham, Pharm.D. [phone] 11/21/2015 2:56 PM

## 2015-11-22 ENCOUNTER — Encounter (HOSPITAL_COMMUNITY)
Admission: AD | Disposition: A | Discharge status: Home / Self Care | Payer: Self-pay | Source: Other Acute Inpatient Hospital | Attending: Cardiovascular Disease

## 2015-11-22 ENCOUNTER — Encounter (HOSPITAL_COMMUNITY): Payer: Self-pay | Admitting: Interventional Cardiology

## 2015-11-22 DIAGNOSIS — M549 Dorsalgia, unspecified: Secondary | ICD-10-CM | POA: Diagnosis not present

## 2015-11-22 DIAGNOSIS — I952 Hypotension due to drugs: Secondary | ICD-10-CM | POA: Clinically undetermined

## 2015-11-22 DIAGNOSIS — I25110 Atherosclerotic heart disease of native coronary artery with unstable angina pectoris: Principal | ICD-10-CM

## 2015-11-22 DIAGNOSIS — F17210 Nicotine dependence, cigarettes, uncomplicated: Secondary | ICD-10-CM | POA: Diagnosis not present

## 2015-11-22 DIAGNOSIS — I200 Unstable angina: Secondary | ICD-10-CM | POA: Diagnosis not present

## 2015-11-22 DIAGNOSIS — F319 Bipolar disorder, unspecified: Secondary | ICD-10-CM | POA: Diagnosis not present

## 2015-11-22 DIAGNOSIS — Z7902 Long term (current) use of antithrombotics/antiplatelets: Secondary | ICD-10-CM | POA: Diagnosis not present

## 2015-11-22 DIAGNOSIS — E119 Type 2 diabetes mellitus without complications: Secondary | ICD-10-CM | POA: Diagnosis not present

## 2015-11-22 DIAGNOSIS — R001 Bradycardia, unspecified: Secondary | ICD-10-CM

## 2015-11-22 DIAGNOSIS — E782 Mixed hyperlipidemia: Secondary | ICD-10-CM | POA: Diagnosis not present

## 2015-11-22 DIAGNOSIS — Z7982 Long term (current) use of aspirin: Secondary | ICD-10-CM | POA: Diagnosis not present

## 2015-11-22 DIAGNOSIS — M109 Gout, unspecified: Secondary | ICD-10-CM | POA: Diagnosis not present

## 2015-11-22 DIAGNOSIS — I2582 Chronic total occlusion of coronary artery: Secondary | ICD-10-CM | POA: Diagnosis not present

## 2015-11-22 DIAGNOSIS — I252 Old myocardial infarction: Secondary | ICD-10-CM | POA: Diagnosis not present

## 2015-11-22 DIAGNOSIS — I25710 Atherosclerosis of autologous vein coronary artery bypass graft(s) with unstable angina pectoris: Secondary | ICD-10-CM | POA: Diagnosis not present

## 2015-11-22 DIAGNOSIS — J45909 Unspecified asthma, uncomplicated: Secondary | ICD-10-CM | POA: Diagnosis not present

## 2015-11-22 DIAGNOSIS — I2510 Atherosclerotic heart disease of native coronary artery without angina pectoris: Secondary | ICD-10-CM | POA: Diagnosis not present

## 2015-11-22 DIAGNOSIS — Z7984 Long term (current) use of oral hypoglycemic drugs: Secondary | ICD-10-CM | POA: Diagnosis not present

## 2015-11-22 DIAGNOSIS — K219 Gastro-esophageal reflux disease without esophagitis: Secondary | ICD-10-CM | POA: Diagnosis not present

## 2015-11-22 DIAGNOSIS — I10 Essential (primary) hypertension: Secondary | ICD-10-CM | POA: Diagnosis not present

## 2015-11-22 DIAGNOSIS — Z951 Presence of aortocoronary bypass graft: Secondary | ICD-10-CM | POA: Diagnosis not present

## 2015-11-22 DIAGNOSIS — G8929 Other chronic pain: Secondary | ICD-10-CM | POA: Diagnosis not present

## 2015-11-22 DIAGNOSIS — Z955 Presence of coronary angioplasty implant and graft: Secondary | ICD-10-CM | POA: Diagnosis not present

## 2015-11-22 DIAGNOSIS — I25700 Atherosclerosis of coronary artery bypass graft(s), unspecified, with unstable angina pectoris: Secondary | ICD-10-CM | POA: Diagnosis not present

## 2015-11-22 DIAGNOSIS — I255 Ischemic cardiomyopathy: Secondary | ICD-10-CM | POA: Diagnosis not present

## 2015-11-22 HISTORY — PX: CARDIAC CATHETERIZATION: SHX172

## 2015-11-22 LAB — GLUCOSE, CAPILLARY
GLUCOSE-CAPILLARY: 86 mg/dL (ref 65–99)
GLUCOSE-CAPILLARY: 135 mg/dL — AB (ref 65–99)
GLUCOSE-CAPILLARY: 148 mg/dL — AB (ref 65–99)
GLUCOSE-CAPILLARY: 88 mg/dL (ref 65–99)
Glucose-Capillary: 84 mg/dL (ref 65–99)

## 2015-11-22 LAB — CBC
HEMATOCRIT: 38.2 % — AB (ref 39.0–52.0)
HEMATOCRIT: 40.6 % (ref 39.0–52.0)
HEMOGLOBIN: 12.9 g/dL — AB (ref 13.0–17.0)
Hemoglobin: 12.1 g/dL — ABNORMAL LOW (ref 13.0–17.0)
MCH: 33 pg (ref 26.0–34.0)
MCH: 33 pg (ref 26.0–34.0)
MCHC: 31.7 g/dL (ref 30.0–36.0)
MCHC: 31.8 g/dL (ref 30.0–36.0)
MCV: 104.1 fL — AB (ref 78.0–100.0)
MCV: 103.8 fL — ABNORMAL HIGH (ref 78.0–100.0)
Platelets: 196 10*3/uL (ref 150–400)
Platelets: 189 10*3/uL (ref 150–400)
RBC: 3.67 MIL/uL — ABNORMAL LOW (ref 4.22–5.81)
RBC: 3.91 MIL/uL — ABNORMAL LOW (ref 4.22–5.81)
RDW: 13.5 % (ref 11.5–15.5)
RDW: 13.4 % (ref 11.5–15.5)
WBC: 9 10*3/uL (ref 4.0–10.5)
WBC: 5.3 10*3/uL (ref 4.0–10.5)

## 2015-11-22 LAB — HEPARIN LEVEL (UNFRACTIONATED): Heparin Unfractionated: 0.41 IU/mL (ref 0.30–0.70)

## 2015-11-22 LAB — CREATININE, SERUM: CREATININE: 0.8 mg/dL (ref 0.61–1.24)

## 2015-11-22 LAB — MAGNESIUM: MAGNESIUM: 1.6 mg/dL — AB (ref 1.7–2.4)

## 2015-11-22 SURGERY — LEFT HEART CATH AND CORONARY ANGIOGRAPHY
Anesthesia: LOCAL

## 2015-11-22 MED ORDER — FENTANYL CITRATE (PF) 100 MCG/2ML IJ SOLN
INTRAMUSCULAR | Status: AC
  Filled 2015-11-22: qty 2

## 2015-11-22 MED ORDER — VERAPAMIL HCL 2.5 MG/ML IV SOLN
INTRAVENOUS | Status: AC
  Filled 2015-11-22: qty 2

## 2015-11-22 MED ORDER — CARVEDILOL 3.125 MG PO TABS
3.1250 mg | ORAL_TABLET | Freq: Two times a day (BID) | ORAL | Status: DC
  Administered 2015-11-22 – 2015-11-23 (×2): 3.125 mg via ORAL
  Filled 2015-11-22 (×3): qty 1

## 2015-11-22 MED ORDER — FUROSEMIDE 20 MG PO TABS
20.0000 mg | ORAL_TABLET | Freq: Every day | ORAL | Status: DC
  Administered 2015-11-22 – 2015-11-23 (×2): 20 mg via ORAL
  Filled 2015-11-22 (×2): qty 1

## 2015-11-22 MED ORDER — IOHEXOL 350 MG/ML SOLN
INTRAVENOUS | Status: DC | PRN
  Administered 2015-11-22: 95 mL via INTRA_ARTERIAL

## 2015-11-22 MED ORDER — SODIUM CHLORIDE 0.9% FLUSH: 3.0000 mL | INTRAVENOUS | Status: DC | PRN

## 2015-11-22 MED ORDER — ISOSORBIDE MONONITRATE ER 60 MG PO TB24
60.0000 mg | ORAL_TABLET | Freq: Two times a day (BID) | ORAL | Status: DC
  Administered 2015-11-22 – 2015-11-23 (×2): 60 mg via ORAL
  Filled 2015-11-22 (×2): qty 1

## 2015-11-22 MED ORDER — LIDOCAINE HCL (PF) 1 % IJ SOLN
INTRAMUSCULAR | Status: DC | PRN
  Administered 2015-11-22: 12:00:00

## 2015-11-22 MED ORDER — RANOLAZINE ER 500 MG PO TB12
1000.0000 mg | ORAL_TABLET | Freq: Two times a day (BID) | ORAL | Status: DC
  Administered 2015-11-22 – 2015-11-23 (×2): 1000 mg via ORAL
  Filled 2015-11-22 (×2): qty 2

## 2015-11-22 MED ORDER — ENOXAPARIN SODIUM 40 MG/0.4ML ~~LOC~~ SOLN
40.0000 mg | SUBCUTANEOUS | Status: DC
  Administered 2015-11-23: 09:00:00 40 mg via SUBCUTANEOUS
  Filled 2015-11-22 (×2): qty 0.4

## 2015-11-22 MED ORDER — LISINOPRIL 5 MG PO TABS
2.5000 mg | ORAL_TABLET | Freq: Every day | ORAL | Status: DC
  Administered 2015-11-23: 2.5 mg via ORAL
  Filled 2015-11-22: qty 1

## 2015-11-22 MED ORDER — ACETAMINOPHEN 325 MG PO TABS: 650.0000 mg | ORAL_TABLET | ORAL | Status: DC | PRN

## 2015-11-22 MED ORDER — SODIUM CHLORIDE 0.9 % IV SOLN: 250.0000 mL | INTRAVENOUS | Status: DC | PRN

## 2015-11-22 MED ORDER — LIDOCAINE HCL (PF) 1 % IJ SOLN
INTRAMUSCULAR | Status: AC
  Filled 2015-11-22: qty 30

## 2015-11-22 MED ORDER — MIDAZOLAM HCL 2 MG/2ML IJ SOLN
INTRAMUSCULAR | Status: AC
  Filled 2015-11-22: qty 2

## 2015-11-22 MED ORDER — SODIUM CHLORIDE 0.9% FLUSH
3.0000 mL | Freq: Two times a day (BID) | INTRAVENOUS | Status: DC
  Administered 2015-11-22: 16:00:00 3 mL via INTRAVENOUS

## 2015-11-22 MED ORDER — FENTANYL CITRATE (PF) 100 MCG/2ML IJ SOLN
INTRAMUSCULAR | Status: DC | PRN
  Administered 2015-11-22 (×3): 50 ug via INTRAVENOUS

## 2015-11-22 MED ORDER — HEPARIN (PORCINE) IN NACL 2-0.9 UNIT/ML-% IJ SOLN
INTRAMUSCULAR | Status: AC
  Filled 2015-11-22: qty 1500

## 2015-11-22 MED ORDER — OXYCODONE-ACETAMINOPHEN 5-325 MG PO TABS: 1.0000 | ORAL_TABLET | ORAL | Status: DC | PRN

## 2015-11-22 MED ORDER — SODIUM CHLORIDE 0.9 % WEIGHT BASED INFUSION
1.0000 mL/kg/h | INTRAVENOUS | Status: AC
  Administered 2015-11-22: 13:00:00 1 mL/kg/h via INTRAVENOUS

## 2015-11-22 MED ORDER — MIDAZOLAM HCL 2 MG/2ML IJ SOLN
INTRAMUSCULAR | Status: DC | PRN
  Administered 2015-11-22 (×2): 1 mg via INTRAVENOUS

## 2015-11-22 MED FILL — Heparin Sodium (Porcine) 100 Unt/ML in Sodium Chloride 0.45%: INTRAMUSCULAR | Qty: 250 | Status: AC

## 2015-11-22 MED FILL — Nitroglycerin IV Soln 200 MCG/ML in D5W: INTRAVENOUS | Qty: 250 | Status: AC

## 2015-11-22 SURGICAL SUPPLY — 10 items
CATH INFINITI 5 FR IM (CATHETERS) ×1 IMPLANT
CATH INFINITI 5FR JL4 (CATHETERS) ×1 IMPLANT
CATH SITESEER 5F MULTI A 2 (CATHETERS) ×1 IMPLANT
DEVICE WIRE ANGIOSEAL 6FR (Vascular Products) ×1 IMPLANT
KIT HEART LEFT (KITS) ×2 IMPLANT
PACK CARDIAC CATHETERIZATION (CUSTOM PROCEDURE TRAY) ×2 IMPLANT
SHEATH PINNACLE 5F 10CM (SHEATH) ×1 IMPLANT
TRANSDUCER W/STOPCOCK (MISCELLANEOUS) ×2 IMPLANT
TUBING CIL FLEX 10 FLL-RA (TUBING) ×2 IMPLANT
WIRE EMERALD 3MM-J .035X150CM (WIRE) ×1 IMPLANT

## 2015-11-22 NOTE — Interval H&P Note (Signed)
Cath Lab Visit (complete for each Cath Lab visit)  Clinical Evaluation Leading to the Procedure:   ACS: Yes.    Non-ACS:    Anginal Classification: CCS III  Anti-ischemic medical therapy: Maximal Therapy (2 or more classes of medications)  Non-Invasive Test Results: No non-invasive testing performed  Prior CABG: Previous CABG      History and Physical Interval Note:  11/22/2015 11:07 AM  [name] [name]  has presented today for surgery, with the diagnosis of unstable angina  The various methods of treatment have been discussed with the patient and family. After consideration of risks, benefits and other options for treatment, the patient has consented to  Procedure(s): Left Heart Cath and Coronary Angiography (N/A) as a surgical intervention .  The patient's history has been reviewed, patient examined, no change in status, stable for surgery.  I have reviewed the patient's chart and labs.  Questions were answered to the patient's satisfaction.     [name] [name]

## 2015-11-22 NOTE — Progress Notes (Signed)
Discussed plan with Dr. Herbie [place]. Was not planning to dc today. Need to see stable BP. Multiple med adjustments today per our discussion: - carvedilol dose already cut in half - Lasix changed to 1600 today then starting tomorrow AM - changing lisinopril to once daily - increasing Ranexa to 1000mg  BID - resuming Imdur at home dose  Probable dc in AM.  Ronie Spies PA-C

## 2015-11-22 NOTE — Progress Notes (Signed)
Patient Name: [name] [name] Date of Encounter: 11/22/2015  Hospital Problem List     Principal Problem:   Accelerating angina [facility]) Active Problems:   Essential hypertension, benign   Cardiomyopathy, ischemic   Coronary atherosclerosis of native coronary artery   CAD S/P percutaneous coronary angioplasty - PCI of SVG-OM   Coronary artery disease involving coronary bypass graft of native heart with unstable angina pectoris (HCC)   Mixed hyperlipidemia   Sinus bradycardia   Hypotension due to drugs   Chest pain    Subjective   No further CP ; No PND or orthopnea + loose stool last PM - unable to make it to bathroom in time NTG stopped o/n due to Hypotension.  Inpatient Medications    . aspirin EC  81 mg Oral Daily  . carvedilol  3.125 mg Oral BID WC  . clopidogrel  75 mg Oral Daily  . diazepam  10 mg Oral BID  . furosemide  20 mg Oral QHS  . gabapentin  600 mg Oral TID  . insulin aspart  0-15 Units Subcutaneous TID WC  . insulin aspart  0-5 Units Subcutaneous QHS  . lisinopril  2.5 mg Oral BID  . omega-3 acid ethyl esters  1 g Oral BID  . pantoprazole  40 mg Oral Daily  . pravastatin  80 mg Oral QPM  . ranolazine  500 mg Oral BID  . sodium chloride flush  3 mL Intravenous Q12H    Vital Signs    Filed Vitals:   11/22/15 0400 11/22/15 0430 11/22/15 0500 11/22/15 0530  BP: 78/50 88/51 95/57  86/55  Pulse: 51 55 56 51  Temp:      TempSrc:      Resp: 21     Height:      Weight:      SpO2: 97% 96% 94% 100%    Intake/Output Summary (Last 24 hours) at 11/22/15 0939 Last data filed at 11/22/15 0500  Gross per 24 hour  Intake    429 ml  Output   1100 ml  Net   -671 ml   Filed Weights   11/21/15 0235 11/21/15 2329  Weight: 210 lb 1.6 oz (95.3 kg) 208 lb 5.4 oz (94.5 kg)    Physical Exam    General: Pleasant, NAD. Neuro: Alert and oriented X 3. Moves all extremities spontaneously. Psych: Normal affect. HEENT:  Normal  Neck: Supple without bruits or  JVD. Lungs:  Resp regular and unlabored, CTA. Heart: RRR no s3, s4, or murmurs. Abdomen: Soft, non-tender, non-distended, BS + x 4.  Extremities: No clubbing, cyanosis or edema. DP/PT/Radials 2+ and equal bilaterally.  Labs    CBC  Recent Labs  11/21/15 0402 11/21/15 1941 11/22/15 0319  WBC 9.3 8.6 9.0  NEUTROABS 7.0  --   --   HGB 12.0* 11.9* 12.1*  HCT 36.3* 37.0* 38.2*  MCV 101.4* 102.8* 104.1*  PLT 194 184 196   Basic Metabolic Panel  Recent Labs  11/21/15 0402 11/21/15 1941  NA 141 142  K 4.4 3.9  CL 101 104  CO2 25 27  GLUCOSE 136* 177*  BUN 11 9  CREATININE 0.74 0.86  CALCIUM 8.8* 8.9  MG 1.4*  --    Liver Function Tests  Recent Labs  11/21/15 0402  AST 15  ALT 12*  ALKPHOS 36*  BILITOT 0.5  PROT 6.2*  ALBUMIN 3.5   No results for input(s): LIPASE, AMYLASE in the last 72 hours. Cardiac Enzymes  Recent  Labs  11/21/15 0402 11/21/15 0910 11/21/15 1548  TROPONINI <0.03 <0.03 <0.03   BNP Invalid input(s): POCBNP D-Dimer No results for input(s): DDIMER in the last 72 hours. Hemoglobin A1C No results for input(s): HGBA1C in the last 72 hours. Fasting Lipid Panel No results for input(s): CHOL, HDL, LDLCALC, TRIG, CHOLHDL, LDLDIRECT in the last 72 hours. Thyroid Function Tests No results for input(s): TSH, T4TOTAL, T3FREE, THYROIDAB in the last 72 hours.  Invalid input(s): FREET3  Telemetry    Sinus Brady - 45  ECG    S Brady 48, non Specific ST-T abnormality.  Radiology    No results found.  Assessment & Plan    Principal Problem:   Accelerating angina (HCC) w/ h/o of Coronary atherosclerosis of native coronary artery /   CAD S/P percutaneous coronary angioplasty - PCI of SVG-OM   Coronary artery disease involving coronary bypass graft of native heart with unstable angina pectoris (HCC)-   plan per admitting MD & weekend rounder is for LHC/Native & Graft Angio with Possible PCI today.  On IV Heparin; On ASA & Plavix.  IV  NTG on hold due to Hypotension. Reducing BB dose  On Ranexa  Active Problems:   Essential hypertension, benign - Hypotensive this AM - meds on Hold including NTG, ACE-I & BB.   Cardiomyopathy, ischemic - no sign of volume overload on home lasix PO (will Rx for CHF based upon LVEDP)     Sinus bradycardia - reduce dose of BB (both for bradycardia & hypotension).   Chest pain - concerning for Accelerating/Unstable Angina- Cath today.  HLD - on pravastatin 80 mg per primary Cardiologist (conitnue for now).   Signed, Kiaya Haliburton W, M.D., M.S. Interventional Cardiologist   Pager # [phone] Phone # [phone] [address] [place],  [address]   

## 2015-11-22 NOTE — H&P (View-Only) (Signed)
Patient Name: [name] [name] Date of Encounter: 11/22/2015  Hospital Problem List     Principal Problem:   Accelerating angina [facility]) Active Problems:   Essential hypertension, benign   Cardiomyopathy, ischemic   Coronary atherosclerosis of native coronary artery   CAD S/P percutaneous coronary angioplasty - PCI of SVG-OM   Coronary artery disease involving coronary bypass graft of native heart with unstable angina pectoris (HCC)   Mixed hyperlipidemia   Sinus bradycardia   Hypotension due to drugs   Chest pain    Subjective   No further CP ; No PND or orthopnea + loose stool last PM - unable to make it to bathroom in time NTG stopped o/n due to Hypotension.  Inpatient Medications    . aspirin EC  81 mg Oral Daily  . carvedilol  3.125 mg Oral BID WC  . clopidogrel  75 mg Oral Daily  . diazepam  10 mg Oral BID  . furosemide  20 mg Oral QHS  . gabapentin  600 mg Oral TID  . insulin aspart  0-15 Units Subcutaneous TID WC  . insulin aspart  0-5 Units Subcutaneous QHS  . lisinopril  2.5 mg Oral BID  . omega-3 acid ethyl esters  1 g Oral BID  . pantoprazole  40 mg Oral Daily  . pravastatin  80 mg Oral QPM  . ranolazine  500 mg Oral BID  . sodium chloride flush  3 mL Intravenous Q12H    Vital Signs    Filed Vitals:   11/22/15 0400 11/22/15 0430 11/22/15 0500 11/22/15 0530  BP: 78/50 88/51 95/57  86/55  Pulse: 51 55 56 51  Temp:      TempSrc:      Resp: 21     Height:      Weight:      SpO2: 97% 96% 94% 100%    Intake/Output Summary (Last 24 hours) at 11/22/15 0939 Last data filed at 11/22/15 0500  Gross per 24 hour  Intake    429 ml  Output   1100 ml  Net   -671 ml   Filed Weights   11/21/15 0235 11/21/15 2329  Weight: 210 lb 1.6 oz (95.3 kg) 208 lb 5.4 oz (94.5 kg)    Physical Exam    General: Pleasant, NAD. Neuro: Alert and oriented X 3. Moves all extremities spontaneously. Psych: Normal affect. HEENT:  Normal  Neck: Supple without bruits or  JVD. Lungs:  Resp regular and unlabored, CTA. Heart: RRR no s3, s4, or murmurs. Abdomen: Soft, non-tender, non-distended, BS + x 4.  Extremities: No clubbing, cyanosis or edema. DP/PT/Radials 2+ and equal bilaterally.  Labs    CBC  Recent Labs  11/21/15 0402 11/21/15 1941 11/22/15 0319  WBC 9.3 8.6 9.0  NEUTROABS 7.0  --   --   HGB 12.0* 11.9* 12.1*  HCT 36.3* 37.0* 38.2*  MCV 101.4* 102.8* 104.1*  PLT 194 184 196   Basic Metabolic Panel  Recent Labs  11/21/15 0402 11/21/15 1941  NA 141 142  K 4.4 3.9  CL 101 104  CO2 25 27  GLUCOSE 136* 177*  BUN 11 9  CREATININE 0.74 0.86  CALCIUM 8.8* 8.9  MG 1.4*  --    Liver Function Tests  Recent Labs  11/21/15 0402  AST 15  ALT 12*  ALKPHOS 36*  BILITOT 0.5  PROT 6.2*  ALBUMIN 3.5   No results for input(s): LIPASE, AMYLASE in the last 72 hours. Cardiac Enzymes  Recent  Labs  11/21/15 0402 11/21/15 0910 11/21/15 1548  TROPONINI <0.03 <0.03 <0.03   BNP Invalid input(s): POCBNP D-Dimer No results for input(s): DDIMER in the last 72 hours. Hemoglobin A1C No results for input(s): HGBA1C in the last 72 hours. Fasting Lipid Panel No results for input(s): CHOL, HDL, LDLCALC, TRIG, CHOLHDL, LDLDIRECT in the last 72 hours. Thyroid Function Tests No results for input(s): TSH, T4TOTAL, T3FREE, THYROIDAB in the last 72 hours.  Invalid input(s): FREET3  Telemetry    Sinus Brady - 45  ECG    S Brady 48, non Specific ST-T abnormality.  Radiology    No results found.  Assessment & Plan    Principal Problem:   Accelerating angina [facility]) w/ h/o of Coronary atherosclerosis of native coronary artery /   CAD S/P percutaneous coronary angioplasty - PCI of SVG-OM   Coronary artery disease involving coronary bypass graft of native heart with unstable angina pectoris [facility])-   plan per admitting MD & weekend rounder is for LHC/Native & Graft Angio with Possible PCI today.  On IV Heparin; On ASA & Plavix.  IV  NTG on hold due to Hypotension. Reducing BB dose  On Ranexa  Active Problems:   Essential hypertension, benign - Hypotensive this AM - meds on Hold including NTG, ACE-I & BB.   Cardiomyopathy, ischemic - no sign of volume overload on home lasix PO (will Rx for CHF based upon LVEDP)     Sinus bradycardia - reduce dose of BB (both for bradycardia & hypotension).   Chest pain - concerning for Accelerating/Unstable Angina- Cath today.  HLD - on pravastatin 80 mg per primary Cardiologist (conitnue for now).   Signed, Marykay Lex, M.D., M.S. Interventional Cardiologist   Pager # [phone] Phone # [phone] [address] [place], [place] [address]

## 2015-11-22 NOTE — Progress Notes (Signed)
Pt BP 83/58. Nitroglycerin gtt was paused and MD notified. Will continue to monitor.

## 2015-11-23 DIAGNOSIS — Z955 Presence of coronary angioplasty implant and graft: Secondary | ICD-10-CM | POA: Diagnosis not present

## 2015-11-23 DIAGNOSIS — I25110 Atherosclerotic heart disease of native coronary artery with unstable angina pectoris: Secondary | ICD-10-CM | POA: Diagnosis not present

## 2015-11-23 DIAGNOSIS — I952 Hypotension due to drugs: Secondary | ICD-10-CM | POA: Diagnosis not present

## 2015-11-23 DIAGNOSIS — I2582 Chronic total occlusion of coronary artery: Secondary | ICD-10-CM | POA: Diagnosis not present

## 2015-11-23 DIAGNOSIS — M549 Dorsalgia, unspecified: Secondary | ICD-10-CM | POA: Diagnosis not present

## 2015-11-23 DIAGNOSIS — I25710 Atherosclerosis of autologous vein coronary artery bypass graft(s) with unstable angina pectoris: Secondary | ICD-10-CM | POA: Diagnosis not present

## 2015-11-23 DIAGNOSIS — I10 Essential (primary) hypertension: Secondary | ICD-10-CM

## 2015-11-23 DIAGNOSIS — I255 Ischemic cardiomyopathy: Secondary | ICD-10-CM

## 2015-11-23 DIAGNOSIS — Z9861 Coronary angioplasty status: Secondary | ICD-10-CM

## 2015-11-23 DIAGNOSIS — I25700 Atherosclerosis of coronary artery bypass graft(s), unspecified, with unstable angina pectoris: Secondary | ICD-10-CM | POA: Diagnosis not present

## 2015-11-23 DIAGNOSIS — G8929 Other chronic pain: Secondary | ICD-10-CM | POA: Diagnosis not present

## 2015-11-23 DIAGNOSIS — Z7982 Long term (current) use of aspirin: Secondary | ICD-10-CM | POA: Diagnosis not present

## 2015-11-23 DIAGNOSIS — K219 Gastro-esophageal reflux disease without esophagitis: Secondary | ICD-10-CM | POA: Diagnosis not present

## 2015-11-23 DIAGNOSIS — I2510 Atherosclerotic heart disease of native coronary artery without angina pectoris: Secondary | ICD-10-CM | POA: Diagnosis not present

## 2015-11-23 DIAGNOSIS — E782 Mixed hyperlipidemia: Secondary | ICD-10-CM | POA: Diagnosis not present

## 2015-11-23 DIAGNOSIS — Z7902 Long term (current) use of antithrombotics/antiplatelets: Secondary | ICD-10-CM | POA: Diagnosis not present

## 2015-11-23 DIAGNOSIS — M109 Gout, unspecified: Secondary | ICD-10-CM | POA: Diagnosis not present

## 2015-11-23 DIAGNOSIS — I252 Old myocardial infarction: Secondary | ICD-10-CM | POA: Diagnosis not present

## 2015-11-23 DIAGNOSIS — F17200 Nicotine dependence, unspecified, uncomplicated: Secondary | ICD-10-CM

## 2015-11-23 DIAGNOSIS — I200 Unstable angina: Secondary | ICD-10-CM | POA: Diagnosis not present

## 2015-11-23 DIAGNOSIS — Z7984 Long term (current) use of oral hypoglycemic drugs: Secondary | ICD-10-CM | POA: Diagnosis not present

## 2015-11-23 DIAGNOSIS — Z951 Presence of aortocoronary bypass graft: Secondary | ICD-10-CM | POA: Diagnosis not present

## 2015-11-23 DIAGNOSIS — R001 Bradycardia, unspecified: Secondary | ICD-10-CM | POA: Diagnosis not present

## 2015-11-23 DIAGNOSIS — J45909 Unspecified asthma, uncomplicated: Secondary | ICD-10-CM | POA: Diagnosis not present

## 2015-11-23 DIAGNOSIS — E119 Type 2 diabetes mellitus without complications: Secondary | ICD-10-CM | POA: Diagnosis not present

## 2015-11-23 LAB — BASIC METABOLIC PANEL
ANION GAP: 12 (ref 5–15)
BUN: 9 mg/dL (ref 6–20)
CALCIUM: 9.2 mg/dL (ref 8.9–10.3)
CHLORIDE: 100 mmol/L — AB (ref 101–111)
CO2: 29 mmol/L (ref 22–32)
Creatinine, Ser: 0.84 mg/dL (ref 0.61–1.24)
GFR calc Af Amer: >60 mL/min (ref >60)
GFR calc non Af Amer: >60 mL/min (ref >60)
GLUCOSE: 116 mg/dL — AB (ref 65–99)
Potassium: 4.2 mmol/L (ref 3.5–5.1)
Sodium: 141 mmol/L (ref 135–145)

## 2015-11-23 LAB — GLUCOSE, CAPILLARY
GLUCOSE-CAPILLARY: 115 mg/dL — AB (ref 65–99)
Glucose-Capillary: 109 mg/dL — ABNORMAL HIGH (ref 65–99)

## 2015-11-23 LAB — MAGNESIUM: Magnesium: 1.7 mg/dL (ref 1.7–2.4)

## 2015-11-23 MED ORDER — METFORMIN HCL 1000 MG PO TABS: 1000.0000 mg | ORAL_TABLET | Freq: Two times a day (BID) | ORAL | Status: DC

## 2015-11-23 MED ORDER — CARVEDILOL 3.125 MG PO TABS: 3.1250 mg | ORAL_TABLET | Freq: Two times a day (BID) | ORAL | Status: DC

## 2015-11-23 MED ORDER — LISINOPRIL 2.5 MG PO TABS: 2.5000 mg | ORAL_TABLET | Freq: Every day | ORAL | Status: DC

## 2015-11-23 MED ORDER — RANOLAZINE ER 1000 MG PO TB12: 1000.0000 mg | ORAL_TABLET | Freq: Two times a day (BID) | ORAL | Status: DC

## 2015-11-23 MED FILL — Heparin Sodium (Porcine) 2 Unit/ML in Sodium Chloride 0.9%: INTRAMUSCULAR | Qty: 500 | Status: AC

## 2015-11-23 MED FILL — Verapamil HCl IV Soln 2.5 MG/ML: INTRAVENOUS | Qty: 2 | Status: AC

## 2015-11-23 NOTE — Progress Notes (Signed)
Subjective: No angina or SOB.    Objective: Vital signs in last 24 hours: Temp:  [98.1 F (36.7 C)-98.9 F (37.2 C)] 98.2 F (36.8 C) (02/14 0753) Pulse Rate:  [0-80] 60 (02/14 0753) Resp:  [0-97] 24 (02/14 0753) BP: (98-140)/(57-90) 109/65 mmHg (02/14 0753) SpO2:  [0 %-99 %] 98 % (02/14 0753) Weight:  [209 lb 7 oz (95 kg)] 209 lb 7 oz (95 kg) (02/14 0609) Last BM Date: 11/20/15  Intake/Output from previous day: 02/13 0701 - 02/14 0700 In: 840 [P.O.:840] Out: -  Intake/Output this shift: Total I/O In: 240 [P.O.:240] Out: -   Medications Scheduled Meds: . aspirin EC  81 mg Oral Daily  . carvedilol  3.125 mg Oral BID WC  . clopidogrel  75 mg Oral Daily  . diazepam  10 mg Oral BID  . enoxaparin (LOVENOX) injection  40 mg Subcutaneous Q24H  . furosemide  20 mg Oral Daily  . gabapentin  600 mg Oral TID  . insulin aspart  0-15 Units Subcutaneous TID WC  . insulin aspart  0-5 Units Subcutaneous QHS  . isosorbide mononitrate  60 mg Oral BID  . lisinopril  2.5 mg Oral Daily  . omega-3 acid ethyl esters  1 g Oral BID  . pantoprazole  40 mg Oral Daily  . pravastatin  80 mg Oral QPM  . ranolazine  1,000 mg Oral BID  . sodium chloride flush  3 mL Intravenous Q12H   Continuous Infusions:  PRN Meds:.sodium chloride, acetaminophen, acetaminophen, albuterol, HYDROcodone-acetaminophen, methocarbamol, nitroGLYCERIN, ondansetron (ZOFRAN) IV, oxyCODONE-acetaminophen, sodium chloride flush  PE: General appearance: alert, cooperative and no distress Lungs: Bilateral rhonchi Heart: regular rate and rhythm, S1, S2 normal, no murmur, click, rub or gallop Extremities: No LEE Pulses: 2+ and symmetric Skin: Warm and dry.  Right groin cath site: nontender, no hematoma.  Neurologic: Grossly normal  Lab Results:   Recent Labs  11/21/15 1941 11/22/15 0319 11/22/15 1450  WBC 8.6 9.0 5.3  HGB 11.9* 12.1* 12.9*  HCT 37.0* 38.2* 40.6  PLT 184 196 189   BMET  Recent Labs  11/21/15 0402 11/21/15 1941 11/22/15 1450 11/23/15 0451  NA 141 142  --  141  K 4.4 3.9  --  4.2  CL 101 104  --  100*  CO2 25 27  --  29  GLUCOSE 136* 177*  --  116*  BUN 11 9  --  9  CREATININE 0.74 0.86 0.80 0.84  CALCIUM 8.8* 8.9  --  9.2   PT/INR  Recent Labs  11/21/15 0409 11/21/15 1941  LABPROT 13.9 13.4  INR 1.05 1.00       Assessment/Plan      Accelerating angina (HCC) SP left heart cath revealing 95% ramus.  Saphenous vein bypass graft disease with total occlusion of the SVG to the distal RCA. The sequential SVG to the diagonal and ramus intermedius is disease with occlusion of the limb to the ramus intermedius. The side-to-side anastomosis on the diagonal remains patent. The stents in the ostium and distal saphenous vein graft sites are widely patent. Patent LIMA to LAD Total occlusion of the native right. The distal right coronary/PDA fills by left-to-right collaterals from the LAD. Segmental diffuse disease in the left main. Total occlusion of the LAD. High-grade obstruction in the circumflex and ramus intermedius originating at the left main. Widely patent LIMA to the LAD When compared to the prior angiographic images, no significant change has occurred. Inferior wall hypokinesis, estimated ejection  fraction 45%  Post cath SCr WNL.  Coreg 3.125bid, ASA, plvix, Imdur 60BID, lisinopril 2.5 daily, ranexa 1000BID, lasix 20 daily.    Mixed hyperlipidemia  pravastatin   Essential hypertension, benign  Controlled.  Mild hypotension.    Sinus bradycardia   Cardiomyopathy, ischemic  EF 45% by angiogram.  Appears euvolemic.  Daily weight monitoring and low sodium diet discussed.       CAD S/P percutaneous coronary angioplasty - PCI of SVG-OM   Coronary artery disease involving coronary bypass graft of native heart with unstable angina pectoris (HCC)   Hypotension due to drugs   Tobacco user  He reports trying to quit. 3-5 min counseling.    LOS: 2 days     HAGER, BRYAN PA-C 11/23/2015 7:55 AM  I have seen, examined and evaluated the patient this AM along with Mr. Leron Croak, [place].  After reviewing all the available data and chart,  I agree with his findings, examination as well as impression recommendations.  Feels better today.  Ambulating with no significant Angina. Cath films & report reviewed.  No notable change from last study, patent stents in remaining SVG. ? If a sequential limb or retrograde filling of a smaller branch is no longer present - no PCI options.  BP is more stable after reducing BB dose - will keep @ 3.125 mg along with ACE-I. Increased Ranexa to 1000 mg BID, continue Imdur BID. Continue ASA &Plavix  He did have elevated LVEDP - curently on PO Lasix 20 mg - consider increasing dose if Sx progress (angina decubitus, DOE, PND/orthopnea)  Needs to stop smoking - options discussed.  Total time (PA & MD ~5 min) Smoking cessation instruction/counseling given:  counseled patient on the dangers of tobacco use, advised patient to stop smoking, and reviewed strategies to maximize success   OK for d/c today.  Close ROV appt.    Marykay Lex, M.D., M.S. Interventional Cardiologist   Pager # [phone] Phone # [phone] [address] [place], [place] [address]

## 2015-11-23 NOTE — Discharge Summary (Signed)
Discharge Summary    Patient ID: [name] [name],  MRN: [id number], DOB/AGE: 49-May-1967 49 y.o.  Admit date: 11/21/2015 Discharge date: 11/23/2015  Primary Care Provider: [name] Primary Cardiologist: Domenic Polite  Discharge Diagnoses    Principal Problem:   Accelerating angina [facility]) Active Problems:   Mixed hyperlipidemia   TOBACCO USER   Essential hypertension, benign   Sinus bradycardia   Cardiomyopathy, ischemic   Coronary atherosclerosis of native coronary artery   Chest pain   CAD S/P percutaneous coronary angioplasty - PCI of SVG-OM   Coronary artery disease involving coronary bypass graft of native heart with unstable angina pectoris (HCC)   Hypotension due to drugs   Allergies No Known Allergies  Diagnostic Studies/Procedures    Procedures    Left Heart Cath and Coronary Angiography    Conclusion     Ramus lesion, 95% stenosed.   Saphenous vein bypass graft disease with total occlusion of the SVG to the distal RCA. The sequential SVG to the diagonal and ramus intermedius is disease with occlusion of the limb to the ramus intermedius. The side-to-side anastomosis on the diagonal remains patent. The stents in the ostium and distal saphenous vein graft sites are widely patent.  Patent LIMA to LAD  Total occlusion of the native right. The distal right coronary/PDA fills by left-to-right collaterals from the LAD. Segmental diffuse disease in the left main. Total occlusion of the LAD. High-grade obstruction in the circumflex and ramus intermedius originating at the left main.  Widely patent LIMA to the LAD  When compared to the prior angiographic images, no significant change has occurred.  Inferior wall hypokinesis, estimated ejection fraction 45%  Recommendations:   Continue medical management. No significant change in anatomy when compared to 01/28/2015.   Diagnostic Diagram          _____________   History of Present Illness       [name] [name] is a 50 year old man with history of coronary artery disease including 5 vessel CABG in 2003 and multiple PCI's, most recently undergoing drug-eluting stent placement to the SVG to diagonal/ramus/OM system and 06/2014, who was transferred to [facility] with chest pain from [facility] ED, [place]. The patient reports that he has had intermittent chest pain for several weeks, predominantly with exertion. He would occasionally take nitroglycerin with relief. However, at approximately 1 PM yesterday (11/20/15), he developed 7/10 chest pain while riding in a car. Upon reaching his home, he noticed that the pain worsened further while walking, prompting him to take 9 nitroglycerin. He noted some transient improvement, though this was complicated by lightheadedness. He describes the pain as a significant pressure radiating to the left arm and elbow accompanied by diaphoresis and shortness of breath. At [facility], he received additional sublingual nitroglycerin without significant improvement. He was then placed on a nitroglycerin infusion and given both morphine and Dilaudid with some relief. A heparin infusion was also started. Currently, his pain is 4/10 in intensity.  The patient reports that he has been compliant with his medications, including dual antiplatelet therapy with aspirin and clopidogrel. His most recent cardiac catheterization was performed on 01/28/15, which showed a diffusely diseased left main coronary artery and ostial LAD occlusion. The LAD fills via a patent LIMA graft. Left circumflex has an 80% ostial/proximal stenosis as well as a 95% lesion in the midportion of the ramus intermedius/OM1. Saphenous vein graft to the system has 20% in-stent restenosis within and ostial stent. Right coronary artery is  occluded in its midsegment. SVG to RCA is known to be occluded. No intervention was performed at the time. Echocardiogram in 12/2013 showed mild LVH with normal  contraction (LVEF 55-60%).  Patient was seen in the emergency department at [facility] 2 days ago for pain and swelling in the left ankle. He was diagnosed with a gout flare and received IV steroids with complete resolution of the symptom within 1 day.  Hospital Course     Consultants: None  The patient was admitted and ruled out for MI.  He underwent left heart cath revealing:   Saphenous vein bypass graft disease with total occlusion of the SVG to the distal RCA. The sequential SVG to the diagonal and ramus intermedius is disease with occlusion of the limb to the ramus intermedius. The side-to-side anastomosis on the diagonal remains patent. The stents in the ostium and distal saphenous vein graft sites are widely patent.  Patent LIMA to LAD  Total occlusion of the native right. The distal right coronary/PDA fills by left-to-right collaterals from the LAD. Segmental diffuse disease in the left main. Total occlusion of the LAD. High-grade obstruction in the circumflex and ramus intermedius originating at the left main.  Widely patent LIMA to the LAD  When compared to the prior angiographic images, no significant change has occurred.  Inferior wall hypokinesis, estimated ejection fraction 45%  He was hypotensive and Coreg was decreased.  Lisinopril was changed to once daily.  Ranexa was added for angina.  imdur continued.  Posta cath SCr WNL.  He ambulated the morning of his discharge without angina or SOB.  The patient was seen by Dr. Ellyn Hack who felt she was stable for DC home. He has  _____________  Discharge Vitals Blood pressure 109/65, pulse 60, temperature 98.2 F (36.8 C), temperature source Oral, resp. rate 16, height '5\' 4"'$  (1.626 m), weight 209 lb 7 oz (95 kg), SpO2 98 %.  Filed Weights   11/21/15 0235 11/21/15 2329 11/23/15 0609  Weight: 210 lb 1.6 oz (95.3 kg) 208 lb 5.4 oz (94.5 kg) 209 lb 7 oz (95 kg)    Labs & Radiologic Studies     CBC  Recent Labs   11/21/15 0402  11/22/15 0319 11/22/15 1450  WBC 9.3  < > 9.0 5.3  NEUTROABS 7.0  --   --   --   HGB 12.0*  < > 12.1* 12.9*  HCT 36.3*  < > 38.2* 40.6  MCV 101.4*  < > 104.1* 103.8*  PLT 194  < > 196 189  < > = values in this interval not displayed. Basic Metabolic Panel  Recent Labs  11/21/15 1941 11/22/15 1450 11/23/15 0451  NA 142  --  141  K 3.9  --  4.2  CL 104  --  100*  CO2 27  --  29  GLUCOSE 177*  --  116*  BUN 9  --  9  CREATININE 0.86 0.80 0.84  CALCIUM 8.9  --  9.2  MG  --  1.6* 1.7   Liver Function Tests  Recent Labs  11/21/15 0402  AST 15  ALT 12*  ALKPHOS 36*  BILITOT 0.5  PROT 6.2*  ALBUMIN 3.5   No results for input(s): LIPASE, AMYLASE in the last 72 hours. Cardiac Enzymes  Recent Labs  11/21/15 0402 11/21/15 0910 11/21/15 1548  TROPONINI <0.03 <0.03 <0.03    Disposition   Pt is being discharged home today in good condition.  Follow-up Plans & Appointments  Follow-up Information    Follow up with [name], MD On 12/13/2015.   Specialty:  Cardiology   Why:  10:20 AM   Contact information:   [place] [address] [phone]      Discharge Instructions    Diet - low sodium heart healthy    Complete by:  As directed      Discharge instructions    Complete by:  As directed   Monitor your weight every morning.  If you gain 3 pounds in 24 hours, or 5 pounds in a week, call the office for instructions.     Increase activity slowly    Complete by:  As directed            Discharge Medications   Current Discharge Medication List    CONTINUE these medications which have CHANGED   Details  carvedilol (COREG) 3.125 MG tablet Take 1 tablet (3.125 mg total) by mouth 2 (two) times daily with a meal. Qty: 60 tablet, Refills: 11    lisinopril (PRINIVIL,ZESTRIL) 2.5 MG tablet Take 1 tablet (2.5 mg total) by mouth daily. Qty: 30 tablet, Refills: 11    metFORMIN (GLUCOPHAGE) 1000 MG tablet Take 1 tablet  (1,000 mg total) by mouth 2 (two) times daily with a meal.    ranolazine (RANEXA) 1000 MG SR tablet Take 1 tablet (1,000 mg total) by mouth 2 (two) times daily. Qty: 60 tablet, Refills: 11      CONTINUE these medications which have NOT CHANGED   Details  albuterol (PROVENTIL HFA;VENTOLIN HFA) 108 (90 BASE) MCG/ACT inhaler Inhale 2 puffs into the lungs every 6 (six) hours as needed for wheezing or shortness of breath.    allopurinol (ZYLOPRIM) 300 MG tablet Take 300 mg by mouth daily. Refills: 0    aspirin EC 81 MG tablet Take 1 tablet (81 mg total) by mouth daily.    Blood Pressure Monitoring KIT 1 each by Does not apply route as directed. Qty: 1 kit, Refills: 0   Associated Diagnoses: Essential hypertension, benign; Sinus bradycardia    clopidogrel (PLAVIX) 75 MG tablet TAKE 1 TABLET BY MOUTH EVERY DAY Qty: 30 tablet, Refills: 10    diazepam (VALIUM) 10 MG tablet Take 10 mg by mouth 2 (two) times daily.    furosemide (LASIX) 20 MG tablet Take 1 tablet (20 mg total) by mouth at bedtime. Qty: 90 tablet, Refills: 3    gabapentin (NEURONTIN) 300 MG capsule Take 600 mg by mouth 3 (three) times daily.     HYDROcodone-acetaminophen (NORCO) 7.5-325 MG per tablet Take 1 tablet by mouth every 6 (six) hours as needed for moderate pain.    isosorbide mononitrate (IMDUR) 60 MG 24 hr tablet Take 1 tablet (60 mg total) by mouth 2 (two) times daily. Qty: 180 tablet, Refills: 3    methocarbamol (ROBAXIN) 750 MG tablet Take 750 mg by mouth every 8 (eight) hours.     nitroGLYCERIN (NITROSTAT) 0.4 MG SL tablet Place 1 tablet (0.4 mg total) under the tongue every 5 (five) minutes x 3 doses as needed for chest pain. If no relief after 3 rd dose, proceed to the ED for an evaluation Qty: 25 tablet, Refills: 3    Omega-3 Fatty Acids (FISH OIL) 1000 MG CAPS Take 1,000-2,000 mg by mouth 2 (two) times daily. 1 IN THE MORNING AND 2 AT BEDTIME    pantoprazole (PROTONIX) 40 MG tablet Take 40 mg by mouth  daily.     pravastatin (  PRAVACHOL) 80 MG tablet Take 1 tablet (80 mg total) by mouth every evening. Qty: 90 tablet, Refills: 3    acetaminophen (TYLENOL) 325 MG tablet Take 2 tablets (650 mg total) by mouth every 4 (four) hours as needed for headache or mild pain.         Aspirin prescribed at discharge?  Yes High Intensity Statin Prescribed? (Lipitor 40-10m or Crestor 20-448m: Yes Beta Blocker Prescribed? Yes For EF 45% or less, Was ACEI/ARB Prescribed?  ADP Receptor Inhibitor Prescribed? (i.e. Plavix etc.-Includes Medically Managed Patients): Yes For EF <40%, Aldosterone Inhibitor Prescribed?  Was EF assessed during THIS hospitalization? Yes Was Cardiac Rehab II ordered? (Included Medically managed Patients): Yes   Outstanding Labs/Studies     Duration of Discharge Encounter   Greater than 30 minutes including physician time.  SiOtilio ConnorsAC 11/23/2015, 8:47 AM

## 2015-11-24 ENCOUNTER — Telehealth: Payer: Self-pay | Admitting: Cardiology

## 2015-11-24 NOTE — Telephone Encounter (Signed)
[name] [name] was discharged over the weekend from [facility].  States that they made changes to some of his medications.  Has questions and concerns about these changes.

## 2015-11-24 NOTE — Telephone Encounter (Signed)
Patient called to make sure it was okay to take medications the way he was told to do so at discharge over the weekend. Patient said that some of his medications were changed. Nurse advised patient to continue all instructions that were given to him during discharge and his cardiologist would address any changes that may be needed at his follow up appointment. Patient verbalized understanding of plan.

## 2015-11-30 DIAGNOSIS — Z7902 Long term (current) use of antithrombotics/antiplatelets: Secondary | ICD-10-CM | POA: Diagnosis not present

## 2015-11-30 DIAGNOSIS — M79671 Pain in right foot: Secondary | ICD-10-CM | POA: Diagnosis not present

## 2015-11-30 DIAGNOSIS — M10471 Other secondary gout, right ankle and foot: Secondary | ICD-10-CM | POA: Diagnosis not present

## 2015-11-30 DIAGNOSIS — Z79899 Other long term (current) drug therapy: Secondary | ICD-10-CM | POA: Diagnosis not present

## 2015-11-30 DIAGNOSIS — M109 Gout, unspecified: Secondary | ICD-10-CM | POA: Diagnosis not present

## 2015-11-30 DIAGNOSIS — Z7982 Long term (current) use of aspirin: Secondary | ICD-10-CM | POA: Diagnosis not present

## 2015-12-01 ENCOUNTER — Telehealth: Payer: Self-pay | Admitting: *Deleted

## 2015-12-01 DIAGNOSIS — M10471 Other secondary gout, right ankle and foot: Secondary | ICD-10-CM | POA: Diagnosis not present

## 2015-12-01 DIAGNOSIS — R079 Chest pain, unspecified: Secondary | ICD-10-CM | POA: Diagnosis not present

## 2015-12-01 DIAGNOSIS — Z7902 Long term (current) use of antithrombotics/antiplatelets: Secondary | ICD-10-CM | POA: Diagnosis not present

## 2015-12-01 DIAGNOSIS — J209 Acute bronchitis, unspecified: Secondary | ICD-10-CM | POA: Diagnosis not present

## 2015-12-01 DIAGNOSIS — Z79899 Other long term (current) drug therapy: Secondary | ICD-10-CM | POA: Diagnosis not present

## 2015-12-01 DIAGNOSIS — R05 Cough: Secondary | ICD-10-CM | POA: Diagnosis not present

## 2015-12-01 DIAGNOSIS — Z7982 Long term (current) use of aspirin: Secondary | ICD-10-CM | POA: Diagnosis not present

## 2015-12-01 DIAGNOSIS — R0602 Shortness of breath: Secondary | ICD-10-CM | POA: Diagnosis not present

## 2015-12-01 NOTE — Telephone Encounter (Signed)
Patient wanted to let office know that he used nitroglcyerin x's one today for chest discomfort rated 4/10. Patient advised that nitroglycerin can be used for severe chest pain every 5 minutes time's 3 dose in a 15 minute increment. Patient advised that if he doesn't have relief after 3rd dose that he needed to proceed to the ED for an evaluation. Patient verbalized understanding of plan. No c/o dizziness or sob.

## 2015-12-02 DIAGNOSIS — M1049 Other secondary gout, multiple sites: Secondary | ICD-10-CM | POA: Diagnosis not present

## 2015-12-02 DIAGNOSIS — N400 Enlarged prostate without lower urinary tract symptoms: Secondary | ICD-10-CM | POA: Diagnosis not present

## 2015-12-06 DIAGNOSIS — M47816 Spondylosis without myelopathy or radiculopathy, lumbar region: Secondary | ICD-10-CM | POA: Diagnosis not present

## 2015-12-06 DIAGNOSIS — Z79891 Long term (current) use of opiate analgesic: Secondary | ICD-10-CM | POA: Diagnosis not present

## 2015-12-06 DIAGNOSIS — M5136 Other intervertebral disc degeneration, lumbar region: Secondary | ICD-10-CM | POA: Diagnosis not present

## 2015-12-06 DIAGNOSIS — E1140 Type 2 diabetes mellitus with diabetic neuropathy, unspecified: Secondary | ICD-10-CM | POA: Diagnosis not present

## 2015-12-13 ENCOUNTER — Ambulatory Visit (INDEPENDENT_AMBULATORY_CARE_PROVIDER_SITE_OTHER): Payer: Medicare Other | Admitting: Cardiology

## 2015-12-13 ENCOUNTER — Encounter: Payer: Self-pay | Admitting: Cardiology

## 2015-12-13 VITALS — BP 122/82 | HR 76 | Ht 63.0 in | Wt 206.0 lb

## 2015-12-13 DIAGNOSIS — E785 Hyperlipidemia, unspecified: Secondary | ICD-10-CM | POA: Diagnosis not present

## 2015-12-13 DIAGNOSIS — I25119 Atherosclerotic heart disease of native coronary artery with unspecified angina pectoris: Secondary | ICD-10-CM

## 2015-12-13 DIAGNOSIS — F17200 Nicotine dependence, unspecified, uncomplicated: Secondary | ICD-10-CM

## 2015-12-13 DIAGNOSIS — I255 Ischemic cardiomyopathy: Secondary | ICD-10-CM

## 2015-12-13 NOTE — Patient Instructions (Signed)
Continue all current medications. Your physician wants you to follow up in:  4 months.  You will receive a reminder letter in the mail one-two months in advance.  If you don't receive a letter, please call our office to schedule the follow up appointment   

## 2015-12-13 NOTE — Progress Notes (Signed)
Cardiology Office Note  Date: 12/13/2015   ID: [name] [name], DOB [birth year], MRN [id number]  PCP: [name], MD  Primary Cardiologist: [name], MD   Chief Complaint  Patient presents with  . Coronary Artery Disease    History of Present Illness: [name] [name] is a medically complex 51 y.o. male last seen in December 2016.  I reviewed interval records. He was admitted to [facility] in February of this year with accelerating angina, ruled out for myocardial infarction, and underwent cardiac catheterization that overall showed no major change in coronary and bypass graft anatomy. Medical therapy was recommended. Ranexa was added to his antianginal regimen.  He presents for a routine visit today. Reports good angina control on present regimen. Also states that his stress level has been lower.  I reviewed his medications which are outlined below. Antianginal regimen is stable with the exception of Ranexa now at 1000 mg twice daily.  Past Medical History  Diagnosis Date  . Coronary atherosclerosis of native coronary artery     a. CABG 2003. b. NSTEMI 2010 s/p BMS to SVG-RCA c. PTCA/stent/asp thromb to SVG-RCA 01/2010, PTCA to SVG-RCA 04/2010 d. known occlusion of SVG-RCA 2013. e. NSTEMI 07/2013  s/p PTCA/DES to SVG-diagonal/intermediate/OM1.   . Mixed hyperlipidemia   . Bipolar disorder ([place])   . GERD (gastroesophageal reflux disease)   . Gout   . Chronic back pain   . Ischemic cardiomyopathy     a. EF 35-40% by cath 07/2013.  . Sinus bradycardia   . Essential hypertension   . Asthma   . Type 2 diabetes mellitus ([place])     Current Outpatient Prescriptions  Medication Sig Dispense Refill  . acetaminophen (TYLENOL) 325 MG tablet Take 2 tablets (650 mg total) by mouth every 4 (four) hours as needed for headache or mild pain.    Marland Kitchen albuterol (PROVENTIL HFA;VENTOLIN HFA) 108 (90 BASE) MCG/ACT inhaler Inhale 2 puffs into the lungs every 6 (six) hours as needed for  wheezing or shortness of breath.    . allopurinol (ZYLOPRIM) 300 MG tablet Take 300 mg by mouth daily.  0  . aspirin EC 81 MG tablet Take 1 tablet (81 mg total) by mouth daily.    . Blood Pressure Monitoring KIT 1 each by Does not apply route as directed. 1 kit 0  . carvedilol (COREG) 3.125 MG tablet Take 1 tablet (3.125 mg total) by mouth 2 (two) times daily with a meal. 60 tablet 11  . clopidogrel (PLAVIX) 75 MG tablet TAKE 1 TABLET BY MOUTH EVERY DAY 30 tablet 10  . diazepam (VALIUM) 10 MG tablet Take 10 mg by mouth 2 (two) times daily.    . furosemide (LASIX) 20 MG tablet Take 1 tablet (20 mg total) by mouth at bedtime. 90 tablet 3  . gabapentin (NEURONTIN) 300 MG capsule Take 600 mg by mouth 3 (three) times daily.     Marland Kitchen HYDROcodone-acetaminophen (NORCO) 7.5-325 MG per tablet Take 1 tablet by mouth every 6 (six) hours as needed for moderate pain.    . isosorbide mononitrate (IMDUR) 60 MG 24 hr tablet Take 1 tablet (60 mg total) by mouth 2 (two) times daily. 180 tablet 3  . lisinopril (PRINIVIL,ZESTRIL) 2.5 MG tablet Take 1 tablet (2.5 mg total) by mouth daily. 30 tablet 11  . metFORMIN (GLUCOPHAGE) 1000 MG tablet Take 1 tablet (1,000 mg total) by mouth 2 (two) times daily with a meal.    . methocarbamol (ROBAXIN) 750  MG tablet Take 750 mg by mouth every 8 (eight) hours.     . nitroGLYCERIN (NITROSTAT) 0.4 MG SL tablet Place 1 tablet (0.4 mg total) under the tongue every 5 (five) minutes x 3 doses as needed for chest pain. If no relief after 3 rd dose, proceed to the ED for an evaluation 25 tablet 3  . Omega-3 Fatty Acids (FISH OIL) 1000 MG CAPS Take 1,000-2,000 mg by mouth 2 (two) times daily. 1 IN THE MORNING AND 2 AT BEDTIME    . pantoprazole (PROTONIX) 40 MG tablet Take 40 mg by mouth daily.     . pravastatin (PRAVACHOL) 80 MG tablet Take 1 tablet (80 mg total) by mouth every evening. 90 tablet 3  . ranolazine (RANEXA) 1000 MG SR tablet Take 1 tablet (1,000 mg total) by mouth 2 (two) times  daily. 60 tablet 11   No current facility-administered medications for this visit.   Allergies:  Review of patient's allergies indicates no known allergies.   Social History: The patient  reports that he has been smoking Cigarettes.  He started smoking about 37 years ago. He has a 26.4 pack-year smoking history. He has never used smokeless tobacco. He reports that he uses illicit drugs. He reports that he does not drink alcohol.   ROS:  Please see the history of present illness. Otherwise, complete review of systems is positive for intermittent gout flares.  All other systems are reviewed and negative.   Physical Exam: VS:  BP 122/82 mmHg  Pulse 76  Ht 5' 3"  (1.6 m)  Wt 206 lb (93.441 kg)  BMI 36.50 kg/m2  SpO2 94%, BMI Body mass index is 36.5 kg/(m^2).  Wt Readings from Last 3 Encounters:  12/13/15 206 lb (93.441 kg)  11/23/15 209 lb 7 oz (95 kg)  10/07/15 211 lb (95.709 kg)    Overweight male no distress.  HEENT: Conjunctiva and lids normal, oropharynx clear with poor dentition.  Neck: Supple, no elevated JVP or carotid bruits, no thyromegaly.  Lungs: Clear to auscultation, nonlabored breathing at rest.  Cardiac: Regular rate and rhythm, no S3, no pericardial rub.  Abdomen: Soft, nontender, bowel sounds present.  Extremities: No pitting edema, distal pulses 2+.   ECG: I personally reviewed the prior tracing from 11/22/2015 which showed sinus bradycardia with borderline old inferior infarct pattern and nonspecific T-wave changes.  Recent Labwork: 11/21/2015: ALT 12*; AST 15 11/22/2015: Hemoglobin 12.9*; Platelets 189 11/23/2015: BUN 9; Creatinine, Ser 0.84; Magnesium 1.7; Potassium 4.2; Sodium 141     Component Value Date/Time   CHOL 113 12/28/2013 0530   TRIG 204* 12/28/2013 0530   HDL 25* 12/28/2013 0530   CHOLHDL 4.5 12/28/2013 0530   VLDL 41* 12/28/2013 0530   LDLCALC 47 12/28/2013 0530    Other Studies Reviewed Today:  Cardiac catheterization 11/22/2015: 1.  Ramus lesion, 95% stenosed. 2. Saphenous vein bypass graft disease with total occlusion of the SVG to the distal RCA. The sequential SVG to the diagonal and ramus intermedius is disease with occlusion of the limb to the ramus intermedius. The side-to-side anastomosis on the diagonal remains patent. The stents in the ostium and distal saphenous vein graft sites are widely patent.  3. Patent LIMA to LAD  4. Total occlusion of the native right. The distal right coronary/PDA fills by    left-to-right collaterals from the LAD. Segmental diffuse disease in the left main. Total occlusion of the LAD. High-grade obstruction in the circumflex and ramus intermedius originating at the left main.  5. Widely patent LIMA to the LAD  When compared to the prior angiographic images, no significant change has occurred. Inferior wall hypokinesis, estimated ejection fraction 45%  Assessment and Plan:  1. Complex multivessel disease with recurring angina. Fortunately, he is clinically stable on present regimen which includes Ranexa at 1000 mg twice daily. Recent cardiac catheterization reviewed with overall stable anatomy.  2. Hyperlipidemia, on statin therapy.  3. Point tobacco abuse. We have discussed smoking cessation and he has not been able to quit.  4. Ischemic cardiopathy with LVEF 45%.  Current medicines were reviewed with the patient today.  Disposition: FU with me in 4 months.   Signed, Satira Sark, MD, [facility] 12/13/2015 10:44 AM    [place] [facility] [place], [place], [place] [address] Phone: [phone]; Fax: [phone]

## 2015-12-14 DIAGNOSIS — Z0000 Encounter for general adult medical examination without abnormal findings: Secondary | ICD-10-CM | POA: Diagnosis not present

## 2015-12-14 DIAGNOSIS — E119 Type 2 diabetes mellitus without complications: Secondary | ICD-10-CM | POA: Diagnosis not present

## 2015-12-14 DIAGNOSIS — J440 Chronic obstructive pulmonary disease with acute lower respiratory infection: Secondary | ICD-10-CM | POA: Diagnosis not present

## 2015-12-14 DIAGNOSIS — Z1389 Encounter for screening for other disorder: Secondary | ICD-10-CM | POA: Diagnosis not present

## 2016-03-07 DIAGNOSIS — M542 Cervicalgia: Secondary | ICD-10-CM | POA: Diagnosis not present

## 2016-03-07 DIAGNOSIS — M79605 Pain in left leg: Secondary | ICD-10-CM | POA: Diagnosis not present

## 2016-03-07 DIAGNOSIS — Z79891 Long term (current) use of opiate analgesic: Secondary | ICD-10-CM | POA: Diagnosis not present

## 2016-03-07 DIAGNOSIS — M545 Low back pain: Secondary | ICD-10-CM | POA: Diagnosis not present

## 2016-03-07 DIAGNOSIS — M25552 Pain in left hip: Secondary | ICD-10-CM | POA: Diagnosis not present

## 2016-03-07 DIAGNOSIS — G894 Chronic pain syndrome: Secondary | ICD-10-CM | POA: Diagnosis not present

## 2016-03-10 DIAGNOSIS — M25561 Pain in right knee: Secondary | ICD-10-CM | POA: Diagnosis not present

## 2016-03-10 DIAGNOSIS — Z7984 Long term (current) use of oral hypoglycemic drugs: Secondary | ICD-10-CM | POA: Diagnosis not present

## 2016-03-10 DIAGNOSIS — S8991XA Unspecified injury of right lower leg, initial encounter: Secondary | ICD-10-CM | POA: Diagnosis not present

## 2016-03-10 DIAGNOSIS — Z951 Presence of aortocoronary bypass graft: Secondary | ICD-10-CM | POA: Diagnosis not present

## 2016-03-10 DIAGNOSIS — E119 Type 2 diabetes mellitus without complications: Secondary | ICD-10-CM | POA: Diagnosis not present

## 2016-03-10 DIAGNOSIS — M545 Low back pain: Secondary | ICD-10-CM | POA: Diagnosis not present

## 2016-03-10 DIAGNOSIS — I10 Essential (primary) hypertension: Secondary | ICD-10-CM | POA: Diagnosis not present

## 2016-03-10 DIAGNOSIS — Z79899 Other long term (current) drug therapy: Secondary | ICD-10-CM | POA: Diagnosis not present

## 2016-03-10 DIAGNOSIS — J449 Chronic obstructive pulmonary disease, unspecified: Secondary | ICD-10-CM | POA: Diagnosis not present

## 2016-03-10 DIAGNOSIS — I2510 Atherosclerotic heart disease of native coronary artery without angina pectoris: Secondary | ICD-10-CM | POA: Diagnosis not present

## 2016-03-10 DIAGNOSIS — M25551 Pain in right hip: Secondary | ICD-10-CM | POA: Diagnosis not present

## 2016-03-11 DIAGNOSIS — M25551 Pain in right hip: Secondary | ICD-10-CM | POA: Diagnosis not present

## 2016-03-11 DIAGNOSIS — M545 Low back pain: Secondary | ICD-10-CM | POA: Diagnosis not present

## 2016-03-11 DIAGNOSIS — S8991XA Unspecified injury of right lower leg, initial encounter: Secondary | ICD-10-CM | POA: Diagnosis not present

## 2016-03-11 DIAGNOSIS — M25561 Pain in right knee: Secondary | ICD-10-CM | POA: Diagnosis not present

## 2016-03-16 DIAGNOSIS — J440 Chronic obstructive pulmonary disease with acute lower respiratory infection: Secondary | ICD-10-CM | POA: Diagnosis not present

## 2016-03-16 DIAGNOSIS — Z0000 Encounter for general adult medical examination without abnormal findings: Secondary | ICD-10-CM | POA: Diagnosis not present

## 2016-03-16 DIAGNOSIS — E119 Type 2 diabetes mellitus without complications: Secondary | ICD-10-CM | POA: Diagnosis not present

## 2016-03-21 DIAGNOSIS — Z79891 Long term (current) use of opiate analgesic: Secondary | ICD-10-CM | POA: Diagnosis not present

## 2016-03-21 DIAGNOSIS — M79662 Pain in left lower leg: Secondary | ICD-10-CM | POA: Diagnosis not present

## 2016-03-21 DIAGNOSIS — M79661 Pain in right lower leg: Secondary | ICD-10-CM | POA: Diagnosis not present

## 2016-03-21 DIAGNOSIS — M545 Low back pain: Secondary | ICD-10-CM | POA: Diagnosis not present

## 2016-03-21 DIAGNOSIS — G894 Chronic pain syndrome: Secondary | ICD-10-CM | POA: Diagnosis not present

## 2016-04-13 ENCOUNTER — Encounter: Payer: Self-pay | Admitting: Cardiology

## 2016-04-13 ENCOUNTER — Ambulatory Visit (INDEPENDENT_AMBULATORY_CARE_PROVIDER_SITE_OTHER): Payer: Medicare Other | Admitting: Cardiology

## 2016-04-13 VITALS — BP 117/77 | HR 70 | Ht 63.0 in | Wt 208.0 lb

## 2016-04-13 DIAGNOSIS — F17200 Nicotine dependence, unspecified, uncomplicated: Secondary | ICD-10-CM

## 2016-04-13 DIAGNOSIS — I25119 Atherosclerotic heart disease of native coronary artery with unspecified angina pectoris: Secondary | ICD-10-CM

## 2016-04-13 DIAGNOSIS — I255 Ischemic cardiomyopathy: Secondary | ICD-10-CM

## 2016-04-13 MED ORDER — CARVEDILOL 3.125 MG PO TABS: 3.1250 mg | ORAL_TABLET | Freq: Two times a day (BID) | ORAL | Status: DC

## 2016-04-13 MED ORDER — LISINOPRIL 2.5 MG PO TABS: 2.5000 mg | ORAL_TABLET | Freq: Every day | ORAL | Status: DC

## 2016-04-13 MED ORDER — FUROSEMIDE 20 MG PO TABS: 20.0000 mg | ORAL_TABLET | Freq: Every day | ORAL | Status: DC

## 2016-04-13 MED ORDER — RANOLAZINE ER 1000 MG PO TB12: 1000.0000 mg | ORAL_TABLET | Freq: Two times a day (BID) | ORAL | Status: DC

## 2016-04-13 MED ORDER — ISOSORBIDE MONONITRATE ER 60 MG PO TB24: 60.0000 mg | ORAL_TABLET | Freq: Two times a day (BID) | ORAL | Status: DC

## 2016-04-13 MED ORDER — CLOPIDOGREL BISULFATE 75 MG PO TABS: 75.0000 mg | ORAL_TABLET | Freq: Every day | ORAL | Status: DC

## 2016-04-13 MED ORDER — PRAVASTATIN SODIUM 80 MG PO TABS: 80.0000 mg | ORAL_TABLET | Freq: Every evening | ORAL | Status: DC

## 2016-04-13 NOTE — Patient Instructions (Signed)
Your physician wants you to follow-up in: 6 months with Dr. McDowell You will receive a reminder letter in the mail two months in advance. If you don't receive a letter, please call our office to schedule the follow-up appointment.  Your physician recommends that you continue on your current medications as directed. Please refer to the Current Medication list given to you today.  Thank you for choosing [facility]!!    

## 2016-04-13 NOTE — Progress Notes (Signed)
Cardiology Office Note  Date: 04/13/2016   ID: [name] [name], DOB [birth year], MRN [id number]  PCP: [name], MD  Primary Cardiologist: [name], MD   Chief Complaint  Patient presents with  . Coronary Artery Disease    History of Present Illness: [name] [name] is a medically complex 50 y.o. male last seen in March. He presents for a routine follow-up visit. Fortunately, since last encounter he has had no subsequent hospitalizations or progressive angina on present medical regimen. Most recent addition was Ranexa. His cardiac catheterization from earlier this year is outlined below showing overall stable anatomy. He reports NYHA class II dyspnea, no palpitations or syncope. She smokes cigarettes regularly, has not been able to quit and is not motivated to do so. He does report compliance with his medications however.  Past Medical History  Diagnosis Date  . Coronary atherosclerosis of native coronary artery     a. CABG 2003. b. NSTEMI 2010 s/p BMS to SVG-RCA c. PTCA/stent/asp thromb to SVG-RCA 01/2010, PTCA to SVG-RCA 04/2010 d. known occlusion of SVG-RCA 2013. e. NSTEMI 07/2013  s/p PTCA/DES to SVG-diagonal/intermediate/OM1.   . Mixed hyperlipidemia   . Bipolar disorder ([place])   . GERD (gastroesophageal reflux disease)   . Gout   . Chronic back pain   . Ischemic cardiomyopathy     a. EF 35-40% by cath 07/2013.  . Sinus bradycardia   . Essential hypertension   . Asthma   . Type 2 diabetes mellitus ([place])     Current Outpatient Prescriptions  Medication Sig Dispense Refill  . acetaminophen (TYLENOL) 325 MG tablet Take 2 tablets (650 mg total) by mouth every 4 (four) hours as needed for headache or mild pain.    Marland Kitchen albuterol (PROVENTIL HFA;VENTOLIN HFA) 108 (90 BASE) MCG/ACT inhaler Inhale 2 puffs into the lungs every 6 (six) hours as needed for wheezing or shortness of breath.    . allopurinol (ZYLOPRIM) 300 MG tablet Take 300 mg by mouth daily.  0  . aspirin EC 81  MG tablet Take 1 tablet (81 mg total) by mouth daily.    . Blood Pressure Monitoring KIT 1 each by Does not apply route as directed. 1 kit 0  . carvedilol (COREG) 3.125 MG tablet Take 1 tablet (3.125 mg total) by mouth 2 (two) times daily with a meal. 180 tablet 3  . clopidogrel (PLAVIX) 75 MG tablet Take 1 tablet (75 mg total) by mouth daily. 90 tablet 3  . diazepam (VALIUM) 10 MG tablet Take 10 mg by mouth 2 (two) times daily.    . furosemide (LASIX) 20 MG tablet Take 1 tablet (20 mg total) by mouth at bedtime. 90 tablet 3  . gabapentin (NEURONTIN) 300 MG capsule Take 600 mg by mouth 3 (three) times daily.     Marland Kitchen HYDROcodone-acetaminophen (NORCO) 7.5-325 MG per tablet Take 1 tablet by mouth every 6 (six) hours as needed for moderate pain.    . isosorbide mononitrate (IMDUR) 60 MG 24 hr tablet Take 1 tablet (60 mg total) by mouth 2 (two) times daily. 180 tablet 3  . lisinopril (PRINIVIL,ZESTRIL) 2.5 MG tablet Take 1 tablet (2.5 mg total) by mouth daily. 90 tablet 3  . metFORMIN (GLUCOPHAGE) 1000 MG tablet Take 1 tablet (1,000 mg total) by mouth 2 (two) times daily with a meal.    . methocarbamol (ROBAXIN) 750 MG tablet Take 750 mg by mouth every 8 (eight) hours.     . nitroGLYCERIN (NITROSTAT) 0.4 MG  SL tablet Place 1 tablet (0.4 mg total) under the tongue every 5 (five) minutes x 3 doses as needed for chest pain. If no relief after 3 rd dose, proceed to the ED for an evaluation 25 tablet 3  . Omega-3 Fatty Acids (FISH OIL) 1000 MG CAPS Take 1,000-2,000 mg by mouth 2 (two) times daily. 1 IN THE MORNING AND 2 AT BEDTIME    . pantoprazole (PROTONIX) 40 MG tablet Take 40 mg by mouth daily.     . pravastatin (PRAVACHOL) 80 MG tablet Take 1 tablet (80 mg total) by mouth every evening. 90 tablet 3  . ranolazine (RANEXA) 1000 MG SR tablet Take 1 tablet (1,000 mg total) by mouth 2 (two) times daily. 180 tablet 3   No current facility-administered medications for this visit.   Allergies:  Review of  patient's allergies indicates no known allergies.   Social History: The patient  reports that he has been smoking Cigarettes.  He started smoking about 37 years ago. He has a 26.4 pack-year smoking history. He has never used smokeless tobacco. He reports that he uses illicit drugs. He reports that he does not drink alcohol.   ROS:  Please see the history of present illness. Otherwise, complete review of systems is positive for psychosocial stressors.  All other systems are reviewed and negative.   Physical Exam: VS:  BP 117/77 mmHg  Pulse 70  Ht _0  (1.6 m)  Wt 208 lb (94.348 kg)  BMI 36.85 kg/m2  SpO2 94%, BMI Body mass index is 36.85 kg/(m^2).  Wt Readings from Last 3 Encounters:  04/13/16 208 lb (94.348 kg)  12/13/15 206 lb (93.441 kg)  11/23/15 209 lb 7 oz (95 kg)    Overweight male no distress.  HEENT: Conjunctiva and lids normal, oropharynx clear with poor dentition.  Neck: Supple, no elevated JVP or carotid bruits, no thyromegaly.  Lungs: Clear to auscultation, nonlabored breathing at rest.  Cardiac: Regular rate and rhythm, no S3, no pericardial rub.  Abdomen: Soft, nontender, bowel sounds present.  Extremities: No pitting edema, distal pulses 2+.   ECG: I personally reviewed the tracing from  Recent Labwork: 11/21/2015: ALT 12*; AST 15 11/22/2015: Hemoglobin 12.9*; Platelets 189 11/23/2015: BUN 9; Creatinine, Ser 0.84; Magnesium 1.7; Potassium 4.2; Sodium 141     Component Value Date/Time   CHOL 113 12/28/2013 0530   TRIG 204* 12/28/2013 0530   HDL 25* 12/28/2013 0530   CHOLHDL 4.5 12/28/2013 0530   VLDL 41* 12/28/2013 0530   LDLCALC 47 12/28/2013 0530    Other Studies Reviewed Today:  Cardiac catheterization 11/22/2015: 1. Ramus lesion, 95% stenosed. 2. Saphenous vein bypass graft disease with total occlusion of the SVG to the distal RCA. The sequential SVG to the diagonal and ramus intermedius is disease with occlusion of the limb to the ramus  intermedius. The side-to-side anastomosis on the diagonal remains patent. The stents in the ostium and distal saphenous vein graft sites are widely patent. 3. Patent LIMA to LAD 4. Total occlusion of the native right. The distal right coronary/PDA fills byleft-to-right collaterals from the LAD. Segmental diffuse disease in the left main. Total occlusion of the LAD. High-grade obstruction in the circumflex and ramus intermedius originating at the left main. 5. Widely patent LIMA to the LAD  When compared to the prior angiographic images, no significant change has occurred. Inferior wall hypokinesis, estimated ejection fraction 45%  Assessment and Plan:  1. Complex multivessel CAD with recurring angina, although recently stable on  present medical regimen. Reinforced compliance with his medications. Continue observation for now.  2. Ongoing tobacco abuse. Smoking cessation discussed several times, although he has not been able to quit and lacks motivation to do so.  3. Ischemic cardiomyopathy with LVEF 45%.  Current medicines were reviewed with the patient today.  Disposition: Follow-up with me in 6 months.   Signed, Satira Sark, MD, [facility] 04/13/2016 11:46 AM    [place] [facility] [place], [place], [place] [address] Phone: [phone]; Fax: [phone]

## 2016-05-09 DIAGNOSIS — M79662 Pain in left lower leg: Secondary | ICD-10-CM | POA: Diagnosis not present

## 2016-05-09 DIAGNOSIS — Z79891 Long term (current) use of opiate analgesic: Secondary | ICD-10-CM | POA: Diagnosis not present

## 2016-05-09 DIAGNOSIS — G894 Chronic pain syndrome: Secondary | ICD-10-CM | POA: Diagnosis not present

## 2016-05-09 DIAGNOSIS — M545 Low back pain: Secondary | ICD-10-CM | POA: Diagnosis not present

## 2016-05-09 DIAGNOSIS — M79661 Pain in right lower leg: Secondary | ICD-10-CM | POA: Diagnosis not present

## 2016-05-23 DIAGNOSIS — M79661 Pain in right lower leg: Secondary | ICD-10-CM | POA: Diagnosis not present

## 2016-05-23 DIAGNOSIS — M545 Low back pain: Secondary | ICD-10-CM | POA: Diagnosis not present

## 2016-05-23 DIAGNOSIS — Z79891 Long term (current) use of opiate analgesic: Secondary | ICD-10-CM | POA: Diagnosis not present

## 2016-05-23 DIAGNOSIS — G894 Chronic pain syndrome: Secondary | ICD-10-CM | POA: Diagnosis not present

## 2016-05-23 DIAGNOSIS — M79605 Pain in left leg: Secondary | ICD-10-CM | POA: Diagnosis not present

## 2016-05-23 DIAGNOSIS — M79662 Pain in left lower leg: Secondary | ICD-10-CM | POA: Diagnosis not present

## 2016-05-25 DIAGNOSIS — E119 Type 2 diabetes mellitus without complications: Secondary | ICD-10-CM | POA: Diagnosis not present

## 2016-05-25 DIAGNOSIS — J440 Chronic obstructive pulmonary disease with acute lower respiratory infection: Secondary | ICD-10-CM | POA: Diagnosis not present

## 2016-06-20 DIAGNOSIS — M79605 Pain in left leg: Secondary | ICD-10-CM | POA: Diagnosis not present

## 2016-06-20 DIAGNOSIS — M79604 Pain in right leg: Secondary | ICD-10-CM | POA: Diagnosis not present

## 2016-06-20 DIAGNOSIS — Z79891 Long term (current) use of opiate analgesic: Secondary | ICD-10-CM | POA: Diagnosis not present

## 2016-06-20 DIAGNOSIS — M545 Low back pain: Secondary | ICD-10-CM | POA: Diagnosis not present

## 2016-06-20 DIAGNOSIS — M79662 Pain in left lower leg: Secondary | ICD-10-CM | POA: Diagnosis not present

## 2016-06-20 DIAGNOSIS — G894 Chronic pain syndrome: Secondary | ICD-10-CM | POA: Diagnosis not present

## 2016-06-29 ENCOUNTER — Other Ambulatory Visit: Payer: Self-pay | Admitting: Cardiology

## 2016-07-05 DIAGNOSIS — E119 Type 2 diabetes mellitus without complications: Secondary | ICD-10-CM | POA: Diagnosis not present

## 2016-07-18 DIAGNOSIS — M25551 Pain in right hip: Secondary | ICD-10-CM | POA: Diagnosis not present

## 2016-07-18 DIAGNOSIS — Z79891 Long term (current) use of opiate analgesic: Secondary | ICD-10-CM | POA: Diagnosis not present

## 2016-07-18 DIAGNOSIS — M546 Pain in thoracic spine: Secondary | ICD-10-CM | POA: Diagnosis not present

## 2016-07-18 DIAGNOSIS — G894 Chronic pain syndrome: Secondary | ICD-10-CM | POA: Diagnosis not present

## 2016-07-18 DIAGNOSIS — M545 Low back pain: Secondary | ICD-10-CM | POA: Diagnosis not present

## 2016-07-18 DIAGNOSIS — M25552 Pain in left hip: Secondary | ICD-10-CM | POA: Diagnosis not present

## 2016-07-25 DIAGNOSIS — M1009 Idiopathic gout, multiple sites: Secondary | ICD-10-CM | POA: Diagnosis not present

## 2016-07-25 DIAGNOSIS — M25571 Pain in right ankle and joints of right foot: Secondary | ICD-10-CM | POA: Diagnosis not present

## 2016-07-25 DIAGNOSIS — E119 Type 2 diabetes mellitus without complications: Secondary | ICD-10-CM | POA: Diagnosis not present

## 2016-07-25 DIAGNOSIS — J440 Chronic obstructive pulmonary disease with acute lower respiratory infection: Secondary | ICD-10-CM | POA: Diagnosis not present

## 2016-08-15 DIAGNOSIS — G894 Chronic pain syndrome: Secondary | ICD-10-CM | POA: Diagnosis not present

## 2016-08-15 DIAGNOSIS — M25551 Pain in right hip: Secondary | ICD-10-CM | POA: Diagnosis not present

## 2016-08-15 DIAGNOSIS — Z79891 Long term (current) use of opiate analgesic: Secondary | ICD-10-CM | POA: Diagnosis not present

## 2016-08-15 DIAGNOSIS — M545 Low back pain: Secondary | ICD-10-CM | POA: Diagnosis not present

## 2016-08-15 DIAGNOSIS — M546 Pain in thoracic spine: Secondary | ICD-10-CM | POA: Diagnosis not present

## 2016-08-15 DIAGNOSIS — M25552 Pain in left hip: Secondary | ICD-10-CM | POA: Diagnosis not present

## 2016-09-11 DIAGNOSIS — M545 Low back pain: Secondary | ICD-10-CM | POA: Diagnosis not present

## 2016-09-11 DIAGNOSIS — M25552 Pain in left hip: Secondary | ICD-10-CM | POA: Diagnosis not present

## 2016-09-11 DIAGNOSIS — M25551 Pain in right hip: Secondary | ICD-10-CM | POA: Diagnosis not present

## 2016-09-11 DIAGNOSIS — Z79891 Long term (current) use of opiate analgesic: Secondary | ICD-10-CM | POA: Diagnosis not present

## 2016-09-11 DIAGNOSIS — G894 Chronic pain syndrome: Secondary | ICD-10-CM | POA: Diagnosis not present

## 2016-09-11 DIAGNOSIS — M79605 Pain in left leg: Secondary | ICD-10-CM | POA: Diagnosis not present

## 2016-10-11 ENCOUNTER — Encounter: Payer: Self-pay | Admitting: *Deleted

## 2016-10-12 ENCOUNTER — Ambulatory Visit (INDEPENDENT_AMBULATORY_CARE_PROVIDER_SITE_OTHER): Payer: Medicare Other | Admitting: Cardiology

## 2016-10-12 ENCOUNTER — Encounter: Payer: Self-pay | Admitting: Cardiology

## 2016-10-12 VITALS — BP 118/80 | HR 55 | Ht 64.0 in | Wt 215.0 lb

## 2016-10-12 DIAGNOSIS — I255 Ischemic cardiomyopathy: Secondary | ICD-10-CM

## 2016-10-12 DIAGNOSIS — F17200 Nicotine dependence, unspecified, uncomplicated: Secondary | ICD-10-CM | POA: Diagnosis not present

## 2016-10-12 DIAGNOSIS — I25119 Atherosclerotic heart disease of native coronary artery with unspecified angina pectoris: Secondary | ICD-10-CM | POA: Diagnosis not present

## 2016-10-12 NOTE — Patient Instructions (Signed)

## 2016-10-12 NOTE — Progress Notes (Signed)
Cardiology Office Note  Date: 10/12/2016   ID: [name] [name], DOB [birth year], MRN [id number]  PCP: [name], MD  Primary Cardiologist: [name], MD   Chief Complaint  Patient presents with  . Coronary Artery Disease    History of Present Illness: [name] [name] is a medically complex 51 y.o. male last seen in July 2017. He presents for a follow-up visit. Fortunately, reports no progressive angina symptoms since last encounter. He has had some very mild angina in the very cold weather, does not require nitroglycerin however.  I reviewed his medications which are outlined below. He continues on aspirin, Coreg, Plavix, Lasix, Imdur, lisinopril, Ranexa, Pravachol, and as needed nitroglycerin.  Past Medical History:  Diagnosis Date  . Asthma   . Bipolar disorder ([place])   . Chronic back pain   . Coronary atherosclerosis of native coronary artery    a. CABG 2003. b. NSTEMI 2010 s/p BMS to SVG-RCA c. PTCA/stent/asp thromb to SVG-RCA 01/2010, PTCA to SVG-RCA 04/2010 d. known occlusion of SVG-RCA 2013. e. NSTEMI 07/2013  s/p PTCA/DES to SVG-diagonal/intermediate/OM1.   . Essential hypertension   . GERD (gastroesophageal reflux disease)   . Gout   . Ischemic cardiomyopathy    a. EF 35-40% by cath 07/2013.  . Mixed hyperlipidemia   . Sinus bradycardia   . Type 2 diabetes mellitus ([employer])     Current Outpatient Prescriptions  Medication Sig Dispense Refill  . acetaminophen (TYLENOL) 325 MG tablet Take 2 tablets (650 mg total) by mouth every 4 (four) hours as needed for headache or mild pain.    Marland Kitchen albuterol (PROVENTIL HFA;VENTOLIN HFA) 108 (90 BASE) MCG/ACT inhaler Inhale 2 puffs into the lungs every 6 (six) hours as needed for wheezing or shortness of breath.    . allopurinol (ZYLOPRIM) 300 MG tablet Take 300 mg by mouth daily.  0  . aspirin EC 81 MG tablet Take 1 tablet (81 mg total) by mouth daily.    . Blood Pressure Monitoring KIT 1 each by Does not apply route as  directed. 1 kit 0  . carvedilol (COREG) 3.125 MG tablet Take 1 tablet (3.125 mg total) by mouth 2 (two) times daily with a meal. 180 tablet 3  . clopidogrel (PLAVIX) 75 MG tablet Take 1 tablet (75 mg total) by mouth daily. 90 tablet 3  . diazepam (VALIUM) 10 MG tablet Take 10 mg by mouth 2 (two) times daily.    . furosemide (LASIX) 20 MG tablet Take 1 tablet (20 mg total) by mouth at bedtime. 90 tablet 3  . gabapentin (NEURONTIN) 300 MG capsule Take 600 mg by mouth 3 (three) times daily.     Marland Kitchen HYDROcodone-acetaminophen (NORCO) 7.5-325 MG per tablet Take 1 tablet by mouth every 6 (six) hours as needed for moderate pain.    . isosorbide mononitrate (IMDUR) 60 MG 24 hr tablet Take 1 tablet (60 mg total) by mouth 2 (two) times daily. 180 tablet 3  . lisinopril (PRINIVIL,ZESTRIL) 2.5 MG tablet Take 1 tablet (2.5 mg total) by mouth daily. 90 tablet 3  . metFORMIN (GLUCOPHAGE) 1000 MG tablet Take 1 tablet (1,000 mg total) by mouth 2 (two) times daily with a meal.    . methocarbamol (ROBAXIN) 750 MG tablet Take 750 mg by mouth every 8 (eight) hours.     . nitroGLYCERIN (NITROSTAT) 0.4 MG SL tablet PLACE 1 TABLET UNDER THE TONGUE AS NEEDED FOR CHEST PAIN. MAY REPEAT EVERY 5 MINUTES UP TO 3 DOSES. IF  NO RELIEF AFTER 3 DOSES, CALL 911 OR 25 tablet 3  . Omega-3 Fatty Acids (FISH OIL) 1000 MG CAPS Take 1,000-2,000 mg by mouth 2 (two) times daily. 1 IN THE MORNING AND 2 AT BEDTIME    . pantoprazole (PROTONIX) 40 MG tablet Take 40 mg by mouth daily.     . pravastatin (PRAVACHOL) 80 MG tablet Take 1 tablet (80 mg total) by mouth every evening. 90 tablet 3  . ranolazine (RANEXA) 1000 MG SR tablet Take 1 tablet (1,000 mg total) by mouth 2 (two) times daily. 180 tablet 3   No current facility-administered medications for this visit.    Allergies:  Patient has no known allergies.   Social History: The patient  reports that he has been smoking Cigarettes.  He started smoking about 38 years ago. He has a 26.40  pack-year smoking history. He has never used smokeless tobacco. He reports that he uses drugs. He reports that he does not drink alcohol.   ROS:  Please see the history of present illness. Otherwise, complete review of systems is positive for NYHA class II dyspnea.  All other systems are reviewed and negative.   Physical Exam: VS:  BP 118/80   Pulse (!) 55   Ht 5' 4"  (1.626 m)   Wt 215 lb (97.5 kg)   SpO2 99%   BMI 36.90 kg/m , BMI Body mass index is 36.9 kg/m.  Wt Readings from Last 3 Encounters:  10/12/16 215 lb (97.5 kg)  04/13/16 208 lb (94.3 kg)  12/13/15 206 lb (93.4 kg)    Overweight male no distress.  HEENT: Conjunctiva and lids normal, oropharynx clear with poor dentition.  Neck: Supple, no elevated JVP or carotid bruits, no thyromegaly.  Lungs: Clear to auscultation, nonlabored breathing at rest.  Cardiac: Regular rate and rhythm, no S3, no pericardial rub.  Abdomen: Soft, nontender, bowel sounds present.  Extremities: No pitting edema, distal pulses 2+.   ECG: I personally reviewed the tracing from 12/20/2015 which showed sinus bradycardia, probable old inferior infarct pattern, nonspecific T-wave changes.  Recent Labwork: 11/21/2015: ALT 12; AST 15 11/22/2015: Hemoglobin 12.9; Platelets 189 11/23/2015: BUN 9; Creatinine, Ser 0.84; Magnesium 1.7; Potassium 4.2; Sodium 141     Component Value Date/Time   CHOL 113 12/28/2013 0530   TRIG 204 (H) 12/28/2013 0530   HDL 25 (L) 12/28/2013 0530   CHOLHDL 4.5 12/28/2013 0530   VLDL 41 (H) 12/28/2013 0530   LDLCALC 47 12/28/2013 0530    Other Studies Reviewed Today:  Cardiac catheterization 11/22/2015: 1. Ramus lesion, 95% stenosed. 2. Saphenous vein bypass graft disease with total occlusion of the SVG to the distal RCA. The sequential SVG to the diagonal and ramus intermedius is disease with occlusion of the limb to the ramus intermedius. The side-to-side anastomosis on the diagonal remains patent. The stents in the  ostium and distal saphenous vein graft sites are widely patent. 3. Patent LIMA to LAD 4. Total occlusion of the native right. The distal right coronary/PDA fills byleft-to-right collaterals from the LAD. Segmental diffuse disease in the left main. Total occlusion of the LAD. High-grade obstruction in the circumflex and ramus intermedius originating at the left main. 5. Widely patent LIMA to the LAD  When compared to the prior angiographic images, no significant change has occurred. Inferior wall hypokinesis, estimated ejection fraction 45%  Assessment and Plan:  1. Symptomatically stable complex multivessel CAD on current medical regimen. Most recent cardiac catheterization from February 2017 is outlined above.  2. Tobacco abuse, smoking cessation recommended however he has not been motivated to quit.  3. Ischemic cardiomyopathy with mildly reduced LVEF of 45%.  Current medicines were reviewed with the patient today.  Disposition: Follow-up in 6 months.  Signed, Satira Sark, MD, [facility] 10/12/2016 3:30 PM    [place] at [place], [place], [place] [address] Phone: [phone]; Fax: [phone]

## 2016-10-16 DIAGNOSIS — G894 Chronic pain syndrome: Secondary | ICD-10-CM | POA: Diagnosis not present

## 2016-10-16 DIAGNOSIS — M545 Low back pain: Secondary | ICD-10-CM | POA: Diagnosis not present

## 2016-10-16 DIAGNOSIS — M25552 Pain in left hip: Secondary | ICD-10-CM | POA: Diagnosis not present

## 2016-10-16 DIAGNOSIS — M25551 Pain in right hip: Secondary | ICD-10-CM | POA: Diagnosis not present

## 2016-10-16 DIAGNOSIS — M546 Pain in thoracic spine: Secondary | ICD-10-CM | POA: Diagnosis not present

## 2016-10-16 DIAGNOSIS — Z79891 Long term (current) use of opiate analgesic: Secondary | ICD-10-CM | POA: Diagnosis not present

## 2016-11-03 DIAGNOSIS — J440 Chronic obstructive pulmonary disease with acute lower respiratory infection: Secondary | ICD-10-CM | POA: Diagnosis not present

## 2016-11-03 DIAGNOSIS — E119 Type 2 diabetes mellitus without complications: Secondary | ICD-10-CM | POA: Diagnosis not present

## 2016-11-03 DIAGNOSIS — M1009 Idiopathic gout, multiple sites: Secondary | ICD-10-CM | POA: Diagnosis not present

## 2016-11-03 DIAGNOSIS — Z0000 Encounter for general adult medical examination without abnormal findings: Secondary | ICD-10-CM | POA: Diagnosis not present

## 2016-11-13 ENCOUNTER — Other Ambulatory Visit: Payer: Self-pay | Admitting: Cardiology

## 2016-11-13 DIAGNOSIS — Z79891 Long term (current) use of opiate analgesic: Secondary | ICD-10-CM | POA: Diagnosis not present

## 2016-11-13 DIAGNOSIS — G894 Chronic pain syndrome: Secondary | ICD-10-CM | POA: Diagnosis not present

## 2016-11-13 DIAGNOSIS — G890 Central pain syndrome: Secondary | ICD-10-CM | POA: Diagnosis not present

## 2016-11-13 DIAGNOSIS — M25552 Pain in left hip: Secondary | ICD-10-CM | POA: Diagnosis not present

## 2016-11-13 DIAGNOSIS — M545 Low back pain: Secondary | ICD-10-CM | POA: Diagnosis not present

## 2016-11-13 DIAGNOSIS — M546 Pain in thoracic spine: Secondary | ICD-10-CM | POA: Diagnosis not present

## 2016-12-11 DIAGNOSIS — Z79891 Long term (current) use of opiate analgesic: Secondary | ICD-10-CM | POA: Diagnosis not present

## 2016-12-11 DIAGNOSIS — M5417 Radiculopathy, lumbosacral region: Secondary | ICD-10-CM | POA: Diagnosis not present

## 2016-12-11 DIAGNOSIS — G894 Chronic pain syndrome: Secondary | ICD-10-CM | POA: Diagnosis not present

## 2016-12-11 DIAGNOSIS — M25551 Pain in right hip: Secondary | ICD-10-CM | POA: Diagnosis not present

## 2016-12-11 DIAGNOSIS — M79661 Pain in right lower leg: Secondary | ICD-10-CM | POA: Diagnosis not present

## 2016-12-11 DIAGNOSIS — M25552 Pain in left hip: Secondary | ICD-10-CM | POA: Diagnosis not present

## 2017-01-12 DIAGNOSIS — M25552 Pain in left hip: Secondary | ICD-10-CM | POA: Diagnosis not present

## 2017-01-12 DIAGNOSIS — M545 Low back pain: Secondary | ICD-10-CM | POA: Diagnosis not present

## 2017-01-12 DIAGNOSIS — M25551 Pain in right hip: Secondary | ICD-10-CM | POA: Diagnosis not present

## 2017-01-12 DIAGNOSIS — M546 Pain in thoracic spine: Secondary | ICD-10-CM | POA: Diagnosis not present

## 2017-01-12 DIAGNOSIS — G894 Chronic pain syndrome: Secondary | ICD-10-CM | POA: Diagnosis not present

## 2017-02-02 DIAGNOSIS — Z125 Encounter for screening for malignant neoplasm of prostate: Secondary | ICD-10-CM | POA: Diagnosis not present

## 2017-02-02 DIAGNOSIS — M1009 Idiopathic gout, multiple sites: Secondary | ICD-10-CM | POA: Diagnosis not present

## 2017-02-02 DIAGNOSIS — Z0000 Encounter for general adult medical examination without abnormal findings: Secondary | ICD-10-CM | POA: Diagnosis not present

## 2017-02-02 DIAGNOSIS — J440 Chronic obstructive pulmonary disease with acute lower respiratory infection: Secondary | ICD-10-CM | POA: Diagnosis not present

## 2017-02-02 DIAGNOSIS — E119 Type 2 diabetes mellitus without complications: Secondary | ICD-10-CM | POA: Diagnosis not present

## 2017-02-02 DIAGNOSIS — Z1389 Encounter for screening for other disorder: Secondary | ICD-10-CM | POA: Diagnosis not present

## 2017-02-09 DIAGNOSIS — M25552 Pain in left hip: Secondary | ICD-10-CM | POA: Diagnosis not present

## 2017-02-09 DIAGNOSIS — M546 Pain in thoracic spine: Secondary | ICD-10-CM | POA: Diagnosis not present

## 2017-02-09 DIAGNOSIS — G894 Chronic pain syndrome: Secondary | ICD-10-CM | POA: Diagnosis not present

## 2017-02-09 DIAGNOSIS — M545 Low back pain: Secondary | ICD-10-CM | POA: Diagnosis not present

## 2017-02-09 DIAGNOSIS — M25551 Pain in right hip: Secondary | ICD-10-CM | POA: Diagnosis not present

## 2017-03-08 ENCOUNTER — Other Ambulatory Visit: Payer: Self-pay | Admitting: Cardiology

## 2017-03-08 DIAGNOSIS — G894 Chronic pain syndrome: Secondary | ICD-10-CM | POA: Diagnosis not present

## 2017-03-08 DIAGNOSIS — G4733 Obstructive sleep apnea (adult) (pediatric): Secondary | ICD-10-CM | POA: Diagnosis not present

## 2017-03-08 DIAGNOSIS — M5417 Radiculopathy, lumbosacral region: Secondary | ICD-10-CM | POA: Diagnosis not present

## 2017-03-08 DIAGNOSIS — M25511 Pain in right shoulder: Secondary | ICD-10-CM | POA: Diagnosis not present

## 2017-03-08 DIAGNOSIS — M25512 Pain in left shoulder: Secondary | ICD-10-CM | POA: Diagnosis not present

## 2017-03-12 ENCOUNTER — Telehealth: Payer: Self-pay | Admitting: Cardiology

## 2017-03-12 NOTE — Telephone Encounter (Signed)
Message fwd to provider for approval.

## 2017-03-12 NOTE — Telephone Encounter (Signed)
Patient would like to know if he can use Nicorette losanges

## 2017-03-12 NOTE — Telephone Encounter (Signed)
Yes he can use nicotine replacement products to help quit smoking.

## 2017-03-12 NOTE — Telephone Encounter (Signed)
Patient notified

## 2017-03-15 ENCOUNTER — Other Ambulatory Visit: Payer: Self-pay | Admitting: Cardiology

## 2017-04-06 ENCOUNTER — Other Ambulatory Visit: Payer: Self-pay | Admitting: Cardiology

## 2017-04-12 ENCOUNTER — Ambulatory Visit: Payer: Medicare Other | Admitting: Cardiology

## 2017-04-19 ENCOUNTER — Encounter: Payer: Self-pay | Admitting: *Deleted

## 2017-04-19 NOTE — Progress Notes (Signed)
Cardiology Office Note  Date: 04/20/2017   ID: [name], DOB 07/12/1966, MRN [id number]  PCP: [name], MD  Primary Cardiologist: [name], MD   Chief Complaint  Patient presents with  . Coronary Artery Disease    History of Present Illness: [name] [name] is a medically complex 51 y.o. male last seen in January. He presents for a routine follow-up visit. Reports stable, infrequent angina symptoms, no increasing nitroglycerin use.  He reports compliance with his medications and no changes in cardiac regimen as outlined below.  He has started Chantix recently and is trying to quit smoking. He seems to be fairly motivated at this time.  I personally reviewed his ECG from today which shows normal sinus rhythm with nonspecific T-wave changes.  Past Medical History:  Diagnosis Date  . Asthma   . Bipolar disorder ([name] [name])   . Chronic back pain   . Coronary atherosclerosis of native coronary artery    a. CABG 2003. b. NSTEMI 2010 s/p BMS to SVG-RCA c. PTCA/stent/asp thromb to SVG-RCA 01/2010, PTCA to SVG-RCA 04/2010 d. known occlusion of SVG-RCA 2013. e. NSTEMI 07/2013  s/p PTCA/DES to SVG-diagonal/intermediate/OM1.   . Essential hypertension   . GERD (gastroesophageal reflux disease)   . Gout   . Ischemic cardiomyopathy    a. EF 35-40% by cath 07/2013.  . Mixed hyperlipidemia   . Sinus bradycardia   . Type 2 diabetes mellitus ([name] [name])     Past Surgical History:  Procedure Laterality Date  . CARDIAC CATHETERIZATION  06/19/2014   Procedure: CORONARY STENT INTERVENTION;  Surgeon: Leonie Man, MD;  Location: [facility] CATH LAB;  Service: Cardiovascular;;  DES to seg SVG Diag/OM/Ramus   . CARDIAC CATHETERIZATION N/A 11/22/2015   Procedure: Left Heart Cath and Coronary Angiography;  Surgeon: Belva Crome, MD;  Location: [place] CV LAB;  Service: Cardiovascular;  Laterality: N/A;  . CHOLECYSTECTOMY    . CORONARY ARTERY BYPASS GRAFT     2003, LIMA to LAD; SVG to  diagonal; SVG to OM, ramus, and diagonal; SVG to RCA  . LEFT HEART CATHETERIZATION WITH CORONARY ANGIOGRAM N/A 01/28/2015   Procedure: LEFT HEART CATHETERIZATION WITH CORONARY ANGIOGRAM;  Surgeon: Leonie Man, MD;  Location: [facility] CATH LAB;  Service: Cardiovascular;  Laterality: N/A;  . LEFT HEART CATHETERIZATION WITH CORONARY/GRAFT ANGIOGRAM N/A 07/14/2013   Procedure: LEFT HEART CATHETERIZATION WITH Beatrix Fetters;  Surgeon: Burnell Blanks, MD;  Location: [facility] CATH LAB;  Service: Cardiovascular;  Laterality: N/A;  . LEFT HEART CATHETERIZATION WITH CORONARY/GRAFT ANGIOGRAM N/A 12/29/2013   Procedure: LEFT HEART CATHETERIZATION WITH Beatrix Fetters;  Surgeon: Leonie Man, MD;  Location: [facility] CATH LAB;  Service: Cardiovascular;  Laterality: N/A;  . LEFT HEART CATHETERIZATION WITH CORONARY/GRAFT ANGIOGRAM N/A 06/19/2014   Procedure: LEFT HEART CATHETERIZATION WITH Beatrix Fetters;  Surgeon: Leonie Man, MD;  Location: [facility] CATH LAB;  Service: Cardiovascular;  Laterality: N/A;  . Metal plate to chin    . PERCUTANEOUS CORONARY STENT INTERVENTION (PCI-S)  07/14/2013   Procedure: PERCUTANEOUS CORONARY STENT INTERVENTION (PCI-S);  Surgeon: Burnell Blanks, MD;  Location: [facility] CATH LAB;  Service: Cardiovascular;;  . Pin placement left leg      Current Outpatient Prescriptions  Medication Sig Dispense Refill  . acetaminophen (TYLENOL) 325 MG tablet Take 2 tablets (650 mg total) by mouth every 4 (four) hours as needed for headache or mild pain.    Marland Kitchen albuterol (PROVENTIL HFA;VENTOLIN HFA) 108 (90 BASE) MCG/ACT inhaler Inhale  2 puffs into the lungs every 6 (six) hours as needed for wheezing or shortness of breath.    . allopurinol (ZYLOPRIM) 300 MG tablet Take 300 mg by mouth daily.  0  . aspirin EC 81 MG tablet Take 1 tablet (81 mg total) by mouth daily.    . Blood Pressure Monitoring KIT 1 each by Does not apply route as directed. 1 kit 0  . carvedilol (COREG) 3.125  MG tablet TAKE ONE TABLET BY MOUTH 2 TIMES A DAY WITH A MEAL 180 tablet 1  . clopidogrel (PLAVIX) 75 MG tablet TAKE ONE TABLET BY MOUTH EVERY DAY 90 tablet 1  . diazepam (VALIUM) 10 MG tablet Take 10 mg by mouth 2 (two) times daily.    . furosemide (LASIX) 20 MG tablet Take 1 tablet (20 mg total) by mouth at bedtime. 90 tablet 3  . HYDROcodone-acetaminophen (NORCO) 7.5-325 MG per tablet Take 1 tablet by mouth every 6 (six) hours as needed for moderate pain.    . isosorbide mononitrate (IMDUR) 60 MG 24 hr tablet Take 1 tablet (60 mg total) by mouth 2 (two) times daily. 180 tablet 3  . lisinopril (PRINIVIL,ZESTRIL) 2.5 MG tablet TAKE ONE TABLET BY MOUTH EVERY DAY 90 tablet 1  . metFORMIN (GLUCOPHAGE) 1000 MG tablet Take 1 tablet (1,000 mg total) by mouth 2 (two) times daily with a meal.    . nitroGLYCERIN (NITROSTAT) 0.4 MG SL tablet PLACE 1 TABLET UNDER THE TONGUE AS NEEDED FOR CHEST PAIN. MAY REPEAT EVERY 5 MINUTES UP TO 3 DOSES. IF NO RELIEF AFTER 3 DOSES, CALL 911 OR 25 tablet 3  . Omega-3 Fatty Acids (FISH OIL) 1000 MG CAPS Take 1,000-2,000 mg by mouth 2 (two) times daily. 1 IN THE MORNING AND 2 AT BEDTIME    . pantoprazole (PROTONIX) 40 MG tablet Take 40 mg by mouth daily.     . pravastatin (PRAVACHOL) 80 MG tablet Take 1 tablet (80 mg total) by mouth every evening. 90 tablet 3  . RANEXA 1000 MG SR tablet TAKE ONE TABLET BY MOUTH 2 TIMES A DAY 180 tablet 3  . Varenicline Tartrate (CHANTIX PO) Take by mouth.     No current facility-administered medications for this visit.    Allergies:  Patient has no known allergies.   Social History: The patient  reports that he has been smoking Cigarettes.  He started smoking about 38 years ago. He has a 26.40 pack-year smoking history. He has never used smokeless tobacco. He reports that he uses drugs. He reports that he does not drink alcohol.   ROS:  Please see the history of present illness. Otherwise, complete review of systems is positive for stable  NYHA class II dyspnea.  All other systems are reviewed and negative.   Physical Exam: VS:  BP 118/72   Pulse 68   Ht 5' 4"  (1.626 m)   Wt 202 lb (91.6 kg)   SpO2 98%   BMI 34.67 kg/m , BMI Body mass index is 34.67 kg/m.  Wt Readings from Last 3 Encounters:  04/20/17 202 lb (91.6 kg)  10/12/16 215 lb (97.5 kg)  04/13/16 208 lb (94.3 kg)    Overweight male no distress.  HEENT: Conjunctiva and lids normal, oropharynx clear with poor dentition.  Neck: Supple, no elevated JVP or carotid bruits, no thyromegaly.  Lungs: Clear to auscultation, nonlabored breathing at rest.  Cardiac: Regular rate and rhythm, no S3, no pericardial rub.  Abdomen: Soft, nontender, bowel sounds present.  Extremities:  No pitting edema, distal pulses 2+.  Skin: Warm and dry. Musculoskeletal: No kyphosis. Neuropsychiatric: Alert rate 3, affect appropriate.  ECG: I personally reviewed the tracing from 12/20/2015 which showed sinus bradycardia, probable old inferior infarct pattern, nonspecific T-wave changes.  Recent Labwork:  11/21/2015: ALT 12; AST 15 11/22/2015: Hemoglobin 12.9; Platelets 189 11/23/2015: BUN 9; Creatinine, Ser 0.84; Magnesium 1.7; Potassium 4.2; Sodium 141   Other Studies Reviewed Today:  Cardiac catheterization 11/22/2015: 1. Ramus lesion, 95% stenosed. 2. Saphenous vein bypass graft disease with total occlusion of the SVG to the distal RCA. The sequential SVG to the diagonal and ramus intermedius is disease with occlusion of the limb to the ramus intermedius. The side-to-side anastomosis on the diagonal remains patent. The stents in the ostium and distal saphenous vein graft sites are widely patent. 3. Patent LIMA to LAD 4. Total occlusion of the native right. The distal right coronary/PDA fills byleft-to-right collaterals from the LAD. Segmental diffuse disease in the left main. Total occlusion of the LAD. High-grade obstruction in the circumflex and ramus intermedius originating  at the left main. 5. Widely patent LIMA to the LAD  When compared to the prior angiographic images, no significant change has occurred. Inferior wall hypokinesis, estimated ejection fraction 45%  Assessment and Plan:  1. Multivessel CAD status post CABG with graft disease, stable by cardiac catheterization in February of last year and being managed medically. He reports infrequent stable angina symptoms on present regimen. ECG reviewed and stable. No changes were made today.  2. Longstanding tobacco abuse. He seems to be more motivated to quit at this point and has started Chantix recently. We have discussed smoking cessation strategies over time.  3. Ischemic cardiomyopathy with LVEF approximately 45%. Weight is down, no evidence of fluid overload. He continues on low-dose Lasix.  4. Hyperlipidemia, on Pravachol. Continues to follow with Dr. Sherrie Sport.  Current medicines were reviewed with the patient today.   Orders Placed This Encounter  Procedures  . EKG 12-Lead    Disposition: Follow-up in 6 months, sooner if needed.  Signed, Satira Sark, MD, [facility] 04/20/2017 8:59 AM    [place] at [facility] [address], [place], [place] [address] Phone: [phone]; Fax: [phone]

## 2017-04-20 ENCOUNTER — Encounter: Payer: Self-pay | Admitting: Cardiology

## 2017-04-20 ENCOUNTER — Ambulatory Visit (INDEPENDENT_AMBULATORY_CARE_PROVIDER_SITE_OTHER): Payer: Medicare Other | Admitting: Cardiology

## 2017-04-20 VITALS — BP 118/72 | HR 68 | Ht 64.0 in | Wt 202.0 lb

## 2017-04-20 DIAGNOSIS — Z720 Tobacco use: Secondary | ICD-10-CM | POA: Diagnosis not present

## 2017-04-20 DIAGNOSIS — I25119 Atherosclerotic heart disease of native coronary artery with unspecified angina pectoris: Secondary | ICD-10-CM | POA: Diagnosis not present

## 2017-04-20 DIAGNOSIS — I255 Ischemic cardiomyopathy: Secondary | ICD-10-CM

## 2017-04-20 DIAGNOSIS — E782 Mixed hyperlipidemia: Secondary | ICD-10-CM

## 2017-04-20 NOTE — Patient Instructions (Addendum)

## 2017-04-26 ENCOUNTER — Other Ambulatory Visit: Payer: Self-pay | Admitting: Cardiology

## 2017-05-09 DIAGNOSIS — J440 Chronic obstructive pulmonary disease with acute lower respiratory infection: Secondary | ICD-10-CM | POA: Diagnosis not present

## 2017-05-09 DIAGNOSIS — E119 Type 2 diabetes mellitus without complications: Secondary | ICD-10-CM | POA: Diagnosis not present

## 2017-05-09 DIAGNOSIS — Z79899 Other long term (current) drug therapy: Secondary | ICD-10-CM | POA: Diagnosis not present

## 2017-05-09 DIAGNOSIS — Z1159 Encounter for screening for other viral diseases: Secondary | ICD-10-CM | POA: Diagnosis not present

## 2017-05-09 DIAGNOSIS — M1009 Idiopathic gout, multiple sites: Secondary | ICD-10-CM | POA: Diagnosis not present

## 2017-05-18 DIAGNOSIS — R0602 Shortness of breath: Secondary | ICD-10-CM | POA: Diagnosis not present

## 2017-05-18 DIAGNOSIS — B9689 Other specified bacterial agents as the cause of diseases classified elsewhere: Secondary | ICD-10-CM | POA: Diagnosis not present

## 2017-05-18 DIAGNOSIS — J440 Chronic obstructive pulmonary disease with acute lower respiratory infection: Secondary | ICD-10-CM | POA: Diagnosis not present

## 2017-05-18 DIAGNOSIS — E119 Type 2 diabetes mellitus without complications: Secondary | ICD-10-CM | POA: Diagnosis not present

## 2017-05-24 DIAGNOSIS — M5136 Other intervertebral disc degeneration, lumbar region: Secondary | ICD-10-CM | POA: Diagnosis not present

## 2017-05-24 DIAGNOSIS — Z79899 Other long term (current) drug therapy: Secondary | ICD-10-CM | POA: Diagnosis not present

## 2017-05-24 DIAGNOSIS — M179 Osteoarthritis of knee, unspecified: Secondary | ICD-10-CM | POA: Diagnosis not present

## 2017-05-24 DIAGNOSIS — G894 Chronic pain syndrome: Secondary | ICD-10-CM | POA: Diagnosis not present

## 2017-05-24 DIAGNOSIS — Z79891 Long term (current) use of opiate analgesic: Secondary | ICD-10-CM | POA: Diagnosis not present

## 2017-05-24 DIAGNOSIS — M47816 Spondylosis without myelopathy or radiculopathy, lumbar region: Secondary | ICD-10-CM | POA: Diagnosis not present

## 2017-05-30 ENCOUNTER — Other Ambulatory Visit: Payer: Self-pay | Admitting: Cardiology

## 2017-06-04 DIAGNOSIS — M25569 Pain in unspecified knee: Secondary | ICD-10-CM | POA: Diagnosis not present

## 2017-06-04 DIAGNOSIS — Z9889 Other specified postprocedural states: Secondary | ICD-10-CM | POA: Diagnosis not present

## 2017-06-04 DIAGNOSIS — M25562 Pain in left knee: Secondary | ICD-10-CM | POA: Diagnosis not present

## 2017-06-12 DIAGNOSIS — M545 Low back pain: Secondary | ICD-10-CM | POA: Diagnosis not present

## 2017-06-12 DIAGNOSIS — M79609 Pain in unspecified limb: Secondary | ICD-10-CM | POA: Diagnosis not present

## 2017-06-21 ENCOUNTER — Other Ambulatory Visit: Payer: Self-pay | Admitting: Pain Medicine

## 2017-06-21 DIAGNOSIS — M79604 Pain in right leg: Secondary | ICD-10-CM

## 2017-06-21 DIAGNOSIS — M79605 Pain in left leg: Secondary | ICD-10-CM

## 2017-06-21 DIAGNOSIS — G894 Chronic pain syndrome: Secondary | ICD-10-CM | POA: Diagnosis not present

## 2017-06-21 DIAGNOSIS — M25569 Pain in unspecified knee: Secondary | ICD-10-CM | POA: Diagnosis not present

## 2017-06-21 DIAGNOSIS — M545 Low back pain: Secondary | ICD-10-CM

## 2017-06-21 DIAGNOSIS — M5136 Other intervertebral disc degeneration, lumbar region: Secondary | ICD-10-CM | POA: Diagnosis not present

## 2017-06-26 ENCOUNTER — Other Ambulatory Visit: Payer: Self-pay | Admitting: Cardiology

## 2017-07-03 DIAGNOSIS — G894 Chronic pain syndrome: Secondary | ICD-10-CM | POA: Diagnosis not present

## 2017-07-03 DIAGNOSIS — M792 Neuralgia and neuritis, unspecified: Secondary | ICD-10-CM | POA: Diagnosis not present

## 2017-07-04 ENCOUNTER — Ambulatory Visit
Admission: RE | Admit: 2017-07-04 | Discharge: 2017-07-04 | Disposition: A | Discharge status: Home / Self Care | Payer: Medicare Other | Source: Ambulatory Visit | Attending: Pain Medicine | Admitting: Pain Medicine

## 2017-07-04 DIAGNOSIS — M79604 Pain in right leg: Secondary | ICD-10-CM

## 2017-07-04 DIAGNOSIS — M545 Low back pain: Secondary | ICD-10-CM

## 2017-07-04 DIAGNOSIS — M5126 Other intervertebral disc displacement, lumbar region: Secondary | ICD-10-CM | POA: Diagnosis not present

## 2017-07-04 DIAGNOSIS — M79605 Pain in left leg: Secondary | ICD-10-CM

## 2017-07-16 ENCOUNTER — Other Ambulatory Visit: Payer: Self-pay | Admitting: Cardiology

## 2017-07-19 DIAGNOSIS — Z79899 Other long term (current) drug therapy: Secondary | ICD-10-CM | POA: Diagnosis not present

## 2017-07-19 DIAGNOSIS — G894 Chronic pain syndrome: Secondary | ICD-10-CM | POA: Diagnosis not present

## 2017-07-19 DIAGNOSIS — M79604 Pain in right leg: Secondary | ICD-10-CM | POA: Diagnosis not present

## 2017-07-19 DIAGNOSIS — M5136 Other intervertebral disc degeneration, lumbar region: Secondary | ICD-10-CM | POA: Diagnosis not present

## 2017-07-19 DIAGNOSIS — M47816 Spondylosis without myelopathy or radiculopathy, lumbar region: Secondary | ICD-10-CM | POA: Diagnosis not present

## 2017-07-19 DIAGNOSIS — M25569 Pain in unspecified knee: Secondary | ICD-10-CM | POA: Diagnosis not present

## 2017-07-19 DIAGNOSIS — Z79891 Long term (current) use of opiate analgesic: Secondary | ICD-10-CM | POA: Diagnosis not present

## 2017-08-13 DIAGNOSIS — M1009 Idiopathic gout, multiple sites: Secondary | ICD-10-CM | POA: Diagnosis not present

## 2017-08-13 DIAGNOSIS — J440 Chronic obstructive pulmonary disease with acute lower respiratory infection: Secondary | ICD-10-CM | POA: Diagnosis not present

## 2017-08-13 DIAGNOSIS — E119 Type 2 diabetes mellitus without complications: Secondary | ICD-10-CM | POA: Diagnosis not present

## 2017-08-13 DIAGNOSIS — K210 Gastro-esophageal reflux disease with esophagitis: Secondary | ICD-10-CM | POA: Diagnosis not present

## 2017-08-15 DIAGNOSIS — M5136 Other intervertebral disc degeneration, lumbar region: Secondary | ICD-10-CM | POA: Diagnosis not present

## 2017-08-15 DIAGNOSIS — Z79899 Other long term (current) drug therapy: Secondary | ICD-10-CM | POA: Diagnosis not present

## 2017-08-15 DIAGNOSIS — M47816 Spondylosis without myelopathy or radiculopathy, lumbar region: Secondary | ICD-10-CM | POA: Diagnosis not present

## 2017-08-15 DIAGNOSIS — M25569 Pain in unspecified knee: Secondary | ICD-10-CM | POA: Diagnosis not present

## 2017-08-15 DIAGNOSIS — G894 Chronic pain syndrome: Secondary | ICD-10-CM | POA: Diagnosis not present

## 2017-08-15 DIAGNOSIS — Z79891 Long term (current) use of opiate analgesic: Secondary | ICD-10-CM | POA: Diagnosis not present

## 2017-08-20 ENCOUNTER — Other Ambulatory Visit: Payer: Self-pay | Admitting: Cardiology

## 2017-08-20 DIAGNOSIS — Z79899 Other long term (current) drug therapy: Secondary | ICD-10-CM | POA: Diagnosis not present

## 2017-08-20 DIAGNOSIS — G894 Chronic pain syndrome: Secondary | ICD-10-CM | POA: Diagnosis not present

## 2017-08-20 DIAGNOSIS — Z79891 Long term (current) use of opiate analgesic: Secondary | ICD-10-CM | POA: Diagnosis not present

## 2017-09-03 DIAGNOSIS — M25569 Pain in unspecified knee: Secondary | ICD-10-CM | POA: Diagnosis not present

## 2017-09-03 DIAGNOSIS — G894 Chronic pain syndrome: Secondary | ICD-10-CM | POA: Diagnosis not present

## 2017-09-03 DIAGNOSIS — M79604 Pain in right leg: Secondary | ICD-10-CM | POA: Diagnosis not present

## 2017-09-03 DIAGNOSIS — M5136 Other intervertebral disc degeneration, lumbar region: Secondary | ICD-10-CM | POA: Diagnosis not present

## 2017-09-03 DIAGNOSIS — Z79899 Other long term (current) drug therapy: Secondary | ICD-10-CM | POA: Diagnosis not present

## 2017-09-03 DIAGNOSIS — Z79891 Long term (current) use of opiate analgesic: Secondary | ICD-10-CM | POA: Diagnosis not present

## 2017-09-20 DIAGNOSIS — Z79891 Long term (current) use of opiate analgesic: Secondary | ICD-10-CM | POA: Diagnosis not present

## 2017-09-20 DIAGNOSIS — G894 Chronic pain syndrome: Secondary | ICD-10-CM | POA: Diagnosis not present

## 2017-09-20 DIAGNOSIS — M25569 Pain in unspecified knee: Secondary | ICD-10-CM | POA: Diagnosis not present

## 2017-09-20 DIAGNOSIS — Z79899 Other long term (current) drug therapy: Secondary | ICD-10-CM | POA: Diagnosis not present

## 2017-09-20 DIAGNOSIS — M5136 Other intervertebral disc degeneration, lumbar region: Secondary | ICD-10-CM | POA: Diagnosis not present

## 2017-09-20 DIAGNOSIS — M79604 Pain in right leg: Secondary | ICD-10-CM | POA: Diagnosis not present

## 2017-09-28 DIAGNOSIS — M47816 Spondylosis without myelopathy or radiculopathy, lumbar region: Secondary | ICD-10-CM | POA: Diagnosis not present

## 2017-09-28 DIAGNOSIS — M4317 Spondylolisthesis, lumbosacral region: Secondary | ICD-10-CM | POA: Diagnosis not present

## 2017-09-28 DIAGNOSIS — M5136 Other intervertebral disc degeneration, lumbar region: Secondary | ICD-10-CM | POA: Diagnosis not present

## 2017-09-28 DIAGNOSIS — M4307 Spondylolysis, lumbosacral region: Secondary | ICD-10-CM | POA: Diagnosis not present

## 2017-10-15 ENCOUNTER — Other Ambulatory Visit: Payer: Self-pay | Admitting: Cardiology

## 2017-10-18 DIAGNOSIS — Z79891 Long term (current) use of opiate analgesic: Secondary | ICD-10-CM | POA: Diagnosis not present

## 2017-10-18 DIAGNOSIS — M79604 Pain in right leg: Secondary | ICD-10-CM | POA: Diagnosis not present

## 2017-10-18 DIAGNOSIS — Z79899 Other long term (current) drug therapy: Secondary | ICD-10-CM | POA: Diagnosis not present

## 2017-10-18 DIAGNOSIS — M47816 Spondylosis without myelopathy or radiculopathy, lumbar region: Secondary | ICD-10-CM | POA: Diagnosis not present

## 2017-10-18 DIAGNOSIS — G894 Chronic pain syndrome: Secondary | ICD-10-CM | POA: Diagnosis not present

## 2017-10-18 DIAGNOSIS — M25569 Pain in unspecified knee: Secondary | ICD-10-CM | POA: Diagnosis not present

## 2017-10-19 ENCOUNTER — Encounter: Payer: Self-pay | Admitting: *Deleted

## 2017-10-19 NOTE — Progress Notes (Deleted)
Cardiology Office Note  Date: 10/19/2017   ID: GALVIN AVERSA, DOB 05-Sep-1966, MRN [id number]  PCP: [name], MD  Primary Cardiologist: [name], MD   No chief complaint on file.   History of Present Illness: [name] [name] is a medically complex 52 y.o. male last seen in July 2018.  Past Medical History:  Diagnosis Date  . Asthma   . Bipolar disorder ([place])   . Chronic back pain   . Coronary atherosclerosis of native coronary artery    a. CABG 2003. b. NSTEMI 2010 s/p BMS to SVG-RCA c. PTCA/stent/asp thromb to SVG-RCA 01/2010, PTCA to SVG-RCA 04/2010 d. known occlusion of SVG-RCA 2013. e. NSTEMI 07/2013  s/p PTCA/DES to SVG-diagonal/intermediate/OM1.   . Essential hypertension   . GERD (gastroesophageal reflux disease)   . Gout   . Ischemic cardiomyopathy    a. EF 35-40% by cath 07/2013.  . Mixed hyperlipidemia   . Sinus bradycardia   . Type 2 diabetes mellitus ([place])     Past Surgical History:  Procedure Laterality Date  . CARDIAC CATHETERIZATION  06/19/2014   Procedure: CORONARY STENT INTERVENTION;  Surgeon: Leonie Man, MD;  Location: [facility] CATH LAB;  Service: Cardiovascular;;  DES to seg SVG Diag/OM/Ramus   . CARDIAC CATHETERIZATION N/A 11/22/2015   Procedure: Left Heart Cath and Coronary Angiography;  Surgeon: Belva Crome, MD;  Location: [place] CV LAB;  Service: Cardiovascular;  Laterality: N/A;  . CHOLECYSTECTOMY    . CORONARY ARTERY BYPASS GRAFT     2003, LIMA to LAD; SVG to diagonal; SVG to OM, ramus, and diagonal; SVG to RCA  . LEFT HEART CATHETERIZATION WITH CORONARY ANGIOGRAM N/A 01/28/2015   Procedure: LEFT HEART CATHETERIZATION WITH CORONARY ANGIOGRAM;  Surgeon: Leonie Man, MD;  Location: [facility] CATH LAB;  Service: Cardiovascular;  Laterality: N/A;  . LEFT HEART CATHETERIZATION WITH CORONARY/GRAFT ANGIOGRAM N/A 07/14/2013   Procedure: LEFT HEART CATHETERIZATION WITH Beatrix Fetters;  Surgeon: Burnell Blanks, MD;   Location: [facility] CATH LAB;  Service: Cardiovascular;  Laterality: N/A;  . LEFT HEART CATHETERIZATION WITH CORONARY/GRAFT ANGIOGRAM N/A 12/29/2013   Procedure: LEFT HEART CATHETERIZATION WITH Beatrix Fetters;  Surgeon: Leonie Man, MD;  Location: [facility] CATH LAB;  Service: Cardiovascular;  Laterality: N/A;  . LEFT HEART CATHETERIZATION WITH CORONARY/GRAFT ANGIOGRAM N/A 06/19/2014   Procedure: LEFT HEART CATHETERIZATION WITH Beatrix Fetters;  Surgeon: Leonie Man, MD;  Location: [facility] CATH LAB;  Service: Cardiovascular;  Laterality: N/A;  . Metal plate to chin    . PERCUTANEOUS CORONARY STENT INTERVENTION (PCI-S)  07/14/2013   Procedure: PERCUTANEOUS CORONARY STENT INTERVENTION (PCI-S);  Surgeon: Burnell Blanks, MD;  Location: [facility] CATH LAB;  Service: Cardiovascular;;  . Pin placement left leg      Current Outpatient Medications  Medication Sig Dispense Refill  . acetaminophen (TYLENOL) 325 MG tablet Take 2 tablets (650 mg total) by mouth every 4 (four) hours as needed for headache or mild pain.    Marland Kitchen albuterol (PROVENTIL HFA;VENTOLIN HFA) 108 (90 BASE) MCG/ACT inhaler Inhale 2 puffs into the lungs every 6 (six) hours as needed for wheezing or shortness of breath.    . allopurinol (ZYLOPRIM) 300 MG tablet Take 300 mg by mouth daily.  0  . aspirin EC 81 MG tablet Take 1 tablet (81 mg total) by mouth daily.    . Blood Pressure Monitoring KIT 1 each by Does not apply route as directed. 1 kit 0  . carvedilol (COREG) 3.125 MG  tablet TAKE ONE TABLET BY MOUTH 2 TIMES A DAY WITH A MEAL 180 tablet 3  . clopidogrel (PLAVIX) 75 MG tablet TAKE ONE TABLET BY MOUTH EVERY DAY 90 tablet 1  . diazepam (VALIUM) 10 MG tablet Take 10 mg by mouth 2 (two) times daily.    . furosemide (LASIX) 20 MG tablet TAKE ONE TABLET BY MOUTH EVERY NIGHT AT BEDTIME 90 tablet 3  . HYDROcodone-acetaminophen (NORCO) 7.5-325 MG per tablet Take 1 tablet by mouth every 6 (six) hours as needed for moderate pain.    .  isosorbide mononitrate (IMDUR) 60 MG 24 hr tablet TAKE ONE TABLET BY MOUTH 2 TIMES A DAY 180 tablet 0  . lisinopril (PRINIVIL,ZESTRIL) 2.5 MG tablet TAKE ONE TABLET BY MOUTH EVERY DAY 90 tablet 3  . metFORMIN (GLUCOPHAGE) 1000 MG tablet Take 1 tablet (1,000 mg total) by mouth 2 (two) times daily with a meal.    . NITROSTAT 0.4 MG SL tablet PLACE 1 TABLET UNDER THE TONGUE AS NEEDED FOR CHEST PAIN. MAY REPEAT EVERY 5 MINUTES UP TO 3 DOSES. IF NO RELIEF AFTER 3 DOSES, CALL 911 OR 25 tablet 3  . Omega-3 Fatty Acids (FISH OIL) 1000 MG CAPS Take 1,000-2,000 mg by mouth 2 (two) times daily. 1 IN THE MORNING AND 2 AT BEDTIME    . pantoprazole (PROTONIX) 40 MG tablet Take 40 mg by mouth daily.     . pravastatin (PRAVACHOL) 80 MG tablet TAKE ONE TABLET BY MOUTH EVERY EVENING 90 tablet 0  . RANEXA 1000 MG SR tablet TAKE ONE TABLET BY MOUTH 2 TIMES A DAY 180 tablet 3  . Varenicline Tartrate (CHANTIX PO) Take by mouth.     No current facility-administered medications for this visit.    Allergies:  Patient has no known allergies.   Social History: The patient  reports that he has been smoking cigarettes.  He started smoking about 39 years ago. He has a 26.40 pack-year smoking history. he has never used smokeless tobacco. He reports that he uses drugs. He reports that he does not drink alcohol.   Family History: The patient's family history includes Stroke in his unknown relative.   ROS:  Please see the history of present illness. Otherwise, complete review of systems is positive for {NONE DEFAULTED:18576::"none"}.  All other systems are reviewed and negative.   Physical Exam: VS:  There were no vitals taken for this visit., BMI There is no height or weight on file to calculate BMI.  Wt Readings from Last 3 Encounters:  04/20/17 202 lb (91.6 kg)  10/12/16 215 lb (97.5 kg)  04/13/16 208 lb (94.3 kg)    General: Patient appears comfortable at rest. HEENT: Conjunctiva and lids normal, oropharynx clear  with moist mucosa. Neck: Supple, no elevated JVP or carotid bruits, no thyromegaly. Lungs: Clear to auscultation, nonlabored breathing at rest. Cardiac: Regular rate and rhythm, no S3 or significant systolic murmur, no pericardial rub. Abdomen: Soft, nontender, no hepatomegaly, bowel sounds present, no guarding or rebound. Extremities: No pitting edema, distal pulses 2+. Skin: Warm and dry. Musculoskeletal: No kyphosis. Neuropsychiatric: Alert and oriented x3, affect grossly appropriate.  ECG: I personally reviewed the tracing from 04/20/2017 showed sinus rhythm with nonspecific T-wave changes.  Recent Labwork:  11/21/2015: ALT 12; AST 15 11/22/2015: Hemoglobin 12.9; Platelets 189 11/23/2015: BUN 9; Creatinine, Ser 0.84; Magnesium 1.7; Potassium 4.2; Sodium 141  Other Studies Reviewed Today:  Cardiac catheterization 11/22/2015: 1. Ramus lesion, 95% stenosed. 2. Saphenous vein bypass graft disease with  total occlusion of the SVG to the distal RCA. The sequential SVG to the diagonal and ramus intermedius is disease with occlusion of the limb to the ramus intermedius. The side-to-side anastomosis on the diagonal remains patent. The stents in the ostium and distal saphenous vein graft sites are widely patent. 3. Patent LIMA to LAD 4. Total occlusion of the native right. The distal right coronary/PDA fills byleft-to-right collaterals from the LAD. Segmental diffuse disease in the left main. Total occlusion of the LAD. High-grade obstruction in the circumflex and ramus intermedius originating at the left main. 5. Widely patent LIMA to the LAD  When compared to the prior angiographic images, no significant change has occurred. Inferior wall hypokinesis, estimated ejection fraction 45%  Assessment and Plan:    Current medicines were reviewed with the patient today.  No orders of the defined types were placed in this encounter.   Disposition:  Signed, Satira Sark, MD,  [facility] 10/19/2017 4:53 PM    [place] at [place], [place], [place] [address] Phone: [phone]; Fax: [phone]

## 2017-10-22 ENCOUNTER — Ambulatory Visit: Payer: Medicare Other | Admitting: Cardiology

## 2017-10-30 DIAGNOSIS — Z79891 Long term (current) use of opiate analgesic: Secondary | ICD-10-CM | POA: Diagnosis not present

## 2017-10-30 DIAGNOSIS — Z79899 Other long term (current) drug therapy: Secondary | ICD-10-CM | POA: Diagnosis not present

## 2017-10-30 DIAGNOSIS — G894 Chronic pain syndrome: Secondary | ICD-10-CM | POA: Diagnosis not present

## 2017-10-31 DIAGNOSIS — M4307 Spondylolysis, lumbosacral region: Secondary | ICD-10-CM | POA: Diagnosis not present

## 2017-10-31 DIAGNOSIS — M5136 Other intervertebral disc degeneration, lumbar region: Secondary | ICD-10-CM | POA: Diagnosis not present

## 2017-10-31 DIAGNOSIS — M47816 Spondylosis without myelopathy or radiculopathy, lumbar region: Secondary | ICD-10-CM | POA: Diagnosis not present

## 2017-11-15 DIAGNOSIS — M5136 Other intervertebral disc degeneration, lumbar region: Secondary | ICD-10-CM | POA: Diagnosis not present

## 2017-11-15 DIAGNOSIS — M25569 Pain in unspecified knee: Secondary | ICD-10-CM | POA: Diagnosis not present

## 2017-11-15 DIAGNOSIS — Z79899 Other long term (current) drug therapy: Secondary | ICD-10-CM | POA: Diagnosis not present

## 2017-11-15 DIAGNOSIS — M47816 Spondylosis without myelopathy or radiculopathy, lumbar region: Secondary | ICD-10-CM | POA: Diagnosis not present

## 2017-11-15 DIAGNOSIS — G894 Chronic pain syndrome: Secondary | ICD-10-CM | POA: Diagnosis not present

## 2017-11-15 DIAGNOSIS — Z79891 Long term (current) use of opiate analgesic: Secondary | ICD-10-CM | POA: Diagnosis not present

## 2017-11-19 DIAGNOSIS — I10 Essential (primary) hypertension: Secondary | ICD-10-CM | POA: Diagnosis not present

## 2017-11-19 DIAGNOSIS — K210 Gastro-esophageal reflux disease with esophagitis: Secondary | ICD-10-CM | POA: Diagnosis not present

## 2017-11-19 DIAGNOSIS — E119 Type 2 diabetes mellitus without complications: Secondary | ICD-10-CM | POA: Diagnosis not present

## 2017-11-19 DIAGNOSIS — E782 Mixed hyperlipidemia: Secondary | ICD-10-CM | POA: Diagnosis not present

## 2017-11-19 DIAGNOSIS — J440 Chronic obstructive pulmonary disease with acute lower respiratory infection: Secondary | ICD-10-CM | POA: Diagnosis not present

## 2017-11-19 DIAGNOSIS — J441 Chronic obstructive pulmonary disease with (acute) exacerbation: Secondary | ICD-10-CM | POA: Diagnosis not present

## 2017-11-21 ENCOUNTER — Other Ambulatory Visit: Payer: Self-pay | Admitting: Cardiology

## 2017-11-22 NOTE — Progress Notes (Signed)
Cardiology Office Note  Date: 11/23/2017   ID: [name] [name], DOB October 12, 1965, MRN [id number]  PCP: [name], MD  Primary Cardiologist: [name], MD   Chief Complaint  Patient presents with  . Coronary Artery Disease    History of Present Illness: [name] [name] is a medically complex 52 y.o. male last seen in July 2018.  He presents today for a routine follow-up visit.  He states that his angina has been well controlled on current cardiac regimen.  He has still not quit smoking but is still interested in trying, unfortunately has had a lot of marital problems that have increased his stress level over the last 6 months.  Recently, he indicates that things are on a better path.  He has also been able to lose some weight.  I went over his cardiac medications.  He is on aspirin, Coreg, Plavix, Lasix, Imdur, lisinopril, Ranexa, Pravachol, and as needed nitroglycerin.  He states that he had recent lab work with Dr. Sherrie Sport which we are requesting for review.  Past Medical History:  Diagnosis Date  . Asthma   . Bipolar disorder ([place])   . Chronic back pain   . Coronary atherosclerosis of native coronary artery    a. CABG 2003. b. NSTEMI 2010 s/p BMS to SVG-RCA c. PTCA/stent/asp thromb to SVG-RCA 01/2010, PTCA to SVG-RCA 04/2010 d. known occlusion of SVG-RCA 2013. e. NSTEMI 07/2013  s/p PTCA/DES to SVG-diagonal/intermediate/OM1.   . Essential hypertension   . GERD (gastroesophageal reflux disease)   . Gout   . Ischemic cardiomyopathy    a. EF 35-40% by cath 07/2013.  . Mixed hyperlipidemia   . Sinus bradycardia   . Type 2 diabetes mellitus ([place])     Past Surgical History:  Procedure Laterality Date  . CARDIAC CATHETERIZATION  06/19/2014   Procedure: CORONARY STENT INTERVENTION;  Surgeon: Leonie Man, MD;  Location: [facility] CATH LAB;  Service: Cardiovascular;;  DES to seg SVG Diag/OM/Ramus   . CARDIAC CATHETERIZATION N/A 11/22/2015   Procedure: Left Heart Cath and  Coronary Angiography;  Surgeon: Belva Crome, MD;  Location: [place] CV LAB;  Service: Cardiovascular;  Laterality: N/A;  . CHOLECYSTECTOMY    . CORONARY ARTERY BYPASS GRAFT     2003, LIMA to LAD; SVG to diagonal; SVG to OM, ramus, and diagonal; SVG to RCA  . LEFT HEART CATHETERIZATION WITH CORONARY ANGIOGRAM N/A 01/28/2015   Procedure: LEFT HEART CATHETERIZATION WITH CORONARY ANGIOGRAM;  Surgeon: Leonie Man, MD;  Location: [facility] CATH LAB;  Service: Cardiovascular;  Laterality: N/A;  . LEFT HEART CATHETERIZATION WITH CORONARY/GRAFT ANGIOGRAM N/A 07/14/2013   Procedure: LEFT HEART CATHETERIZATION WITH Beatrix Fetters;  Surgeon: Burnell Blanks, MD;  Location: [facility] CATH LAB;  Service: Cardiovascular;  Laterality: N/A;  . LEFT HEART CATHETERIZATION WITH CORONARY/GRAFT ANGIOGRAM N/A 12/29/2013   Procedure: LEFT HEART CATHETERIZATION WITH Beatrix Fetters;  Surgeon: Leonie Man, MD;  Location: [facility] CATH LAB;  Service: Cardiovascular;  Laterality: N/A;  . LEFT HEART CATHETERIZATION WITH CORONARY/GRAFT ANGIOGRAM N/A 06/19/2014   Procedure: LEFT HEART CATHETERIZATION WITH Beatrix Fetters;  Surgeon: Leonie Man, MD;  Location: [facility] CATH LAB;  Service: Cardiovascular;  Laterality: N/A;  . Metal plate to chin    . PERCUTANEOUS CORONARY STENT INTERVENTION (PCI-S)  07/14/2013   Procedure: PERCUTANEOUS CORONARY STENT INTERVENTION (PCI-S);  Surgeon: Burnell Blanks, MD;  Location: [facility] CATH LAB;  Service: Cardiovascular;;  . Pin placement left leg  Current Outpatient Medications  Medication Sig Dispense Refill  . acetaminophen (TYLENOL) 325 MG tablet Take 2 tablets (650 mg total) by mouth every 4 (four) hours as needed for headache or mild pain.    Marland Kitchen albuterol (PROVENTIL HFA;VENTOLIN HFA) 108 (90 BASE) MCG/ACT inhaler Inhale 2 puffs into the lungs every 6 (six) hours as needed for wheezing or shortness of breath.    . allopurinol (ZYLOPRIM) 300 MG tablet Take 300  mg by mouth daily.  0  . aspirin EC 81 MG tablet Take 1 tablet (81 mg total) by mouth daily.    . Blood Pressure Monitoring KIT 1 each by Does not apply route as directed. 1 kit 0  . carvedilol (COREG) 3.125 MG tablet TAKE ONE TABLET BY MOUTH 2 TIMES A DAY WITH A MEAL 180 tablet 3  . clopidogrel (PLAVIX) 75 MG tablet TAKE ONE TABLET BY MOUTH EVERY DAY 90 tablet 1  . diazepam (VALIUM) 10 MG tablet Take 10 mg by mouth 2 (two) times daily.    Marland Kitchen FLOVENT HFA 220 MCG/ACT inhaler     . furosemide (LASIX) 20 MG tablet TAKE ONE TABLET BY MOUTH EVERY NIGHT AT BEDTIME 90 tablet 3  . HYDROcodone-acetaminophen (NORCO) 7.5-325 MG per tablet Take 1 tablet by mouth every 6 (six) hours as needed for moderate pain.    Marland Kitchen HYSINGLA ER 30 MG T24A Take 1 tablet by mouth daily.  0  . isosorbide mononitrate (IMDUR) 60 MG 24 hr tablet TAKE ONE TABLET BY MOUTH 2 TIMES A DAY 180 tablet 0  . lisinopril (PRINIVIL,ZESTRIL) 2.5 MG tablet TAKE ONE TABLET BY MOUTH EVERY DAY 90 tablet 3  . metFORMIN (GLUCOPHAGE) 1000 MG tablet Take 1 tablet (1,000 mg total) by mouth 2 (two) times daily with a meal.    . NITROSTAT 0.4 MG SL tablet PLACE 1 TABLET UNDER THE TONGUE AS NEEDED FOR CHEST PAIN. MAY REPEAT EVERY 5 MINUTES UP TO 3 DOSES. IF NO RELIEF AFTER 3 DOSES, CALL 911 OR 25 tablet 3  . Omega-3 Fatty Acids (FISH OIL) 1000 MG CAPS Take 1,000-2,000 mg by mouth 2 (two) times daily. 1 IN THE MORNING AND 2 AT BEDTIME    . ONETOUCH VERIO test strip     . pantoprazole (PROTONIX) 40 MG tablet Take 40 mg by mouth daily.     . pravastatin (PRAVACHOL) 80 MG tablet TAKE ONE TABLET BY MOUTH EVERY EVENING 90 tablet 0  . RANEXA 1000 MG SR tablet TAKE ONE TABLET BY MOUTH 2 TIMES A DAY 180 tablet 3  . Varenicline Tartrate (CHANTIX PO) Take by mouth.     No current facility-administered medications for this visit.    Allergies:  Patient has no known allergies.   Social History: The patient  reports that he has been smoking cigarettes.  He started  smoking about 39 years ago. He has a 26.40 pack-year smoking history. he has never used smokeless tobacco. He reports that he uses drugs. He reports that he does not drink alcohol.   ROS:  Please see the history of present illness. Otherwise, complete review of systems is positive for chronic lower back pain and leg pain.  All other systems are reviewed and negative.   Physical Exam: VS:  BP 112/74   Pulse 78   Ht _0  (1.626 m)   Wt 198 lb (89.8 kg)   SpO2 96%   BMI 33.99 kg/m , BMI Body mass index is 33.99 kg/m.  Wt Readings from Last 3 Encounters:  11/23/17 198 lb (89.8 kg)  04/20/17 202 lb (91.6 kg)  10/12/16 215 lb (97.5 kg)    General: Overweight chronically ill-appearing male, no distress. HEENT: Conjunctiva and lids normal, oropharynx clear. Neck: Supple, no elevated JVP or carotid bruits, no thyromegaly. Lungs: Clear to auscultation, nonlabored breathing at rest. Cardiac: Regular rate and rhythm, no S3 or significant systolic murmur, no pericardial rub. Abdomen: Soft, nontender, bowel sounds present. Extremities: No pitting edema, distal pulses 2+. Skin: Warm and dry. Musculoskeletal: No kyphosis. Neuropsychiatric: Alert and oriented x3, affect grossly appropriate.  ECG: I personally reviewed the tracing from 04/20/2017 which showed sinus rhythm with nonspecific T wave changes.  Recent Labwork:  11/21/2015: ALT 12; AST 15 11/22/2015: Hemoglobin 12.9; Platelets 189 11/23/2015: BUN 9; Creatinine, Ser 0.84; Magnesium 1.7; Potassium 4.2; Sodium 141  Other Studies Reviewed Today:  Cardiac catheterization 11/22/2015: 1. Ramus lesion, 95% stenosed. 2. Saphenous vein bypass graft disease with total occlusion of the SVG to the distal RCA. The sequential SVG to the diagonal and ramus intermedius is disease with occlusion of the limb to the ramus intermedius. The side-to-side anastomosis on the diagonal remains patent. The stents in the ostium and distal saphenous vein graft  sites are widely patent. 3. Patent LIMA to LAD 4. Total occlusion of the native right. The distal right coronary/PDA fills byleft-to-right collaterals from the LAD. Segmental diffuse disease in the left main. Total occlusion of the LAD. High-grade obstruction in the circumflex and ramus intermedius originating at the left main. 5. Widely patent LIMA to the LAD  When compared to the prior angiographic images, no significant change has occurred. Inferior wall hypokinesis, estimated ejection fraction 45%  Assessment and Plan:  1.  Multivessel CAD status post CABG with graft disease and subsequent PCI with plan for continued medical therapy at this point.  Fortunately, he has been symptomatically stable without progressive angina symptoms.  2.  Long-standing tobacco abuse.  He is still motivated to quit.  Tried Chantix but was not able to completely stop smoking.  3.  Ischemic cardiomyopathy with LVEF 45%.  Continue with medical therapy.  4.  Hyperlipidemia on Pravachol.  Requesting recent lab work from PCP.  Current medicines were reviewed with the patient today.  Disposition: Follow-up in 6 months.  Signed, Satira Sark, MD, [facility] 11/23/2017 11:54 AM    [facility] [place], [place], [place] [address] Phone: [phone]; Fax: [phone]

## 2017-11-23 ENCOUNTER — Encounter: Payer: Self-pay | Admitting: *Deleted

## 2017-11-23 ENCOUNTER — Encounter: Payer: Self-pay | Admitting: Cardiology

## 2017-11-23 ENCOUNTER — Ambulatory Visit (INDEPENDENT_AMBULATORY_CARE_PROVIDER_SITE_OTHER): Payer: Medicare Other | Admitting: Cardiology

## 2017-11-23 VITALS — BP 112/74 | HR 78 | Ht 64.0 in | Wt 198.0 lb

## 2017-11-23 DIAGNOSIS — I25119 Atherosclerotic heart disease of native coronary artery with unspecified angina pectoris: Secondary | ICD-10-CM | POA: Diagnosis not present

## 2017-11-23 DIAGNOSIS — E782 Mixed hyperlipidemia: Secondary | ICD-10-CM | POA: Diagnosis not present

## 2017-11-23 DIAGNOSIS — I255 Ischemic cardiomyopathy: Secondary | ICD-10-CM

## 2017-11-23 DIAGNOSIS — Z720 Tobacco use: Secondary | ICD-10-CM | POA: Diagnosis not present

## 2017-11-23 NOTE — Patient Instructions (Signed)

## 2017-11-27 ENCOUNTER — Other Ambulatory Visit: Payer: Self-pay | Admitting: Cardiology

## 2017-11-27 ENCOUNTER — Other Ambulatory Visit: Payer: Self-pay

## 2017-11-27 MED ORDER — CLOPIDOGREL BISULFATE 75 MG PO TABS: 75.0000 mg | ORAL_TABLET | Freq: Every day | ORAL | 2 refills | Status: DC

## 2017-11-28 DIAGNOSIS — M47816 Spondylosis without myelopathy or radiculopathy, lumbar region: Secondary | ICD-10-CM | POA: Diagnosis not present

## 2017-11-28 DIAGNOSIS — G894 Chronic pain syndrome: Secondary | ICD-10-CM | POA: Diagnosis not present

## 2017-11-28 DIAGNOSIS — M4307 Spondylolysis, lumbosacral region: Secondary | ICD-10-CM | POA: Diagnosis not present

## 2017-11-28 DIAGNOSIS — M5136 Other intervertebral disc degeneration, lumbar region: Secondary | ICD-10-CM | POA: Diagnosis not present

## 2017-12-06 DIAGNOSIS — J40 Bronchitis, not specified as acute or chronic: Secondary | ICD-10-CM | POA: Diagnosis not present

## 2017-12-11 ENCOUNTER — Telehealth: Payer: Self-pay | Admitting: Cardiology

## 2017-12-11 NOTE — Telephone Encounter (Signed)
Left message to return call 

## 2017-12-11 NOTE — Telephone Encounter (Signed)
No specific changes recommended at this time.

## 2017-12-11 NOTE — Telephone Encounter (Signed)
Pt says he received a letter from pharmacy saying that metformin could cause CHF and wanted to make Dr Diona Browner aware of this. Pt has been on metformin for several years and being followed by [name]. Pt aware that would FYI Dr Diona Browner but that would probably be no changes and that he could also speak with pharmacist about letter as well.

## 2017-12-11 NOTE — Telephone Encounter (Signed)
Patient called stating that he received a letter from his pharmacy in regards to his metFORMIN (GLUCOPHAGE) 1000 MG tablet   and CHF.

## 2017-12-13 DIAGNOSIS — M25569 Pain in unspecified knee: Secondary | ICD-10-CM | POA: Diagnosis not present

## 2017-12-13 DIAGNOSIS — G894 Chronic pain syndrome: Secondary | ICD-10-CM | POA: Diagnosis not present

## 2017-12-13 DIAGNOSIS — Z79891 Long term (current) use of opiate analgesic: Secondary | ICD-10-CM | POA: Diagnosis not present

## 2017-12-13 DIAGNOSIS — Z79899 Other long term (current) drug therapy: Secondary | ICD-10-CM | POA: Diagnosis not present

## 2017-12-13 DIAGNOSIS — M5136 Other intervertebral disc degeneration, lumbar region: Secondary | ICD-10-CM | POA: Diagnosis not present

## 2017-12-13 DIAGNOSIS — M47816 Spondylosis without myelopathy or radiculopathy, lumbar region: Secondary | ICD-10-CM | POA: Diagnosis not present

## 2018-01-10 DIAGNOSIS — G894 Chronic pain syndrome: Secondary | ICD-10-CM | POA: Diagnosis not present

## 2018-01-10 DIAGNOSIS — Z79899 Other long term (current) drug therapy: Secondary | ICD-10-CM | POA: Diagnosis not present

## 2018-01-10 DIAGNOSIS — Z79891 Long term (current) use of opiate analgesic: Secondary | ICD-10-CM | POA: Diagnosis not present

## 2018-01-10 DIAGNOSIS — M47816 Spondylosis without myelopathy or radiculopathy, lumbar region: Secondary | ICD-10-CM | POA: Diagnosis not present

## 2018-01-10 DIAGNOSIS — M545 Low back pain: Secondary | ICD-10-CM | POA: Diagnosis not present

## 2018-01-10 DIAGNOSIS — M5136 Other intervertebral disc degeneration, lumbar region: Secondary | ICD-10-CM | POA: Diagnosis not present

## 2018-01-22 DIAGNOSIS — Z79899 Other long term (current) drug therapy: Secondary | ICD-10-CM | POA: Diagnosis not present

## 2018-01-22 DIAGNOSIS — M5136 Other intervertebral disc degeneration, lumbar region: Secondary | ICD-10-CM | POA: Diagnosis not present

## 2018-01-22 DIAGNOSIS — M47816 Spondylosis without myelopathy or radiculopathy, lumbar region: Secondary | ICD-10-CM | POA: Diagnosis not present

## 2018-02-12 ENCOUNTER — Telehealth: Payer: Self-pay | Admitting: Cardiology

## 2018-02-12 NOTE — Telephone Encounter (Signed)
Patient takes plavix not coumadin. Contacted surgical center due to this being sent in march. Surgical center stated they did have this on file and were not requesting this again. Surgical center stated they were unsure why the patient is calling back. Left message for patient to inform him that the surgical center did in fact have paperwork.

## 2018-02-12 NOTE — Telephone Encounter (Signed)
Patient called wanting to know if Dr. Diona Browner has approved patient coming off of his coumdin to have a pain stimulate placed. He states that Dr. [place] Likes is wanting to do this procedure.  Office # [phone]

## 2018-02-15 ENCOUNTER — Other Ambulatory Visit: Payer: Self-pay | Admitting: Cardiology

## 2018-02-21 DIAGNOSIS — E1142 Type 2 diabetes mellitus with diabetic polyneuropathy: Secondary | ICD-10-CM | POA: Diagnosis not present

## 2018-02-21 DIAGNOSIS — H6123 Impacted cerumen, bilateral: Secondary | ICD-10-CM | POA: Diagnosis not present

## 2018-02-21 DIAGNOSIS — I10 Essential (primary) hypertension: Secondary | ICD-10-CM | POA: Diagnosis not present

## 2018-03-06 IMAGING — MR MR LUMBAR SPINE W/O CM
5 series · 48 of 48 positions shown · non-contrast
Comparison: Lumbar radiographs 03/11/2016. [HOSPITAL] [facility]
lumbar MRI 10/25/2011

CLINICAL DATA: 51-year-old male with progressed chronic lumbar back
pain. No known injury. Numbness in both legs. No prior surgery.

EXAM:
MRI LUMBAR SPINE WITHOUT CONTRAST
TECHNIQUE: Multiplanar, multisequence MR imaging of the lumbar spine was
performed. No intravenous contrast was administered.

[Series 3: T2 · sagittal · 4.0mm · 0.80mm/px · 4 of 12 slices shown (1 of 2)]
[im 1/12]
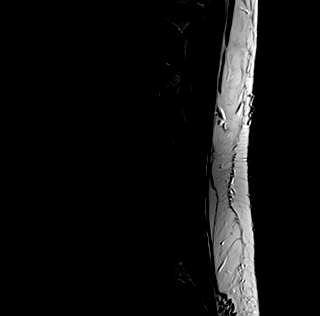
[im 4/12]
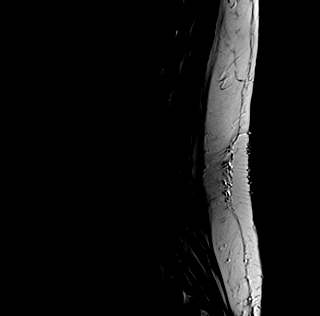
[im 8/12]
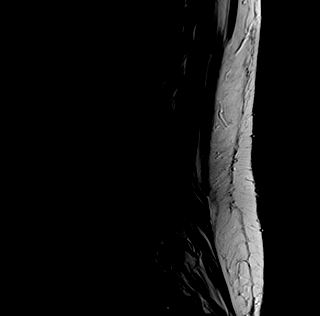
[im 12/12]
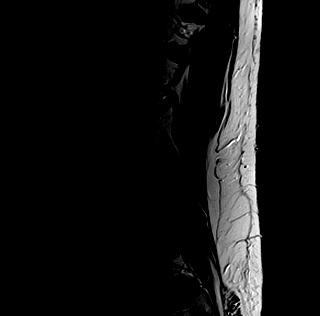

[Series 4: tirm sag · sagittal · 4.0mm · 0.80mm/px · 5 of 12 slices shown]
[im 1/12]
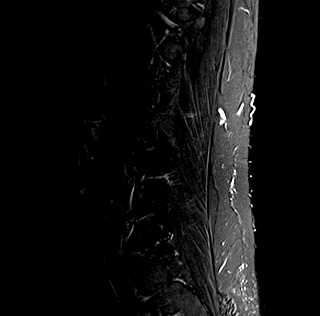
[im 3/12]
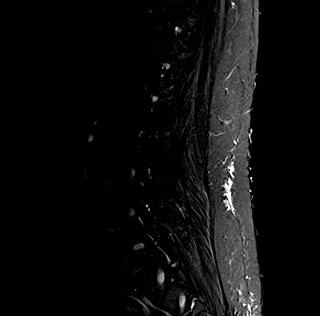
[im 6/12]
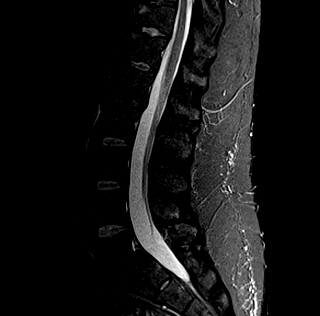
[im 9/12]
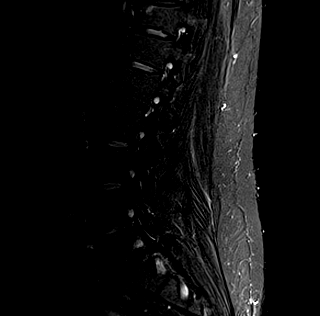
[im 12/12]
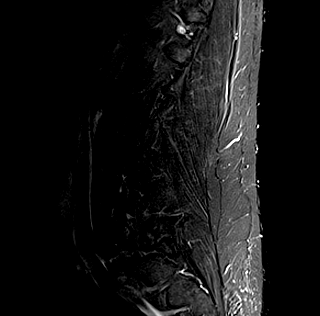

[Series 5: T1 · sagittal · 4.0mm · 0.80mm/px · 5 of 12 slices shown (1 of 2)]
[im 1/12]
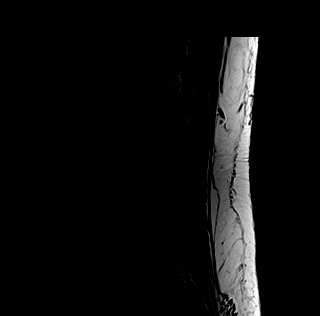
[im 3/12]
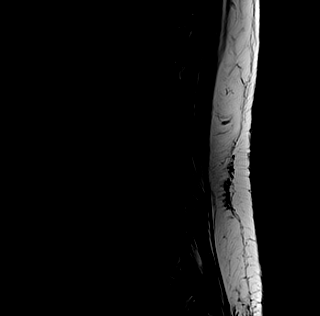
[im 6/12]
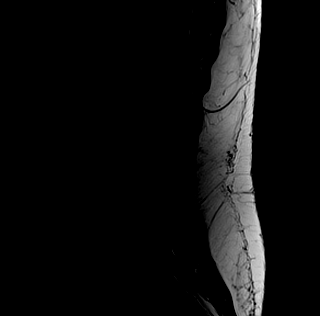
[im 9/12]
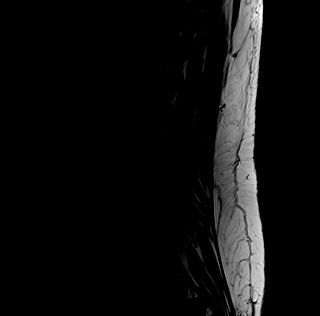
[im 12/12]
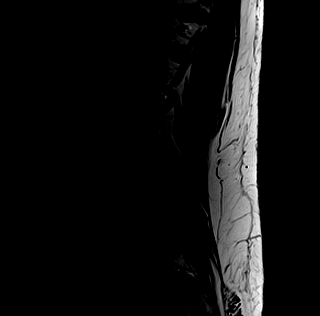

[Series 8: T2 · axial · 4.0mm · 0.70mm/px · z∈[-72,+150]mm · 17 of 42 slices shown (2 of 2)]
[im 1/42]
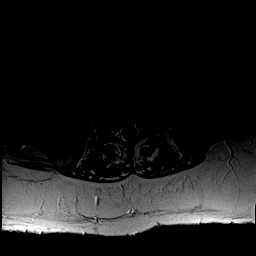
[im 3/42]
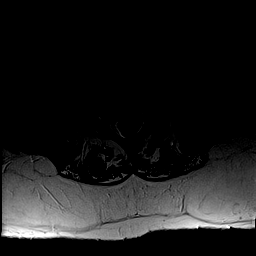
[im 6/42]
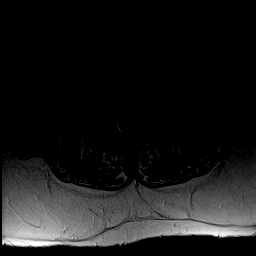
[im 8/42]
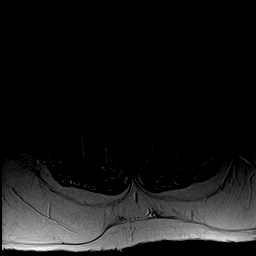
[im 11/42]
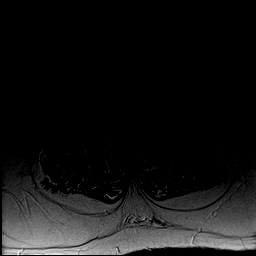
[im 13/42]
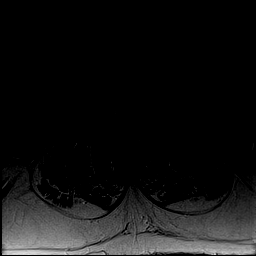
[im 16/42]
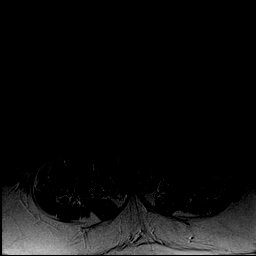
[im 18/42]
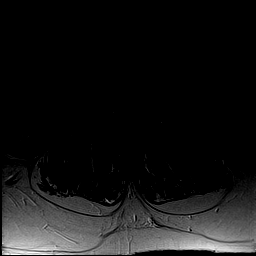
[im 21/42]
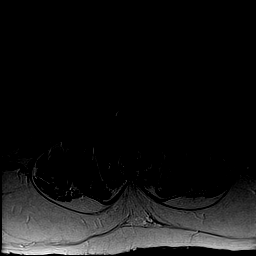
[im 24/42]
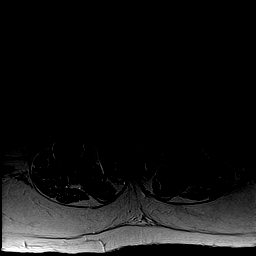
[im 26/42]
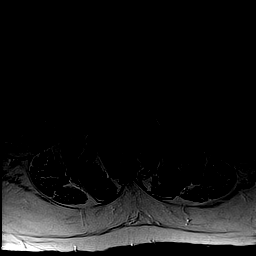
[im 29/42]
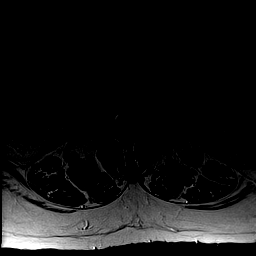
[im 31/42]
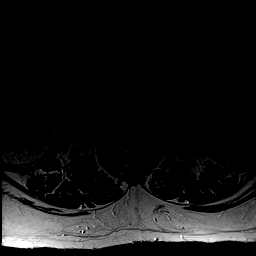
[im 34/42]
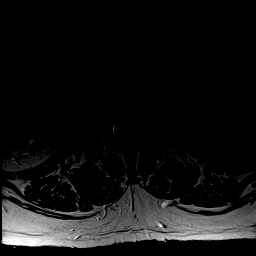
[im 36/42]
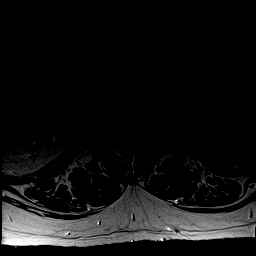
[im 39/42]
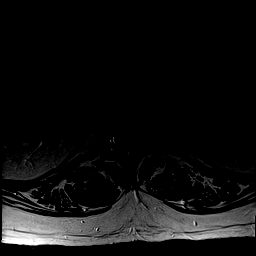
[im 42/42]
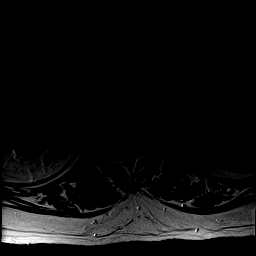

[Series 11: T1 · axial · 4.0mm · 0.70mm/px · z∈[-72,+150]mm · 17 of 42 slices shown (2 of 2)]
[im 1/42]
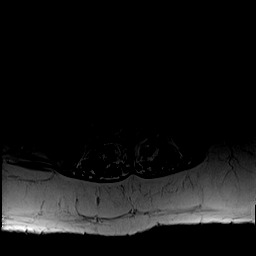
[im 3/42]
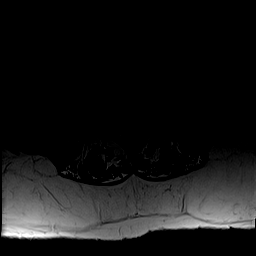
[im 6/42]
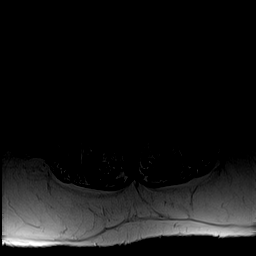
[im 8/42]
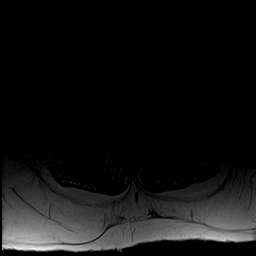
[im 11/42]
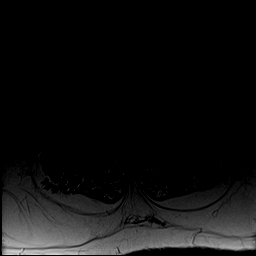
[im 13/42]
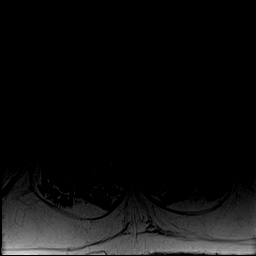
[im 16/42]
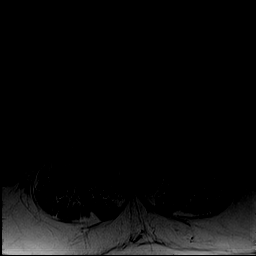
[im 18/42]
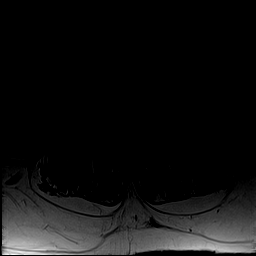
[im 21/42]
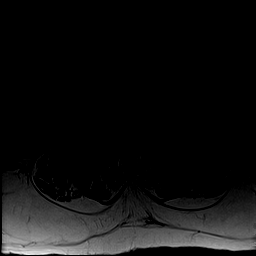
[im 24/42]
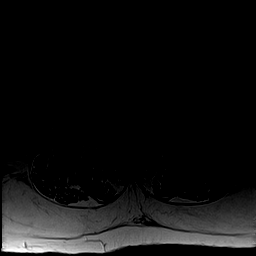
[im 26/42]
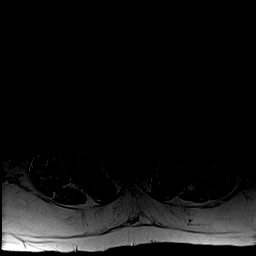
[im 29/42]
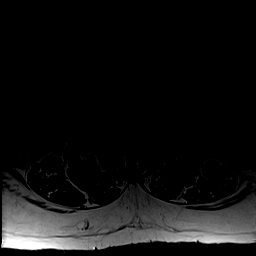
[im 31/42]
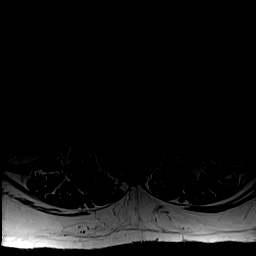
[im 34/42]
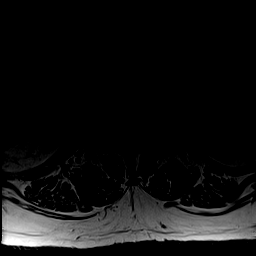
[im 36/42]
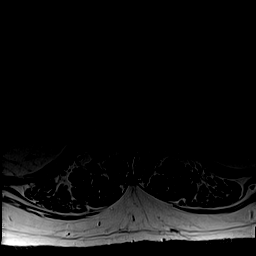
[im 39/42]
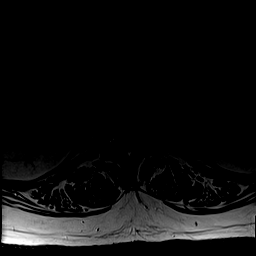
[im 42/42]
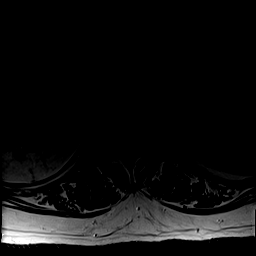

[48 of 48 positions shown; findings below may reference images not displayed]

FINDINGS: Segmentation:  Normal as demonstrated on the comparison radiographs.

Alignment: Grade 1 anterolisthesis of L5 on S1 is chronic but
progressed since 2078, currently measuring 7-8 mm (5-6 mm
previously). Relatively preserved lumbar lordosis otherwise.

Vertebrae: Chronic bilateral L5 pars fractures. Mild degenerative
anterior superior endplate marrow edema at T11. Otherwise visible
bone marrow signal is within normal limits. No other marrow edema or
acute osseous abnormality. Intact visible sacrum and SI joints.

Conus medullaris: Extends to the T12-L1 level and appears normal.

Paraspinal and other soft tissues:

Disc levels:

No lower thoracic spinal stenosis.

T12-L1:  Mild facet and ligament flavum hypertrophy.  No stenosis.

L1-L2: Mild chronic circumferential disc bulge has not significantly
changed. Mild facet hypertrophy. No stenosis.

L2-L3: Mild far lateral disc bulge and endplate spurring. No
stenosis.

L3-L4: Mild far lateral disc bulging and endplate spurring.
Borderline to mild bilateral L3 neural foraminal stenosis is stable
since 2078.

L4-L5:  Negative disc.  Mild facet hypertrophy.  No stenosis.

L5-S1: Anterolisthesis with disc desiccation. Trace vacuum disc.
Circumferential disc/ pseudo disc bulge with broad-based posterior
component has progressed since 2078. There are chronic bilateral L5
pars fractures. Moderate to severe bilateral facet hypertrophy has
increased. No spinal or lateral recess stenosis. Moderate to severe
bilateral L5 neural foraminal stenosis has progressed.
IMPRESSION: 1. Chronic bilateral L5 pars fractures associated with increased
grade 1 spondylolisthesis at L5-S1 since 2078. No spinal stenosis,
but progressed moderate to severe bilateral L5 neural foraminal
stenosis.
2. Elsewhere mild for age lumbar spine degeneration. Mild L1-L2 disc
bulging. Mild bilateral foraminal stenosis at L3-L4.

## 2018-03-17 DIAGNOSIS — X501XXA Overexertion from prolonged static or awkward postures, initial encounter: Secondary | ICD-10-CM | POA: Diagnosis not present

## 2018-03-17 DIAGNOSIS — S93402A Sprain of unspecified ligament of left ankle, initial encounter: Secondary | ICD-10-CM | POA: Diagnosis not present

## 2018-03-18 DIAGNOSIS — I10 Essential (primary) hypertension: Secondary | ICD-10-CM | POA: Diagnosis not present

## 2018-03-18 DIAGNOSIS — Z7902 Long term (current) use of antithrombotics/antiplatelets: Secondary | ICD-10-CM | POA: Diagnosis not present

## 2018-03-18 DIAGNOSIS — Z79899 Other long term (current) drug therapy: Secondary | ICD-10-CM | POA: Diagnosis not present

## 2018-03-18 DIAGNOSIS — Z7982 Long term (current) use of aspirin: Secondary | ICD-10-CM | POA: Diagnosis not present

## 2018-03-18 DIAGNOSIS — M25572 Pain in left ankle and joints of left foot: Secondary | ICD-10-CM | POA: Diagnosis not present

## 2018-03-18 DIAGNOSIS — E119 Type 2 diabetes mellitus without complications: Secondary | ICD-10-CM | POA: Diagnosis not present

## 2018-03-18 DIAGNOSIS — Z7984 Long term (current) use of oral hypoglycemic drugs: Secondary | ICD-10-CM | POA: Diagnosis not present

## 2018-03-18 DIAGNOSIS — S93402A Sprain of unspecified ligament of left ankle, initial encounter: Secondary | ICD-10-CM | POA: Diagnosis not present

## 2018-03-18 DIAGNOSIS — X501XXA Overexertion from prolonged static or awkward postures, initial encounter: Secondary | ICD-10-CM | POA: Diagnosis not present

## 2018-03-18 DIAGNOSIS — Z951 Presence of aortocoronary bypass graft: Secondary | ICD-10-CM | POA: Diagnosis not present

## 2018-03-18 DIAGNOSIS — S99912A Unspecified injury of left ankle, initial encounter: Secondary | ICD-10-CM | POA: Diagnosis not present

## 2018-03-18 DIAGNOSIS — J449 Chronic obstructive pulmonary disease, unspecified: Secondary | ICD-10-CM | POA: Diagnosis not present

## 2018-03-18 DIAGNOSIS — I2510 Atherosclerotic heart disease of native coronary artery without angina pectoris: Secondary | ICD-10-CM | POA: Diagnosis not present

## 2018-03-21 ENCOUNTER — Other Ambulatory Visit: Payer: Self-pay | Admitting: Cardiology

## 2018-03-21 MED ORDER — CARVEDILOL 3.125 MG PO TABS: ORAL_TABLET | ORAL | 1 refills | Status: DC

## 2018-03-21 MED ORDER — LISINOPRIL 2.5 MG PO TABS: 2.5000 mg | ORAL_TABLET | Freq: Every day | ORAL | 1 refills | Status: DC

## 2018-03-21 MED ORDER — PRAVASTATIN SODIUM 80 MG PO TABS: 80.0000 mg | ORAL_TABLET | Freq: Every evening | ORAL | 1 refills | Status: DC

## 2018-03-21 MED ORDER — RANOLAZINE ER 1000 MG PO TB12: ORAL_TABLET | ORAL | 1 refills | Status: DC

## 2018-03-21 MED ORDER — CLOPIDOGREL BISULFATE 75 MG PO TABS: 75.0000 mg | ORAL_TABLET | Freq: Every day | ORAL | 1 refills | Status: DC

## 2018-03-21 NOTE — Telephone Encounter (Signed)
Medication sent to pharmacy  

## 2018-03-21 NOTE — Telephone Encounter (Signed)
[facility]  Needs to Cardiac medications refilled

## 2018-05-07 DIAGNOSIS — T679XXA Effect of heat and light, unspecified, initial encounter: Secondary | ICD-10-CM | POA: Diagnosis not present

## 2018-05-07 DIAGNOSIS — R61 Generalized hyperhidrosis: Secondary | ICD-10-CM | POA: Diagnosis not present

## 2018-05-07 DIAGNOSIS — R531 Weakness: Secondary | ICD-10-CM | POA: Diagnosis not present

## 2018-05-07 DIAGNOSIS — T675XXA Heat exhaustion, unspecified, initial encounter: Secondary | ICD-10-CM | POA: Diagnosis not present

## 2018-05-14 ENCOUNTER — Other Ambulatory Visit: Payer: Self-pay | Admitting: Cardiology

## 2018-05-26 NOTE — Progress Notes (Deleted)
Cardiology Office Note  Date: 05/26/2018   ID: [name] [name], DOB [birth year], MRN [id number]  PCP: [name], MD  Primary Cardiologist: [name], MD   No chief complaint on file.   History of Present Illness: [name] [name] is a 52 y.o. male last seen in February.  Lipids from earlier this year are outlined below.  Past Medical History:  Diagnosis Date  . Asthma   . Bipolar disorder ([place])   . Chronic back pain   . Coronary atherosclerosis of native coronary artery    a. CABG 2003. b. NSTEMI 2010 s/p BMS to SVG-RCA c. PTCA/stent/asp thromb to SVG-RCA 01/2010, PTCA to SVG-RCA 04/2010 d. known occlusion of SVG-RCA 2013. e. NSTEMI 07/2013  s/p PTCA/DES to SVG-diagonal/intermediate/OM1.   . Essential hypertension   . GERD (gastroesophageal reflux disease)   . Gout   . Ischemic cardiomyopathy    a. EF 35-40% by cath 07/2013.  . Mixed hyperlipidemia   . Sinus bradycardia   . Type 2 diabetes mellitus ([place])     Past Surgical History:  Procedure Laterality Date  . CARDIAC CATHETERIZATION  06/19/2014   Procedure: CORONARY STENT INTERVENTION;  Surgeon: Leonie Man, MD;  Location: [facility] CATH LAB;  Service: Cardiovascular;;  DES to seg SVG Diag/OM/Ramus   . CARDIAC CATHETERIZATION N/A 11/22/2015   Procedure: Left Heart Cath and Coronary Angiography;  Surgeon: Belva Crome, MD;  Location: [place] CV LAB;  Service: Cardiovascular;  Laterality: N/A;  . CHOLECYSTECTOMY    . CORONARY ARTERY BYPASS GRAFT     2003, LIMA to LAD; SVG to diagonal; SVG to OM, ramus, and diagonal; SVG to RCA  . LEFT HEART CATHETERIZATION WITH CORONARY ANGIOGRAM N/A 01/28/2015   Procedure: LEFT HEART CATHETERIZATION WITH CORONARY ANGIOGRAM;  Surgeon: Leonie Man, MD;  Location: [facility] CATH LAB;  Service: Cardiovascular;  Laterality: N/A;  . LEFT HEART CATHETERIZATION WITH CORONARY/GRAFT ANGIOGRAM N/A 07/14/2013   Procedure: LEFT HEART CATHETERIZATION WITH Beatrix Fetters;  Surgeon:  Burnell Blanks, MD;  Location: [facility] CATH LAB;  Service: Cardiovascular;  Laterality: N/A;  . LEFT HEART CATHETERIZATION WITH CORONARY/GRAFT ANGIOGRAM N/A 12/29/2013   Procedure: LEFT HEART CATHETERIZATION WITH Beatrix Fetters;  Surgeon: Leonie Man, MD;  Location: [facility] CATH LAB;  Service: Cardiovascular;  Laterality: N/A;  . LEFT HEART CATHETERIZATION WITH CORONARY/GRAFT ANGIOGRAM N/A 06/19/2014   Procedure: LEFT HEART CATHETERIZATION WITH Beatrix Fetters;  Surgeon: Leonie Man, MD;  Location: [facility] CATH LAB;  Service: Cardiovascular;  Laterality: N/A;  . Metal plate to chin    . PERCUTANEOUS CORONARY STENT INTERVENTION (PCI-S)  07/14/2013   Procedure: PERCUTANEOUS CORONARY STENT INTERVENTION (PCI-S);  Surgeon: Burnell Blanks, MD;  Location: [facility] CATH LAB;  Service: Cardiovascular;;  . Pin placement left leg      Current Outpatient Medications  Medication Sig Dispense Refill  . acetaminophen (TYLENOL) 325 MG tablet Take 2 tablets (650 mg total) by mouth every 4 (four) hours as needed for headache or mild pain.    Marland Kitchen albuterol (PROVENTIL HFA;VENTOLIN HFA) 108 (90 BASE) MCG/ACT inhaler Inhale 2 puffs into the lungs every 6 (six) hours as needed for wheezing or shortness of breath.    . allopurinol (ZYLOPRIM) 300 MG tablet Take 300 mg by mouth daily.  0  . aspirin EC 81 MG tablet Take 1 tablet (81 mg total) by mouth daily.    . Blood Pressure Monitoring KIT 1 each by Does not apply route as directed. 1 kit 0  .  carvedilol (COREG) 3.125 MG tablet TAKE ONE TABLET BY MOUTH 2 TIMES A DAY WITH A MEAL 180 tablet 1  . clopidogrel (PLAVIX) 75 MG tablet Take 1 tablet (75 mg total) by mouth daily. 90 tablet 1  . diazepam (VALIUM) 10 MG tablet Take 10 mg by mouth 2 (two) times daily.    Marland Kitchen FLOVENT HFA 220 MCG/ACT inhaler     . furosemide (LASIX) 20 MG tablet TAKE ONE TABLET BY MOUTH EVERY NIGHT AT BEDTIME 90 tablet 3  . HYDROcodone-acetaminophen (NORCO) 7.5-325 MG per tablet Take  1 tablet by mouth every 6 (six) hours as needed for moderate pain.    Marland Kitchen HYSINGLA ER 30 MG T24A Take 1 tablet by mouth daily.  0  . isosorbide mononitrate (IMDUR) 60 MG 24 hr tablet TAKE ONE TABLET BY MOUTH 2 TIMES A DAY 180 tablet 0  . lisinopril (PRINIVIL,ZESTRIL) 2.5 MG tablet Take 1 tablet (2.5 mg total) by mouth daily. 90 tablet 1  . metFORMIN (GLUCOPHAGE) 1000 MG tablet Take 1 tablet (1,000 mg total) by mouth 2 (two) times daily with a meal.    . nitroGLYCERIN (NITROSTAT) 0.4 MG SL tablet PLACE 1 TABLET UNDER THE TONGUE AS NEEDED FOR CHEST PAIN. MAY REPEAT EVERY 5 MINUTES UP TO 3 DOSES. IF NO RELIEF AFTER 3 DOSES, CALL 911 OR 25 tablet 0  . Omega-3 Fatty Acids (FISH OIL) 1000 MG CAPS Take 1,000-2,000 mg by mouth 2 (two) times daily. 1 IN THE MORNING AND 2 AT BEDTIME    . ONETOUCH VERIO test strip     . pantoprazole (PROTONIX) 40 MG tablet Take 40 mg by mouth daily.     . pravastatin (PRAVACHOL) 80 MG tablet Take 1 tablet (80 mg total) by mouth every evening. 90 tablet 1  . ranolazine (RANEXA) 1000 MG SR tablet TAKE ONE TABLET BY MOUTH 2 TIMES A DAY 180 tablet 1  . Varenicline Tartrate (CHANTIX PO) Take by mouth.     No current facility-administered medications for this visit.    Allergies:  Patient has no known allergies.   Social History: The patient  reports that he has been smoking cigarettes. He started smoking about 39 years ago. He has a 26.40 pack-year smoking history. He has never used smokeless tobacco. He reports that he has current or past drug history. He reports that he does not drink alcohol.   Family History: The patient's family history includes Bowel Disease in his mother; Hernia in his father; Pneumonia in his daughter; Stroke in his unknown relative.   ROS:  Please see the history of present illness. Otherwise, complete review of systems is positive for {NONE DEFAULTED:18576::"none"}.  All other systems are reviewed and negative.   Physical Exam: VS:  There were no  vitals taken for this visit., BMI There is no height or weight on file to calculate BMI.  Wt Readings from Last 3 Encounters:  11/23/17 198 lb (89.8 kg)  04/20/17 202 lb (91.6 kg)  10/12/16 215 lb (97.5 kg)    General: Patient appears comfortable at rest. HEENT: Conjunctiva and lids normal, oropharynx clear with moist mucosa. Neck: Supple, no elevated JVP or carotid bruits, no thyromegaly. Lungs: Clear to auscultation, nonlabored breathing at rest. Cardiac: Regular rate and rhythm, no S3 or significant systolic murmur, no pericardial rub. Abdomen: Soft, nontender, no hepatomegaly, bowel sounds present, no guarding or rebound. Extremities: No pitting edema, distal pulses 2+. Skin: Warm and dry. Musculoskeletal: No kyphosis. Neuropsychiatric: Alert and oriented x3, affect grossly appropriate.  ECG: I personally reviewed the tracing from 04/20/2017 which showed sinus rhythm with nonspecific T wave changes.  Recent Labwork:  February 2019: cholesterol 146, TG 96, HDL 62, LDL 64, BUN 15, creatinine 0.95, HgbA1c 5.2  Other Studies Reviewed Today:  Cardiac catheterization 11/22/2015: 1. Ramus lesion, 95% stenosed. 2. Saphenous vein bypass graft disease with total occlusion of the SVG to the distal RCA. The sequential SVG to the diagonal and ramus intermedius is disease with occlusion of the limb to the ramus intermedius. The side-to-side anastomosis on the diagonal remains patent. The stents in the ostium and distal saphenous vein graft sites are widely patent. 3. Patent LIMA to LAD 4. Total occlusion of the native right. The distal right coronary/PDA fills byleft-to-right collaterals from the LAD. Segmental diffuse disease in the left main. Total occlusion of the LAD. High-grade obstruction in the circumflex and ramus intermedius originating at the left main. 5. Widely patent LIMA to the LAD  When compared to the prior angiographic images, no significant change has occurred. Inferior  wall hypokinesis, estimated ejection fraction 45%  Assessment and Plan:    Current medicines were reviewed with the patient today.  No orders of the defined types were placed in this encounter.   Disposition:  Signed, Satira Sark, MD, [facility] 05/26/2018 5:51 PM    [facility] [facility] [place], [place], [place] [address] Phone: [phone]; Fax: [phone]

## 2018-05-27 ENCOUNTER — Ambulatory Visit: Payer: Medicare Other | Admitting: Cardiology

## 2018-06-04 DIAGNOSIS — H6123 Impacted cerumen, bilateral: Secondary | ICD-10-CM | POA: Diagnosis not present

## 2018-06-04 DIAGNOSIS — I10 Essential (primary) hypertension: Secondary | ICD-10-CM | POA: Diagnosis not present

## 2018-06-04 DIAGNOSIS — Z1389 Encounter for screening for other disorder: Secondary | ICD-10-CM | POA: Diagnosis not present

## 2018-06-04 DIAGNOSIS — Z0000 Encounter for general adult medical examination without abnormal findings: Secondary | ICD-10-CM | POA: Diagnosis not present

## 2018-06-04 DIAGNOSIS — Z125 Encounter for screening for malignant neoplasm of prostate: Secondary | ICD-10-CM | POA: Diagnosis not present

## 2018-06-04 DIAGNOSIS — E1142 Type 2 diabetes mellitus with diabetic polyneuropathy: Secondary | ICD-10-CM | POA: Diagnosis not present

## 2018-06-07 ENCOUNTER — Other Ambulatory Visit: Payer: Self-pay

## 2018-06-07 ENCOUNTER — Emergency Department (HOSPITAL_COMMUNITY): Payer: Medicare Other

## 2018-06-07 ENCOUNTER — Emergency Department (HOSPITAL_COMMUNITY)
Admission: EM | Admit: 2018-06-07 | Discharge: 2018-06-07 | Disposition: A | Discharge status: Home / Self Care | Payer: Medicare Other | Attending: Emergency Medicine | Admitting: Emergency Medicine

## 2018-06-07 ENCOUNTER — Encounter (HOSPITAL_COMMUNITY): Payer: Self-pay | Admitting: Emergency Medicine

## 2018-06-07 DIAGNOSIS — I2510 Atherosclerotic heart disease of native coronary artery without angina pectoris: Secondary | ICD-10-CM | POA: Diagnosis not present

## 2018-06-07 DIAGNOSIS — I255 Ischemic cardiomyopathy: Secondary | ICD-10-CM | POA: Diagnosis not present

## 2018-06-07 DIAGNOSIS — J45909 Unspecified asthma, uncomplicated: Secondary | ICD-10-CM | POA: Diagnosis not present

## 2018-06-07 DIAGNOSIS — M25572 Pain in left ankle and joints of left foot: Secondary | ICD-10-CM | POA: Insufficient documentation

## 2018-06-07 DIAGNOSIS — E119 Type 2 diabetes mellitus without complications: Secondary | ICD-10-CM | POA: Diagnosis not present

## 2018-06-07 DIAGNOSIS — M79672 Pain in left foot: Secondary | ICD-10-CM | POA: Diagnosis not present

## 2018-06-07 DIAGNOSIS — F17210 Nicotine dependence, cigarettes, uncomplicated: Secondary | ICD-10-CM | POA: Diagnosis not present

## 2018-06-07 DIAGNOSIS — M7989 Other specified soft tissue disorders: Secondary | ICD-10-CM | POA: Diagnosis not present

## 2018-06-07 DIAGNOSIS — E782 Mixed hyperlipidemia: Secondary | ICD-10-CM | POA: Insufficient documentation

## 2018-06-07 DIAGNOSIS — Z79899 Other long term (current) drug therapy: Secondary | ICD-10-CM | POA: Diagnosis not present

## 2018-06-07 DIAGNOSIS — Z7984 Long term (current) use of oral hypoglycemic drugs: Secondary | ICD-10-CM | POA: Insufficient documentation

## 2018-06-07 DIAGNOSIS — Z7982 Long term (current) use of aspirin: Secondary | ICD-10-CM | POA: Diagnosis not present

## 2018-06-07 LAB — CBG MONITORING, ED: GLUCOSE-CAPILLARY: 125 mg/dL — AB (ref 70–99)

## 2018-06-07 MED ORDER — DOXYCYCLINE HYCLATE 100 MG PO CAPS: 100.0000 mg | ORAL_CAPSULE | Freq: Two times a day (BID) | ORAL | 0 refills | Status: DC

## 2018-06-07 MED ORDER — TRAMADOL HCL 50 MG PO TABS: ORAL_TABLET | ORAL | 0 refills | Status: DC

## 2018-06-07 MED ORDER — TRAMADOL HCL 50 MG PO TABS
100.0000 mg | ORAL_TABLET | Freq: Once | ORAL | Status: AC
  Administered 2018-06-07: 100 mg via ORAL
  Filled 2018-06-07: qty 2

## 2018-06-07 MED ORDER — DEXAMETHASONE SODIUM PHOSPHATE 10 MG/ML IJ SOLN
10.0000 mg | Freq: Once | INTRAMUSCULAR | Status: AC
  Administered 2018-06-07: 10 mg via INTRAMUSCULAR
  Filled 2018-06-07: qty 1

## 2018-06-07 NOTE — ED Notes (Signed)
Pt returned from xray

## 2018-06-07 NOTE — ED Triage Notes (Signed)
Pt states he jumped down from a ladder 4 mos ago and has had left foot and ankle swelling since, pt reports he was treated at [facility] for same and was diagnosed with ankle sprain

## 2018-06-07 NOTE — Discharge Instructions (Addendum)
Your vital signs are within normal limits.  Your fingerstick glucose was slightly above the normal at 125.  The x-ray of your foot is negative for a foreign body, gas from any infection, fracture, or dislocation.  There is noted some diffuse swelling involving her foot.  The x-ray of your ankle is negative for fracture or dislocation.  I suspect that you have inflammation involving the foot and ankle causing some of your discomfort.  You have an open skin area on the back of your heel.  This will be covered with antibiotic for now.  Please see Dr. Romeo Apple for orthopedic evaluation.  Please see Dr.[name] for follow-up of your glucose, and also for recheck of your heel.  It is important that you use closed in shoes, and shoes with good support given your diabetic status.  Flip-flops will not give you this kind of support.  Please use your ankle splint over the next 7 days. Elevate your feet as much as possible.

## 2018-06-07 NOTE — ED Notes (Signed)
ED Provider at bedside. 

## 2018-06-07 NOTE — ED Provider Notes (Signed)
[facility] EMERGENCY DEPARTMENT Provider Note   CSN: [id number] Arrival date & time: 06/07/18  2022     History   Chief Complaint Chief Complaint  Patient presents with  . Foot Injury    HPI [name] [name] is a 52 y.o. male.   patient is a 52 year old male who presents to the emergency department with a complaint of left foot and ankle pain.  Patient states that about 4 months ago he jumped off of a ladder and injured his left foot and ankle.  He was seen at the [facility] emergency department.  He was told at that time that he had a bad sprain.  The patient states he has been having pain and swelling off and on since that time.  He has been putting off going to see anyone about his foot and ankle.  He says that he also has a small ulcer/sore on the back of the heel of this foot.  The patient is concerned now because the pain continues.  He is also concerned because he is a diabetic.  He has been encouraged by his family to have this checked, so he came to the emergency department for evaluation.  Patient denies fever or chills.  He denies any significant drainage from the area of the left heel.  It is of note that the patient states that he rarely wears shoes.  He wears flip-flops almost year-round, except when the weather is so severe that he needs to wear his boots.  He says that he does a lot of weed eating, and he usually alleviates there barefoot, or with flip-flops on.    The history is provided by the patient.    Past Medical History:  Diagnosis Date  . Asthma   . Bipolar disorder ([place])   . Chronic back pain   . Coronary atherosclerosis of native coronary artery    a. CABG 2003. b. NSTEMI 2010 s/p BMS to SVG-RCA c. PTCA/stent/asp thromb to SVG-RCA 01/2010, PTCA to SVG-RCA 04/2010 d. known occlusion of SVG-RCA 2013. e. NSTEMI 07/2013  s/p PTCA/DES to SVG-diagonal/intermediate/OM1.   . Essential hypertension   . GERD (gastroesophageal reflux disease)   . Gout   .  Ischemic cardiomyopathy    a. EF 35-40% by cath 07/2013.  . Mixed hyperlipidemia   . Sinus bradycardia   . Type 2 diabetes mellitus [facility])     Patient Active Problem List   Diagnosis Date Noted  . Hypotension due to drugs 11/22/2015  . Accelerating angina ([place]) 01/28/2015  . CAD S/P percutaneous coronary angioplasty - PCI of SVG-OM 01/28/2015  . Polysubstance abuse ([place]) 01/28/2015  . Coronary artery disease involving coronary bypass graft of native heart with unstable angina pectoris ([place]) 01/28/2015  . Exertional angina ([place]) 01/16/2015  . Chest pain 01/16/2015  . Coronary atherosclerosis of native coronary artery 06/20/2014  . Cardiomyopathy, ischemic 07/15/2013  . Essential hypertension, benign 12/05/2012  . Sinus bradycardia 12/05/2012  . TOBACCO USER 08/10/2009  . Mixed hyperlipidemia 06/17/2009    Past Surgical History:  Procedure Laterality Date  . CARDIAC CATHETERIZATION  06/19/2014   Procedure: CORONARY STENT INTERVENTION;  Surgeon: Leonie Man, MD;  Location: [facility] CATH LAB;  Service: Cardiovascular;;  DES to seg SVG Diag/OM/[place]   . CARDIAC CATHETERIZATION N/A 11/22/2015   Procedure: Left Heart Cath and Coronary Angiography;  Surgeon: Belva Crome, MD;  Location: [place] CV LAB;  Service: Cardiovascular;  Laterality: N/A;  . CHOLECYSTECTOMY    . CORONARY ARTERY  BYPASS GRAFT     2003, LIMA to LAD; SVG to diagonal; SVG to OM, ramus, and diagonal; SVG to RCA  . LEFT HEART CATHETERIZATION WITH CORONARY ANGIOGRAM N/A 01/28/2015   Procedure: LEFT HEART CATHETERIZATION WITH CORONARY ANGIOGRAM;  Surgeon: Leonie Man, MD;  Location: [facility] CATH LAB;  Service: Cardiovascular;  Laterality: N/A;  . LEFT HEART CATHETERIZATION WITH CORONARY/GRAFT ANGIOGRAM N/A 07/14/2013   Procedure: LEFT HEART CATHETERIZATION WITH Beatrix Fetters;  Surgeon: Burnell Blanks, MD;  Location: [facility] CATH LAB;  Service: Cardiovascular;  Laterality: N/A;  . LEFT HEART CATHETERIZATION WITH  CORONARY/GRAFT ANGIOGRAM N/A 12/29/2013   Procedure: LEFT HEART CATHETERIZATION WITH Beatrix Fetters;  Surgeon: Leonie Man, MD;  Location: [facility] CATH LAB;  Service: Cardiovascular;  Laterality: N/A;  . LEFT HEART CATHETERIZATION WITH CORONARY/GRAFT ANGIOGRAM N/A 06/19/2014   Procedure: LEFT HEART CATHETERIZATION WITH Beatrix Fetters;  Surgeon: Leonie Man, MD;  Location: [facility] CATH LAB;  Service: Cardiovascular;  Laterality: N/A;  . Metal plate to chin    . PERCUTANEOUS CORONARY STENT INTERVENTION (PCI-S)  07/14/2013   Procedure: PERCUTANEOUS CORONARY STENT INTERVENTION (PCI-S);  Surgeon: Burnell Blanks, MD;  Location: [facility] CATH LAB;  Service: Cardiovascular;;  . Pin placement left leg          Home Medications    Prior to Admission medications   Medication Sig Start Date End Date Taking? Authorizing Provider  acetaminophen (TYLENOL) 325 MG tablet Take 2 tablets (650 mg total) by mouth every 4 (four) hours as needed for headache or mild pain. 06/20/14   Erlene Quan, PA-C  albuterol (PROVENTIL HFA;VENTOLIN HFA) 108 (90 BASE) MCG/ACT inhaler Inhale 2 puffs into the lungs every 6 (six) hours as needed for wheezing or shortness of breath.    [provider]  allopurinol (ZYLOPRIM) 300 MG tablet Take 300 mg by mouth daily. 11/03/15   [provider]  aspirin EC 81 MG tablet Take 1 tablet (81 mg total) by mouth daily. 12/05/12   Donney Dice, PA-C  Blood Pressure Monitoring KIT 1 each by Does not apply route as directed. 09/29/14   Satira Sark, MD  carvedilol (COREG) 3.125 MG tablet TAKE ONE TABLET BY MOUTH 2 TIMES A DAY WITH A MEAL 03/21/18   Satira Sark, MD  clopidogrel (PLAVIX) 75 MG tablet Take 1 tablet (75 mg total) by mouth daily. 03/21/18   Satira Sark, MD  diazepam (VALIUM) 10 MG tablet Take 10 mg by mouth 2 (two) times daily.    [provider]  FLOVENT [facility] 220 MCG/ACT inhaler  10/10/17   [provider]    furosemide (LASIX) 20 MG tablet TAKE ONE TABLET BY MOUTH EVERY NIGHT AT BEDTIME 10/15/17   Satira Sark, MD  HYDROcodone-acetaminophen ([place]) 7.5-325 MG per tablet Take 1 tablet by mouth every 6 (six) hours as needed for moderate pain.    [provider]  [facility] ER 30 MG T24A Take 1 tablet by mouth daily. 11/17/17   [provider]  isosorbide mononitrate (IMDUR) 60 MG 24 hr tablet TAKE ONE TABLET BY MOUTH 2 TIMES A DAY 05/14/18   Satira Sark, MD  lisinopril (PRINIVIL,ZESTRIL) 2.5 MG tablet Take 1 tablet (2.5 mg total) by mouth daily. 03/21/18   Satira Sark, MD  metFORMIN (GLUCOPHAGE) 1000 MG tablet Take 1 tablet (1,000 mg total) by mouth 2 (two) times daily with a meal. 11/23/15   Brett Canales, PA-C  nitroGLYCERIN (NITROSTAT) 0.4 MG SL tablet  PLACE 1 TABLET UNDER THE TONGUE AS NEEDED FOR CHEST PAIN. MAY REPEAT EVERY 5 MINUTES UP TO 3 DOSES. IF NO RELIEF AFTER 3 DOSES, CALL 911 OR 05/14/18   Satira Sark, MD  Omega-3 Fatty Acids (FISH OIL) 1000 MG CAPS Take 1,000-2,000 mg by mouth 2 (two) times daily. 1 IN THE MORNING AND 2 AT BEDTIME    [provider]  ONETOUCH VERIO test strip  10/10/17   [provider]  pantoprazole (PROTONIX) 40 MG tablet Take 40 mg by mouth daily.     [provider]  pravastatin (PRAVACHOL) 80 MG tablet Take 1 tablet (80 mg total) by mouth every evening. 03/21/18   Satira Sark, MD  ranolazine (RANEXA) 1000 MG SR tablet TAKE ONE TABLET BY MOUTH 2 TIMES A DAY 03/21/18   Satira Sark, MD  Varenicline Tartrate (CHANTIX PO) Take by mouth.    [provider]    Family History Family History  Problem Relation Age of Onset  . Stroke Unknown        family Hx   . Bowel Disease Mother   . Hernia Father   . Pneumonia Daughter     Social History Social History   Tobacco Use  . Smoking status: Current Every Day Smoker    Packs/day: 0.80    Years: 33.00    Pack years: 26.40    Types:  Cigarettes    Start date: 07/01/1978  . Smokeless tobacco: Never Used  . Tobacco comment: trying to quit, 3/4 pack per day. Started the nicotine patch on 01-10-14-   Substance Use Topics  . Alcohol use: No    Alcohol/week: 0.0 standard drinks    Comment: drink a can of beer occasionally every year   . Drug use: Yes    Frequency: 2.0 times per week    Types: Marijuana     Allergies   Patient has no known allergies.   Review of Systems Review of Systems  Constitutional: Negative for activity change.       All ROS Neg except as noted in HPI  HENT: Negative for nosebleeds.   Eyes: Negative for photophobia and discharge.  Respiratory: Negative for cough, shortness of breath and wheezing.   Cardiovascular: Negative for chest pain and palpitations.  Gastrointestinal: Negative for abdominal pain and blood in stool.  Genitourinary: Negative for dysuria, frequency and hematuria.  Musculoskeletal: Positive for arthralgias. Negative for back pain and neck pain.  Skin:       Sore on left heel  Neurological: Negative for dizziness, seizures and speech difficulty.  Psychiatric/Behavioral: Negative for confusion and hallucinations.     Physical Exam Updated Vital Signs BP 122/80 (BP Location: Left Arm)   Pulse 63   Temp 98.2 F (36.8 C) (Oral)   Resp 18   Ht 5' 4"  (1.626 m)   Wt 84.4 kg   SpO2 100%   BMI 31.93 kg/m   Physical Exam  Constitutional: He is oriented to person, place, and time. He appears well-developed and well-nourished.  Non-toxic appearance.  HENT:  Head: Normocephalic.  Right Ear: Tympanic membrane and external ear normal.  Left Ear: Tympanic membrane and external ear normal.  Eyes: Pupils are equal, round, and reactive to light. EOM and lids are normal.  Neck: Normal range of motion. Neck supple. Carotid bruit is not present.  Cardiovascular: Normal rate, regular rhythm, normal heart sounds, intact distal pulses and normal pulses.  Pulmonary/Chest: Breath  sounds normal. No respiratory distress.  Abdominal: Soft. Bowel sounds are normal. There is no tenderness. There is no guarding.  Musculoskeletal: Normal range of motion.  There is good range of motion of the left hip and knee.  There is mild/moderate swelling of the left ankle and dorsum of the foot.  There appears to be some swelling of the toes of the left foot.  The dorsalis pedis pulses 2+.  The capillary refill is less than 3 seconds.  There are no lesions noted between the toes.  There is some tenderness to palpation of the metatarsal heads at the plantar surface.  There is a small open sore near the insertion of the Achilles area.  There is no drainage from it.  There is no red streaks noted.  The Achilles tendon is intact.  There is soreness to palpation of the ankle at the dorsum of the foot.  There are no hot areas noted of the left lower extremity.  There is good range of motion of the right lower extremity without problem.  Lymphadenopathy:       Head (right side): No submandibular adenopathy present.       Head (left side): No submandibular adenopathy present.    He has no cervical adenopathy.  Neurological: He is alert and oriented to person, place, and time. He has normal strength. No cranial nerve deficit or sensory deficit.  Skin: Skin is warm and dry.  Psychiatric: He has a normal mood and affect. His speech is normal.  Nursing note and vitals reviewed.    ED Treatments / Results  Labs (all labs ordered are listed, but only abnormal results are displayed) Labs Reviewed  CBG MONITORING, ED - Abnormal; Notable for the following components:      Result Value   Glucose-Capillary 125 (*)    All other components within normal limits    EKG None  Radiology Dg Ankle Complete Left  Result Date: 06/07/2018 CLINICAL DATA:  Patient states he jumped down from a ladder 4 months ago and has had left foot and ankle swelling since, patient states he was treated at [facility] for same and  was diagnosed with ankle sprain. Pain is now worse. EXAM: LEFT ANKLE COMPLETE - 3+ VIEW COMPARISON:  03/18/2018 FINDINGS: There is diffuse soft tissue swelling, increased compared to prior study. No acute fracture or subluxation. IMPRESSION: Soft tissue swelling. Electronically Signed   By: Nolon Nations M.D.   On: 06/07/2018 21:14   Dg Foot Complete Left  Result Date: 06/07/2018 CLINICAL DATA:  Patient states he jumped down from a ladder 4 months ago and has had left foot and ankle swelling since, patient states he was treated at [facility] for same and was diagnosed with ankle sprain. Pain has gotten worse. Pain shoots into the toes and leg. EXAM: LEFT FOOT - COMPLETE 3+ VIEW COMPARISON:  LEFT ankle on 03/18/2018 FINDINGS: There is diffuse soft tissue swelling. No acute fracture or subluxation. No radiopaque foreign body or soft tissue gas. IMPRESSION: Soft tissue swelling.  No acute fracture. Electronically Signed   By: Nolon Nations M.D.   On: 06/07/2018 21:13    Procedures Procedures (including critical care time)  Medications Ordered in ED Medications - No data to display   Initial Impression / Assessment and Plan / ED Course  I have reviewed the triage vital signs and the nursing notes.  Pertinent labs & imaging results that were available during my care of the patient were reviewed by me and considered in my medical decision making (  see chart for details).      Final Clinical Impressions(s) / ED Diagnoses MDM  Vital signs within normal limits.  Capillary glucose is 125.  X-ray shows some diffuse soft tissue swelling of the left foot and ankle.  There is no soft tissue gas noted on the x-ray of the foot or the ankle.  There is no fracture no dislocation noted of the foot or the ankle on the left.  I discussed with the patient that this may be a reinjury of previous injury given that he does a lot of work and walking on [employer].  I discussed with the patient that this may  be related to some arthritis changes, and we also discussed the possibility of this being a form of diabetic neuropathy.  I have asked the patient to see Dr. Aline Brochure for orthopedic evaluation of this foot and ankle pain.  I have asked the patient to see his primary physician for follow-up of the ulcer on the back of his heel, and also follow-up of his glucose.  Patient is prescribed doxycycline, and 12 tablets of Ultram.  The patient was given an injection of steroid medication here in the emergency department.   Final diagnoses:  Left ankle pain, unspecified chronicity    ED Discharge Orders         Ordered    traMADol (ULTRAM) 50 MG tablet     06/07/18 2159    doxycycline (VIBRAMYCIN) 100 MG capsule  2 times daily     06/07/18 2210           Lily Kocher, PA-C 06/07/18 [place], [place], DO 06/07/18 2328

## 2018-06-07 NOTE — ED Notes (Signed)
Patient transported to X-ray 

## 2018-06-11 ENCOUNTER — Other Ambulatory Visit: Payer: Self-pay | Admitting: Cardiology

## 2018-07-11 ENCOUNTER — Encounter: Payer: Self-pay | Admitting: *Deleted

## 2018-07-11 NOTE — Progress Notes (Signed)
Cardiology Office Note  Date: 07/12/2018   ID: [name] [name], DOB 1965-11-28, MRN [id number]  PCP: [name], MD  Primary Cardiologist: [name], MD   Chief Complaint  Patient presents with  . Coronary Artery Disease    History of Present Illness: [name] [name] is a 52 y.o. male last seen in February.  He presents for a routine visit.  States that he has had some episodes of angina and shortness of breath, responsive to nitroglycerin.  He reports compliance with his antianginal medications.  Also describes what sounds like an episode of vasovagal syncope that happened about 3 or 4 months ago.  This is not been recurrent.  He reports no palpitations.  I talked with him about smoking cessation today.  This is been a long-term struggle for him.  He has tried Chantix, still not able to quit completely.  At the present time he is smoking about 1 pack/day.  We talked about setting goals and also nicotine replacement when it is time.  I reviewed his lab work from earlier in the year per PCP.  I personally reviewed his ECG today which shows sinus rhythm with old inferolateral infarct pattern, nonspecific T wave changes.  Current cardiac regimen includes aspirin, Plavix, Coreg, Lasix, Imdur, lisinopril, Pravachol, and Ranexa.   Past Medical History:  Diagnosis Date  . Asthma   . Bipolar disorder ([place])   . Chronic back pain   . Coronary atherosclerosis of native coronary artery    a. CABG 2003. b. NSTEMI 2010 s/p BMS to SVG-RCA c. PTCA/stent/asp thromb to SVG-RCA 01/2010, PTCA to SVG-RCA 04/2010 d. known occlusion of SVG-RCA 2013. e. NSTEMI 07/2013  s/p PTCA/DES to SVG-diagonal/intermediate/OM1.   . Essential hypertension   . GERD (gastroesophageal reflux disease)   . Gout   . Ischemic cardiomyopathy    a. EF 35-40% by cath 07/2013.  . Mixed hyperlipidemia   . Sinus bradycardia   . Type 2 diabetes mellitus ([place])     Past Surgical History:  Procedure Laterality Date   . CARDIAC CATHETERIZATION  06/19/2014   Procedure: CORONARY STENT INTERVENTION;  Surgeon: Leonie Man, MD;  Location: [facility] CATH LAB;  Service: Cardiovascular;;  DES to seg SVG Diag/OM/Ramus   . CARDIAC CATHETERIZATION N/A 11/22/2015   Procedure: Left Heart Cath and Coronary Angiography;  Surgeon: Belva Crome, MD;  Location: [place] CV LAB;  Service: Cardiovascular;  Laterality: N/A;  . CHOLECYSTECTOMY    . CORONARY ARTERY BYPASS GRAFT     2003, LIMA to LAD; SVG to diagonal; SVG to OM, ramus, and diagonal; SVG to RCA  . LEFT HEART CATHETERIZATION WITH CORONARY ANGIOGRAM N/A 01/28/2015   Procedure: LEFT HEART CATHETERIZATION WITH CORONARY ANGIOGRAM;  Surgeon: Leonie Man, MD;  Location: [facility] CATH LAB;  Service: Cardiovascular;  Laterality: N/A;  . LEFT HEART CATHETERIZATION WITH CORONARY/GRAFT ANGIOGRAM N/A 07/14/2013   Procedure: LEFT HEART CATHETERIZATION WITH Beatrix Fetters;  Surgeon: Burnell Blanks, MD;  Location: [facility] CATH LAB;  Service: Cardiovascular;  Laterality: N/A;  . LEFT HEART CATHETERIZATION WITH CORONARY/GRAFT ANGIOGRAM N/A 12/29/2013   Procedure: LEFT HEART CATHETERIZATION WITH Beatrix Fetters;  Surgeon: Leonie Man, MD;  Location: [facility] CATH LAB;  Service: Cardiovascular;  Laterality: N/A;  . LEFT HEART CATHETERIZATION WITH CORONARY/GRAFT ANGIOGRAM N/A 06/19/2014   Procedure: LEFT HEART CATHETERIZATION WITH Beatrix Fetters;  Surgeon: Leonie Man, MD;  Location: [facility] CATH LAB;  Service: Cardiovascular;  Laterality: N/A;  . Metal plate to chin    .  PERCUTANEOUS CORONARY STENT INTERVENTION (PCI-S)  07/14/2013   Procedure: PERCUTANEOUS CORONARY STENT INTERVENTION (PCI-S);  Surgeon: Burnell Blanks, MD;  Location: [facility] CATH LAB;  Service: Cardiovascular;;  . Pin placement left leg      Current Outpatient Medications  Medication Sig Dispense Refill  . acetaminophen (TYLENOL) 325 MG tablet Take 2 tablets (650 mg total) by mouth every 4  (four) hours as needed for headache or mild pain.    Marland Kitchen albuterol (PROVENTIL HFA;VENTOLIN HFA) 108 (90 BASE) MCG/ACT inhaler Inhale 2 puffs into the lungs every 6 (six) hours as needed for wheezing or shortness of breath.    . allopurinol (ZYLOPRIM) 300 MG tablet Take 300 mg by mouth daily.  0  . aspirin EC 81 MG tablet Take 1 tablet (81 mg total) by mouth daily.    . Blood Pressure Monitoring KIT 1 each by Does not apply route as directed. 1 kit 0  . carvedilol (COREG) 3.125 MG tablet TAKE ONE TABLET BY MOUTH 2 TIMES A DAY WITH A MEAL 180 tablet 1  . clopidogrel (PLAVIX) 75 MG tablet Take 1 tablet (75 mg total) by mouth daily. 90 tablet 1  . diazepam (VALIUM) 10 MG tablet Take 10 mg by mouth 2 (two) times daily.    Marland Kitchen FLOVENT HFA 220 MCG/ACT inhaler     . furosemide (LASIX) 20 MG tablet TAKE ONE TABLET BY MOUTH EVERY NIGHT AT BEDTIME 90 tablet 3  . HYSINGLA ER 30 MG T24A Take 1 tablet by mouth daily.  0  . isosorbide mononitrate (IMDUR) 60 MG 24 hr tablet TAKE ONE TABLET BY MOUTH 2 TIMES A DAY 180 tablet 0  . lisinopril (PRINIVIL,ZESTRIL) 2.5 MG tablet Take 1 tablet (2.5 mg total) by mouth daily. 90 tablet 1  . metFORMIN (GLUCOPHAGE) 1000 MG tablet Take 1 tablet (1,000 mg total) by mouth 2 (two) times daily with a meal.    . nitroGLYCERIN (NITROSTAT) 0.4 MG SL tablet PLACE 1 TABLET UNDER THE TONGUE AS NEEDED FOR CHEST PAIN. MAY REPEAT EVERY 5 MINUTES UP TO 3 DOSES. IF NO RELIEF AFTER 3 DOSES, CALL 911 OR 25 tablet 0  . Omega-3 Fatty Acids (FISH OIL) 1000 MG CAPS Take 1,000-2,000 mg by mouth 2 (two) times daily. 1 IN THE MORNING AND 2 AT BEDTIME    . ONETOUCH VERIO test strip     . pantoprazole (PROTONIX) 40 MG tablet Take 40 mg by mouth daily.     . pravastatin (PRAVACHOL) 80 MG tablet Take 1 tablet (80 mg total) by mouth every evening. 90 tablet 1  . ranolazine (RANEXA) 1000 MG SR tablet TAKE ONE TABLET BY MOUTH 2 TIMES A DAY 180 tablet 1  . Varenicline Tartrate (CHANTIX PO) Take by mouth.      No current facility-administered medications for this visit.    Allergies:  Patient has no known allergies.   Social History: The patient  reports that he has been smoking cigarettes. He started smoking about 40 years ago. He has a 26.40 pack-year smoking history. He has never used smokeless tobacco. He reports that he has current or past drug history. Drug: Marijuana. Frequency: 2.00 times per week. He reports that he does not drink alcohol.   ROS:  Please see the history of present illness. Otherwise, complete review of systems is positive for fatigue.  All other systems are reviewed and negative.   Physical Exam: VS:  BP 102/80   Pulse 71   Ht 5' 4"  (1.626 m)  Wt 170 lb (77.1 kg)   SpO2 98%   BMI 29.18 kg/m , BMI Body mass index is 29.18 kg/m.  Wt Readings from Last 3 Encounters:  07/12/18 170 lb (77.1 kg)  06/07/18 186 lb (84.4 kg)  11/23/17 198 lb (89.8 kg)    General: Chronically ill-appearing male, no distress. HEENT: Conjunctiva and lids normal, oropharynx clear. Neck: Supple, no elevated JVP or carotid bruits, no thyromegaly. Lungs: Clear to auscultation, nonlabored breathing at rest. Cardiac: Regular rate and rhythm, no S3 or significant systolic murmur. Abdomen: Soft, nontender, bowel sounds present. Extremities: No pitting edema, distal pulses 2+. Skin: Warm and dry. Musculoskeletal: No kyphosis. Neuropsychiatric: Alert and oriented x3, affect grossly appropriate.  ECG: I personally reviewed the tracing from 04/20/2017 which showed sinus rhythm with nonspecific T wave changes.  Recent Labwork:  February 2019: Cholesterol 146, triglycerides 98, HDL 62, LDL 64, BUN 15, creatinine 0.95, potassium 5.0, hemoglobin A1c 5.2  Other Studies Reviewed Today:  Cardiac catheterization 11/22/2015: 1. Ramus lesion, 95% stenosed. 2. Saphenous vein bypass graft disease with total occlusion of the SVG to the distal RCA. The sequential SVG to the diagonal and ramus intermedius  is disease with occlusion of the limb to the ramus intermedius. The side-to-side anastomosis on the diagonal remains patent. The stents in the ostium and distal saphenous vein graft sites are widely patent. 3. Patent LIMA to LAD 4. Total occlusion of the native right. The distal right coronary/PDA fills byleft-to-right collaterals from the LAD. Segmental diffuse disease in the left main. Total occlusion of the LAD. High-grade obstruction in the circumflex and ramus intermedius originating at the left main. 5. Widely patent LIMA to the LAD  When compared to the prior angiographic images, no significant change has occurred. Inferior wall hypokinesis, estimated ejection fraction 45%  Assessment and Plan:  1.  Multivessel CAD status post CABG with graft disease and subsequent PCI.  Cardiac catheterization from 2017 as outlined above.  We have continued medical therapy.  No changes were made today.  ECG reviewed.  If symptoms escalate, we will reevaluate him sooner rather than later.  Otherwise continue routine follow-up.  2.  Long-standing tobacco abuse. The patient was counseled on tobacco cessation today for 4 minutes.  Counseling included reviewing the risks of smoking tobacco products, how it impacts the patient's current medical diagnoses and different strategies for quitting.  Pharmacotherapy to aid in tobacco cessation was not prescribed today.  He has tried Chantix.  3.  Ischemic cardiomyopathy with LVEF approximately 45%.  Continue with current regimen.  No evidence of fluid overload.  His weight is actually down.  4.  Mixed hyperlipidemia on Pravachol.  Following with PCP, last LDL 64.  Current medicines were reviewed with the patient today.   Orders Placed This Encounter  Procedures  . EKG 12-Lead    Disposition: Follow-up in 6 months, sooner if needed.  Signed, Satira Sark, MD, [facility] 07/12/2018 11:13 AM    [place] at [place], [place], [place] [address] Phone: [phone]; Fax: [phone]

## 2018-07-12 ENCOUNTER — Encounter: Payer: Self-pay | Admitting: Cardiology

## 2018-07-12 ENCOUNTER — Ambulatory Visit (INDEPENDENT_AMBULATORY_CARE_PROVIDER_SITE_OTHER): Payer: Medicare Other | Admitting: Cardiology

## 2018-07-12 VITALS — BP 102/80 | HR 71 | Ht 64.0 in | Wt 170.0 lb

## 2018-07-12 DIAGNOSIS — F17210 Nicotine dependence, cigarettes, uncomplicated: Secondary | ICD-10-CM | POA: Diagnosis not present

## 2018-07-12 DIAGNOSIS — E782 Mixed hyperlipidemia: Secondary | ICD-10-CM | POA: Diagnosis not present

## 2018-07-12 DIAGNOSIS — I25119 Atherosclerotic heart disease of native coronary artery with unspecified angina pectoris: Secondary | ICD-10-CM

## 2018-07-12 DIAGNOSIS — Z720 Tobacco use: Secondary | ICD-10-CM

## 2018-07-12 DIAGNOSIS — Z23 Encounter for immunization: Secondary | ICD-10-CM

## 2018-07-12 DIAGNOSIS — I255 Ischemic cardiomyopathy: Secondary | ICD-10-CM

## 2018-07-12 NOTE — Patient Instructions (Addendum)

## 2018-08-07 ENCOUNTER — Other Ambulatory Visit: Payer: Self-pay | Admitting: Cardiology

## 2018-08-12 ENCOUNTER — Other Ambulatory Visit: Payer: Self-pay | Admitting: Cardiology

## 2018-08-20 DIAGNOSIS — Z86010 Personal history of colonic polyps: Secondary | ICD-10-CM | POA: Diagnosis not present

## 2018-08-26 DIAGNOSIS — Z7982 Long term (current) use of aspirin: Secondary | ICD-10-CM | POA: Diagnosis not present

## 2018-08-26 DIAGNOSIS — E119 Type 2 diabetes mellitus without complications: Secondary | ICD-10-CM | POA: Diagnosis not present

## 2018-08-26 DIAGNOSIS — J449 Chronic obstructive pulmonary disease, unspecified: Secondary | ICD-10-CM | POA: Diagnosis not present

## 2018-08-26 DIAGNOSIS — Z7984 Long term (current) use of oral hypoglycemic drugs: Secondary | ICD-10-CM | POA: Diagnosis not present

## 2018-08-26 DIAGNOSIS — K219 Gastro-esophageal reflux disease without esophagitis: Secondary | ICD-10-CM | POA: Diagnosis not present

## 2018-08-26 DIAGNOSIS — Z9049 Acquired absence of other specified parts of digestive tract: Secondary | ICD-10-CM | POA: Diagnosis not present

## 2018-08-26 DIAGNOSIS — Z1211 Encounter for screening for malignant neoplasm of colon: Secondary | ICD-10-CM | POA: Diagnosis not present

## 2018-08-26 DIAGNOSIS — I10 Essential (primary) hypertension: Secondary | ICD-10-CM | POA: Diagnosis not present

## 2018-08-26 DIAGNOSIS — Z951 Presence of aortocoronary bypass graft: Secondary | ICD-10-CM | POA: Diagnosis not present

## 2018-08-26 DIAGNOSIS — I252 Old myocardial infarction: Secondary | ICD-10-CM | POA: Diagnosis not present

## 2018-08-26 DIAGNOSIS — M109 Gout, unspecified: Secondary | ICD-10-CM | POA: Diagnosis not present

## 2018-08-26 DIAGNOSIS — Z7902 Long term (current) use of antithrombotics/antiplatelets: Secondary | ICD-10-CM | POA: Diagnosis not present

## 2018-08-26 DIAGNOSIS — Z79899 Other long term (current) drug therapy: Secondary | ICD-10-CM | POA: Diagnosis not present

## 2018-08-26 DIAGNOSIS — Z955 Presence of coronary angioplasty implant and graft: Secondary | ICD-10-CM | POA: Diagnosis not present

## 2018-08-26 DIAGNOSIS — G4730 Sleep apnea, unspecified: Secondary | ICD-10-CM | POA: Diagnosis not present

## 2018-08-26 DIAGNOSIS — I2510 Atherosclerotic heart disease of native coronary artery without angina pectoris: Secondary | ICD-10-CM | POA: Diagnosis not present

## 2018-08-29 DIAGNOSIS — E11319 Type 2 diabetes mellitus with unspecified diabetic retinopathy without macular edema: Secondary | ICD-10-CM | POA: Diagnosis not present

## 2018-09-04 DIAGNOSIS — E1142 Type 2 diabetes mellitus with diabetic polyneuropathy: Secondary | ICD-10-CM | POA: Diagnosis not present

## 2018-09-04 DIAGNOSIS — I10 Essential (primary) hypertension: Secondary | ICD-10-CM | POA: Diagnosis not present

## 2018-10-07 ENCOUNTER — Telehealth: Payer: Self-pay | Admitting: Cardiology

## 2018-10-07 MED ORDER — RANOLAZINE ER 1000 MG PO TB12: ORAL_TABLET | ORAL | 1 refills | Status: DC

## 2018-10-07 NOTE — Telephone Encounter (Signed)
°*  STAT* If patient is at the pharmacy, call can be transferred to refill team.   1. Which medications need to be refilled? ranolazine (RANEXA) 1000 MG SR tablet     2. Which pharmacy/location (including street and city if local pharmacy) is medication to be sent to? [facility]  3. Do they need a 30 day or 90 day supply?

## 2018-10-07 NOTE — Telephone Encounter (Signed)
DONE

## 2018-10-08 ENCOUNTER — Other Ambulatory Visit: Payer: Self-pay | Admitting: Cardiology

## 2018-10-26 DIAGNOSIS — Z7984 Long term (current) use of oral hypoglycemic drugs: Secondary | ICD-10-CM | POA: Diagnosis not present

## 2018-10-26 DIAGNOSIS — I10 Essential (primary) hypertension: Secondary | ICD-10-CM | POA: Diagnosis not present

## 2018-10-26 DIAGNOSIS — E119 Type 2 diabetes mellitus without complications: Secondary | ICD-10-CM | POA: Diagnosis not present

## 2018-10-26 DIAGNOSIS — J449 Chronic obstructive pulmonary disease, unspecified: Secondary | ICD-10-CM | POA: Diagnosis not present

## 2018-10-26 DIAGNOSIS — Z7982 Long term (current) use of aspirin: Secondary | ICD-10-CM | POA: Diagnosis not present

## 2018-10-26 DIAGNOSIS — K219 Gastro-esophageal reflux disease without esophagitis: Secondary | ICD-10-CM | POA: Diagnosis not present

## 2018-10-26 DIAGNOSIS — Z7902 Long term (current) use of antithrombotics/antiplatelets: Secondary | ICD-10-CM | POA: Diagnosis not present

## 2018-10-26 DIAGNOSIS — Z79899 Other long term (current) drug therapy: Secondary | ICD-10-CM | POA: Diagnosis not present

## 2018-10-26 DIAGNOSIS — K760 Fatty (change of) liver, not elsewhere classified: Secondary | ICD-10-CM | POA: Diagnosis not present

## 2018-10-26 DIAGNOSIS — I252 Old myocardial infarction: Secondary | ICD-10-CM | POA: Diagnosis not present

## 2018-10-26 DIAGNOSIS — R918 Other nonspecific abnormal finding of lung field: Secondary | ICD-10-CM | POA: Diagnosis not present

## 2018-10-26 DIAGNOSIS — R109 Unspecified abdominal pain: Secondary | ICD-10-CM | POA: Diagnosis not present

## 2018-10-26 DIAGNOSIS — I2510 Atherosclerotic heart disease of native coronary artery without angina pectoris: Secondary | ICD-10-CM | POA: Diagnosis not present

## 2018-10-26 DIAGNOSIS — K529 Noninfective gastroenteritis and colitis, unspecified: Secondary | ICD-10-CM | POA: Diagnosis not present

## 2018-10-31 DIAGNOSIS — K5741 Diverticulitis of both small and large intestine with perforation and abscess with bleeding: Secondary | ICD-10-CM | POA: Diagnosis not present

## 2018-11-07 ENCOUNTER — Other Ambulatory Visit: Payer: Self-pay | Admitting: Cardiology

## 2018-11-21 ENCOUNTER — Encounter: Payer: Self-pay | Admitting: *Deleted

## 2018-11-21 ENCOUNTER — Other Ambulatory Visit: Payer: Self-pay | Admitting: *Deleted

## 2018-11-21 ENCOUNTER — Telehealth: Payer: Self-pay | Admitting: Cardiology

## 2018-11-21 MED ORDER — RANOLAZINE ER 1000 MG PO TB12: 500.0000 mg | ORAL_TABLET | Freq: Two times a day (BID) | ORAL | 2 refills | Status: DC

## 2018-11-21 NOTE — Telephone Encounter (Signed)
Pt wanted to make sure that Ranexa was sent to [facility] as generic - pt aware that ranolazine was sent in today

## 2018-11-21 NOTE — Telephone Encounter (Signed)
Patient is calling to speak with someone in regards to medication ranolazine (RANEXA) 1000 MG SR tablet  he is requesting to be called on [phone].

## 2018-12-04 ENCOUNTER — Telehealth: Payer: Self-pay | Admitting: Cardiology

## 2018-12-04 DIAGNOSIS — H9319 Tinnitus, unspecified ear: Secondary | ICD-10-CM

## 2018-12-04 NOTE — Telephone Encounter (Signed)
Patient called stating that last time he went to [facility] hospital that if he bumped his head he was to go to the ER. He cannot remember which medication this was. States that he continues to have ringing in his ears.

## 2018-12-05 NOTE — Telephone Encounter (Signed)
Patient notified.  Will put referral in for Dr. Suszanne Conners for the [facility] location.  Informed patient that this office may not take the referral from [place] as he has [facility] Medicare & Medicaid.  Sometimes the pmd will have to do the referral, but not sure since you have both insurances.  Patient verbalized understanding & stated that he does has f/u with his pmd 12/12/2018.

## 2018-12-05 NOTE — Telephone Encounter (Signed)
Stated that he has been having the ringing in his ears for several months.  Keeps mentioning to his pmd & he doesn't seem to do anything about it.  Did clean wax out of one ear, but the ringing continues.  Wants to know if provider will refer him to ENT.    Advised him to be cautious with the Plavix, as this may be what someone mentioned to him.  Explained to him that this medication is antiplatelet, not blood thinner; but can still increase his risk for bleed.

## 2018-12-05 NOTE — Telephone Encounter (Signed)
If he is reporting chronic tinnitus, ENT consultation would be reasonable.  We can make a referral if he would like.

## 2018-12-12 DIAGNOSIS — I10 Essential (primary) hypertension: Secondary | ICD-10-CM | POA: Diagnosis not present

## 2018-12-12 DIAGNOSIS — I5032 Chronic diastolic (congestive) heart failure: Secondary | ICD-10-CM | POA: Diagnosis not present

## 2018-12-12 DIAGNOSIS — I739 Peripheral vascular disease, unspecified: Secondary | ICD-10-CM | POA: Diagnosis not present

## 2018-12-12 DIAGNOSIS — E119 Type 2 diabetes mellitus without complications: Secondary | ICD-10-CM | POA: Diagnosis not present

## 2018-12-12 DIAGNOSIS — Z0000 Encounter for general adult medical examination without abnormal findings: Secondary | ICD-10-CM | POA: Diagnosis not present

## 2018-12-12 DIAGNOSIS — J449 Chronic obstructive pulmonary disease, unspecified: Secondary | ICD-10-CM | POA: Diagnosis not present

## 2018-12-25 ENCOUNTER — Telehealth: Payer: Self-pay | Admitting: Cardiology

## 2018-12-25 NOTE — Telephone Encounter (Signed)
Returning someones call 

## 2018-12-25 NOTE — Telephone Encounter (Signed)
No documentation of anyone calling - f/u already scheduled with Dr Diona Browner in April

## 2019-01-07 ENCOUNTER — Telehealth: Payer: Self-pay | Admitting: *Deleted

## 2019-01-07 ENCOUNTER — Other Ambulatory Visit: Payer: Self-pay | Admitting: Cardiology

## 2019-01-07 ENCOUNTER — Encounter: Payer: Self-pay | Admitting: *Deleted

## 2019-01-07 NOTE — Telephone Encounter (Signed)
Pt reminded of appt with Dr Diona Browner on 4/2 via telephone. Reviewed pt medications. Pt says pcp did recent labs and showed cholesterol was higher than normal (will request) Chart reviewed with patient.  Patient reminded to have weight and vitals ready before the phone call begins.    The patient [facility]ally [facility] understands that his/her insurance company will be billedfor the encounter.

## 2019-01-07 NOTE — Telephone Encounter (Signed)
LM to return call.

## 2019-01-07 NOTE — Telephone Encounter (Signed)
Patient called returning a call to [facility].

## 2019-01-08 NOTE — Progress Notes (Signed)
Virtual Visit via Telephone Note    Evaluation Performed:  Follow-up visit  This visit type was conducted due to national recommendations for restrictions regarding the COVID-19 Pandemic (e.g. social distancing).  This format is felt to be most appropriate for this patient at this time.  All issues noted in this document were discussed and addressed.  No physical exam was performed (except for noted visual exam findings with Video Visits).  The patient gave verbal consent for a telehealth phone visit today with [facility] and understands that their insurance company will be billed for the encounter.   Date:  01/09/2019   ID:  [name] [name], DOB 06/06/1966, MRN [id number]  Patient Location:  [address] [place], [place] [address]  Provider location:   [place] at [place], [place], [place] [address] Phone: [phone]; Fax: [phone]  PCP:  [name], MD  Cardiologist:  [name], MD  Chief Complaint: Follow-up angina control  History of Present Illness:    [name] [name] is a 53 y.o. male who presents via audio conferencing for a telehealth visit today.  He was last seen in October 2019.  He states that he has had occasional, mild episodes of angina but that in most cases he has not had to use nitroglycerin.  Combination of Imdur and Ranexa has been effective.  He has been staying at his mobile home, only going out to shop for necessities.  He has been doing some walking outdoors.  He does tell me that since I saw him he had trouble with progressive weight loss and diarrhea, also intermittent abdominal pain.  He was apparently evaluated at [facility] in January of this year and diagnosed with colitis.  He completed a course of antibiotics.  He states that his symptoms have improved.  I reviewed his cardiac medications which are stable and outlined below.  He did have lab work done recently with Dr. Sherrie Sport, we are  requesting the results.  The patient does not have symptoms concerning for COVID-19 infection (fever, chills, cough, or new shortness of breath).  I talked with him about the importance of social distancing.   Prior CV studies:   The following studies were reviewed today:  Cardiac catheterization 11/22/2015: 1. Ramus lesion, 95% stenosed. 2. Saphenous vein bypass graft disease with total occlusion of the SVG to the distal RCA. The sequential SVG to the diagonal and ramus intermedius is disease with occlusion of the limb to the ramus intermedius. The side-to-side anastomosis on the diagonal remains patent. The stents in the ostium and distal saphenous vein graft sites are widely patent. 3. Patent LIMA to LAD 4. Total occlusion of the native right. The distal right coronary/PDA fills byleft-to-right collaterals from the LAD. Segmental diffuse disease in the left main. Total occlusion of the LAD. High-grade obstruction in the circumflex and ramus intermedius originating at the left main. 5. Widely patent LIMA to the LAD  When compared to the prior angiographic images, no significant change has occurred. Inferior wall hypokinesis, estimated ejection fraction 45%  ECG 07/12/2018: I personally reviewed the tracing which shows a sinus rhythm with old inferolateral infarct pattern, nonspecific T wave changes.  Past Medical History:  Diagnosis Date  . Asthma   . Bipolar disorder ([place])   . Chronic back pain   . Coronary atherosclerosis of native coronary artery    a. CABG 2003. b. NSTEMI 2010 s/p BMS to SVG-RCA c. PTCA/stent/asp  thromb to SVG-RCA 01/2010, PTCA to [place] 04/2010 d. known occlusion of SVG-RCA 2013. e. NSTEMI 07/2013  s/p PTCA/DES to SVG-diagonal/intermediate/OM1.   . Essential hypertension   . GERD (gastroesophageal reflux disease)   . Gout   . Ischemic cardiomyopathy    a. EF 35-40% by cath 07/2013.  . Mixed hyperlipidemia   . Sinus bradycardia   . Type 2 diabetes mellitus  ([place])    Past Surgical History:  Procedure Laterality Date  . CARDIAC CATHETERIZATION  06/19/2014   Procedure: CORONARY STENT INTERVENTION;  Surgeon: Leonie Man, MD;  Location: [facility] CATH LAB;  Service: Cardiovascular;;  DES to seg SVG Diag/OM/Ramus   . CARDIAC CATHETERIZATION N/A 11/22/2015   Procedure: Left Heart Cath and Coronary Angiography;  Surgeon: Belva Crome, MD;  Location: [place] CV LAB;  Service: Cardiovascular;  Laterality: N/A;  . CHOLECYSTECTOMY    . CORONARY ARTERY BYPASS GRAFT     2003, LIMA to LAD; SVG to diagonal; SVG to OM, ramus, and diagonal; SVG to RCA  . LEFT HEART CATHETERIZATION WITH CORONARY ANGIOGRAM N/A 01/28/2015   Procedure: LEFT HEART CATHETERIZATION WITH CORONARY ANGIOGRAM;  Surgeon: Leonie Man, MD;  Location: [facility] CATH LAB;  Service: Cardiovascular;  Laterality: N/A;  . LEFT HEART CATHETERIZATION WITH CORONARY/GRAFT ANGIOGRAM N/A 07/14/2013   Procedure: LEFT HEART CATHETERIZATION WITH Beatrix Fetters;  Surgeon: Burnell Blanks, MD;  Location: [facility] CATH LAB;  Service: Cardiovascular;  Laterality: N/A;  . LEFT HEART CATHETERIZATION WITH CORONARY/GRAFT ANGIOGRAM N/A 12/29/2013   Procedure: LEFT HEART CATHETERIZATION WITH Beatrix Fetters;  Surgeon: Leonie Man, MD;  Location: [facility] CATH LAB;  Service: Cardiovascular;  Laterality: N/A;  . LEFT HEART CATHETERIZATION WITH CORONARY/GRAFT ANGIOGRAM N/A 06/19/2014   Procedure: LEFT HEART CATHETERIZATION WITH Beatrix Fetters;  Surgeon: Leonie Man, MD;  Location: [facility] CATH LAB;  Service: Cardiovascular;  Laterality: N/A;  . Metal plate to chin    . PERCUTANEOUS CORONARY STENT INTERVENTION (PCI-S)  07/14/2013   Procedure: PERCUTANEOUS CORONARY STENT INTERVENTION (PCI-S);  Surgeon: Burnell Blanks, MD;  Location: [facility] CATH LAB;  Service: Cardiovascular;;  . Pin placement left leg       Current Meds  Medication Sig  . acetaminophen (TYLENOL) 325 MG tablet Take 2 tablets (650  mg total) by mouth every 4 (four) hours as needed for headache or mild pain.  Marland Kitchen albuterol (PROVENTIL HFA;VENTOLIN HFA) 108 (90 BASE) MCG/ACT inhaler Inhale 2 puffs into the lungs every 6 (six) hours as needed for wheezing or shortness of breath.  . allopurinol (ZYLOPRIM) 300 MG tablet Take 300 mg by mouth daily.  Marland Kitchen aspirin EC 81 MG tablet Take 1 tablet (81 mg total) by mouth daily.  . Blood Pressure Monitoring KIT 1 each by Does not apply route as directed.  . carvedilol (COREG) 3.125 MG tablet TAKE ONE TABLET BY MOUTH 2 TIMES A DAY WITH A MEAL  . clopidogrel (PLAVIX) 75 MG tablet TAKE ONE TABLET BY MOUTH DAILY  . diazepam (VALIUM) 10 MG tablet Take 10 mg by mouth 2 (two) times daily.  Marland Kitchen FLOVENT HFA 220 MCG/ACT inhaler   . furosemide (LASIX) 20 MG tablet TAKE ONE TABLET BY MOUTH EVERY NIGHT AT BEDTIME  . HYSINGLA ER 30 MG T24A Take 1 tablet by mouth daily.  . isosorbide mononitrate (IMDUR) 60 MG 24 hr tablet TAKE ONE TABLET BY MOUTH 2 TIMES A DAY  . lisinopril (PRINIVIL,ZESTRIL) 2.5 MG tablet TAKE ONE TABLET BY MOUTH DAILY  . Magnesium 200 MG TABS  Take by mouth 2 (two) times daily.  . metFORMIN (GLUCOPHAGE) 1000 MG tablet Take 1 tablet (1,000 mg total) by mouth 2 (two) times daily with a meal.  . nitroGLYCERIN (NITROSTAT) 0.4 MG SL tablet PLACE 1 TABLET UNDER THE TONGUE AS NEEDED FOR CHEST PAIN. MAY REPEAT EVERY 5 MINUTES UP TO 3 DOSES. IF NO RELIEF AFTER 3 DOSES, CALL 911 OR  . Omega-3 Fatty Acids (FISH OIL) 1000 MG CAPS Take 1,000-2,000 mg by mouth 2 (two) times daily. 1 IN THE MORNING AND 2 AT BEDTIME  . ONETOUCH VERIO test strip   . pantoprazole (PROTONIX) 40 MG tablet Take 40 mg by mouth daily.   . pravastatin (PRAVACHOL) 80 MG tablet TAKE ONE TABLET BY MOUTH EVERY EVENING  . ranolazine (RANEXA) 1000 MG SR tablet Take 1 tablet (1,000 mg total) by mouth 2 (two) times daily.  . Varenicline Tartrate (CHANTIX PO) Take by mouth.     Allergies:   Patient has no known allergies.   Social  History   Tobacco Use  . Smoking status: Current Every Day Smoker    Packs/day: 0.80    Years: 33.00    Pack years: 26.40    Types: Cigarettes    Start date: 07/01/1978  . Smokeless tobacco: Never Used  . Tobacco comment: trying to quit, 3/4 pack per day. Started the nicotine patch on 01-10-14-   Substance Use Topics  . Alcohol use: No    Alcohol/week: 0.0 standard drinks    Comment: drink a can of beer occasionally every year   . Drug use: Yes    Frequency: 2.0 times per week    Types: Marijuana     Family Hx: The patient's family history includes Bowel Disease in his mother; Hernia in his father; Pneumonia in his daughter; Stroke in his unknown relative.  ROS:   Please see the history of present illness.    Chronic dyspnea on exertion, NYHA class II with most activities. All other systems reviewed and are negative.   Labs/Other Tests and Data Reviewed:    Recent Labs:  February 2019: Cholesterol 146, triglycerides 98, HDL 62, LDL 64, BUN 15, creatinine 0.95, potassium 5.0, hemoglobin A1c 5.2  Wt Readings from Last 3 Encounters:  07/12/18 170 lb (77.1 kg)  06/07/18 186 lb (84.4 kg)  11/23/17 198 lb (89.8 kg)     Objective:    Vital Signs:  There were no vitals taken for this visit.   He states that he had his blood pressure and heart rate checked recently with follow-up visit per PCP, cannot recall the details but states that he was told that it looked "good."  ASSESSMENT & PLAN:    1.  Multivessel CAD status post CABG with documented graft disease and subsequent PCI detailed above.  Fortunately, he remained stable in terms of angina control on present regimen.  We went over his medications and will plan to continue current course.  2.  Mixed hyperlipidemia, continues on Pravachol.  Requesting recent lab work from Dr. Sherrie Sport.  COVID-19 Education: The signs and symptoms of COVID-19 were discussed with the patient and how to seek care for testing (follow up with PCP  or arrange E-visit).  The importance of social distancing was discussed today.  Patient Risk:   After full review of this patient's clinical status, I feel that they are at least moderate risk at this time.  Time:   Today, I have spent 8 minutes with the patient with telehealth technology discussing cardiac  symptoms and medical therapy.   Medication Adjustments/Labs and Tests Ordered: Current medicines are reviewed at length with the patient today.  Concerns regarding medicines are outlined above.  Tests Ordered: No orders of the defined types were placed in this encounter.  Medication Changes: No orders of the defined types were placed in this encounter.   Disposition:  Follow up 6 months  Signed, [name], MD  01/09/2019 9:15 AM    [place]

## 2019-01-09 ENCOUNTER — Encounter: Payer: Self-pay | Admitting: *Deleted

## 2019-01-09 ENCOUNTER — Encounter: Payer: Self-pay | Admitting: Cardiology

## 2019-01-09 ENCOUNTER — Telehealth (INDEPENDENT_AMBULATORY_CARE_PROVIDER_SITE_OTHER): Payer: Medicare Other | Admitting: Cardiology

## 2019-01-09 ENCOUNTER — Ambulatory Visit: Payer: Medicare Other | Admitting: Cardiology

## 2019-01-09 DIAGNOSIS — I25119 Atherosclerotic heart disease of native coronary artery with unspecified angina pectoris: Secondary | ICD-10-CM

## 2019-01-09 DIAGNOSIS — E782 Mixed hyperlipidemia: Secondary | ICD-10-CM

## 2019-01-09 NOTE — Patient Instructions (Signed)

## 2019-02-07 IMAGING — DX DG ANKLE COMPLETE 3+V*L*
3 series · 3 of 3 positions shown · non-contrast
Comparison: 03/18/2018

CLINICAL DATA: Patient states he jumped down from a ladder 4 months
ago and has had left foot and ankle swelling since, patient states
he was treated at [facility]HOSPITAL[facility] for same and was diagnosed with ankle
sprain. Pain is now worse.

EXAM:
LEFT ANKLE COMPLETE - 3+ VIEW

[ankle ap]
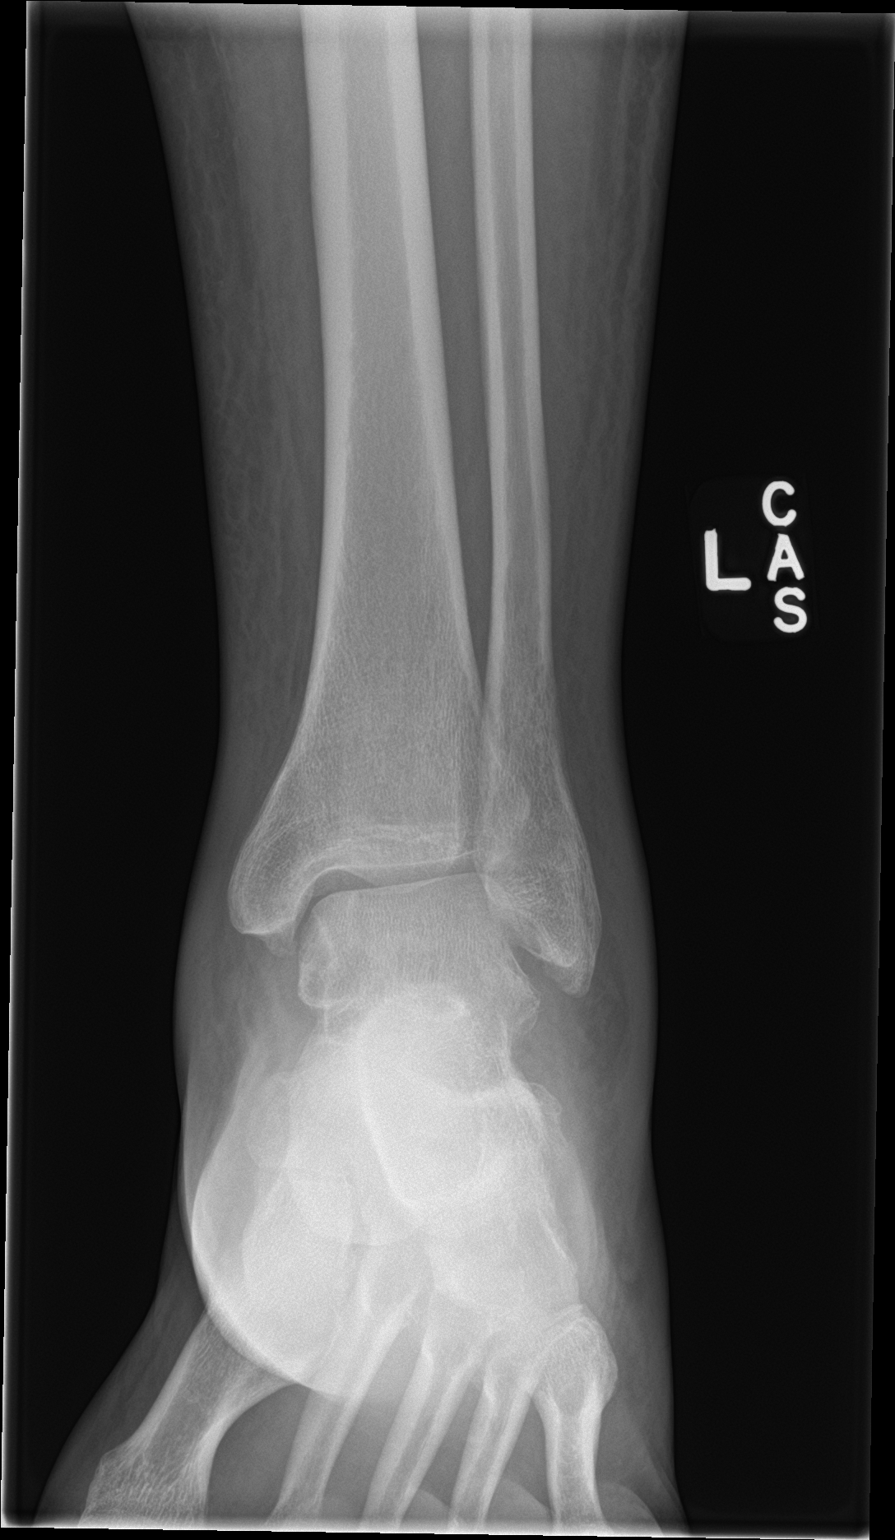

[ankle obl]
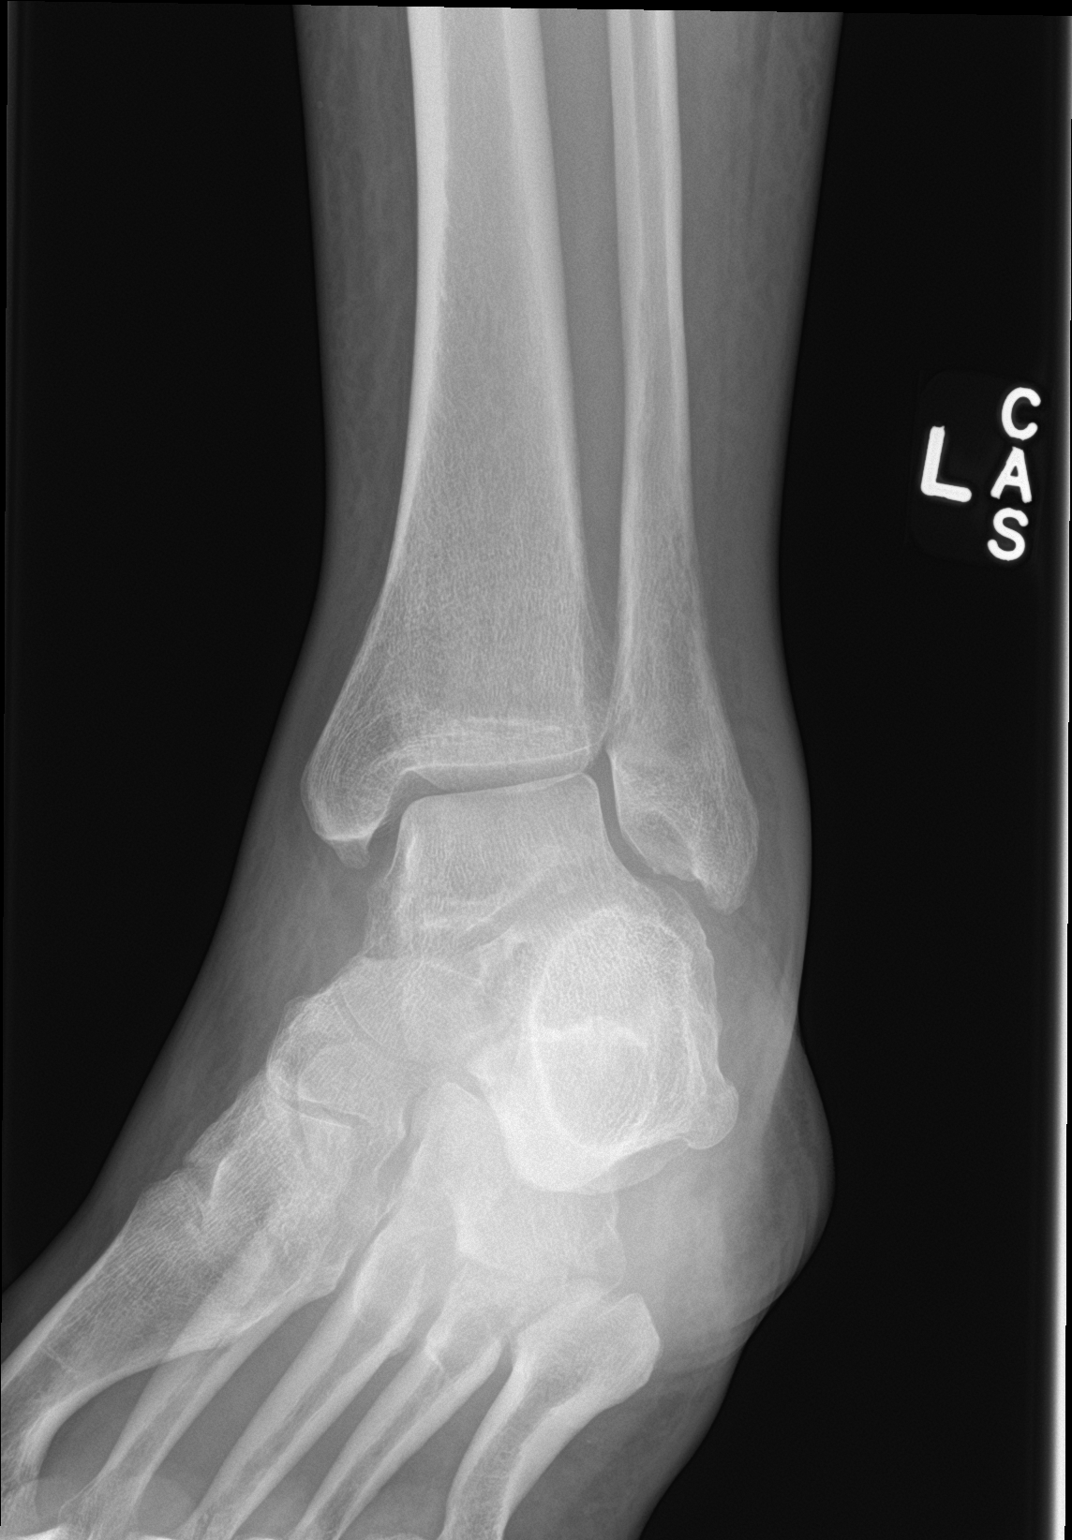

[ankle lat]
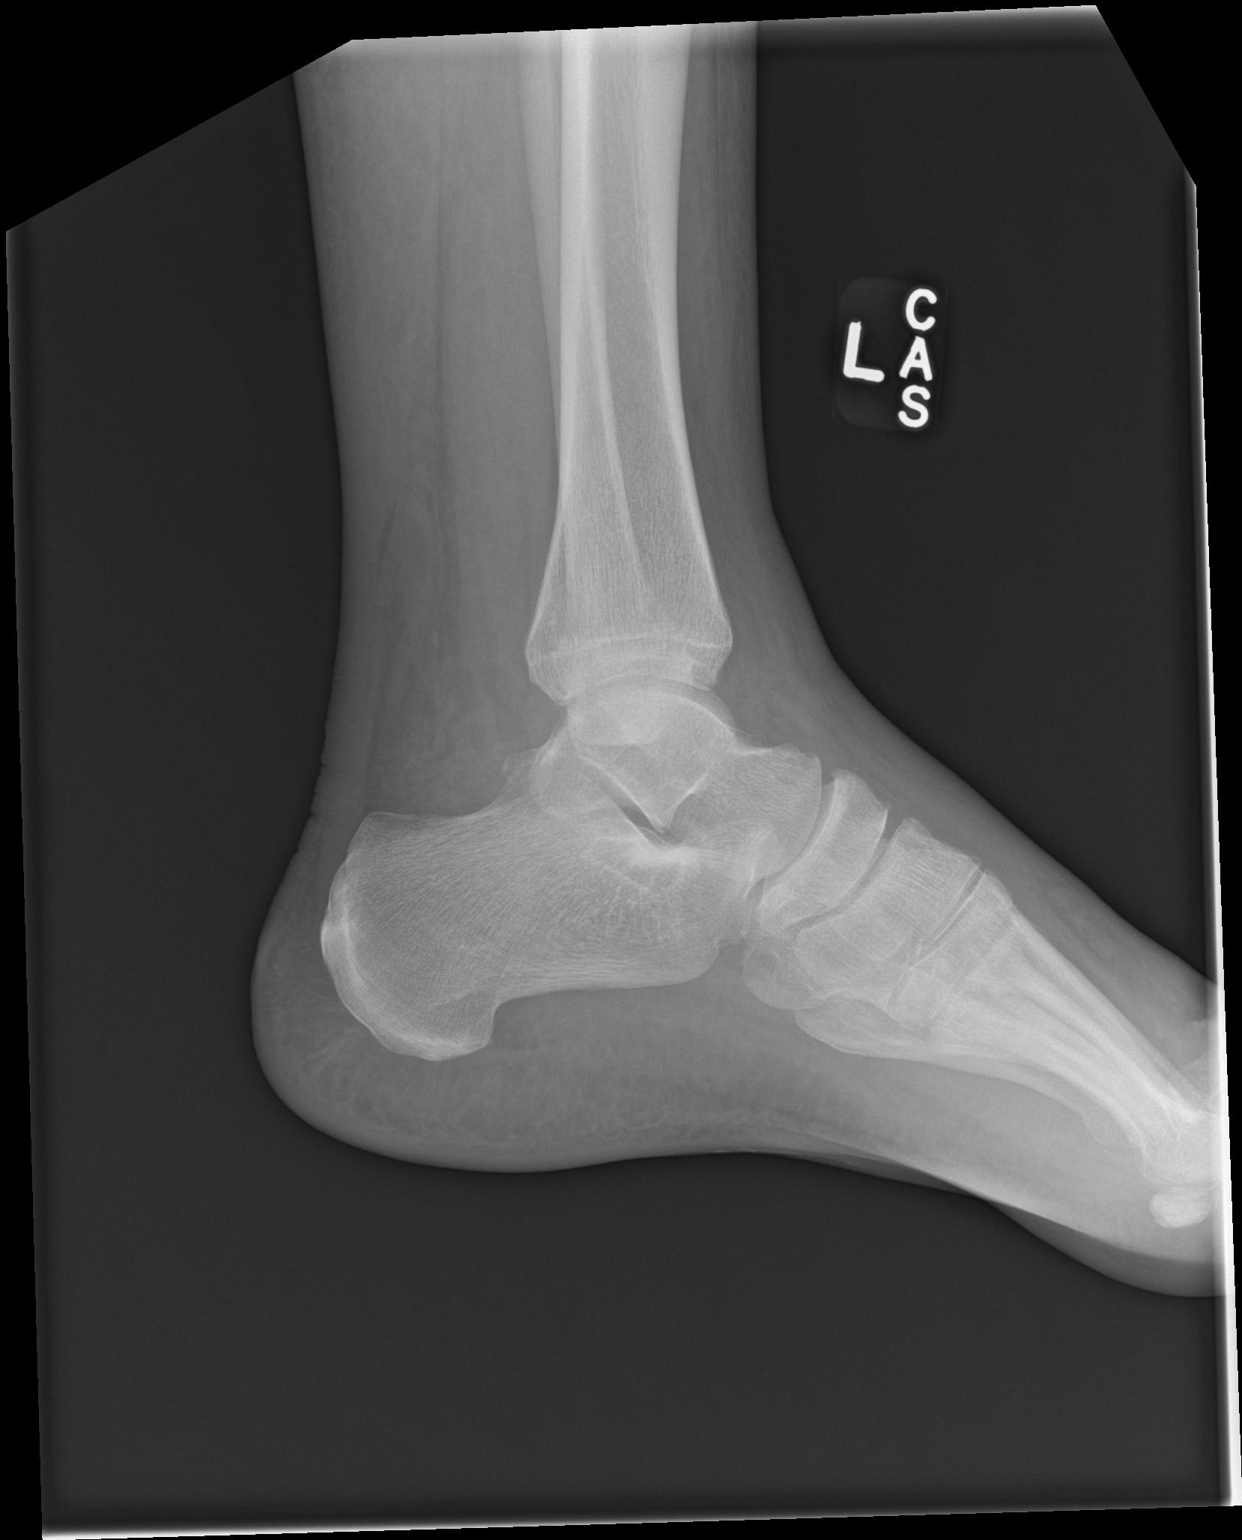

[3 of 3 positions shown; findings below may reference images not displayed]

FINDINGS: There is diffuse soft tissue swelling, increased compared to prior
study. No acute fracture or subluxation.
IMPRESSION: Soft tissue swelling.

## 2019-02-12 ENCOUNTER — Other Ambulatory Visit: Payer: Self-pay | Admitting: Cardiology

## 2019-02-17 DIAGNOSIS — A681 Tick-borne relapsing fever: Secondary | ICD-10-CM | POA: Diagnosis not present

## 2019-03-17 DIAGNOSIS — K210 Gastro-esophageal reflux disease with esophagitis: Secondary | ICD-10-CM | POA: Diagnosis not present

## 2019-03-17 DIAGNOSIS — J449 Chronic obstructive pulmonary disease, unspecified: Secondary | ICD-10-CM | POA: Diagnosis not present

## 2019-03-17 DIAGNOSIS — Z1389 Encounter for screening for other disorder: Secondary | ICD-10-CM | POA: Diagnosis not present

## 2019-03-17 DIAGNOSIS — Z0000 Encounter for general adult medical examination without abnormal findings: Secondary | ICD-10-CM | POA: Diagnosis not present

## 2019-03-17 DIAGNOSIS — A681 Tick-borne relapsing fever: Secondary | ICD-10-CM | POA: Diagnosis not present

## 2019-03-17 DIAGNOSIS — E119 Type 2 diabetes mellitus without complications: Secondary | ICD-10-CM | POA: Diagnosis not present

## 2019-04-08 ENCOUNTER — Other Ambulatory Visit: Payer: Self-pay

## 2019-04-08 NOTE — Patient Outreach (Addendum)
[place] [facility]) Care Management  04/08/2019  [name] [name] 21-Jun-1966 [id number]  Unsuccessful Outreach x 1.   Received referral from [place] on 04-02-2019 requesting to follow up with member due to HTN and DM.  Called member at preferred number and secondary number and no answer. HIPPA compliant voice mail message left introducing self and requested return call when available.   Will send unsuccessful outreach letter. Will call member within 3-4 business days.  Benjamine Mola "ANN" Josiah Lobo, RN-BSN  [facility] Care Management  Community Care Management Coordinator  [phone] [email or web address]

## 2019-04-15 ENCOUNTER — Other Ambulatory Visit: Payer: Self-pay

## 2019-04-15 NOTE — Patient Outreach (Signed)
[place] [facility]) Care Management  04/15/2019  B[place] Hantz Henery [birth year] [id number]  Unsuccessful Outreach x 2.   Received referral from [place] on 04-02-2019 requesting to follow up with member due to HTN and DM.  Called member at preferred number and secondary number and no answer. HIPPA compliant voice mail message left introducing self and requested return call when available.   Will call member within 3-4 business days.  Benjamine Mola "ANN" Josiah Lobo, RN-BSN  [facility] Care Management  Community Care Management Coordinator  [phone] [email or web address]

## 2019-04-18 ENCOUNTER — Other Ambulatory Visit: Payer: Self-pay

## 2019-04-18 NOTE — Patient Outreach (Signed)
[place] [facility]) Care Management  04/18/2019  [name] [name] Jun 21, 1966 [id number]  Unsuccessful Outreach x 3.   Received referral from [place] on 04-02-2019 requesting to follow up with member due to HTN and DM.  Called member at preferred numberand secondary numberand no answer. HIPPA compliant voice mail messages left introducing self and requested return call when available.   Will place on 10 day hold according to [facility] workflow.  [name] [name] "ANN" Josiah Lobo, RN-BSN  [facility] Care Management  Community Care Management Coordinator  [phone] [email or web address]

## 2019-04-22 NOTE — Patient Outreach (Addendum)
[place] [facility]) Care Management  04/22/2019  [name] [name] 1965-11-03 [id number]   Case Closure.  Unsuccessful Outreach x 3 and placed on hold for 10 business days according to [facility] workflow.   Will close case. Will send Physician case closure letter. No other [facility] disciplines involved in care at this time.   Benjamine Mola "ANN" Josiah Lobo, RN-BSN  [facility] Care Management  Community Care Management Coordinator  [phone] [email or web address]

## 2019-05-06 ENCOUNTER — Other Ambulatory Visit: Payer: Self-pay | Admitting: Cardiology

## 2019-06-17 DIAGNOSIS — E119 Type 2 diabetes mellitus without complications: Secondary | ICD-10-CM | POA: Diagnosis not present

## 2019-06-17 DIAGNOSIS — K210 Gastro-esophageal reflux disease with esophagitis: Secondary | ICD-10-CM | POA: Diagnosis not present

## 2019-06-17 DIAGNOSIS — J449 Chronic obstructive pulmonary disease, unspecified: Secondary | ICD-10-CM | POA: Diagnosis not present

## 2019-08-05 ENCOUNTER — Other Ambulatory Visit: Payer: Self-pay | Admitting: Cardiology

## 2019-08-26 ENCOUNTER — Encounter: Payer: Self-pay | Admitting: *Deleted

## 2019-08-27 ENCOUNTER — Encounter: Payer: Self-pay | Admitting: Cardiology

## 2019-08-27 ENCOUNTER — Encounter: Payer: Self-pay | Admitting: *Deleted

## 2019-08-27 ENCOUNTER — Telehealth (INDEPENDENT_AMBULATORY_CARE_PROVIDER_SITE_OTHER): Payer: Medicare Other | Admitting: Family Medicine

## 2019-08-27 VITALS — Ht 64.0 in

## 2019-08-27 DIAGNOSIS — I25700 Atherosclerosis of coronary artery bypass graft(s), unspecified, with unstable angina pectoris: Secondary | ICD-10-CM

## 2019-08-27 DIAGNOSIS — E782 Mixed hyperlipidemia: Secondary | ICD-10-CM

## 2019-08-27 DIAGNOSIS — I255 Ischemic cardiomyopathy: Secondary | ICD-10-CM

## 2019-08-27 DIAGNOSIS — I10 Essential (primary) hypertension: Secondary | ICD-10-CM

## 2019-08-27 NOTE — Progress Notes (Signed)
Virtual Visit via Telephone Note   This visit type was conducted due to national recommendations for restrictions regarding the COVID-19 Pandemic (e.g. social distancing) in an effort to limit this patient's exposure and mitigate transmission in our community.  Due to his co-morbid illnesses, this patient is at least at moderate risk for complications without adequate follow up.  This format is felt to be most appropriate for this patient at this time.  The patient did not have access to video technology/had technical difficulties with video requiring transitioning to audio format only (telephone).  All issues noted in this document were discussed and addressed.  No physical exam could be performed with this format.  Please refer to the patient's chart for his  consent to telehealth for [facility].   Date:  08/27/2019   ID:  [name] [name], DOB 08/25/1966, MRN [id number]  Patient Location: Home Provider Location: Office  PCP:  Toma DeitersHasanaj, [name], MD  Cardiologist:  Nona DellSamuel McDowell, MD  Electrophysiologist:  None   Evaluation Performed:  Follow-Up Visit  Chief Complaint: Follow-up coronary artery disease.  History of Present Illness:    [name] ArenasSteven D Sagan is a 53 y.o. male with last visit on July 12, 2018.  Patient reported some episodes of chest pain and dyspnea which resolved with sublingual nitroglycerin at that time.  He reported compliance with all antianginal medications.  He reported an episode last year of a vasovagal syncope episode.  Denies any current episodes of syncope, recent acute illnesses, hospitalizations, or travels.  He is a long-term smoker and not able to quit completely.  He tried Chantix in the past, however, was still unable to stop smoking.  States he continues to smoke at least 1 pack of cigarettes per day.  Current cardiac medications include aspirin 81 mg, carvedilol 3.125 mg twice daily, Plavix 75 mg, Lasix 20 mg, isosorbide mononitrate, 60 mg daily,  lisinopril 2.5 mg daily, sublingual nitroglycerin 0.4 as needed, Ranexa 1000 mg p.o. twice daily.  Last stress test in 2015 showed inferior lateral defect consistent with scarring and mild to moderate periinfarct ischemia.  Global left ventricular hypokinesis, most prominent in the inferior wall.  Left ventricular ejection fraction 40%.  Intermediate risk stress findings.  Patient states he saw his primary care provider around 6 months ago and had lab work.  Patient states the only abnormal results were his triglycerides.  We will obtain those results from PCP.  Patient states he had a colonoscopy in the interim since last visit which was normal.  The patient does not have symptoms concerning for COVID-19 infection (fever, chills, cough, or new shortness of breath).    Past Medical History:  Diagnosis Date   Asthma    Bipolar disorder (HCC)    Chronic back pain    Coronary atherosclerosis of native coronary artery    a. CABG 2003. b. NSTEMI 2010 s/p BMS to SVG-RCA c. PTCA/stent/asp thromb to SVG-RCA 01/2010, PTCA to SVG-RCA 04/2010 d. known occlusion of SVG-RCA 2013. e. NSTEMI 07/2013  s/p PTCA/DES to SVG-diagonal/intermediate/OM1.    Essential hypertension    GERD (gastroesophageal reflux disease)    Gout    Ischemic cardiomyopathy    a. EF 35-40% by cath 07/2013.   Mixed hyperlipidemia    Sinus bradycardia    Type 2 diabetes mellitus [facility](HCC)    Past Surgical History:  Procedure Laterality Date   CARDIAC CATHETERIZATION  06/19/2014   Procedure: CORONARY STENT INTERVENTION;  Surgeon: Marykay Lexavid W Harding, MD;  [facility]  C[facility] LAB;  Service: Cardiovascular;;  DES to seg SVG Diag/OM/Ramus    CARDIAC CATHETERIZATION N/A 11/22/2015   Procedure: Left Heart Cath and Coronary Angiography;  Surgeon: Belva Crome, MD;  Location: [place] CV LAB;  Service: Cardiovascular;  Laterality: N/A;   CHOLECYSTECTOMY     CORONARY ARTERY BYPASS GRAFT     2003, LIMA to LAD; SVG to diagonal;  SVG to OM, ramus, and diagonal; SVG to RCA   LEFT HEART CATHETERIZATION WITH CORONARY ANGIOGRAM N/A 01/28/2015   Procedure: LEFT HEART CATHETERIZATION WITH CORONARY ANGIOGRAM;  Surgeon: Leonie Man, MD;  Location[facility] CATH LAB;  Service: Cardiovascular;  Laterality: N/A;   LEFT HEART CATHETERIZATION WITH CORONARY/GRAFT ANGIOGRAM N/A 07/14/2013   Procedure: LEFT HEART CATHETERIZATION WITH Beatrix Fetters;  Surgeon: Burnell Blanks, MD;  Location: [facility] CATH LAB;  Service: Cardiovascular;  Laterality: N/A;   LEFT HEART CATHETERIZATION WITH CORONARY/GRAFT ANGIOGRAM N/A 12/29/2013   Procedure: LEFT HEART CATHETERIZATION WITH Beatrix Fetters;  Surgeon: Leonie Man, MD;  Location: [facility] CATH LAB;  Service: Cardiovascular;  Laterality: N/A;   LEFT HEART CATHETERIZATION WITH CORONARY/GRAFT ANGIOGRAM N/A 06/19/2014   Procedure: LEFT HEART CATHETERIZATION WITH Beatrix Fetters;  Surgeon: Leonie Man, MD;  Location: [facility] CATH LAB;  Service: Cardiovascular;  Laterality: N/A;   Metal plate to chin     PERCUTANEOUS CORONARY STENT INTERVENTION (PCI-S)  07/14/2013   Procedure: PERCUTANEOUS CORONARY STENT INTERVENTION (PCI-S);  Surgeon: Burnell Blanks, MD;  Location: [facility] CATH LAB;  Service: Cardiovascular;;   Pin placement left leg       Current Meds  Medication Sig   acetaminophen (TYLENOL) 325 MG tablet Take 2 tablets (650 mg total) by mouth every 4 (four) hours as needed for headache or mild pain.   albuterol (PROVENTIL HFA;VENTOLIN HFA) 108 (90 BASE) MCG/ACT inhaler Inhale 2 puffs into the lungs every 6 (six) hours as needed for wheezing or shortness of breath.   allopurinol (ZYLOPRIM) 300 MG tablet Take 300 mg by mouth daily.   aspirin EC 81 MG tablet Take 1 tablet (81 mg total) by mouth daily.   carvedilol (COREG) 3.125 MG tablet TAKE ONE TABLET BY MOUTH 2 TIMES A DAY WITH A MEAL   clopidogrel (PLAVIX) 75 MG tablet TAKE ONE TABLET BY MOUTH DAILY    diazepam (VALIUM) 10 MG tablet Take 10 mg by mouth 2 (two) times daily.   FLOVENT HFA 220 MCG/ACT inhaler Inhale 1 puff into the lungs daily.    furosemide (LASIX) 20 MG tablet TAKE ONE TABLET BY MOUTH EVERY NIGHT AT BEDTIME   isosorbide mononitrate (IMDUR) 60 MG 24 hr tablet TAKE ONE TABLET BY MOUTH 2 TIMES A DAY   lisinopril (PRINIVIL,ZESTRIL) 2.5 MG tablet TAKE ONE TABLET BY MOUTH DAILY   Magnesium 200 MG TABS Take by mouth 2 (two) times daily.   metFORMIN (GLUCOPHAGE) 1000 MG tablet Take 1 tablet (1,000 mg total) by mouth 2 (two) times daily with a meal.   nitroGLYCERIN (NITROSTAT) 0.4 MG SL tablet PLACE 1 TABLET UNDER THE TONGUE AS NEEDED FOR CHEST PAIN. MAY REPEAT EVERY 5 MINUTES UP TO 3 DOSES. IF NO RELIEF AFTER 3 DOSES, CALL 911 OR   omega-3 acid ethyl esters (LOVAZA) 1 g capsule Take 1 capsule by mouth every morning. & 2 capsules at night   ONETOUCH VERIO test strip    pantoprazole (PROTONIX) 40 MG tablet Take 40 mg by mouth daily.    pravastatin (PRAVACHOL) 80 MG tablet TAKE ONE TABLET BY MOUTH  EVERY EVENING   ranolazine (RANEXA) 1000 MG SR tablet Take 1 tablet (1,000 mg total) by mouth 2 (two) times daily.   [DISCONTINUED] Omega-3 Fatty Acids (FISH OIL) 1000 MG CAPS Take 1,000-2,000 mg by mouth 2 (two) times daily. 1 IN THE MORNING AND 2 AT BEDTIME     Allergies:   Patient has no known allergies.   Social History   Tobacco Use   Smoking status: Current Every Day Smoker    Packs/day: 0.80    Years: 33.00    Pack years: 26.40    Types: Cigarettes    Start date: 07/01/1978   Smokeless tobacco: Never Used   Tobacco comment: trying to quit, 3/4 pack per day. Started the nicotine patch on 01-10-14-   Substance Use Topics   Alcohol use: No    Alcohol/week: 0.0 standard drinks    Comment: drink a can of beer occasionally every year    Drug use: Yes    Frequency: 2.0 times per week    Types: Marijuana     Family Hx: The patient's family history includes Bowel  Disease in his mother; Hernia in his father; Pneumonia in his daughter; Stroke in an other family member.  ROS:   Please see the history of present illness.  Continues to complain of intermittent symptoms of chest pain and dyspnea relieved by sublingual nitroglycerin.  States this occurs more with exertion. All other systems reviewed and are negative.   Prior CV studies:   The following studies were reviewed today:  Echocardiogram December 30, 2013 Study Conclusions   Left ventricle: Challenging windows. The cavity size was  normal. Wall thickness was increased in a pattern of mild  LVH. Systolic function was normal. The estimated ejection  fraction was in the range of 55% to 60%. Although no  diagnostic regional wall motion abnormality was identified,  this possibility cannot be completely excluded on the basis  of this study. Transthoracic echocardiography.  M-mode, complete 2D, spectral Doppler, and color Doppler.  Height: Height: 160cm. Height: 63in. Weight: Weight:  99.8kg. Weight: 219.5lb. Body mass index: BMI: 39kg/m^2.  Body surface area:  BSA: 2.24m^2. Blood pressure:  114/73. Patient status: Inpatient. Location: Bedside.    Cardiac catheterization November 22, 2015 1. Ramus lesion, 95% stenosed.    Saphenous vein bypass graft disease with total occlusion of the SVG to the distal RCA. The sequential SVG to the diagonal and ramus intermedius is disease with occlusion of the limb to the ramus intermedius. The side-to-side anastomosis on the diagonal remains patent. The stents in the ostium and distal saphenous vein graft sites are widely patent.  Patent LIMA to LAD  Total occlusion of the native right. The distal right coronary/PDA fills by left-to-right collaterals from the LAD. Segmental diffuse disease in the left main. Total occlusion of the LAD. High-grade obstruction in the circumflex and ramus intermedius originating at the left main.  Widely patent LIMA  to the LAD  When compared to the prior angiographic images, no significant change has occurred.  Inferior wall hypokinesis, estimated ejection fraction 45%  Recommendations:  Continue medical management. No significant change in anatomy when compared to 01/28/2015.   Labs/Other Tests and Data Reviewed:    EKG:  An ECG dated July 12, 2018 was personally reviewed today and demonstrated:  Normal sinus rhythm rate of 72 possible lateral infarct, cannot rule out inferior infarct.  Recent Labs: No results found for requested labs within last 8760 hours.   Recent Lipid Panel Lab Results  Component Value Date/Time   CHOL 113 12/28/2013 05:30 AM   TRIG 204 (H) 12/28/2013 05:30 AM   HDL 25 (L) 12/28/2013 05:30 AM   CHOLHDL 4.5 12/28/2013 05:30 AM   LDLCALC 47 12/28/2013 05:30 AM    Wt Readings from Last 3 Encounters:  07/12/18 170 lb (77.1 kg)  06/07/18 186 lb (84.4 kg)  11/23/17 198 lb (89.8 kg)     Objective:    Vital Signs:  Ht 5\' 4"  (1.626 m)    BMI 29.18 kg/m    Speaking in complete sentences with normal speech pattern.  No evidence of cough or wheezing.  No shortness of breath noted.  ASSESSMENT & PLAN:    1. Coronary artery disease with history of bypass: History of bypass surgery in 2003.  Subsequent stenting in 2015.  Most recent PCI in 2017 without intervention.  Anatomy was essentially unchanged from previous catheterization. At last visit patient continued to take nitroglycerinsublingual as needed for anginal symptoms and shortness of breath.   Continue medication regimen of aspirin 81 mg p.o. daily, carvedilol 3.125 mg twice daily, Lasix 20 mg nightly, isosorbide mononitrate 60 mg daily, nitroglycerin sublingual as needed.   Patient mentioned possibly having another cardiac catheterization in the future due to continuing anginal symptoms and shortness of breath relieved with sublingual nitroglycerin.  2.  Ischemic cardiomyopathy.  Last echocardiogram showed  ejection fraction of 55 to 60%.  Mild LVH.  3.  Essential hypertension : Patient is unable to take his blood pressure at home.  He states at last primary care visit his blood pressure was within normal limits.  4. Hyperlipidemia: Patient takes Pravachol daily.  Patient states that at last PCP visit his triglycerides were elevated.  We will attempt to obtain those results and review.  All current medications were reviewed with patient.  Patient states he is compliant with medication regimen.  COVID-19 Education: The signs and symptoms of COVID-19 were discussed with the patient and how to seek care for testing (follow up with PCP or arrange E-visit). The importance of social distancing was discussed today.  Time:   Today, I have spent 15 minutes with the patient with telehealth technology discussing the above problems.     Medication Adjustments/Labs and Tests Ordered: Current medicines are reviewed at length with the patient today.  Concerns regarding medicines are outlined above.   Tests Ordered: No orders of the defined types were placed in this encounter.   Medication Changes: No orders of the defined types were placed in this encounter.   Follow Up:  Either In Person or Virtual in 9 month(s)  Signed, Netta Neat, NP  08/27/2019 11:02 AM    [facility]

## 2019-08-27 NOTE — Patient Instructions (Addendum)
Medication Instructions:   Your physician recommends that you continue on your current medications as directed. Please refer to the Current Medication list given to you today.  Labwork:  NONE  Testing/Procedures:  NONE  Follow-Up:  Your physician recommends that you schedule a follow-up appointment in: 3 months.  Any Other Special Instructions Will Be Listed Below (If Applicable).  If you need a refill on your cardiac medications before your next appointment, please call your pharmacy. 

## 2019-09-16 DIAGNOSIS — J449 Chronic obstructive pulmonary disease, unspecified: Secondary | ICD-10-CM | POA: Diagnosis not present

## 2019-09-16 DIAGNOSIS — K219 Gastro-esophageal reflux disease without esophagitis: Secondary | ICD-10-CM | POA: Diagnosis not present

## 2019-09-16 DIAGNOSIS — E119 Type 2 diabetes mellitus without complications: Secondary | ICD-10-CM | POA: Diagnosis not present

## 2019-10-27 ENCOUNTER — Other Ambulatory Visit: Payer: Self-pay | Admitting: Cardiology

## 2019-12-05 ENCOUNTER — Ambulatory Visit: Payer: Medicare Other | Admitting: Cardiology

## 2019-12-17 DIAGNOSIS — E119 Type 2 diabetes mellitus without complications: Secondary | ICD-10-CM | POA: Diagnosis not present

## 2019-12-17 DIAGNOSIS — K219 Gastro-esophageal reflux disease without esophagitis: Secondary | ICD-10-CM | POA: Diagnosis not present

## 2019-12-17 DIAGNOSIS — Z0000 Encounter for general adult medical examination without abnormal findings: Secondary | ICD-10-CM | POA: Diagnosis not present

## 2019-12-17 DIAGNOSIS — J449 Chronic obstructive pulmonary disease, unspecified: Secondary | ICD-10-CM | POA: Diagnosis not present

## 2019-12-26 ENCOUNTER — Other Ambulatory Visit: Payer: Self-pay | Admitting: Cardiology

## 2020-01-15 ENCOUNTER — Other Ambulatory Visit: Payer: Self-pay | Admitting: Cardiology

## 2020-02-18 NOTE — Progress Notes (Signed)
Cardiology Office Note  Date: 02/19/2020   ID: [name] [name], DOB Sep 07, 1966, MRN [id number]  PCP:  [name], MD  Cardiologist:  [name], MD Electrophysiologist:  None   Chief Complaint  Patient presents with  . Cardiac follow-up    History of Present Illness: [name] [name] is a 55 y.o. male last assessed via telehealth encounter in November 2020 by [name] NP.  He presents for a routine visit.  He states that over the last several months he has been experiencing more regular angina symptoms and progressive dyspnea on exertion.  He reports compliance with his medications, increasing use of nitroglycerin.  I reviewed his ECG today which shows normal sinus rhythm.  His current cardiac regimen includes aspirin, Plavix, Coreg, Imdur, lisinopril, Pravachol, and Ranexa.  Cardiac catheterization from 2017 showed combination of graft disease and progressive native vessel disease with collaterals, as well as patent stents in the ostial and distal SVG sites.  He was managed medically at that time.  Past Medical History:  Diagnosis Date  . Asthma   . Bipolar disorder ([place])   . Chronic back pain   . Coronary atherosclerosis of native coronary artery    a. CABG 2003. b. NSTEMI 2010 s/p BMS to SVG-RCA c. PTCA/stent/asp thromb to SVG-RCA 01/2010, PTCA to SVG-RCA 04/2010 d. known occlusion of SVG-RCA 2013. e. NSTEMI 07/2013  s/p PTCA/DES to SVG-diagonal/intermediate/OM1.   . Essential hypertension   . GERD (gastroesophageal reflux disease)   . Gout   . Ischemic cardiomyopathy    a. EF 35-40% by cath 07/2013.  . Mixed hyperlipidemia   . Sinus bradycardia   . Type 2 diabetes mellitus ([place])     Past Surgical History:  Procedure Laterality Date  . CARDIAC CATHETERIZATION  06/19/2014   Procedure: CORONARY STENT INTERVENTION;  Surgeon: Leonie Man, MD;  Location: [facility] CATH LAB;  Service: Cardiovascular;;  DES to seg SVG Diag/OM/[place]   . CARDIAC CATHETERIZATION N/A  11/22/2015   Procedure: Left Heart Cath and Coronary Angiography;  Surgeon: Belva Crome, MD;  Location: [facility] CV LAB;  Service: Cardiovascular;  Laterality: N/A;  . CHOLECYSTECTOMY    . CORONARY ARTERY BYPASS GRAFT     2003, LIMA to LAD; SVG to diagonal; SVG to OM, ramus, and diagonal; SVG to RCA  . LEFT HEART CATHETERIZATION WITH CORONARY ANGIOGRAM N/A 01/28/2015   Procedure: LEFT HEART CATHETERIZATION WITH CORONARY ANGIOGRAM;  Surgeon: Leonie Man, MD;  Location: [facility] CATH LAB;  Service: Cardiovascular;  Laterality: N/A;  . LEFT HEART CATHETERIZATION WITH CORONARY/GRAFT ANGIOGRAM N/A 07/14/2013   Procedure: LEFT HEART CATHETERIZATION WITH Beatrix Fetters;  Surgeon: Burnell Blanks, MD;  Location: [facility] CATH LAB;  Service: Cardiovascular;  Laterality: N/A;  . LEFT HEART CATHETERIZATION WITH CORONARY/GRAFT ANGIOGRAM N/A 12/29/2013   Procedure: LEFT HEART CATHETERIZATION WITH Beatrix Fetters;  Surgeon: Leonie Man, MD;  Location: [facility] CATH LAB;  Service: Cardiovascular;  Laterality: N/A;  . LEFT HEART CATHETERIZATION WITH CORONARY/GRAFT ANGIOGRAM N/A 06/19/2014   Procedure: LEFT HEART CATHETERIZATION WITH Beatrix Fetters;  Surgeon: Leonie Man, MD;  Location: [facility] CATH LAB;  Service: Cardiovascular;  Laterality: N/A;  . Metal plate to chin    . PERCUTANEOUS CORONARY STENT INTERVENTION (PCI-S)  07/14/2013   Procedure: PERCUTANEOUS CORONARY STENT INTERVENTION (PCI-S);  Surgeon: Burnell Blanks, MD;  Location: [facility] CATH LAB;  Service: Cardiovascular;;  . Pin placement left leg      Current Outpatient Medications  Medication Sig Dispense  Refill  . acetaminophen (TYLENOL) 325 MG tablet Take 2 tablets (650 mg total) by mouth every 4 (four) hours as needed for headache or mild pain.    Marland Kitchen albuterol (PROVENTIL HFA;VENTOLIN HFA) 108 (90 BASE) MCG/ACT inhaler Inhale 2 puffs into the lungs every 6 (six) hours as needed for wheezing or shortness of breath.    .  allopurinol (ZYLOPRIM) 300 MG tablet Take 300 mg by mouth daily.  0  . aspirin EC 81 MG tablet Take 1 tablet (81 mg total) by mouth daily.    . Blood Pressure Monitoring KIT 1 each by Does not apply route as directed. 1 kit 0  . carvedilol (COREG) 3.125 MG tablet TAKE ONE TABLET BY MOUTH 2 TIMES A DAY WITH A MEAL 180 tablet 2  . clopidogrel (PLAVIX) 75 MG tablet TAKE ONE TABLET BY MOUTH DAILY 90 tablet 1  . diazepam (VALIUM) 10 MG tablet Take 10 mg by mouth 2 (two) times daily.    Marland Kitchen FLOVENT HFA 220 MCG/ACT inhaler Inhale 1 puff into the lungs daily.     . furosemide (LASIX) 20 MG tablet TAKE ONE TABLET BY MOUTH EVERY NIGHT AT BEDTIME 90 tablet 2  . isosorbide mononitrate (IMDUR) 60 MG 24 hr tablet TAKE ONE TABLET BY MOUTH 2 TIMES A DAY 180 tablet 2  . lisinopril (ZESTRIL) 2.5 MG tablet TAKE ONE TABLET BY MOUTH DAILY 90 tablet 2  . Magnesium 200 MG TABS Take by mouth 2 (two) times daily.    . metFORMIN (GLUCOPHAGE) 1000 MG tablet Take 1 tablet (1,000 mg total) by mouth 2 (two) times daily with a meal.    . nitroGLYCERIN (NITROSTAT) 0.4 MG SL tablet PLACE 1 TABLET UNDER THE TONGUE AS NEEDED FOR CHEST PAIN. MAY REPEAT EVERY 5 MINUTES UP TO 3 DOSES. IF NO RELIEF AFTER 3 DOSES, CALL 911 OR 25 tablet 0  . omega-3 acid ethyl esters (LOVAZA) 1 g capsule Take 1 capsule by mouth every morning. & 2 capsules at night    . ONETOUCH VERIO test strip     . pantoprazole (PROTONIX) 40 MG tablet Take 40 mg by mouth daily.     . pravastatin (PRAVACHOL) 80 MG tablet TAKE ONE TABLET BY MOUTH EVERY EVENING 90 tablet 1  . ranolazine (RANEXA) 1000 MG SR tablet TAKE ONE TABLET BY MOUTH 2 TIMES A DAY 180 tablet 1   No current facility-administered medications for this visit.   Allergies:  Patient has no known allergies.   Family history: Family History  Problem Relation Age of Onset  . Stroke Other        family Hx   . Bowel Disease Mother   . Hernia Father   . Pneumonia Daughter    Social history: Social  History   Tobacco Use  . Smoking status: Current Every Day Smoker    Packs/day: 0.80    Years: 33.00    Pack years: 26.40    Types: Cigarettes    Start date: 07/01/1978  . Smokeless tobacco: Never Used  . Tobacco comment: trying to quit, 3/4 pack per day. Started the nicotine patch on 01-10-14-   Substance Use Topics  . Alcohol use: No    Alcohol/week: 0.0 standard drinks    Comment: drink a can of beer occasionally every year   . Drug use: Yes    Frequency: 2.0 times per week    Types: Marijuana    ROS:  No palpitations or syncope.  Physical Exam: VS:  BP 138/72  Pulse 69   Ht 5' 5" (1.651 m)   Wt 212 lb (96.2 kg)   SpO2 96%   BMI 35.28 kg/m , BMI Body mass index is 35.28 kg/m.  Wt Readings from Last 3 Encounters:  02/19/20 212 lb (96.2 kg)  07/12/18 170 lb (77.1 kg)  06/07/18 186 lb (84.4 kg)    General: Patient appears comfortable at rest, chronically ill-appearing. HEENT: Conjunctiva and lids normal, wearing a mask. Neck: Supple, no elevated JVP or carotid bruits, no thyromegaly. Lungs: Decreased breath sounds without wheezing, nonlabored breathing at rest. Cardiac: Regular rate and rhythm, no S3 or significant systolic murmur, no pericardial rub. Abdomen: Soft, bowel sounds present. Extremities: No pitting edema, distal pulses 2+. Skin: Warm and dry. Musculoskeletal: No kyphosis. Neuropsychiatric: Alert and oriented x3, affect grossly appropriate.  ECG:  An ECG dated 07/12/2018 was personally reviewed today and demonstrated:  Sinus rhythm with old inferolateral infarct pattern, nonspecific T wave changes.  Recent Labwork:  September 2020: Cholesterol 142, triglycerides 233, HDL 47, LDL 58, BUN 15, creatinine 1.11, potassium 4.9, hemoglobin A1c 5.3%  Other Studies Reviewed Today:  Cardiac catheterization 11/22/2015: 1. Ramus lesion, 95% stenosed. 2. Saphenous vein bypass graft disease with total occlusion of the SVG to the distal RCA. The sequential SVG to  the diagonal and ramus intermedius is disease with occlusion of the limb to the ramus intermedius. The side-to-side anastomosis on the diagonal remains patent. The stents in the ostium and distal saphenous vein graft sites are widely patent. 3. Patent LIMA to LAD 4. Total occlusion of the native right. The distal right coronary/PDA fills byleft-to-right collaterals from the LAD. Segmental diffuse disease in the left main. Total occlusion of the LAD. High-grade obstruction in the circumflex and ramus intermedius originating at the left main. 5. Widely patent LIMA to the LAD  When compared to the prior angiographic images, no significant change has occurred. Inferior wall hypokinesis, estimated ejection fraction 45%  Assessment and Plan:  1.  Multivessel CAD status post CABG with graft disease and subsequent PCI.  Cardiac catheterization in 2017 is outlined above with plan for medical therapy at that point.  He is reporting progressive angina symptoms and dyspnea on exertion on well-rounded medical therapy including aspirin, Plavix, Imdur, lisinopril, Coreg, Ranexa, and Pravachol.  We have discussed plan for follow-up cardiac catheterization to see if he has any viable revascularization options that might help his symptoms.  After reviewing the risks and benefits, he is in agreement to proceed.  2.  Longstanding tobacco abuse.  We have discussed smoking cessation over time, he also tried Chantix.  He has not been able to quit long-term.  3.  Ischemic cardiomyopathy with LVEF approximately 45% by last assessment.  He is on Coreg, lisinopril, and Lasix.  4.  Mixed hyperlipidemia on Pravachol.  Last LDL was 58.  Medication Adjustments/Labs and Tests Ordered: Current medicines are reviewed at length with the patient today.  Concerns regarding medicines are outlined above.   Tests Ordered: Orders Placed This Encounter  Procedures  . EKG 12-Lead    Medication Changes: No orders of the  defined types were placed in this encounter.   Disposition:  Follow up after procedure.  Signed, Satira Sark, MD, [facility] 02/19/2020 10:43 AM    [facility] [place], [place], [place] [address] Phone: [phone]; Fax: [phone]

## 2020-02-19 ENCOUNTER — Ambulatory Visit (INDEPENDENT_AMBULATORY_CARE_PROVIDER_SITE_OTHER): Payer: Medicare Other | Admitting: Cardiology

## 2020-02-19 ENCOUNTER — Other Ambulatory Visit: Payer: Self-pay

## 2020-02-19 ENCOUNTER — Encounter: Payer: Self-pay | Admitting: Cardiology

## 2020-02-19 VITALS — BP 138/72 | HR 69 | Ht 65.0 in | Wt 212.0 lb

## 2020-02-19 DIAGNOSIS — I255 Ischemic cardiomyopathy: Secondary | ICD-10-CM

## 2020-02-19 DIAGNOSIS — E782 Mixed hyperlipidemia: Secondary | ICD-10-CM | POA: Diagnosis not present

## 2020-02-19 DIAGNOSIS — I200 Unstable angina: Secondary | ICD-10-CM | POA: Diagnosis not present

## 2020-02-19 DIAGNOSIS — I25119 Atherosclerotic heart disease of native coronary artery with unspecified angina pectoris: Secondary | ICD-10-CM

## 2020-02-19 DIAGNOSIS — Z720 Tobacco use: Secondary | ICD-10-CM

## 2020-02-19 NOTE — Patient Instructions (Signed)
Your physician recommends that you schedule a follow-up appointment in: PENDING WITH DR MCDOWELL  Your physician recommends that you continue on your current medications as directed. Please refer to the Current Medication list given to you today.  PLEASE CALL us BACK TO SCHEDULE HEART CATHETERIZATION   Thank you for choosing [facility]!!

## 2020-03-09 ENCOUNTER — Ambulatory Visit: Payer: Medicare Other | Admitting: Cardiology

## 2020-03-17 DIAGNOSIS — Z0000 Encounter for general adult medical examination without abnormal findings: Secondary | ICD-10-CM | POA: Diagnosis not present

## 2020-03-17 DIAGNOSIS — J449 Chronic obstructive pulmonary disease, unspecified: Secondary | ICD-10-CM | POA: Diagnosis not present

## 2020-03-17 DIAGNOSIS — E119 Type 2 diabetes mellitus without complications: Secondary | ICD-10-CM | POA: Diagnosis not present

## 2020-03-17 DIAGNOSIS — K219 Gastro-esophageal reflux disease without esophagitis: Secondary | ICD-10-CM | POA: Diagnosis not present

## 2020-04-22 ENCOUNTER — Other Ambulatory Visit: Payer: Self-pay | Admitting: Cardiology

## 2020-06-03 ENCOUNTER — Ambulatory Visit: Payer: Medicare Other | Attending: Internal Medicine

## 2020-06-03 DIAGNOSIS — Z23 Encounter for immunization: Secondary | ICD-10-CM

## 2020-06-03 NOTE — Progress Notes (Signed)
   Covid-19 Vaccination Clinic  Name:  YASSIN SCALES    MRN: [id number] DOB: [birth year]  06/03/2020  Mr. Franckowiak was observed post Covid-19 immunization for 15 minutes without incident. He was provided with Vaccine Information Sheet and instruction to access the V-Safe system.   Mr. Garraway was instructed to call 911 with any severe reactions post vaccine: Marland Kitchen Difficulty breathing  . Swelling of face and throat  . A fast heartbeat  . A bad rash all over body  . Dizziness and weakness   Immunizations Administered    Name Date Dose VIS Date Route   Pfizer COVID-19 Vaccine 06/03/2020 10:45 AM 0.3 mL 12/03/2018 Intramuscular   Manufacturer: [employer], [employer]   Lot: [id number]   NDC: [address]-2

## 2020-06-16 DIAGNOSIS — Z0000 Encounter for general adult medical examination without abnormal findings: Secondary | ICD-10-CM | POA: Diagnosis not present

## 2020-06-16 DIAGNOSIS — K219 Gastro-esophageal reflux disease without esophagitis: Secondary | ICD-10-CM | POA: Diagnosis not present

## 2020-06-16 DIAGNOSIS — J449 Chronic obstructive pulmonary disease, unspecified: Secondary | ICD-10-CM | POA: Diagnosis not present

## 2020-06-16 DIAGNOSIS — E1169 Type 2 diabetes mellitus with other specified complication: Secondary | ICD-10-CM | POA: Diagnosis not present

## 2020-06-24 ENCOUNTER — Ambulatory Visit: Payer: Medicare Other

## 2020-07-08 ENCOUNTER — Ambulatory Visit: Payer: Medicare Other | Attending: Internal Medicine

## 2020-07-08 DIAGNOSIS — Z23 Encounter for immunization: Secondary | ICD-10-CM

## 2020-07-08 NOTE — Progress Notes (Signed)
   Covid-19 Vaccination Clinic  Name:  [name] [name]    MRN: [id number] DOB: January 27, 1966  07/08/2020  [name] was observed post Covid-19 immunization for 15 minutes without incident. He was provided with Vaccine Information Sheet and instruction to access the V-Safe system.   [name] was instructed to call 911 with any severe reactions post vaccine: Marland Kitchen Difficulty breathing  . Swelling of face and throat  . A fast heartbeat  . A bad rash all over body  . Dizziness and weakness   Immunizations Administered    Name Date Dose VIS Date Route   Pfizer COVID-19 Vaccine 07/08/2020 11:50 AM 0.3 mL 12/03/2018 Intramuscular   Manufacturer: [employer], [employer]   Lot: [id number]   NDC: [id number]

## 2020-07-13 DIAGNOSIS — H43393 Other vitreous opacities, bilateral: Secondary | ICD-10-CM | POA: Diagnosis not present

## 2020-08-03 DIAGNOSIS — H6123 Impacted cerumen, bilateral: Secondary | ICD-10-CM | POA: Diagnosis not present

## 2020-08-03 DIAGNOSIS — R42 Dizziness and giddiness: Secondary | ICD-10-CM | POA: Diagnosis not present

## 2020-08-03 DIAGNOSIS — H9313 Tinnitus, bilateral: Secondary | ICD-10-CM | POA: Diagnosis not present

## 2020-08-03 DIAGNOSIS — H903 Sensorineural hearing loss, bilateral: Secondary | ICD-10-CM | POA: Diagnosis not present

## 2020-08-19 ENCOUNTER — Emergency Department (HOSPITAL_COMMUNITY): Payer: Medicare Other

## 2020-08-19 ENCOUNTER — Observation Stay (HOSPITAL_BASED_OUTPATIENT_CLINIC_OR_DEPARTMENT_OTHER): Payer: Medicare Other

## 2020-08-19 ENCOUNTER — Observation Stay (HOSPITAL_COMMUNITY)
Admission: EM | Admit: 2020-08-19 | Discharge: 2020-08-20 | Disposition: A | Discharge status: Home / Self Care | Payer: Medicare Other | Attending: Cardiology | Admitting: Cardiology

## 2020-08-19 ENCOUNTER — Encounter (HOSPITAL_COMMUNITY): Payer: Self-pay | Admitting: Emergency Medicine

## 2020-08-19 ENCOUNTER — Other Ambulatory Visit: Payer: Self-pay

## 2020-08-19 ENCOUNTER — Ambulatory Visit (HOSPITAL_COMMUNITY)
Admission: EM | Disposition: A | Discharge status: Home / Self Care | Payer: Self-pay | Source: Home / Self Care | Attending: Emergency Medicine

## 2020-08-19 DIAGNOSIS — I25110 Atherosclerotic heart disease of native coronary artery with unstable angina pectoris: Secondary | ICD-10-CM | POA: Diagnosis not present

## 2020-08-19 DIAGNOSIS — I200 Unstable angina: Principal | ICD-10-CM | POA: Diagnosis present

## 2020-08-19 DIAGNOSIS — I2510 Atherosclerotic heart disease of native coronary artery without angina pectoris: Secondary | ICD-10-CM | POA: Diagnosis not present

## 2020-08-19 DIAGNOSIS — E118 Type 2 diabetes mellitus with unspecified complications: Secondary | ICD-10-CM

## 2020-08-19 DIAGNOSIS — R079 Chest pain, unspecified: Secondary | ICD-10-CM | POA: Diagnosis not present

## 2020-08-19 DIAGNOSIS — E785 Hyperlipidemia, unspecified: Secondary | ICD-10-CM

## 2020-08-19 DIAGNOSIS — I255 Ischemic cardiomyopathy: Secondary | ICD-10-CM | POA: Diagnosis not present

## 2020-08-19 DIAGNOSIS — E782 Mixed hyperlipidemia: Secondary | ICD-10-CM | POA: Diagnosis present

## 2020-08-19 DIAGNOSIS — Z720 Tobacco use: Secondary | ICD-10-CM

## 2020-08-19 DIAGNOSIS — I25710 Atherosclerosis of autologous vein coronary artery bypass graft(s) with unstable angina pectoris: Secondary | ICD-10-CM

## 2020-08-19 DIAGNOSIS — R072 Precordial pain: Secondary | ICD-10-CM | POA: Diagnosis present

## 2020-08-19 DIAGNOSIS — I25700 Atherosclerosis of coronary artery bypass graft(s), unspecified, with unstable angina pectoris: Secondary | ICD-10-CM | POA: Diagnosis present

## 2020-08-19 DIAGNOSIS — E1169 Type 2 diabetes mellitus with other specified complication: Secondary | ICD-10-CM

## 2020-08-19 DIAGNOSIS — I10 Essential (primary) hypertension: Secondary | ICD-10-CM | POA: Diagnosis present

## 2020-08-19 DIAGNOSIS — E119 Type 2 diabetes mellitus without complications: Secondary | ICD-10-CM

## 2020-08-19 DIAGNOSIS — I2089 Other forms of angina pectoris: Secondary | ICD-10-CM | POA: Diagnosis present

## 2020-08-19 DIAGNOSIS — Z20822 Contact with and (suspected) exposure to covid-19: Secondary | ICD-10-CM | POA: Diagnosis not present

## 2020-08-19 DIAGNOSIS — F17210 Nicotine dependence, cigarettes, uncomplicated: Secondary | ICD-10-CM | POA: Insufficient documentation

## 2020-08-19 DIAGNOSIS — Z79899 Other long term (current) drug therapy: Secondary | ICD-10-CM | POA: Diagnosis not present

## 2020-08-19 DIAGNOSIS — I208 Other forms of angina pectoris: Secondary | ICD-10-CM | POA: Diagnosis present

## 2020-08-19 DIAGNOSIS — F17200 Nicotine dependence, unspecified, uncomplicated: Secondary | ICD-10-CM | POA: Diagnosis present

## 2020-08-19 HISTORY — PX: CARDIAC CATHETERIZATION: SHX172

## 2020-08-19 HISTORY — PX: LEFT HEART CATH AND CORS/GRAFTS ANGIOGRAPHY: CATH118250

## 2020-08-19 LAB — BASIC METABOLIC PANEL
Anion gap: 10 (ref 5–15)
BUN: 15 mg/dL (ref 6–20)
CO2: 29 mmol/L (ref 22–32)
Calcium: 9.1 mg/dL (ref 8.9–10.3)
Chloride: 97 mmol/L — ABNORMAL LOW (ref 98–111)
Creatinine, Ser: 1.14 mg/dL (ref 0.61–1.24)
GFR, Estimated: >60 mL/min (ref >60)
Glucose, Bld: 111 mg/dL — ABNORMAL HIGH (ref 70–99)
Potassium: 3.2 mmol/L — ABNORMAL LOW (ref 3.5–5.1)
Sodium: 136 mmol/L (ref 135–145)

## 2020-08-19 LAB — CBC
HCT: 36.3 % — ABNORMAL LOW (ref 39.0–52.0)
Hemoglobin: 12.4 g/dL — ABNORMAL LOW (ref 13.0–17.0)
MCH: 37 pg — ABNORMAL HIGH (ref 26.0–34.0)
MCHC: 34.2 g/dL (ref 30.0–36.0)
MCV: 108.4 fL — ABNORMAL HIGH (ref 80.0–100.0)
Platelets: 194 10*3/uL (ref 150–400)
RBC: 3.35 MIL/uL — ABNORMAL LOW (ref 4.22–5.81)
RDW: 14.2 % (ref 11.5–15.5)
WBC: 7.5 10*3/uL (ref 4.0–10.5)
nRBC: 0 % (ref 0.0–0.2)

## 2020-08-19 LAB — HEPARIN LEVEL (UNFRACTIONATED): Heparin Unfractionated: 0.18 IU/mL — ABNORMAL LOW (ref 0.30–0.70)

## 2020-08-19 LAB — HEMOGLOBIN A1C
Hgb A1c MFr Bld: 5.3 % (ref 4.8–5.6)
Mean Plasma Glucose: 105.41 mg/dL

## 2020-08-19 LAB — GLUCOSE, CAPILLARY
Glucose-Capillary: 69 mg/dL — ABNORMAL LOW (ref 70–99)
Glucose-Capillary: 146 mg/dL — ABNORMAL HIGH (ref 70–99)

## 2020-08-19 LAB — ECHOCARDIOGRAM COMPLETE
Area-P 1/2: 3.89 cm2
Height: 64 in
S' Lateral: 3.32 cm
Weight: 3360 oz

## 2020-08-19 LAB — RESPIRATORY PANEL BY RT PCR (FLU A&B, COVID)
Influenza A by PCR: NEGATIVE
Influenza B by PCR: NEGATIVE
SARS Coronavirus 2 by RT PCR: NEGATIVE

## 2020-08-19 LAB — CBG MONITORING, ED
Glucose-Capillary: 122 mg/dL — ABNORMAL HIGH (ref 70–99)
Glucose-Capillary: 85 mg/dL (ref 70–99)

## 2020-08-19 LAB — HIV ANTIBODY (ROUTINE TESTING W REFLEX): HIV Screen 4th Generation wRfx: NONREACTIVE

## 2020-08-19 LAB — TROPONIN I (HIGH SENSITIVITY)
Troponin I (High Sensitivity): 9 ng/L (ref <18)
Troponin I (High Sensitivity): 8 ng/L (ref <18)

## 2020-08-19 SURGERY — LEFT HEART CATH AND CORS/GRAFTS ANGIOGRAPHY
Anesthesia: LOCAL

## 2020-08-19 MED ORDER — HEPARIN BOLUS VIA INFUSION
2500.0000 [IU] | Freq: Once | INTRAVENOUS | Status: AC
  Administered 2020-08-19: 2500 [IU] via INTRAVENOUS

## 2020-08-19 MED ORDER — PRAVASTATIN SODIUM 40 MG PO TABS
80.0000 mg | ORAL_TABLET | Freq: Every evening | ORAL | Status: DC
  Administered 2020-08-19: 80 mg via ORAL
  Filled 2020-08-19: qty 2
  Filled 2020-08-19: qty 1

## 2020-08-19 MED ORDER — ASPIRIN EC 81 MG PO TBEC
81.0000 mg | DELAYED_RELEASE_TABLET | Freq: Every day | ORAL | Status: DC
  Administered 2020-08-20: 81 mg via ORAL
  Filled 2020-08-19: qty 1

## 2020-08-19 MED ORDER — NITROGLYCERIN 0.4 MG SL SUBL
0.4000 mg | SUBLINGUAL_TABLET | SUBLINGUAL | Status: DC | PRN
  Administered 2020-08-19 (×2): 0.4 mg via SUBLINGUAL
  Filled 2020-08-19: qty 1

## 2020-08-19 MED ORDER — INSULIN ASPART 100 UNIT/ML ~~LOC~~ SOLN: 0.0000 [IU] | Freq: Every day | SUBCUTANEOUS | Status: DC

## 2020-08-19 MED ORDER — HEPARIN (PORCINE) IN NACL 1000-0.9 UT/500ML-% IV SOLN
INTRAVENOUS | Status: AC
  Filled 2020-08-19: qty 500

## 2020-08-19 MED ORDER — CLOPIDOGREL BISULFATE 75 MG PO TABS
75.0000 mg | ORAL_TABLET | Freq: Every day | ORAL | Status: DC
  Administered 2020-08-19 – 2020-08-20 (×2): 75 mg via ORAL
  Filled 2020-08-19 (×2): qty 1

## 2020-08-19 MED ORDER — NITROGLYCERIN 1 MG/10 ML FOR IR/CATH LAB
INTRA_ARTERIAL | Status: AC
  Filled 2020-08-19: qty 10

## 2020-08-19 MED ORDER — LIDOCAINE HCL (PF) 1 % IJ SOLN
INTRAMUSCULAR | Status: DC | PRN
  Administered 2020-08-19: 2 mL
  Administered 2020-08-19: 15 mL

## 2020-08-19 MED ORDER — FUROSEMIDE 40 MG PO TABS: 20.0000 mg | ORAL_TABLET | Freq: Every day | ORAL | Status: DC

## 2020-08-19 MED ORDER — LIDOCAINE HCL (PF) 1 % IJ SOLN
INTRAMUSCULAR | Status: AC
  Filled 2020-08-19: qty 30

## 2020-08-19 MED ORDER — HEPARIN SODIUM (PORCINE) 1000 UNIT/ML IJ SOLN
INTRAMUSCULAR | Status: AC
  Filled 2020-08-19: qty 1

## 2020-08-19 MED ORDER — MIDAZOLAM HCL 2 MG/2ML IJ SOLN
INTRAMUSCULAR | Status: AC
  Filled 2020-08-19: qty 2

## 2020-08-19 MED ORDER — ENOXAPARIN SODIUM 30 MG/0.3ML ~~LOC~~ SOLN
30.0000 mg | SUBCUTANEOUS | Status: DC
  Administered 2020-08-20: 30 mg via SUBCUTANEOUS
  Filled 2020-08-19: qty 0.3

## 2020-08-19 MED ORDER — IOHEXOL 350 MG/ML SOLN
INTRAVENOUS | Status: DC | PRN
  Administered 2020-08-19: 85 mL

## 2020-08-19 MED ORDER — ISOSORBIDE MONONITRATE ER 60 MG PO TB24
60.0000 mg | ORAL_TABLET | Freq: Every day | ORAL | Status: DC
  Administered 2020-08-19 – 2020-08-20 (×2): 60 mg via ORAL
  Filled 2020-08-19 (×3): qty 1

## 2020-08-19 MED ORDER — HEPARIN (PORCINE) 25000 UT/250ML-% IV SOLN
1350.0000 [IU]/h | INTRAVENOUS | Status: DC
  Administered 2020-08-19: 1100 [IU]/h via INTRAVENOUS
  Filled 2020-08-19: qty 250

## 2020-08-19 MED ORDER — ALBUTEROL SULFATE HFA 108 (90 BASE) MCG/ACT IN AERS
2.0000 | INHALATION_SPRAY | Freq: Four times a day (QID) | RESPIRATORY_TRACT | Status: DC | PRN
  Filled 2020-08-19: qty 6.7

## 2020-08-19 MED ORDER — HEPARIN BOLUS VIA INFUSION
4000.0000 [IU] | Freq: Once | INTRAVENOUS | Status: AC
  Administered 2020-08-19: 4000 [IU] via INTRAVENOUS

## 2020-08-19 MED ORDER — SODIUM CHLORIDE 0.9 % IV SOLN: INTRAVENOUS | Status: DC

## 2020-08-19 MED ORDER — HEPARIN (PORCINE) IN NACL 1000-0.9 UT/500ML-% IV SOLN
INTRAVENOUS | Status: DC | PRN
  Administered 2020-08-19 (×2): 500 mL

## 2020-08-19 MED ORDER — CARVEDILOL 3.125 MG PO TABS
3.1250 mg | ORAL_TABLET | Freq: Two times a day (BID) | ORAL | Status: DC
  Administered 2020-08-19 – 2020-08-20 (×2): 3.125 mg via ORAL
  Filled 2020-08-19 (×2): qty 1

## 2020-08-19 MED ORDER — FENTANYL CITRATE (PF) 100 MCG/2ML IJ SOLN
INTRAMUSCULAR | Status: DC | PRN
  Administered 2020-08-19 (×3): 25 ug via INTRAVENOUS

## 2020-08-19 MED ORDER — ACETAMINOPHEN 325 MG PO TABS
650.0000 mg | ORAL_TABLET | Freq: Four times a day (QID) | ORAL | Status: DC | PRN
  Administered 2020-08-19: 650 mg via ORAL
  Filled 2020-08-19 (×2): qty 2

## 2020-08-19 MED ORDER — SODIUM CHLORIDE 0.9% FLUSH
3.0000 mL | Freq: Two times a day (BID) | INTRAVENOUS | Status: DC
  Administered 2020-08-19 (×2): 3 mL via INTRAVENOUS

## 2020-08-19 MED ORDER — HYDRALAZINE HCL 20 MG/ML IJ SOLN: 10.0000 mg | INTRAMUSCULAR | Status: AC | PRN

## 2020-08-19 MED ORDER — LISINOPRIL 5 MG PO TABS: 2.5000 mg | ORAL_TABLET | Freq: Every day | ORAL | Status: DC

## 2020-08-19 MED ORDER — VERAPAMIL HCL 2.5 MG/ML IV SOLN: INTRAVENOUS | Status: DC | PRN

## 2020-08-19 MED ORDER — SODIUM CHLORIDE 0.9 % IV SOLN: 250.0000 mL | INTRAVENOUS | Status: DC | PRN

## 2020-08-19 MED ORDER — POTASSIUM CHLORIDE 10 MEQ/100ML IV SOLN
10.0000 meq | INTRAVENOUS | Status: AC
  Administered 2020-08-19 (×4): 10 meq via INTRAVENOUS
  Filled 2020-08-19 (×4): qty 100

## 2020-08-19 MED ORDER — VERAPAMIL HCL 2.5 MG/ML IV SOLN
INTRAVENOUS | Status: AC
  Filled 2020-08-19: qty 2

## 2020-08-19 MED ORDER — PANTOPRAZOLE SODIUM 40 MG PO TBEC
40.0000 mg | DELAYED_RELEASE_TABLET | Freq: Every day | ORAL | Status: DC
  Administered 2020-08-19 – 2020-08-20 (×2): 40 mg via ORAL
  Filled 2020-08-19 (×2): qty 1

## 2020-08-19 MED ORDER — MIDAZOLAM HCL 2 MG/2ML IJ SOLN
INTRAMUSCULAR | Status: DC | PRN
  Administered 2020-08-19 (×2): 1 mg via INTRAVENOUS

## 2020-08-19 MED ORDER — ONDANSETRON HCL 4 MG/2ML IJ SOLN: 4.0000 mg | Freq: Four times a day (QID) | INTRAMUSCULAR | Status: DC | PRN

## 2020-08-19 MED ORDER — INSULIN ASPART 100 UNIT/ML ~~LOC~~ SOLN: 0.0000 [IU] | Freq: Three times a day (TID) | SUBCUTANEOUS | Status: DC

## 2020-08-19 MED ORDER — ASPIRIN 81 MG PO CHEW
81.0000 mg | CHEWABLE_TABLET | ORAL | Status: AC
  Administered 2020-08-19: 81 mg via ORAL
  Filled 2020-08-19: qty 1

## 2020-08-19 MED ORDER — ASPIRIN 81 MG PO CHEW
324.0000 mg | CHEWABLE_TABLET | Freq: Once | ORAL | Status: AC
  Administered 2020-08-19: 324 mg via ORAL
  Filled 2020-08-19: qty 4

## 2020-08-19 MED ORDER — DIAZEPAM 5 MG PO TABS: 10.0000 mg | ORAL_TABLET | Freq: Two times a day (BID) | ORAL | Status: DC | PRN

## 2020-08-19 MED ORDER — LABETALOL HCL 5 MG/ML IV SOLN: 10.0000 mg | INTRAVENOUS | Status: AC | PRN

## 2020-08-19 MED ORDER — SODIUM CHLORIDE 0.9% FLUSH
3.0000 mL | INTRAVENOUS | Status: DC | PRN
  Administered 2020-08-19: 3 mL via INTRAVENOUS

## 2020-08-19 MED ORDER — FLUTICASONE PROPIONATE HFA 220 MCG/ACT IN AERO: 1.0000 | INHALATION_SPRAY | Freq: Every day | RESPIRATORY_TRACT | Status: DC

## 2020-08-19 MED ORDER — RANOLAZINE ER 500 MG PO TB12
1000.0000 mg | ORAL_TABLET | Freq: Two times a day (BID) | ORAL | Status: DC
  Administered 2020-08-19 – 2020-08-20 (×3): 1000 mg via ORAL
  Filled 2020-08-19 (×5): qty 2

## 2020-08-19 MED ORDER — FENTANYL CITRATE (PF) 100 MCG/2ML IJ SOLN
INTRAMUSCULAR | Status: AC
  Filled 2020-08-19: qty 2

## 2020-08-19 MED ORDER — SODIUM CHLORIDE 0.9% FLUSH
3.0000 mL | Freq: Two times a day (BID) | INTRAVENOUS | Status: DC
  Administered 2020-08-19 – 2020-08-20 (×2): 3 mL via INTRAVENOUS

## 2020-08-19 MED ORDER — FUROSEMIDE 40 MG PO TABS
40.0000 mg | ORAL_TABLET | Freq: Every day | ORAL | Status: DC
  Administered 2020-08-19 – 2020-08-20 (×2): 40 mg via ORAL
  Filled 2020-08-19 (×2): qty 1

## 2020-08-19 MED ORDER — BUDESONIDE 0.5 MG/2ML IN SUSP
0.5000 mg | Freq: Two times a day (BID) | RESPIRATORY_TRACT | Status: DC
  Administered 2020-08-19 – 2020-08-20 (×3): 0.5 mg via RESPIRATORY_TRACT
  Filled 2020-08-19 (×3): qty 2

## 2020-08-19 SURGICAL SUPPLY — 11 items
CATH INFINITI 5 FR IM (CATHETERS) ×1 IMPLANT
CATH INFINITI 5FR MULTPACK ANG (CATHETERS) ×1 IMPLANT
GLIDESHEATH SLEND SS 6F .021 (SHEATH) ×1 IMPLANT
KIT HEART LEFT (KITS) ×2 IMPLANT
KIT MICROPUNCTURE NIT STIFF (SHEATH) ×1 IMPLANT
PACK CARDIAC CATHETERIZATION (CUSTOM PROCEDURE TRAY) ×2 IMPLANT
SHEATH PINNACLE 5F 10CM (SHEATH) ×1 IMPLANT
SHEATH PROBE COVER 6X72 (BAG) ×1 IMPLANT
SYR MEDRAD MARK 7 150ML (SYRINGE) ×2 IMPLANT
TRANSDUCER W/STOPCOCK (MISCELLANEOUS) ×2 IMPLANT
TUBING CIL FLEX 10 FLL-RA (TUBING) ×2 IMPLANT

## 2020-08-19 NOTE — H&P (View-Only) (Signed)
Cardiology Consult    Patient ID: [id number]; [id number]; May 30, 1966   Admit date: 08/19/2020 Date of Consult: 08/19/2020  Primary Care Provider: [name], MD Primary Cardiologist: [name] Lesches, MD   Patient Profile    [name] [name] is a 54 y.o. male with past medical history of CAD (s/p CABG in 2003, BMS to [place] in 2010, PTCA and stenting to [place] in 01/2010 and PTCA alone to same vessel in 04/2010, CTO of SVG-RCA in 2013, DES to SVG-D1-RI-OM1 in 2014, cath in 11/2015 showing patent LIMA-LAD and SVG-D1-RI with occlusion of limb to RI with stents in the ostium and distal SVG remaining patent and medical management recommended), ischemic cardiomyopathy (EF 45% by cath in 11/2015), HTN, HLD, Type 2 DM and tobacco use who is being seen today for the evaluation of chest pain at the request of Dr. Clearence [name].   History of Present Illness    [name] [name] was last examined by Dr. Domenic Polite in 02/2020 and he reported more frequent episodes of angina and was having to utilize sublingual nitroglycerin more frequently. He reported compliance with his medical therapy and by review of notes, a repeat cardiac catheterization was recommended but it does not appear this was pursued.  He presented to [facility] ED during the early morning hours of 08/19/2020 for evaluation of progressive chest pain and back pain. In talking with the patient today, he reports episodes of progressive chest pain since his office visit in 02/2020 but kept putting off the procedure. He eventually came to the ED for evaluation after several family members encouraged him to do so. Says he was initially only having chest pressure with walking for long distances but over the past few weeks he has developed chest pain with walking from room to room in his home. Reports associated dyspnea. Also reports some episodes of pain at night which awake him from sleep. Pain typically resolves with 1-2 SL NTG. No recent  orthopnea or PND. Has experienced intermittent lower extremity edema. He reports good compliance with his current medication regimen and denies missing any recent doses. Continues to smoke approximately 1 ppd.   Initial labs show WBC 7.5, Hgb 12.4, platelets 194, Na+ 136, K+ 3.2 and creatinine 1.14. Initial HS Troponin 9 with repeat of 8. COVID negative. FLP and Hgb A1c pending. CXR with no active cardiopulmonary disease. EKG shows NSR, HR 67 with inferior infarct pattern.   He has been continued on PTA ASA, Plavix, Coreg, Imdur, Ranexa and Pravastatin along with Heparin being initiated.   Past Medical History:  Diagnosis Date   Asthma    Bipolar disorder ([place])    Chronic back pain    Coronary atherosclerosis of native coronary artery    a. CABG 2003. b. NSTEMI 2010 s/p BMS to SVG-RCA c. PTCA/stent/asp thromb to SVG-RCA 01/2010, PTCA to SVG-RCA 04/2010 d. known occlusion of SVG-RCA 2013. e. NSTEMI 07/2013  s/p PTCA/DES to SVG-diagonal/intermediate/OM1.    Essential hypertension    GERD (gastroesophageal reflux disease)    Gout    Ischemic cardiomyopathy    a. EF 35-40% by cath 07/2013.   Mixed hyperlipidemia    Sinus bradycardia    Type 2 diabetes mellitus [facility])     Past Surgical History:  Procedure Laterality Date   CARDIAC CATHETERIZATION  06/19/2014   Procedure: CORONARY STENT INTERVENTION;  Surgeon: Leonie Man, MD;  Location: [facility] CATH LAB;  Service: Cardiovascular;;  DES to seg SVG Diag/OM/Ramus    CARDIAC CATHETERIZATION  N/A 11/22/2015   Procedure: Left Heart Cath and Coronary Angiography;  Surgeon: Belva Crome, MD;  Location: [facility] CV LAB;  Service: Cardiovascular;  Laterality: N/A;   CHOLECYSTECTOMY     CORONARY ARTERY BYPASS GRAFT     2003, LIMA to LAD; SVG to diagonal; SVG to OM, ramus, and diagonal; SVG to RCA   LEFT HEART CATHETERIZATION WITH CORONARY ANGIOGRAM N/A 01/28/2015   Procedure: LEFT HEART CATHETERIZATION WITH CORONARY ANGIOGRAM;  Surgeon:  Leonie Man, MD;  Location: [facility] CATH LAB;  Service: Cardiovascular;  Laterality: N/A;   LEFT HEART CATHETERIZATION WITH CORONARY/GRAFT ANGIOGRAM N/A 07/14/2013   Procedure: LEFT HEART CATHETERIZATION WITH Beatrix Fetters;  Surgeon: Burnell Blanks, MD;  Location: [facility] CATH LAB;  Service: Cardiovascular;  Laterality: N/A;   LEFT HEART CATHETERIZATION WITH CORONARY/GRAFT ANGIOGRAM N/A 12/29/2013   Procedure: LEFT HEART CATHETERIZATION WITH Beatrix Fetters;  Surgeon: Leonie Man, MD;  Location: [facility] CATH LAB;  Service: Cardiovascular;  Laterality: N/A;   LEFT HEART CATHETERIZATION WITH CORONARY/GRAFT ANGIOGRAM N/A 06/19/2014   Procedure: LEFT HEART CATHETERIZATION WITH Beatrix Fetters;  Surgeon: Leonie Man, MD;  Location: [facility] CATH LAB;  Service: Cardiovascular;  Laterality: N/A;   Metal plate to chin     PERCUTANEOUS CORONARY STENT INTERVENTION (PCI-S)  07/14/2013   Procedure: PERCUTANEOUS CORONARY STENT INTERVENTION (PCI-S);  Surgeon: Burnell Blanks, MD;  Location: [facility] CATH LAB;  Service: Cardiovascular;;   Pin placement left leg       Home Medications:  Prior to Admission medications   Medication Sig Start Date End Date Taking? Authorizing Provider  acetaminophen (TYLENOL) 325 MG tablet Take 2 tablets (650 mg total) by mouth every 4 (four) hours as needed for headache or mild pain. 06/20/14   Erlene Quan, PA-C  albuterol (PROVENTIL HFA;VENTOLIN HFA) 108 (90 BASE) MCG/ACT inhaler Inhale 2 puffs into the lungs every 6 (six) hours as needed for wheezing or shortness of breath.    [provider]  allopurinol (ZYLOPRIM) 300 MG tablet Take 300 mg by mouth daily. 11/03/15   [provider]  aspirin EC 81 MG tablet Take 1 tablet (81 mg total) by mouth daily. 12/05/12   Donney Dice, PA-C  Blood Pressure Monitoring KIT 1 each by Does not apply route as directed. 09/29/14   Satira Sark, MD  carvedilol (COREG) 3.125 MG tablet  TAKE ONE TABLET BY MOUTH 2 TIMES A DAY WITH A MEAL 12/26/19   Satira Sark, MD  clopidogrel (PLAVIX) 75 MG tablet TAKE ONE TABLET BY MOUTH DAILY 04/22/20   Satira Sark, MD  diazepam (VALIUM) 10 MG tablet Take 10 mg by mouth 2 (two) times daily.    [provider]  FLOVENT HFA 220 MCG/ACT inhaler Inhale 1 puff into the lungs daily.  10/10/17   [provider]  furosemide (LASIX) 20 MG tablet TAKE ONE TABLET BY MOUTH EVERY NIGHT AT BEDTIME 12/26/19   Satira Sark, MD  isosorbide mononitrate (IMDUR) 60 MG 24 hr tablet TAKE ONE TABLET BY MOUTH 2 TIMES A DAY 01/15/20   Satira Sark, MD  lisinopril (ZESTRIL) 2.5 MG tablet TAKE ONE TABLET BY MOUTH DAILY 12/26/19   Satira Sark, MD  Magnesium 200 MG TABS Take by mouth 2 (two) times daily.    [provider]  metFORMIN (GLUCOPHAGE) 1000 MG tablet Take 1 tablet (1,000 mg total) by mouth 2 (two) times daily with a meal. 11/23/15   Brett Canales, PA-C  nitroGLYCERIN (NITROSTAT) 0.4 MG SL tablet PLACE 1 TABLET UNDER THE TONGUE AS NEEDED FOR CHEST PAIN. MAY REPEAT EVERY 5 MINUTES UP TO 3 DOSES. IF NO RELIEF AFTER 3 DOSES, CALL 911 OR 02/12/19   Satira Sark, MD  omega-3 acid ethyl esters (LOVAZA) 1 g capsule Take 1 capsule by mouth every morning. & 2 capsules at night 08/18/19   [provider]  [facility] VERIO test strip  10/10/17   [provider]  pantoprazole (PROTONIX) 40 MG tablet Take 40 mg by mouth daily.     [provider]  pravastatin (PRAVACHOL) 80 MG tablet TAKE ONE TABLET BY MOUTH EVERY EVENING 04/22/20   Satira Sark, MD  ranolazine (RANEXA) 1000 MG SR tablet TAKE ONE TABLET BY MOUTH 2 TIMES A DAY 04/22/20   Satira Sark, MD    Inpatient Medications: Scheduled Meds:  aspirin  81 mg Oral Pre-Cath   aspirin EC  81 mg Oral Daily   budesonide (PULMICORT) nebulizer solution  0.5 mg Nebulization BID   carvedilol  3.125 mg Oral BID WC   clopidogrel  75 mg  Oral Daily   insulin aspart  0-15 Units Subcutaneous TID WC   insulin aspart  0-5 Units Subcutaneous QHS   isosorbide mononitrate  60 mg Oral Daily   pantoprazole  40 mg Oral Daily   pravastatin  80 mg Oral QPM   ranolazine  1,000 mg Oral BID   sodium chloride flush  3 mL Intravenous Q12H   Continuous Infusions:  heparin 1,100 Units/hr (08/19/20 0458)   potassium chloride Stopped (08/19/20 0834)   PRN Meds: acetaminophen, albuterol, diazepam, nitroGLYCERIN, ondansetron (ZOFRAN) IV  Allergies:   Not on File  Social History:   Social History   Socioeconomic History   Marital status: Married    Spouse name: Not on file   Number of children: Not on file   Years of education: Not on file   Highest education level: Not on file  Occupational History   Not on file  Tobacco Use   Smoking status: Current Every Day Smoker    Packs/day: 0.80    Years: 33.00    Pack years: 26.40    Types: Cigarettes    Start date: 07/01/1978   Smokeless tobacco: Never Used   Tobacco comment: trying to quit, 3/4 pack per day. Started the nicotine patch on 01-10-14-   Vaping Use   Vaping Use: Never used  Substance and Sexual Activity   Alcohol use: No    Alcohol/week: 0.0 standard drinks    Comment: drink a can of beer occasionally every year    Drug use: Yes    Frequency: 2.0 times per week    Types: Marijuana   Sexual activity: Yes    Partners: Female  Other Topics Concern   Not on file  Social History Narrative   Married.    Social Determinants of Health   Financial Resource Strain:    Difficulty of Paying Living Expenses: Not on file  Food Insecurity:    Worried About Charity fundraiser in the Last Year: Not on file   [employer] of Food in the Last Year: Not on file  Transportation Needs:    Lack of Transportation (Medical): Not on file   Lack of Transportation (Non-Medical): Not on file  Physical Activity:    Days of Exercise per Week: Not on file    Minutes of Exercise per Session: Not on file  Stress:  Feeling of Stress : Not on file  Social Connections:    Frequency of Communication with Friends and Family: Not on file   Frequency of Social Gatherings with Friends and Family: Not on file   Attends Religious Services: Not on file   Active Member of Clubs or Organizations: Not on file   Attends Archivist Meetings: Not on file   Marital Status: Not on file  Intimate Partner Violence:    Fear of Current or Ex-Partner: Not on file   Emotionally Abused: Not on file   Physically Abused: Not on file   Sexually Abused: Not on file     Family History:    Family History  Problem Relation Age of Onset   Stroke Other        family Hx    Bowel Disease Mother    Hernia Father    Pneumonia Daughter       Review of Systems    General:  No chills, fever, night sweats or weight changes.  Cardiovascular:  No orthopnea, palpitations, paroxysmal nocturnal dyspnea. Positive for chest pain and dyspnea on exertion.  Dermatological: No rash, lesions/masses Respiratory: No cough, dyspnea Urologic: No hematuria, dysuria Abdominal:   No nausea, vomiting, diarrhea, bright red blood per rectum, melena, or hematemesis Neurologic:  No visual changes, wkns, changes in mental status. All other systems reviewed and are otherwise negative except as noted above.  Physical Exam/Data    Vitals:   08/19/20 0530 08/19/20 0600 08/19/20 0630 08/19/20 0808  BP: 99/64 107/69  119/78  Pulse: 69 73 72 84  Resp: (!) _0 (!) 32  Temp:      SpO2: 91% 91% 95% 92%  Weight:      Height:       No intake or output data in the 24 hours ending 08/19/20 0856 Filed Weights   08/19/20 0140  Weight: 95.3 kg   Body mass index is 36.05 kg/m.   General: Pleasant male appearing in NAD Psych: Normal affect. Neuro: Alert and oriented X 3. Moves all extremities spontaneously. HEENT: Normal  Neck: Supple without bruits or  JVD. Lungs:  Resp regular and unlabored, occasional scattered rhonchi. Heart: RRR no s3, s4, or murmurs. Abdomen: Soft, non-tender, non-distended, BS + x 4.  Extremities: No clubbing, cyanosis or lower extremity edema. DP/PT/Radials 2+ and equal bilaterally.   EKG:  The EKG was personally reviewed and demonstrates: NSR, HR 67 with inferior infarct pattern.   Telemetry:  Telemetry was personally reviewed and demonstrates: NSR, HR in 60's to 70's.    Labs/Studies     Relevant CV Studies:  Cardiac Catheterization: 11/2015 1. Ramus lesion, 95% stenosed.    Saphenous vein bypass graft disease with total occlusion of the SVG to the distal RCA. The sequential SVG to the diagonal and ramus intermedius is disease with occlusion of the limb to the ramus intermedius. The side-to-side anastomosis on the diagonal remains patent. The stents in the ostium and distal saphenous vein graft sites are widely patent.  Patent LIMA to LAD  Total occlusion of the native right. The distal right coronary/PDA fills by left-to-right collaterals from the LAD. Segmental diffuse disease in the left main. Total occlusion of the LAD. High-grade obstruction in the circumflex and ramus intermedius originating at the left main.  Widely patent LIMA to the LAD  When compared to the prior angiographic images, no significant change has occurred.  Inferior wall hypokinesis, estimated ejection fraction 45%  Recommendations:   Continue  medical management. No significant change in anatomy when compared to 01/28/2015.      Laboratory Data:  Chemistry Recent Labs  Lab 08/19/20 0156  NA 136  K 3.2*  CL 97*  CO2 29  GLUCOSE 111*  BUN 15  CREATININE 1.14  CALCIUM 9.1  GFRNONAA >60  ANIONGAP 10    No results for input(s): PROT, ALBUMIN, AST, ALT, ALKPHOS, BILITOT in the last 168 hours. Hematology Recent Labs  Lab 08/19/20 0156  WBC 7.5  RBC 3.35*  HGB 12.4*  HCT 36.3*  MCV 108.4*  MCH 37.0*   MCHC 34.2  RDW 14.2  PLT 194   Cardiac EnzymesNo results for input(s): TROPONINI in the last 168 hours. No results for input(s): TROPIPOC in the last 168 hours.  BNPNo results for input(s): BNP, PROBNP in the last 168 hours.  DDimer No results for input(s): DDIMER in the last 168 hours.  Radiology/Studies:  DG Chest Port 1 View  Result Date: 08/19/2020 CLINICAL DATA:  Mid chest pain EXAM: PORTABLE CHEST 1 VIEW COMPARISON:  01/15/2015 FINDINGS: Lungs are clear. No pneumothorax or pleural effusion. Coronary artery bypass grafting has been performed. Cardiac size within normal limits. Pulmonary vascularity is normal. No acute bone abnormality. IMPRESSION: No active disease. Electronically Signed   By: Fidela Salisbury MD   On: 08/19/2020 02:24     Assessment & Plan    1. Chest Pain concerning for Unstable Angina - He presents with episodes of more frequent and severe chest pain and dyspnea on exertion which have been responsive to SL NTG. Now having pain with minimal activity such as walking from room to room in his home.  - Initial HS Troponin 9 with repeat of 8. EKG shows NSR, HR 67 with inferior infarct pattern. Will order a repeat echocardiogram for reassessment of his EF given his prior ICM.  - Reviewed with Dr. Domenic Polite and will plan for a cardiac catheterization later today. The patient understands that risks include but are not limited to stroke (1 in 1000), death (1 in 2), kidney failure [usually temporary] (1 in 500), bleeding (1 in 200), allergic reaction [possibly serious] (1 in 200). He has been NPO since midnight. Card Master aware of transfer and he will be on the Cardiology service at [facility].  - Continue ASA, Plavix, BB, Imdur, Ranexa, statin and Heparin.   2. CAD - He is s/p CABG in 2003, BMS to [place] in 2010, PTCA and stenting to SVG-RCA in 01/2010 and PTCA alone to same vessel in 04/2010, CTO of SVG-RCA in 2013, and DES to SVG-D1-RI-OM1 in 2014. Most recent cath in 11/2015  showed a patent LIMA-LAD and SVG-D1-RI with occlusion of limb to RI with stents in the ostium and distal SVG remaining patent and medical management recommended. - Will plan for a repeat cardiac catheterization as outlined above.  - Continue ASA 42m daily, Plavix 738mdaily, Coreg 3.12553mID, Imdur 70m67mily and Ranexa 1000mg20m.    3. Ischemic Cardiomyopathy - EF was reduced at 45% by cath in 2017. He does report dyspnea on exertion and intermittent lower extremity edema. Will order a repeat echocardiogram for reassessment of his EF and wall motion. Continue Coreg 3.125mg 71m Will hold Lisinopril and Lasix in anticipation of cath.   4. HLD - FLP pending. Remains on Pravastatin 80mg d70m.   5. Type 2 DM - Hgb A1c pending. Metformin held in anticipation of cath. On SSI.   6. Tobacco Use - He continues to smoke approximately  1 ppd. Cessation advised.    For questions or updates, please contact [place] Please consult [email or web address] for contact info under Cardiology/STEMI.  Signed, Erma Heritage, PA-C 08/19/2020, 8:56 AM Pager: [phone]   Attending note:  Patient seen and examined, case discussed with Ms. Delano Metz and agree with her above findings.  Mr. Susy Manor presents to the ER with accelerating angina, baseline history of severe cardiovascular disease status post CABG with subsequently documented graft disease and PCI.  Most recent cardiac catheterization was in 2017 at which point medical therapy was recommended.  At that point he had a patent LIMA to LAD, patent SVG to first diagonal with occluded limb to the ramus intermedius, patent ostial and distal vein graft stents.  He reports with compliant with his medications including aspirin, Plavix, Coreg, Imdur, Pravachol and Ranexa.  He has longstanding tobacco use history, unfortunately has not been able to quit despite discussions and attempts.  On examination he is in no distress, chest pain resolved.  He is  afebrile, systolic blood pressure in the 110-120 range, heart rate in the 60s in sinus rhythm by telemetry which I personally reviewed.  Lungs exhibit decreased breath sounds without active wheezing, cardiac exam with RRR no gallop.  Pertinent lab work includes potassium 3.2, but creatinine 1.14, hemoglobin 12.4, platelets 194, normal high-sensitivity troponin I levels.  Chest x-ray reports no acute process.  I personally reviewed his ECG which shows sinus rhythm with old inferior infarct pattern and nonspecific ST changes.  Patient presents with accelerating angina, high-sensitivity troponin I levels are normal at this time.  ECG with nonspecific changes.  He has advanced cardiovascular disease as discussed above, unfortunately continued tobacco abuse.  Medical therapy is well-rounded, he reports compliance.  Plan is transfer to [facility] for a follow-up cardiac catheterization to evaluate for any potential revascularization strategies, realizing that this may be limited.  Risks and benefits reviewed and he is in agreement to proceed.  Continue heparin.  Satira Sark, M.D., F.A.C.C.

## 2020-08-19 NOTE — ED Provider Notes (Signed)
[facility] EMERGENCY DEPARTMENT Provider Note   CSN: [id number] Arrival date & time: 08/19/20  0124     History Chief Complaint  Patient presents with  . Chest Pain    [name] [name] is a 54 y.o. male.  The history is provided by the patient.  Chest Pain Pain location:  Substernal area Pain quality: tightness   Pain radiates to:  Does not radiate Pain severity:  Severe Onset quality:  Gradual Timing:  Constant Progression:  Improving Chronicity:  Recurrent Relieved by: rest. Worsened by:  Exertion Associated symptoms: cough and shortness of breath   Associated symptoms: no abdominal pain and no fever   Associated symptoms comment:  "smokers cough"   Patient with extensive history including CAD with history of bypass surgery, hypertension, GERD, ischemic cardiomyopathy presents with chest pain.  Patient reports long history of frequent episodes of chest pain.  Over the past day he has had increasing chest pain with exertion.  He reports associated shortness of breath.  He reports this chest pain is worse than prior episodes  He reports his cardiologist is wanting him to have a repeat catheterization.  Past Medical History:  Diagnosis Date  . Asthma   . Bipolar disorder ([place])   . Chronic back pain   . Coronary atherosclerosis of native coronary artery    a. CABG 2003. b. NSTEMI 2010 s/p BMS to SVG-RCA c. PTCA/stent/asp thromb to SVG-RCA 01/2010, PTCA to SVG-RCA 04/2010 d. known occlusion of SVG-RCA 2013. e. NSTEMI 07/2013  s/p PTCA/DES to SVG-diagonal/intermediate/OM1.   . Essential hypertension   . GERD (gastroesophageal reflux disease)   . Gout   . Ischemic cardiomyopathy    a. EF 35-40% by cath 07/2013.  . Mixed hyperlipidemia   . Sinus bradycardia   . Type 2 diabetes mellitus [facility])     Patient Active Problem List   Diagnosis Date Noted  . Hypotension due to drugs 11/22/2015  . Accelerating angina ([place]) 01/28/2015  . CAD S/P percutaneous coronary  angioplasty - PCI of SVG-OM 01/28/2015  . Polysubstance abuse ([place]) 01/28/2015  . Coronary artery disease involving coronary bypass graft of native heart with unstable angina pectoris ([place]) 01/28/2015  . Exertional angina ([place]) 01/16/2015  . Chest pain 01/16/2015  . Coronary atherosclerosis of native coronary artery 06/20/2014  . Cardiomyopathy, ischemic 07/15/2013  . Essential hypertension, benign 12/05/2012  . Sinus bradycardia 12/05/2012  . TOBACCO USER 08/10/2009  . Mixed hyperlipidemia 06/17/2009    Past Surgical History:  Procedure Laterality Date  . CARDIAC CATHETERIZATION  06/19/2014   Procedure: CORONARY STENT INTERVENTION;  Surgeon: Leonie Man, MD;  Location: [facility] CATH LAB;  Service: Cardiovascular;;  DES to seg SVG Diag/OM/[place]   . CARDIAC CATHETERIZATION N/A 11/22/2015   Procedure: Left Heart Cath and Coronary Angiography;  Surgeon: Belva Crome, MD;  Location: [place] CV LAB;  Service: Cardiovascular;  Laterality: N/A;  . CHOLECYSTECTOMY    . CORONARY ARTERY BYPASS GRAFT     2003, LIMA to LAD; SVG to diagonal; SVG to OM, ramus, and diagonal; SVG to RCA  . LEFT HEART CATHETERIZATION WITH CORONARY ANGIOGRAM N/A 01/28/2015   Procedure: LEFT HEART CATHETERIZATION WITH CORONARY ANGIOGRAM;  Surgeon: Leonie Man, MD;  Location: [facility] CATH LAB;  Service: Cardiovascular;  Laterality: N/A;  . LEFT HEART CATHETERIZATION WITH CORONARY/GRAFT ANGIOGRAM N/A 07/14/2013   Procedure: LEFT HEART CATHETERIZATION WITH Beatrix Fetters;  Surgeon: Burnell Blanks, MD;  Location: [facility] CATH LAB;  Service: Cardiovascular;  Laterality: N/A;  .  LEFT HEART CATHETERIZATION WITH CORONARY/GRAFT ANGIOGRAM N/A 12/29/2013   Procedure: LEFT HEART CATHETERIZATION WITH Beatrix Fetters;  Surgeon: Leonie Man, MD;  Location: [facility] CATH LAB;  Service: Cardiovascular;  Laterality: N/A;  . LEFT HEART CATHETERIZATION WITH CORONARY/GRAFT ANGIOGRAM N/A 06/19/2014   Procedure: LEFT HEART  CATHETERIZATION WITH Beatrix Fetters;  Surgeon: Leonie Man, MD;  Location: [facility] CATH LAB;  Service: Cardiovascular;  Laterality: N/A;  . Metal plate to chin    . PERCUTANEOUS CORONARY STENT INTERVENTION (PCI-S)  07/14/2013   Procedure: PERCUTANEOUS CORONARY STENT INTERVENTION (PCI-S);  Surgeon: Burnell Blanks, MD;  Location: [facility] CATH LAB;  Service: Cardiovascular;;  . Pin placement left leg         Family History  Problem Relation Age of Onset  . Stroke Other        family Hx   . Bowel Disease Mother   . Hernia Father   . Pneumonia Daughter     Social History   Tobacco Use  . Smoking status: Current Every Day Smoker    Packs/day: 0.80    Years: 33.00    Pack years: 26.40    Types: Cigarettes    Start date: 07/01/1978  . Smokeless tobacco: Never Used  . Tobacco comment: trying to quit, 3/4 pack per day. Started the nicotine patch on 01-10-14-   Vaping Use  . Vaping Use: Never used  Substance Use Topics  . Alcohol use: No    Alcohol/week: 0.0 standard drinks    Comment: drink a can of beer occasionally every year   . Drug use: Yes    Frequency: 2.0 times per week    Types: Marijuana    Home Medications Prior to Admission medications   Medication Sig Start Date End Date Taking? Authorizing Provider  acetaminophen (TYLENOL) 325 MG tablet Take 2 tablets (650 mg total) by mouth every 4 (four) hours as needed for headache or mild pain. 06/20/14   Erlene Quan, PA-C  albuterol (PROVENTIL HFA;VENTOLIN HFA) 108 (90 BASE) MCG/ACT inhaler Inhale 2 puffs into the lungs every 6 (six) hours as needed for wheezing or shortness of breath.    [provider]  allopurinol (ZYLOPRIM) 300 MG tablet Take 300 mg by mouth daily. 11/03/15   [provider]  aspirin EC 81 MG tablet Take 1 tablet (81 mg total) by mouth daily. 12/05/12   Donney Dice, PA-C  Blood Pressure Monitoring KIT 1 each by Does not apply route as directed. 09/29/14   Satira Sark, MD   carvedilol (COREG) 3.125 MG tablet TAKE ONE TABLET BY MOUTH 2 TIMES A DAY WITH A MEAL 12/26/19   Satira Sark, MD  clopidogrel (PLAVIX) 75 MG tablet TAKE ONE TABLET BY MOUTH DAILY 04/22/20   Satira Sark, MD  diazepam (VALIUM) 10 MG tablet Take 10 mg by mouth 2 (two) times daily.    [provider]  FLOVENT HFA 220 MCG/ACT inhaler Inhale 1 puff into the lungs daily.  10/10/17   [provider]  furosemide (LASIX) 20 MG tablet TAKE ONE TABLET BY MOUTH EVERY NIGHT AT BEDTIME 12/26/19   Satira Sark, MD  isosorbide mononitrate (IMDUR) 60 MG 24 hr tablet TAKE ONE TABLET BY MOUTH 2 TIMES A DAY 01/15/20   Satira Sark, MD  lisinopril (ZESTRIL) 2.5 MG tablet TAKE ONE TABLET BY MOUTH DAILY 12/26/19   Satira Sark, MD  Magnesium 200 MG TABS Take by mouth 2 (two) times daily.  [provider]  metFORMIN (GLUCOPHAGE) 1000 MG tablet Take 1 tablet (1,000 mg total) by mouth 2 (two) times daily with a meal. 11/23/15   Brett Canales, PA-C  nitroGLYCERIN (NITROSTAT) 0.4 MG SL tablet PLACE 1 TABLET UNDER THE TONGUE AS NEEDED FOR CHEST PAIN. MAY REPEAT EVERY 5 MINUTES UP TO 3 DOSES. IF NO RELIEF AFTER 3 DOSES, CALL 911 OR 02/12/19   Satira Sark, MD  omega-3 acid ethyl esters (LOVAZA) 1 g capsule Take 1 capsule by mouth every morning. & 2 capsules at night 08/18/19   [provider]  [facility] VERIO test strip  10/10/17   [provider]  pantoprazole (PROTONIX) 40 MG tablet Take 40 mg by mouth daily.     [provider]  pravastatin (PRAVACHOL) 80 MG tablet TAKE ONE TABLET BY MOUTH EVERY EVENING 04/22/20   Satira Sark, MD  ranolazine (RANEXA) 1000 MG SR tablet TAKE ONE TABLET BY MOUTH 2 TIMES A DAY 04/22/20   Satira Sark, MD    Allergies    Patient has no allergy information on record.  Review of Systems   Review of Systems  Constitutional: Negative for fever.  Respiratory: Positive for cough and shortness of breath.     Cardiovascular: Positive for chest pain.  Gastrointestinal: Negative for abdominal pain, anal bleeding and blood in stool.  All other systems reviewed and are negative.   Physical Exam Updated Vital Signs BP 121/80   Pulse 68   Temp 98.2 F (36.8 C)   Resp (!) 21   Ht 1.626 m (5' 4" )   Wt 95.3 kg   SpO2 93%   BMI 36.05 kg/m   Physical Exam CONSTITUTIONAL: Well developed, appears older than stated age HEAD: Normocephalic/atraumatic EYES: EOMI/PERRL ENMT: Mucous membranes moist NECK: supple no meningeal signs SPINE/BACK:entire spine nontender CV: S1/S2 noted, no murmurs/rubs/gallops noted LUNGS: Scattered wheeze, no apparent distress ABDOMEN: soft, nontender, no rebound or guarding, bowel sounds noted throughout abdomen GU:no cva tenderness NEURO: Pt is awake/alert/appropriate, moves all extremitiesx4.  No facial droop.   EXTREMITIES: pulses normal/equal, full ROM, no lower extremity edema or tenderness SKIN: warm, color normal PSYCH: no abnormalities of mood noted, alert and oriented to situation  ED Results / Procedures / Treatments   Labs (all labs ordered are listed, but only abnormal results are displayed) Labs Reviewed  BASIC METABOLIC PANEL - Abnormal; Notable for the following components:      Result Value   Potassium 3.2 (*)    Chloride 97 (*)    Glucose, Bld 111 (*)    All other components within normal limits  CBC - Abnormal; Notable for the following components:   RBC 3.35 (*)    Hemoglobin 12.4 (*)    HCT 36.3 (*)    MCV 108.4 (*)    MCH 37.0 (*)    All other components within normal limits  RESPIRATORY PANEL BY RT PCR (FLU A&B, COVID)  HEPARIN LEVEL (UNFRACTIONATED)  TROPONIN I (HIGH SENSITIVITY)  TROPONIN I (HIGH SENSITIVITY)    EKG EKG Interpretation  Date/Time:  Thursday August 19 2020 01:38:37 EST Ventricular Rate:  67 PR Interval:  194 QRS Duration: 78 QT Interval:  406 QTC Calculation: 429 R Axis:   68 Text  Interpretation: Normal sinus rhythm Possible Inferior infarct , age undetermined Abnormal ECG Interpretation limited secondary to artifact Confirmed by Ripley Fraise ([id number]) on 08/19/2020 2:00:06 AM   Radiology DG Chest Port 1 View  Result Date: 08/19/2020 CLINICAL DATA:  Mid chest pain EXAM: PORTABLE CHEST 1 VIEW COMPARISON:  01/15/2015 FINDINGS: Lungs are clear. No pneumothorax or pleural effusion. Coronary artery bypass grafting has been performed. Cardiac size within normal limits. Pulmonary vascularity is normal. No acute bone abnormality. IMPRESSION: No active disease. Electronically Signed   By: Fidela Salisbury MD   On: 08/19/2020 02:24    Procedures .Critical Care Performed by: Ripley Fraise, MD Authorized by: Ripley Fraise, MD   Critical care provider statement:    Critical care time (minutes):  45   Critical care start time:  08/19/2020 4:30 AM   Critical care end time:  08/19/2020 5:15 AM   Critical care time was exclusive of:  Separately billable procedures and treating other patients   Critical care was necessary to treat or prevent imminent or life-threatening deterioration of the following conditions:  Cardiac failure   Critical care was time spent personally by me on the following activities:  Development of treatment plan with patient or surrogate, discussions with consultants, evaluation of patient's response to treatment, examination of patient, obtaining history from patient or surrogate, review of old charts, re-evaluation of patient's condition, pulse oximetry, ordering and review of radiographic studies, ordering and review of laboratory studies and ordering and performing treatments and interventions   I assumed direction of critical care for this patient from another provider in my specialty: no     Medications Ordered in ED Medications  nitroGLYCERIN (NITROSTAT) SL tablet 0.4 mg (has no administration in time range)  heparin ADULT infusion 100 units/mL  (25000 units/275m sodium chloride 0.45%) (1,100 Units/hr Intravenous New Bag/Given 08/19/20 0458)  aspirin chewable tablet 324 mg (324 mg Oral Given 08/19/20 0430)  heparin bolus via infusion 4,000 Units (4,000 Units Intravenous Bolus from Bag 08/19/20 0459)    ED Course  I have reviewed the triage vital signs and the nursing notes.  Pertinent labs & imaging results that were available during my care of the patient were reviewed by me and considered in my medical decision making (see chart for details).    MDM Rules/Calculators/A&P                          4:37 AM Patient with extensive cardiac history including previous CABG as well as cardiac catheterization in 2017. Patient reports that this episode is worse than prior episodes.  This episode began after walking to the bathroom and lasted for quite some time.  He is now close to chest pain-free.  He reports that is similar to prior angina. He reports medication compliance, but he is still smoking. Strong suspicion this represents unstable angina. He will be given aspirin, heparin for unstable angina, and nitroglycerin as needed.  Will consult cardiology 5:57 [facility]labs findings [facility] later in the day Pt is awake/alert and CP free at this time D/w Dr ZJosph Machofor admission  Final Clinical Impression(s) / ED Diagnoses Final diagnoses:  Unstable angina ([facility]    Rx / DC Orders ED Discharge Orders    None       WRipley Fraise MD 08/19/20 0559

## 2020-08-19 NOTE — ED Triage Notes (Signed)
Pt c/o central chest pain and back pain. Pt states that his cardiologist has been wanting him to get a cardiac cath x 2 years and states that now the pain is worse so he thought he should come in.

## 2020-08-19 NOTE — ED Notes (Signed)
Phlebotomist to bedside to obtain serum labs.

## 2020-08-19 NOTE — Progress Notes (Addendum)
Patient complaining of 8/10 chest pain. EKG taken. Charge nurse notified. 2 sublingual nitro given 5 mins apart. On call MD Akhter notified. After 2 Nitros patient pain CP has subsided. BP following last nitroglycerin: 118/72.

## 2020-08-19 NOTE — Progress Notes (Signed)
ANTICOAGULATION CONSULT NOTE -   Pharmacy Consult for heparin Indication: chest pain/ACS  Not on File  Patient Measurements: Height: 5\' 4"  (162.6 cm) Weight: 95.3 kg (210 lb) IBW/kg (Calculated) : 59.2 Heparin Dosing Weight: 80kg  Vital Signs: Temp: 98.2 F (36.8 C) (11/11 0338) BP: 110/64 (11/11 1230) Pulse Rate: 76 (11/11 1230)  Labs: Recent Labs    08/19/20 0156 08/19/20 0344 08/19/20 1221  HGB 12.4*  --   --   HCT 36.3*  --   --   PLT 194  --   --   HEPARINUNFRC  --   --  0.18*  CREATININE 1.14  --   --   TROPONINIHS 9 8  --     Estimated Creatinine Clearance: 78 mL/min (by C-G formula based on SCr of 1.14 mg/dL).   Medical History: Past Medical History:  Diagnosis Date  . Asthma   . Bipolar disorder ([facility])   . Chronic back pain   . Coronary atherosclerosis of native coronary artery    a. CABG 2003. b. NSTEMI 2010 s/p BMS to SVG-RCA c. PTCA/stent/asp thromb to SVG-RCA 01/2010, PTCA to SVG-RCA 04/2010 d. known occlusion of SVG-RCA 2013. e. NSTEMI 07/2013  s/p PTCA/DES to SVG-diagonal/intermediate/OM1.   . Essential hypertension   . GERD (gastroesophageal reflux disease)   . Gout   . Ischemic cardiomyopathy    a. EF 35-40% by cath 07/2013.  . Mixed hyperlipidemia   . Sinus bradycardia   . Type 2 diabetes mellitus ([facility])     Assessment: 54yo male c/o pain to chest and back, notes that cards has recommended cardiac cath x34yr but pt has declined, initial troponin negative, to begin heparin.  HL 0.18- subtherapeutic  Goal of Therapy:  Heparin level 0.3-0.7 units/ml Monitor platelets by anticoagulation protocol: Yes   Plan:  Rebolus heparin 2500 units IV  Increase heparin infusion to 1350 units/hr Heparin level in 6 hours and daily. Monitor H&H and s/s of bleeding.

## 2020-08-19 NOTE — Hospital Course (Signed)
54yom PMH DM type 2, CAD, CABG, stent, ischemic cardiomyopathy, acitve smoker presented w/ CP concerning for [place]. Cardiology rec's transfer to [facility] under their service for [facility].

## 2020-08-19 NOTE — Progress Notes (Signed)
*  PRELIMINARY RESULTS* Echocardiogram 2D Echocardiogram has been performed.  [name] [name] 08/19/2020, 9:39 AM

## 2020-08-19 NOTE — Progress Notes (Signed)
ANTICOAGULATION CONSULT NOTE - Initial Consult  Pharmacy Consult for heparin Indication: chest pain/ACS  Not on File  Patient Measurements: Height: 5\' 4"  (162.6 cm) Weight: 95.3 kg (210 lb) IBW/kg (Calculated) : 59.2 Heparin Dosing Weight: 80kg  Vital Signs: Temp: 98.2 F (36.8 C) (11/11 0338) BP: 121/80 (11/11 0400) Pulse Rate: 68 (11/11 0400)  Labs: Recent Labs    08/19/20 0156  HGB 12.4*  HCT 36.3*  PLT 194  CREATININE 1.14  TROPONINIHS 9    Estimated Creatinine Clearance: 78 mL/min (by C-G formula based on SCr of 1.14 mg/dL).   Medical History: Past Medical History:  Diagnosis Date  . Asthma   . Bipolar disorder ([facility])   . Chronic back pain   . Coronary atherosclerosis of native coronary artery    a. CABG 2003. b. NSTEMI 2010 s/p BMS to SVG-RCA c. PTCA/stent/asp thromb to SVG-RCA 01/2010, PTCA to SVG-RCA 04/2010 d. known occlusion of SVG-RCA 2013. e. NSTEMI 07/2013  s/p PTCA/DES to SVG-diagonal/intermediate/OM1.   . Essential hypertension   . GERD (gastroesophageal reflux disease)   . Gout   . Ischemic cardiomyopathy    a. EF 35-40% by cath 07/2013.  . Mixed hyperlipidemia   . Sinus bradycardia   . Type 2 diabetes mellitus (H[facility])     Assessment: 54yo male c/o pain to chest and back, notes that cards has recommended cardiac cath x26yr but pt has declined, initial troponin negative, to begin heparin.  Goal of Therapy:  Heparin level 0.3-0.7 units/ml Monitor platelets by anticoagulation protocol: Yes   Plan:  Will give heparin 4000 units IV bolus x1 followed by gtt at 1100 units/hr and monitor heparin levels and CBC.  0yr, PharmD, BCPS  08/19/2020,4:26 AM

## 2020-08-19 NOTE — Consult Note (Addendum)
Cardiology Consult    Patient ID: [name] [name]; [id number]; 1966/04/26   Admit date: 08/19/2020 Date of Consult: 08/19/2020  Primary Care Provider: [name], MD Primary Cardiologist: [name], MD   Patient Profile    [name] [name] is a 54 y.o. male with past medical history of CAD (s/p CABG in 2003, BMS to [place] in 2010, PTCA and stenting to [place] in 01/2010 and PTCA alone to same vessel in 04/2010, CTO of SVG-RCA in 2013, DES to SVG-D1-RI-OM1 in 2014, cath in 11/2015 showing patent LIMA-LAD and SVG-D1-RI with occlusion of limb to RI with stents in the ostium and distal SVG remaining patent and medical management recommended), ischemic cardiomyopathy (EF 45% by cath in 11/2015), HTN, HLD, Type 2 DM and tobacco use who is being seen today for the evaluation of chest pain at the request of Dr. Clearence Ped.   History of Present Illness    [name] [name] was last examined by Dr. Domenic Polite in 02/2020 and he reported more frequent episodes of angina and was having to utilize sublingual nitroglycerin more frequently. He reported compliance with his medical therapy and by review of notes, a repeat cardiac catheterization was recommended but it does not appear this was pursued.  He presented to [facility] ED during the early morning hours of 08/19/2020 for evaluation of progressive chest pain and back pain. In talking with the patient today, he reports episodes of progressive chest pain since his office visit in 02/2020 but kept putting off the procedure. He eventually came to the ED for evaluation after several family members encouraged him to do so. Says he was initially only having chest pressure with walking for long distances but over the past few weeks he has developed chest pain with walking from room to room in his home. Reports associated dyspnea. Also reports some episodes of pain at night which awake him from sleep. Pain typically resolves with 1-2 SL NTG. No recent  orthopnea or PND. Has experienced intermittent lower extremity edema. He reports good compliance with his current medication regimen and denies missing any recent doses. Continues to smoke approximately 1 ppd.   Initial labs show WBC 7.5, Hgb 12.4, platelets 194, Na+ 136, K+ 3.2 and creatinine 1.14. Initial HS Troponin 9 with repeat of 8. COVID negative. FLP and Hgb A1c pending. CXR with no active cardiopulmonary disease. EKG shows NSR, HR 67 with inferior infarct pattern.   He has been continued on PTA ASA, Plavix, Coreg, Imdur, Ranexa and Pravastatin along with Heparin being initiated.   Past Medical History:  Diagnosis Date  . Asthma   . Bipolar disorder ([place])   . Chronic back pain   . Coronary atherosclerosis of native coronary artery    a. CABG 2003. b. NSTEMI 2010 s/p BMS to SVG-RCA c. PTCA/stent/asp thromb to SVG-RCA 01/2010, PTCA to SVG-RCA 04/2010 d. known occlusion of SVG-RCA 2013. e. NSTEMI 07/2013  s/p PTCA/DES to SVG-diagonal/intermediate/OM1.   . Essential hypertension   . GERD (gastroesophageal reflux disease)   . Gout   . Ischemic cardiomyopathy    a. EF 35-40% by cath 07/2013.  . Mixed hyperlipidemia   . Sinus bradycardia   . Type 2 diabetes mellitus ([place])     Past Surgical History:  Procedure Laterality Date  . CARDIAC CATHETERIZATION  06/19/2014   Procedure: CORONARY STENT INTERVENTION;  Surgeon: Leonie Man, MD;  Location: [facility] CATH LAB;  Service: Cardiovascular;;  DES to seg SVG Diag/OM/[place]   . CARDIAC CATHETERIZATION  N/A 11/22/2015   Procedure: Left Heart Cath and Coronary Angiography;  Surgeon: Belva Crome, MD;  Location: [place] CV LAB;  Service: Cardiovascular;  Laterality: N/A;  . CHOLECYSTECTOMY    . CORONARY ARTERY BYPASS GRAFT     2003, LIMA to LAD; SVG to diagonal; SVG to OM, ramus, and diagonal; SVG to RCA  . LEFT HEART CATHETERIZATION WITH CORONARY ANGIOGRAM N/A 01/28/2015   Procedure: LEFT HEART CATHETERIZATION WITH CORONARY ANGIOGRAM;  Surgeon:  Leonie Man, MD;  Location: [facility] CATH LAB;  Service: Cardiovascular;  Laterality: N/A;  . LEFT HEART CATHETERIZATION WITH CORONARY/GRAFT ANGIOGRAM N/A 07/14/2013   Procedure: LEFT HEART CATHETERIZATION WITH Beatrix Fetters;  Surgeon: Burnell Blanks, MD;  Location: [facility] CATH LAB;  Service: Cardiovascular;  Laterality: N/A;  . LEFT HEART CATHETERIZATION WITH CORONARY/GRAFT ANGIOGRAM N/A 12/29/2013   Procedure: LEFT HEART CATHETERIZATION WITH Beatrix Fetters;  Surgeon: Leonie Man, MD;  Location: [facility] CATH LAB;  Service: Cardiovascular;  Laterality: N/A;  . LEFT HEART CATHETERIZATION WITH CORONARY/GRAFT ANGIOGRAM N/A 06/19/2014   Procedure: LEFT HEART CATHETERIZATION WITH Beatrix Fetters;  Surgeon: Leonie Man, MD;  Location: [facility] CATH LAB;  Service: Cardiovascular;  Laterality: N/A;  . Metal plate to chin    . PERCUTANEOUS CORONARY STENT INTERVENTION (PCI-S)  07/14/2013   Procedure: PERCUTANEOUS CORONARY STENT INTERVENTION (PCI-S);  Surgeon: Burnell Blanks, MD;  Location: [facility] CATH LAB;  Service: Cardiovascular;;  . Pin placement left leg       Home Medications:  Prior to Admission medications   Medication Sig Start Date End Date Taking? Authorizing Provider  acetaminophen (TYLENOL) 325 MG tablet Take 2 tablets (650 mg total) by mouth every 4 (four) hours as needed for headache or mild pain. 06/20/14   Erlene Quan, PA-C  albuterol (PROVENTIL HFA;VENTOLIN HFA) 108 (90 BASE) MCG/ACT inhaler Inhale 2 puffs into the lungs every 6 (six) hours as needed for wheezing or shortness of breath.    [provider]  allopurinol (ZYLOPRIM) 300 MG tablet Take 300 mg by mouth daily. 11/03/15   [provider]  aspirin EC 81 MG tablet Take 1 tablet (81 mg total) by mouth daily. 12/05/12   Donney Dice, PA-C  Blood Pressure Monitoring KIT 1 each by Does not apply route as directed. 09/29/14   Satira Sark, MD  carvedilol (COREG) 3.125 MG tablet  TAKE ONE TABLET BY MOUTH 2 TIMES A DAY WITH A MEAL 12/26/19   Satira Sark, MD  clopidogrel (PLAVIX) 75 MG tablet TAKE ONE TABLET BY MOUTH DAILY 04/22/20   Satira Sark, MD  diazepam (VALIUM) 10 MG tablet Take 10 mg by mouth 2 (two) times daily.    [provider]  FLOVENT HFA 220 MCG/ACT inhaler Inhale 1 puff into the lungs daily.  10/10/17   [provider]  furosemide (LASIX) 20 MG tablet TAKE ONE TABLET BY MOUTH EVERY NIGHT AT BEDTIME 12/26/19   Satira Sark, MD  isosorbide mononitrate (IMDUR) 60 MG 24 hr tablet TAKE ONE TABLET BY MOUTH 2 TIMES A DAY 01/15/20   Satira Sark, MD  lisinopril (ZESTRIL) 2.5 MG tablet TAKE ONE TABLET BY MOUTH DAILY 12/26/19   Satira Sark, MD  Magnesium 200 MG TABS Take by mouth 2 (two) times daily.    [provider]  metFORMIN (GLUCOPHAGE) 1000 MG tablet Take 1 tablet (1,000 mg total) by mouth 2 (two) times daily with a meal. 11/23/15   Brett Canales, PA-C  nitroGLYCERIN (NITROSTAT) 0.4 MG SL tablet PLACE 1 TABLET UNDER THE TONGUE AS NEEDED FOR CHEST PAIN. MAY REPEAT EVERY 5 MINUTES UP TO 3 DOSES. IF NO RELIEF AFTER 3 DOSES, CALL 911 OR 02/12/19   Satira Sark, MD  omega-3 acid ethyl esters (LOVAZA) 1 g capsule Take 1 capsule by mouth every morning. & 2 capsules at night 08/18/19   [provider]  [facility] VERIO test strip  10/10/17   [provider]  pantoprazole (PROTONIX) 40 MG tablet Take 40 mg by mouth daily.     [provider]  pravastatin (PRAVACHOL) 80 MG tablet TAKE ONE TABLET BY MOUTH EVERY EVENING 04/22/20   Satira Sark, MD  ranolazine (RANEXA) 1000 MG SR tablet TAKE ONE TABLET BY MOUTH 2 TIMES A DAY 04/22/20   Satira Sark, MD    Inpatient Medications: Scheduled Meds: . aspirin  81 mg Oral Pre-Cath  . aspirin EC  81 mg Oral Daily  . budesonide (PULMICORT) nebulizer solution  0.5 mg Nebulization BID  . carvedilol  3.125 mg Oral BID WC  . clopidogrel  75 mg  Oral Daily  . insulin aspart  0-15 Units Subcutaneous TID WC  . insulin aspart  0-5 Units Subcutaneous QHS  . isosorbide mononitrate  60 mg Oral Daily  . pantoprazole  40 mg Oral Daily  . pravastatin  80 mg Oral QPM  . ranolazine  1,000 mg Oral BID  . sodium chloride flush  3 mL Intravenous Q12H   Continuous Infusions: . heparin 1,100 Units/hr (08/19/20 0458)  . potassium chloride Stopped (08/19/20 0834)   PRN Meds: acetaminophen, albuterol, diazepam, nitroGLYCERIN, ondansetron (ZOFRAN) IV  Allergies:   Not on File  Social History:   Social History   Socioeconomic History  . Marital status: Married    Spouse name: Not on file  . Number of children: Not on file  . Years of education: Not on file  . Highest education level: Not on file  Occupational History  . Not on file  Tobacco Use  . Smoking status: Current Every Day Smoker    Packs/day: 0.80    Years: 33.00    Pack years: 26.40    Types: Cigarettes    Start date: 07/01/1978  . Smokeless tobacco: Never Used  . Tobacco comment: trying to quit, 3/4 pack per day. Started the nicotine patch on 01-10-14-   Vaping Use  . Vaping Use: Never used  Substance and Sexual Activity  . Alcohol use: No    Alcohol/week: 0.0 standard drinks    Comment: drink a can of beer occasionally every year   . Drug use: Yes    Frequency: 2.0 times per week    Types: Marijuana  . Sexual activity: Yes    Partners: Female  Other Topics Concern  . Not on file  Social History Narrative   Married.    Social Determinants of Health   Financial Resource Strain:   . Difficulty of Paying Living Expenses: Not on file  Food Insecurity:   . Worried About Charity fundraiser in the Last Year: Not on file  . Ran Out of Food in the Last Year: Not on file  Transportation Needs:   . Lack of Transportation (Medical): Not on file  . Lack of Transportation (Non-Medical): Not on file  Physical Activity:   . Days of Exercise per Week: Not on file  .  Minutes of Exercise per Session: Not on file  Stress:   .  Feeling of Stress : Not on file  Social Connections:   . Frequency of Communication with Friends and Family: Not on file  . Frequency of Social Gatherings with Friends and Family: Not on file  . Attends Religious Services: Not on file  . Active Member of Clubs or Organizations: Not on file  . Attends Archivist Meetings: Not on file  . Marital Status: Not on file  Intimate Partner Violence:   . Fear of Current or Ex-Partner: Not on file  . Emotionally Abused: Not on file  . Physically Abused: Not on file  . Sexually Abused: Not on file     Family History:    Family History  Problem Relation Age of Onset  . Stroke Other        family Hx   . Bowel Disease Mother   . Hernia Father   . Pneumonia Daughter       Review of Systems    General:  No chills, fever, night sweats or weight changes.  Cardiovascular:  No orthopnea, palpitations, paroxysmal nocturnal dyspnea. Positive for chest pain and dyspnea on exertion.  Dermatological: No rash, lesions/masses Respiratory: No cough, dyspnea Urologic: No hematuria, dysuria Abdominal:   No nausea, vomiting, diarrhea, bright red blood per rectum, melena, or hematemesis Neurologic:  No visual changes, wkns, changes in mental status. All other systems reviewed and are otherwise negative except as noted above.  Physical Exam/Data    Vitals:   08/19/20 0530 08/19/20 0600 08/19/20 0630 08/19/20 0808  BP: 99/64 107/69  119/78  Pulse: 69 73 72 84  Resp: (!) _0 (!) 32  Temp:      SpO2: 91% 91% 95% 92%  Weight:      Height:       No intake or output data in the 24 hours ending 08/19/20 0856 Filed Weights   08/19/20 0140  Weight: 95.3 kg   Body mass index is 36.05 kg/m.   General: Pleasant male appearing in NAD Psych: Normal affect. Neuro: Alert and oriented X 3. Moves all extremities spontaneously. HEENT: Normal  Neck: Supple without bruits or  JVD. Lungs:  Resp regular and unlabored, occasional scattered rhonchi. Heart: RRR no s3, s4, or murmurs. Abdomen: Soft, non-tender, non-distended, BS + x 4.  Extremities: No clubbing, cyanosis or lower extremity edema. DP/PT/Radials 2+ and equal bilaterally.   EKG:  The EKG was personally reviewed and demonstrates: NSR, HR 67 with inferior infarct pattern.   Telemetry:  Telemetry was personally reviewed and demonstrates: NSR, HR in 60's to 70's.    Labs/Studies     Relevant CV Studies:  Cardiac Catheterization: 11/2015 1. Ramus lesion, 95% stenosed.    Saphenous vein bypass graft disease with total occlusion of the SVG to the distal RCA. The sequential SVG to the diagonal and ramus intermedius is disease with occlusion of the limb to the ramus intermedius. The side-to-side anastomosis on the diagonal remains patent. The stents in the ostium and distal saphenous vein graft sites are widely patent.  Patent LIMA to LAD  Total occlusion of the native right. The distal right coronary/PDA fills by left-to-right collaterals from the LAD. Segmental diffuse disease in the left main. Total occlusion of the LAD. High-grade obstruction in the circumflex and ramus intermedius originating at the left main.  Widely patent LIMA to the LAD  When compared to the prior angiographic images, no significant change has occurred.  Inferior wall hypokinesis, estimated ejection fraction 45%  Recommendations:   Continue  medical management. No significant change in anatomy when compared to 01/28/2015. ° ° ° ° ° °Laboratory Data: ° °Chemistry °Recent Labs  °Lab 08/19/20 °0156  °NA 136  °K 3.2*  °CL 97*  °CO2 29  °GLUCOSE 111*  °BUN 15  °CREATININE 1.14  °CALCIUM 9.1  °GFRNONAA >60  °ANIONGAP 10  °  °No results for input(s): PROT, ALBUMIN, AST, ALT, ALKPHOS, BILITOT in the last 168 hours. °Hematology °Recent Labs  °Lab 08/19/20 °0156  °WBC 7.5  °RBC 3.35*  °HGB 12.4*  °HCT 36.3*  °MCV 108.4*  °MCH 37.0*   °MCHC 34.2  °RDW 14.2  °PLT 194  ° °Cardiac EnzymesNo results for input(s): TROPONINI in the last 168 hours. No results for input(s): TROPIPOC in the last 168 hours.  °BNPNo results for input(s): BNP, PROBNP in the last 168 hours.  °DDimer No results for input(s): DDIMER in the last 168 hours. ° °Radiology/Studies:  °DG Chest Port 1 View ° °Result Date: 08/19/2020 °CLINICAL DATA:  Mid chest pain EXAM: PORTABLE CHEST 1 VIEW COMPARISON:  01/15/2015 FINDINGS: Lungs are clear. No pneumothorax or pleural effusion. Coronary artery bypass grafting has been performed. Cardiac size within normal limits. Pulmonary vascularity is normal. No acute bone abnormality. IMPRESSION: No active disease. Electronically Signed   By: Ashesh  Parikh MD   On: 08/19/2020 02:24  ° ° ° °Assessment & Plan  °  °1. Chest Pain concerning for Unstable Angina °- He presents with episodes of more frequent and severe chest pain and dyspnea on exertion which have been responsive to SL NTG. Now having pain with minimal activity such as walking from room to room in his home.  °- Initial HS Troponin 9 with repeat of 8. EKG shows NSR, HR 67 with inferior infarct pattern. Will order a repeat echocardiogram for reassessment of his EF given his prior ICM.  °- Reviewed with Dr. Shanessa Hodak and will plan for a cardiac catheterization later today. The patient understands that risks include but are not limited to stroke (1 in 1000), death (1 in 1000), kidney failure [usually temporary] (1 in 500), bleeding (1 in 200), allergic reaction [possibly serious] (1 in 200). He has been NPO since midnight. Card Master aware of transfer and he will be on the Cardiology service at [facility].  °- Continue ASA, Plavix, BB, Imdur, Ranexa, statin and Heparin.  ° °2. CAD °- He is s/p CABG in 2003, BMS to SVG-RCA in 2010, PTCA and stenting to SVG-RCA in 01/2010 and PTCA alone to same vessel in 04/2010, CTO of SVG-RCA in 2013, and DES to SVG-D1-RI-OM1 in 2014. Most recent cath in 11/2015  showed a patent LIMA-LAD and SVG-D1-RI with occlusion of limb to RI with stents in the ostium and distal SVG remaining patent and medical management recommended. °- Will plan for a repeat cardiac catheterization as outlined above.  °- Continue ASA 81mg daily, Plavix 75mg daily, Coreg 3.125mg BID, Imdur 60mg daily and Ranexa 1000mg BID.   ° °3. Ischemic Cardiomyopathy °- EF was reduced at 45% by cath in 2017. He does report dyspnea on exertion and intermittent lower extremity edema. Will order a repeat echocardiogram for reassessment of his EF and wall motion. Continue Coreg 3.125mg BID. Will hold Lisinopril and Lasix in anticipation of cath.  ° °4. HLD °- FLP pending. Remains on Pravastatin 80mg daily.  ° °5. Type 2 DM °- Hgb A1c pending. Metformin held in anticipation of cath. On SSI.  ° °6. Tobacco Use °- He continues to smoke approximately   1 ppd. Cessation advised.    For questions or updates, please contact [employer] Please consult [email or web address] for contact info under Cardiology/STEMI.  Signed, Erma Heritage, PA-C 08/19/2020, 8:56 AM Pager: [phone]   Attending note:  Patient seen and examined, case discussed with Ms. Delano Metz and agree with her above findings.  Mr. Susy Manor presents to the ER with accelerating angina, baseline history of severe cardiovascular disease status post CABG with subsequently documented graft disease and PCI.  Most recent cardiac catheterization was in 2017 at which point medical therapy was recommended.  At that point he had a patent LIMA to LAD, patent SVG to first diagonal with occluded limb to the ramus intermedius, patent ostial and distal vein graft stents.  He reports with compliant with his medications including aspirin, Plavix, Coreg, Imdur, Pravachol and Ranexa.  He has longstanding tobacco use history, unfortunately has not been able to quit despite discussions and attempts.  On examination he is in no distress, chest pain resolved.  He is  afebrile, systolic blood pressure in the 110-120 range, heart rate in the 60s in sinus rhythm by telemetry which I personally reviewed.  Lungs exhibit decreased breath sounds without active wheezing, cardiac exam with RRR no gallop.  Pertinent lab work includes potassium 3.2, but creatinine 1.14, hemoglobin 12.4, platelets 194, normal high-sensitivity troponin I levels.  Chest x-ray reports no acute process.  I personally reviewed his ECG which shows sinus rhythm with old inferior infarct pattern and nonspecific ST changes.  Patient presents with accelerating angina, high-sensitivity troponin I levels are normal at this time.  ECG with nonspecific changes.  He has advanced cardiovascular disease as discussed above, unfortunately continued tobacco abuse.  Medical therapy is well-rounded, he reports compliance.  Plan is transfer to [facility] for a follow-up cardiac catheterization to evaluate for any potential revascularization strategies, realizing that this may be limited.  Risks and benefits reviewed and he is in agreement to proceed.  Continue heparin.  Satira Sark, M.D., F.A.C.C.

## 2020-08-19 NOTE — H&P (Signed)
TRH H&P    Patient Demographics:    [name] [name], is a 54 y.o. male  MRN: [id number]  DOB - 1966/02/22  Admit Date - 08/19/2020  Referring MD/NP/PA: Christy Gentles  Outpatient Primary MD for the patient is [name], MD  Patient coming from: Home  Chief complaint- Chest pain   HPI:    [name] [name]  is a 54 y.o. male, with history of type 2 diabetes mellitus, hyperlipidemia, hypertension, CAD, ischemic cardiomyopathy, tobacco dependence and more presents to the ED with chief complaint of chest pain.  Patient reports a tightness in the center of his chest that started at 10 PM 08/18/2020.  It is exertional, intermittent and associated with shortness of breath.  Patient denies any associated palpitations, nausea, diaphoresis.  Patient did not attempt to take a nitro for the pain because he reports that last week when he had a similar episode nitro made it worse.  Patient reports that he has been having episodes of chest pain like this for the last 3 years.  He is chest pain-free at the time of my exam, but he does not know what made it go away.  He did receive aspirin and nitro in the ED -so likely that combination made it go away.   Chart review reveals the last time patient saw his cardiologist was Feb 19, 2020.  At that time he complained of more regular angina symptoms and progressive dyspnea on exertion.  At that time he had recommended a follow-up cardiac catheterization, and patient agreed, but the cardiac cath was not done.  When patient was questioned about this he said he was just putting it off. Patient has a history of CABG and subsequent stent placement.  He had a catheterization in 2017 that showed combination of graft disease and progressive native vessel disease with collaterals as well as patent stents in the ostial and distal SVG sites.  Patient was managed medically at that time.  Last echo was done  07/2013 and revealed ischemic cardiomyopathy with an ejection fraction of 35 to 40%.  In the ED Temperature 98.2, pulse 70, respiratory rate 17, blood pressure 113/65, satting at 95% on room air No leukocytosis with a white blood cell count of 7.5, hemoglobin 12.4 CHEM panel reveals hypokalemia at 3.2 Troponins normal at 9, 8 Negative flu and Covid Chest x-ray showed no active disease EKG showed sinus rhythm with a rate of 67 unchanged from prior Patient was given 324 mg chewable aspirin, started on heparin drip and started on as needed nitro ED provider spoke with cardiology fellow who recommended patient to be admitted here and for cardiology to see him in the morning    Review of systems:    In addition to the HPI above,  No Fever-chills, No Headache, No changes with Vision or hearing, No problems swallowing food or Liquids, Admits to chest pain and shortness of breath No Abdominal pain, No Nausea or Vomiting, bowel movements are regular, No Blood in stool or Urine, No dysuria, No new skin rashes or bruises, No  new joints pains-aches,  No new weakness, tingling, numbness in any extremity, No recent weight gain or loss, No polyuria, polydypsia or polyphagia, No significant Mental Stressors.  All other systems reviewed and are negative.    Past History of the following :    Past Medical History:  Diagnosis Date  . Asthma   . Bipolar disorder ([place])   . Chronic back pain   . Coronary atherosclerosis of native coronary artery    a. CABG 2003. b. NSTEMI 2010 s/p BMS to SVG-RCA c. PTCA/stent/asp thromb to SVG-RCA 01/2010, PTCA to SVG-RCA 04/2010 d. known occlusion of SVG-RCA 2013. e. NSTEMI 07/2013  s/p PTCA/DES to SVG-diagonal/intermediate/OM1.   . Essential hypertension   . GERD (gastroesophageal reflux disease)   . Gout   . Ischemic cardiomyopathy    a. EF 35-40% by cath 07/2013.  . Mixed hyperlipidemia   . Sinus bradycardia   . Type 2 diabetes mellitus ([employer])        Past Surgical History:  Procedure Laterality Date  . CARDIAC CATHETERIZATION  06/19/2014   Procedure: CORONARY STENT INTERVENTION;  Surgeon: Leonie Man, MD;  Location: [facility] CATH LAB;  Service: Cardiovascular;;  DES to seg SVG Diag/OM/[place]   . CARDIAC CATHETERIZATION N/A 11/22/2015   Procedure: Left Heart Cath and Coronary Angiography;  Surgeon: Belva Crome, MD;  Location: [facility] CV LAB;  Service: Cardiovascular;  Laterality: N/A;  . CHOLECYSTECTOMY    . CORONARY ARTERY BYPASS GRAFT     2003, LIMA to LAD; SVG to diagonal; SVG to OM, ramus, and diagonal; SVG to RCA  . LEFT HEART CATHETERIZATION WITH CORONARY ANGIOGRAM N/A 01/28/2015   Procedure: LEFT HEART CATHETERIZATION WITH CORONARY ANGIOGRAM;  Surgeon: Leonie Man, MD;  Location: [facility] CATH LAB;  Service: Cardiovascular;  Laterality: N/A;  . LEFT HEART CATHETERIZATION WITH CORONARY/GRAFT ANGIOGRAM N/A 07/14/2013   Procedure: LEFT HEART CATHETERIZATION WITH Beatrix Fetters;  Surgeon: Burnell Blanks, MD;  Location: [facility] CATH LAB;  Service: Cardiovascular;  Laterality: N/A;  . LEFT HEART CATHETERIZATION WITH CORONARY/GRAFT ANGIOGRAM N/A 12/29/2013   Procedure: LEFT HEART CATHETERIZATION WITH Beatrix Fetters;  Surgeon: Leonie Man, MD;  Location: [facility] CATH LAB;  Service: Cardiovascular;  Laterality: N/A;  . LEFT HEART CATHETERIZATION WITH CORONARY/GRAFT ANGIOGRAM N/A 06/19/2014   Procedure: LEFT HEART CATHETERIZATION WITH Beatrix Fetters;  Surgeon: Leonie Man, MD;  Location: [facility] CATH LAB;  Service: Cardiovascular;  Laterality: N/A;  . Metal plate to chin    . PERCUTANEOUS CORONARY STENT INTERVENTION (PCI-S)  07/14/2013   Procedure: PERCUTANEOUS CORONARY STENT INTERVENTION (PCI-S);  Surgeon: Burnell Blanks, MD;  Location: [facility] CATH LAB;  Service: Cardiovascular;;  . Pin placement left leg        Social History:      Social History   Tobacco Use  . Smoking status: Current Every Day  Smoker    Packs/day: 0.80    Years: 33.00    Pack years: 26.40    Types: Cigarettes    Start date: 07/01/1978  . Smokeless tobacco: Never Used  . Tobacco comment: trying to quit, 3/4 pack per day. Started the nicotine patch on 01-10-14-   Substance Use Topics  . Alcohol use: No    Alcohol/week: 0.0 standard drinks    Comment: drink a can of beer occasionally every year        Family History :     Family History  Problem Relation Age of Onset  . Stroke Other  family Hx   . Bowel Disease Mother   . Hernia Father   . Pneumonia Daughter       Home Medications:   Prior to Admission medications   Medication Sig Start Date End Date Taking? Authorizing Provider  acetaminophen (TYLENOL) 325 MG tablet Take 2 tablets (650 mg total) by mouth every 4 (four) hours as needed for headache or mild pain. 06/20/14   Erlene Quan, PA-C  albuterol (PROVENTIL HFA;VENTOLIN HFA) 108 (90 BASE) MCG/ACT inhaler Inhale 2 puffs into the lungs every 6 (six) hours as needed for wheezing or shortness of breath.    [provider]  allopurinol (ZYLOPRIM) 300 MG tablet Take 300 mg by mouth daily. 11/03/15   [provider]  aspirin EC 81 MG tablet Take 1 tablet (81 mg total) by mouth daily. 12/05/12   Donney Dice, PA-C  Blood Pressure Monitoring KIT 1 each by Does not apply route as directed. 09/29/14   Satira Sark, MD  carvedilol (COREG) 3.125 MG tablet TAKE ONE TABLET BY MOUTH 2 TIMES A DAY WITH A MEAL 12/26/19   Satira Sark, MD  clopidogrel (PLAVIX) 75 MG tablet TAKE ONE TABLET BY MOUTH DAILY 04/22/20   Satira Sark, MD  diazepam (VALIUM) 10 MG tablet Take 10 mg by mouth 2 (two) times daily.    [provider]  FLOVENT HFA 220 MCG/ACT inhaler Inhale 1 puff into the lungs daily.  10/10/17   [provider]  furosemide (LASIX) 20 MG tablet TAKE ONE TABLET BY MOUTH EVERY NIGHT AT BEDTIME 12/26/19   Satira Sark, MD  isosorbide mononitrate (IMDUR)  60 MG 24 hr tablet TAKE ONE TABLET BY MOUTH 2 TIMES A DAY 01/15/20   Satira Sark, MD  lisinopril (ZESTRIL) 2.5 MG tablet TAKE ONE TABLET BY MOUTH DAILY 12/26/19   Satira Sark, MD  Magnesium 200 MG TABS Take by mouth 2 (two) times daily.    [provider]  metFORMIN (GLUCOPHAGE) 1000 MG tablet Take 1 tablet (1,000 mg total) by mouth 2 (two) times daily with a meal. 11/23/15   Brett Canales, PA-C  nitroGLYCERIN (NITROSTAT) 0.4 MG SL tablet PLACE 1 TABLET UNDER THE TONGUE AS NEEDED FOR CHEST PAIN. MAY REPEAT EVERY 5 MINUTES UP TO 3 DOSES. IF NO RELIEF AFTER 3 DOSES, CALL 911 OR 02/12/19   Satira Sark, MD  omega-3 acid ethyl esters (LOVAZA) 1 g capsule Take 1 capsule by mouth every morning. & 2 capsules at night 08/18/19   [provider]  [facility] VERIO test strip  10/10/17   [provider]  pantoprazole (PROTONIX) 40 MG tablet Take 40 mg by mouth daily.     [provider]  pravastatin (PRAVACHOL) 80 MG tablet TAKE ONE TABLET BY MOUTH EVERY EVENING 04/22/20   Satira Sark, MD  ranolazine (RANEXA) 1000 MG SR tablet TAKE ONE TABLET BY MOUTH 2 TIMES A DAY 04/22/20   Satira Sark, MD     Allergies:    Not on File   Physical Exam:   Vitals  Blood pressure 113/65, pulse 70, temperature 98.2 F (36.8 C), resp. rate 17, height 5' 4"  (1.626 m), weight 95.3 kg, SpO2 95 %.  1.  General: Lying supine in bed, Appears older than stated age, no acute distress  2. Psychiatric: Mood and behavior normal for situation today Poor insight into medical care  3. Neurologic: Cranial nerves II through XII intact, moves all 4 extremities voluntarily,  no focal deficits on limited exam, alert and oriented x3  4. HEENMT:  Head is atraumatic, normal pupils reactive to light, neck is supple, trachea is midline, external ear is normal  5. Respiratory : Lungs are clear to auscultation bilaterally, no wheeze, rhonchi, rales  6. Cardiovascular : Heart  rate is normal, rhythm is regular, peripheral pulses palpated, no peripheral edema  7. Gastrointestinal:  Abdomen is soft, nondistended, nontender to palpation, bowel sounds present  8. Skin:  No acute lesions or rashes on limited skin exam  9.Musculoskeletal:  No acute deformity, peripheral edema, calf tenderness    Data Review:    CBC Recent Labs  Lab 08/19/20 0156  WBC 7.5  HGB 12.4*  HCT 36.3*  PLT 194  MCV 108.4*  MCH 37.0*  MCHC 34.2  RDW 14.2   ------------------------------------------------------------------------------------------------------------------  Results for orders placed or performed during the hospital encounter of 08/19/20 (from the past 48 hour(s))  Basic metabolic panel     Status: Abnormal   Collection Time: 08/19/20  1:56 AM  Result Value Ref Range   Sodium 136 135 - 145 mmol/L   Potassium 3.2 (L) 3.5 - 5.1 mmol/L   Chloride 97 (L) 98 - 111 mmol/L   CO2 29 22 - 32 mmol/L   Glucose, Bld 111 (H) 70 - 99 mg/dL    Comment: Glucose reference range applies only to samples taken after fasting for at least 8 hours.   BUN 15 6 - 20 mg/dL   Creatinine, Ser 1.14 0.61 - 1.24 mg/dL   Calcium 9.1 8.9 - 10.3 mg/dL   GFR, Estimated >60 >60 mL/min    Comment: (NOTE) Calculated using the CKD-EPI Creatinine Equation (2021)    Anion gap 10 5 - 15    Comment: Performed at [facility], [address], [place], [place] [address]  CBC     Status: Abnormal   Collection Time: 08/19/20  1:56 AM  Result Value Ref Range   WBC 7.5 4.0 - 10.5 K/uL   RBC 3.35 (L) 4.22 - 5.81 MIL/uL   Hemoglobin 12.4 (L) 13.0 - 17.0 g/dL   HCT 36.3 (L) 39 - 52 %   MCV 108.4 (H) 80.0 - 100.0 fL   MCH 37.0 (H) 26.0 - 34.0 pg   MCHC 34.2 30.0 - 36.0 g/dL   RDW 14.2 11.5 - 15.5 %   Platelets 194 150 - 400 K/uL   nRBC 0.0 0.0 - 0.2 %    Comment: Performed at [facility], [address], [place], [place] [address]  Troponin I (High Sensitivity)     Status: None   Collection Time:  08/19/20  1:56 AM  Result Value Ref Range   Troponin I (High Sensitivity) 9 <18 ng/L    Comment: (NOTE) Elevated high sensitivity troponin I (hsTnI) values and significant  changes across serial measurements may suggest ACS but many other  chronic and acute conditions are known to elevate hsTnI results.  Refer to the "Links" section for chest pain algorithms and additional  guidance. Performed at [facility], [address], [place], [place] [address]   Troponin I (High Sensitivity)     Status: None   Collection Time: 08/19/20  3:44 AM  Result Value Ref Range   Troponin I (High Sensitivity) 8 <18 ng/L    Comment: (NOTE) Elevated high sensitivity troponin I (hsTnI) values and significant  changes across serial measurements may suggest ACS but many other  chronic and acute conditions are known to elevate hsTnI  results.  Refer to the "Links" section for chest pain algorithms and additional  guidance. Performed at [facility], [address], [place], [place] [address]   Respiratory Panel by RT PCR (Flu A&B, Covid) - Nasopharyngeal Swab     Status: None   Collection Time: 08/19/20  4:20 AM   Specimen: Nasopharyngeal Swab  Result Value Ref Range   SARS Coronavirus 2 by RT PCR NEGATIVE NEGATIVE    Comment: (NOTE) SARS-CoV-2 target nucleic acids are NOT DETECTED.  The SARS-CoV-2 RNA is generally detectable in upper respiratoy specimens during the acute phase of infection. The lowest concentration of SARS-CoV-2 viral copies this assay can detect is 131 copies/mL. A negative result does not preclude SARS-Cov-2 infection and should not be used as the sole basis for treatment or other patient management decisions. A negative result may occur with  improper specimen collection/handling, submission of specimen other than nasopharyngeal swab, presence of viral mutation(s) within the areas targeted by this assay, and inadequate number of viral copies (<131 copies/mL). A negative result must  be combined with clinical observations, patient history, and epidemiological information. The expected result is Negative.  Fact Sheet for Patients:  PinkCheek.be  Fact Sheet for Healthcare Providers:  GravelBags.it  This test is no t yet approved or cleared by the [place] FDA and  has been authorized for detection and/or diagnosis of SARS-CoV-2 by FDA under an Emergency Use Authorization (EUA). This EUA will remain  in effect (meaning this test can be used) for the duration of the COVID-19 declaration under Section 564(b)(1) of the Act, 21 U.S.C. section 360bbb-3(b)(1), unless the authorization is terminated or revoked sooner.     Influenza A by PCR NEGATIVE NEGATIVE   Influenza B by PCR NEGATIVE NEGATIVE    Comment: (NOTE) The Xpert Xpress SARS-CoV-2/FLU/RSV assay is intended as an aid in  the diagnosis of influenza from Nasopharyngeal swab specimens and  should not be used as a sole basis for treatment. Nasal washings and  aspirates are unacceptable for Xpert Xpress SARS-CoV-2/FLU/RSV  testing.  Fact Sheet for Patients: PinkCheek.be  Fact Sheet for Healthcare Providers: GravelBags.it  This test is not yet approved or cleared by the [place] FDA and  has been authorized for detection and/or diagnosis of SARS-CoV-2 by  FDA under an Emergency Use Authorization (EUA). This EUA will remain  in effect (meaning this test can be used) for the duration of the  Covid-19 declaration under Section 564(b)(1) of the Act, 21  U.S.C. section 360bbb-3(b)(1), unless the authorization is  terminated or revoked. Performed at [facility], [address] [place], [place] [address]     Chemistries  Recent Labs  Lab 08/19/20 0156  NA 136  K 3.2*  CL 97*  CO2 29  GLUCOSE 111*  BUN 15  CREATININE 1.14  CALCIUM 9.1    ------------------------------------------------------------------------------------------------------------------  ------------------------------------------------------------------------------------------------------------------ GFR: Estimated Creatinine Clearance: 78 mL/min (by C-G formula based on SCr of 1.14 mg/dL). Liver Function Tests: No results for input(s): AST, ALT, ALKPHOS, BILITOT, PROT, ALBUMIN in the last 168 hours. No results for input(s): LIPASE, AMYLASE in the last 168 hours. No results for input(s): AMMONIA in the last 168 hours. Coagulation Profile: No results for input(s): INR, PROTIME in the last 168 hours. Cardiac Enzymes: No results for input(s): CKTOTAL, CKMB, CKMBINDEX, TROPONINI in the last 168 hours. BNP (last 3 results) No results for input(s): PROBNP in the last 8760 hours. HbA1C: No results for input(s): HGBA1C in the last 72 hours. CBG: No  results for input(s): GLUCAP in the last 168 hours. Lipid Profile: No results for input(s): CHOL, HDL, LDLCALC, TRIG, CHOLHDL, LDLDIRECT in the last 72 hours. Thyroid Function Tests: No results for input(s): TSH, T4TOTAL, FREET4, T3FREE, THYROIDAB in the last 72 hours. Anemia Panel: No results for input(s): VITAMINB12, FOLATE, FERRITIN, TIBC, IRON, RETICCTPCT in the last 72 hours.  --------------------------------------------------------------------------------------------------------------- Urine analysis:    Component Value Date/Time   COLORURINE YELLOW 03/18/2009 1316   APPEARANCEUR CLEAR 03/18/2009 1316   LABSPEC 1.010 03/18/2009 1316   PHURINE 5.5 03/18/2009 1316   GLUCOSEU NEGATIVE 03/18/2009 1316   HGBUR NEGATIVE 03/18/2009 1316   BILIRUBINUR NEGATIVE 03/18/2009 1316   KETONESUR NEGATIVE 03/18/2009 1316   PROTEINUR NEGATIVE 03/18/2009 1316   UROBILINOGEN 0.2 03/18/2009 1316   NITRITE NEGATIVE 03/18/2009 1316   LEUKOCYTESUR  03/18/2009 1316    NEGATIVE MICROSCOPIC NOT DONE ON URINES WITH NEGATIVE  PROTEIN, BLOOD, LEUKOCYTES, NITRITE, OR GLUCOSE <1000 mg/dL.      Imaging Results:    DG Chest Port 1 View  Result Date: 08/19/2020 CLINICAL DATA:  Mid chest pain EXAM: PORTABLE CHEST 1 VIEW COMPARISON:  01/15/2015 FINDINGS: Lungs are clear. No pneumothorax or pleural effusion. Coronary artery bypass grafting has been performed. Cardiac size within normal limits. Pulmonary vascularity is normal. No acute bone abnormality. IMPRESSION: No active disease. Electronically Signed   By: Fidela Salisbury MD   On: 08/19/2020 02:24    My personal review of EKG: Rhythm NSR, Rate 67 /min, QTc 429 unchanged from prior  Assessment & Plan:    Active Problems:   Unstable angina (HCC)   1. Unstable angina 1. Patient has risk factors: Obesity, smoker, diabetes mellitus type 2, hyperlipidemia, hypertension 2. Continue heparin drip, as needed nitro 3. Cardiology consulted-appreciate their recs 4. Monitor on telemetry 5. EKG for recurrent chest pain 6. Continue to medications as below 2. Coronary artery disease 1. Patient is medically optimized on daily aspirin, Coreg, Imdur, Ranexa, lisinopril, statin, Plavix 2. Continue these home medication above 3. Hypokalemia 1. Replace and recheck 4. Diabetes mellitus type 1. Sliding scale coverage 2. When diet is started- carb modified diet 5. Tobacco dependence 1. Smokes a pack to pack and a half a day, and declines nicotine patch at this time    DVT Prophylaxis-   Heparin- SCDs   AM Labs Ordered, also please review Full Orders  Family Communication: No family at bedside Code Status: Full  Admission status: Observation The patient's presenting symptoms include chest pain The chronic co-morbidities include tobacco dependence, diabetes mellitus type 2, obesity, hypertension, hyperlipidemia        Time spent in minutes : 68   Meera Vasco B Zierle-Ghosh DO

## 2020-08-19 NOTE — Progress Notes (Signed)
  PROGRESS NOTE  [name] [name] UDJ:[id number] DOB: May 25, 1966 DOA: 08/19/2020 PCP: Toma Deiters, MD  Brief History   54yom PMH DM type 2, CAD, CABG, stent, ischemic cardiomyopathy, acitve smoker presented w/ CP concerning for [place]. Cardiology rec's transfer to [facility] under their service for [facility].   A & P  Unstable angina inpatient with known ischemic cardiomyopathy, previous CABG and PTCA with stent --Troponins negative.  Chest x-ray and EKG nonacute.  Seen by cardiology with plan to transfer to [facility] under their service.  Diabetes mellitus type 2 --Appears stable, recommend continuing sliding scale insulin and holding Metformin while hospitalized.  Tobacco dependence --Recommend cessation  Disposition Plan:  Discussion: Transfer to [facility] under cardiology.  TRH will sign off.  Please call with questions.  Brendia Sacks, MD  [facility] Direct contact: see [email or web address] (further directions at bottom of note if needed) 7PM-7AM contact night coverage as at bottom of note 08/19/2020, 9:16 AM  LOS: 0 days   Interval History/Subjective  CC: f/u chest pain  Feels okay now.  No chest pain or shortness of breath.  Objective   Vitals:  Vitals:   08/19/20 0808 08/19/20 0900  BP: 119/78 114/90  Pulse: 84 67  Resp: (!) 32 (!) 21  Temp:    SpO2: 92% 96%    Exam:  Appears calm and comfortable.  I have personally reviewed the following:   Today's Data  . CBG stable.  Anion gap within normal limits.  Scheduled Meds: . aspirin  81 mg Oral Pre-Cath  . aspirin EC  81 mg Oral Daily  . budesonide (PULMICORT) nebulizer solution  0.5 mg Nebulization BID  . carvedilol  3.125 mg Oral BID WC  . clopidogrel  75 mg Oral Daily  . insulin aspart  0-15 Units Subcutaneous TID WC  . insulin aspart  0-5 Units Subcutaneous QHS  . isosorbide mononitrate  60 mg Oral Daily  . pantoprazole  40 mg Oral Daily  . pravastatin  80 mg Oral QPM  . ranolazine  1,000 mg Oral BID  .  sodium chloride flush  3 mL Intravenous Q12H   Continuous Infusions: . heparin 1,100 Units/hr (08/19/20 0458)  . potassium chloride Stopped (08/19/20 0834)    Active Problems:   Unstable angina (HCC)   LOS: 0 days   How to contact the [facility] Attending or Consulting provider 7A - 7P or covering provider during after hours 7P -7A, for this patient?  1. Check the care team in [facility] and look for a) attending/consulting TRH provider listed and b) the [facility] team listed 2. Log into [email or web address] and use [facility] [place]'s universal password to access. If you do not have the password, please contact the hospital operator. 3. Locate the [facility] provider you are looking for under [facility] and page to a number that you can be directly reached. 4. If you still have difficulty reaching the provider, please page the [facility] (Director on Call) for the Hospitalists listed on amion for assistance.

## 2020-08-19 NOTE — Interval H&P Note (Signed)
History and Physical Interval Note:  08/19/2020 3:49 PM  [name] [name]  has presented today for surgery, with the diagnosis of unstable angina.  The various methods of treatment have been discussed with the patient and family. After consideration of risks, benefits and other options for treatment, the patient has consented to  Procedure(s): LEFT HEART CATH AND CORS/GRAFTS ANGIOGRAPHY (N/A) as a surgical intervention.  The patient's history has been reviewed, patient examined, no change in status, stable for surgery.  I have reviewed the patient's chart and labs.  Questions were answered to the patient's satisfaction.    Cath Lab Visit (complete for each Cath Lab visit)  Clinical Evaluation Leading to the Procedure:   ACS: Yes.    Non-ACS:  N/A  Icarus Partch

## 2020-08-20 ENCOUNTER — Other Ambulatory Visit: Payer: Self-pay | Admitting: Medical

## 2020-08-20 ENCOUNTER — Encounter (HOSPITAL_COMMUNITY): Payer: Self-pay | Admitting: Internal Medicine

## 2020-08-20 DIAGNOSIS — I200 Unstable angina: Secondary | ICD-10-CM | POA: Diagnosis not present

## 2020-08-20 DIAGNOSIS — N289 Disorder of kidney and ureter, unspecified: Secondary | ICD-10-CM

## 2020-08-20 LAB — COMPREHENSIVE METABOLIC PANEL
ALT: 10 U/L (ref 0–44)
AST: 15 U/L (ref 15–41)
Albumin: 3.5 g/dL (ref 3.5–5.0)
Alkaline Phosphatase: 41 U/L (ref 38–126)
Anion gap: 9 (ref 5–15)
BUN: 10 mg/dL (ref 6–20)
CO2: 29 mmol/L (ref 22–32)
Calcium: 9.2 mg/dL (ref 8.9–10.3)
Chloride: 100 mmol/L (ref 98–111)
Creatinine, Ser: 1.29 mg/dL — ABNORMAL HIGH (ref 0.61–1.24)
GFR, Estimated: >60 mL/min (ref >60)
Glucose, Bld: 107 mg/dL — ABNORMAL HIGH (ref 70–99)
Potassium: 4.5 mmol/L (ref 3.5–5.1)
Sodium: 138 mmol/L (ref 135–145)
Total Bilirubin: 0.8 mg/dL (ref 0.3–1.2)
Total Protein: 6.2 g/dL — ABNORMAL LOW (ref 6.5–8.1)

## 2020-08-20 LAB — LIPID PANEL
Cholesterol: 108 mg/dL (ref 0–200)
HDL: 46 mg/dL (ref >40)
LDL Cholesterol: 44 mg/dL (ref 0–99)
Total CHOL/HDL Ratio: 2.3 RATIO
Triglycerides: 89 mg/dL (ref <150)
VLDL: 18 mg/dL (ref 0–40)

## 2020-08-20 LAB — GLUCOSE, CAPILLARY: Glucose-Capillary: 104 mg/dL — ABNORMAL HIGH (ref 70–99)

## 2020-08-20 MED ORDER — FUROSEMIDE 40 MG PO TABS: 40.0000 mg | ORAL_TABLET | Freq: Every day | ORAL | 1 refills | Status: DC

## 2020-08-20 MED FILL — Verapamil HCl IV Soln 2.5 MG/ML: INTRAVENOUS | Qty: 2 | Status: AC

## 2020-08-20 MED FILL — Nitroglycerin IV Soln 100 MCG/ML in D5W: INTRA_ARTERIAL | Qty: 10 | Status: AC

## 2020-08-20 NOTE — Discharge Instructions (Signed)
Information about your medication: Plavix (anti-platelet agent)  Generic Name (Brand): clopidogrel (Plavix), once daily medication  PURPOSE: You are taking this medication along with aspirin to lower your chance of having a heart attack, stroke, or blood clots in your heart stent. These can be fatal. Plavix and aspirin help prevent platelets from sticking together and forming a clot that can block an artery or your stent.   Common SIDE EFFECTS you may experience include: bruising or bleeding more easily, shortness of breath  Do not stop taking PLAVIX without talking to the doctor who prescribes it for you. People who are treated with a stent and stop taking Plavix too soon, have a higher risk of getting a blood clot in the stent, having a heart attack, or dying. If you stop Plavix because of bleeding, or for other reasons, your risk of a heart attack or stroke may increase.   Avoid taking NSAID agents or anti-inflammatory medications such as ibuprofen, naproxen given increased bleed risk with plavix - can use acetaminophen (Tylenol) if needed for pain.  Avoid taking over the counter stomach medications omeprazole (Prilosec) or esomeprazole (Nexium) since these do interact and make plavix less effective - ask your pharmacist or doctor for alterative agents if needed for heartburn or GERD.   Tell all of your doctors and dentists that you are taking Plavix. They should talk to the doctor who prescribed Plavix for you before you have any surgery or invasive procedure.   Contact your health care provider if you experience: severe or uncontrollable bleeding, pink/red/brown urine, vomiting blood or vomit that looks like "coffee grounds", red or black stools (looks like tar), coughing up blood or blood clots ----------------------------------------------------------------------------------------------------------------------   **CAN RESTART METFORMIN ON 11/14 (48 HR AFTER CARDIAC CATH)**

## 2020-08-20 NOTE — Progress Notes (Signed)
Discharge instructions including but not limited to f/u appmts, daily wt, diet, meds & site care given to pt & family. All questions answered & teaching verified via teachback. All belongings gathered & sent w/ pt. Sanda Linger, RN

## 2020-08-20 NOTE — Discharge Summary (Signed)
Discharge Summary    Patient ID: [name] [name] MRN: [id number]; DOB: 07/22/1966  Admit date: 08/19/2020 Discharge date: 08/20/2020  Primary Care Provider: [name], MD  Primary Cardiologist: [name], MD  Primary Electrophysiologist:  None   Discharge Diagnoses    Principal Problem:   Unstable angina [facility]) Active Problems:   Mixed hyperlipidemia   TOBACCO USER   Essential hypertension, benign   Cardiomyopathy, ischemic   Exertional angina ([place])   Coronary artery disease involving coronary bypass graft of native heart with unstable angina pectoris ([place])   DM type 2 (diabetes mellitus, type 2) ([place])   Diagnostic Studies/Procedures    Cardiac catheterization 08/19/20 Conclusions: 1. Severe native coronary artery disease, including 60% LMCA disease, chronic total occlusions of ostial LAD and mid RCA, and 80% proximal ramus intermedius stenosis. 2. Widely patent LIMA-LAD. 3. Patent SVG-D1 with mild to moderate in-stent restenosis in the proximal and distal graft stents. 4. Chronically occluded SVG-RCA. 5. Low normal left ventricular systolic function with mid anterior hypokinesis. 6. Mildly to moderately elevated left ventricular filling pressure.  Recommendations: 1. Continue aggressive medical therapy.  Appearance of native coronary arteries and bypasses is not significantly different compared with prior catheterization in 11/2015. 2. Escalate diuresis, as worsening HFpEF may be contributing to the patient's symptoms. 3. Aggressive secondary prevention of coronary artery disease.  Nelva Bush, MD [facility] Antiplatelet/Anticoag Recommend dual antiplatelet therapy with Aspirin 25m daily and Clopidogrel 776mdaily.  Discharge Date In the absence of any other complications or medical issues, we expect the patient to be ready for discharge on 08/20/2020.   Coronary Diagrams  Diagnostic Dominance: Right     Echo 08/19/20 1. Left ventricular  ejection fraction, by estimation, is 55 to 60%. The  left ventricle has normal function. The left ventricle demonstrates  regional wall motion abnormalities (see scoring diagram/findings for  description). There is mild left ventricular  hypertrophy. Left ventricular diastolic parameters were normal.  2. Right ventricular systolic function is normal. The right ventricular  size is normal. There is normal pulmonary artery systolic pressure. The  estimated right ventricular systolic pressure is 3037.1mHg.  3. The mitral valve is grossly normal. Trivial mitral valve  regurgitation.  4. The aortic valve is tricuspid. Aortic valve regurgitation is not  visualized. Mild aortic valve sclerosis is present, with no evidence of  aortic valve stenosis.  5. The inferior vena cava is dilated in size with >50% respiratory  variability, suggesting right atrial pressure of 8 mmHg.  _____________   History of Present Illness     [name] BAUMGARDNERs a [age].o. male with pmh of CAD (s/p CABG in 2003, BMS to SVWallacetonn 2010, PTCA and stenting in SVG-RCA in 01/2010 and PTCA alone to same vessel in 04/2010), CTO SVG-RCA and 2013, DES to SVG-D1-OM1 in 2014, cath and 11/2015 showing patent LIMA-LAD and SVG-D1-RI with occlusion of limb to RI with stents in the ostium and distal SVG remaining patent and medical management recommended), ischemic cardiomyopathy (EF 45% by cath in 11/2015), hypertension, hyperlipidemia, type 2 diabetes and tobacco use who was seen for chest pain.  Patient was seen in the office 02/26/2020 reporting more frequent episodes of angina using nitroglycerin.[facility] for progressive chest pain.  He was having pain walking from room to room in his home.  Reported associated dyspnea.  Pain resolving with nitroglycerin.  Hospital Course     Consultants: None  In the ED initial troponin  was nine with repeat of a.  Creatinine 1.14.  Covid negative.  EKG showed normal  sinus rhythm with inferior infarct pattern.  Patient was started on IV heparin.  He was admitted home lisinopril and Lasix were held in anticipation of cath.  All other home medications were restarted.  Cardiac catheterization showed severe native coronary artery disease, 60% LMCA disease, chronic total occlusions of ostial LAD and mid RCA, 80% proximal ramus intermedius stenosis, widely patent LIMA to LAD, patent SVG to D1 with mild to moderate in-stent restenosis of the proximal distal graft stents, chronically occluded SVG to RCA, normal LV function with mid anterior hypokinesis, mildly to moderate elevated LV filling pressures.  It appeared it was not significantly different compared to cath in 2017.  Postprocedure recommendations for aggressive medical therapy, stable escalation of diuresis and secondary prevention of CAD. Recommended dual antiplatelet therapy with aspirin and Plavix (patient was already on).  Lasix was increase to 40 mg daily. The patient was kept overnight.  Echo showed LVEF 55 to 60%, no wall motion abnormalities, mild LVH, RV systolic pressure 30 mmHg, trivial MR, mild aortic valve sclerosis.  Follow-up labs showed creatinine 1.29.  Lipid panel showed LDL of 47, HDL 25, triglycerides 204 total 113. Will send patient home on lasix 40 mg daily. Cath sites, right wrist and right groin, remained stable. Restart all other home medications on discharge.   The patient was evaluated by Dr. Audie Box 08/20/20 and felt to be stable for discharge.    General:  No chills, fever, night sweats or weight changes.  Cardiovascular:  No orthopnea, palpitations, paroxysmal nocturnal dyspnea.  Dermatological: No rash, lesions/masses Respiratory: No cough, dyspnea Urologic: No hematuria, dysuria Abdominal:   No nausea, vomiting, diarrhea, bright red blood per rectum, melena, or hematemesis Neurologic:  No visual changes, wkns, changes in mental status. All other systems reviewed and are otherwise  negative except as noted above.  Did the patient have an acute coronary syndrome (MI, NSTEMI, STEMI, etc) this admission?:  No                               Did the patient have a percutaneous coronary intervention (stent / angioplasty)?:  No.   _____________  Discharge Vitals Blood pressure 140/75, pulse 68, temperature 98.9 F (37.2 C), temperature source Oral, resp. rate 14, height 5' 4"  (1.626 m), weight 94.9 kg, SpO2 99 %.  Filed Weights   08/19/20 0140 08/20/20 0446  Weight: 95.3 kg 94.9 kg    Labs & Radiologic Studies    CBC Recent Labs    08/19/20 0156  WBC 7.5  HGB 12.4*  HCT 36.3*  MCV 108.4*  PLT 413   Basic Metabolic Panel Recent Labs    08/19/20 0156 08/20/20 0336  NA 136 138  K 3.2* 4.5  CL 97* 100  CO2 29 29  GLUCOSE 111* 107*  BUN 15 10  CREATININE 1.14 1.29*  CALCIUM 9.1 9.2   Liver Function Tests Recent Labs    08/20/20 0336  AST 15  ALT 10  ALKPHOS 41  BILITOT 0.8  PROT 6.2*  ALBUMIN 3.5   No results for input(s): LIPASE, AMYLASE in the last 72 hours. High Sensitivity Troponin:   Recent Labs  Lab 08/19/20 0156 08/19/20 0344  TROPONINIHS 9 8    BNP Invalid input(s): POCBNP D-Dimer No results for input(s): DDIMER in the last 72 hours. Hemoglobin A1C Recent Labs  08/19/20 0623  HGBA1C 5.3   Fasting Lipid Panel Recent Labs    08/20/20 0336  CHOL 108  HDL 46  LDLCALC 44  TRIG 89  CHOLHDL 2.3   Thyroid Function Tests No results for input(s): TSH, T4TOTAL, T3FREE, THYROIDAB in the last 72 hours.  Invalid input(s): FREET3 _____________  CARDIAC CATHETERIZATION  Result Date: 08/19/2020 Conclusions: 1. Severe native coronary artery disease, including 60% LMCA disease, chronic total occlusions of ostial LAD and mid RCA, and 80% proximal ramus intermedius stenosis. 2. Widely patent LIMA-LAD. 3. Patent SVG-D1 with mild to moderate in-stent restenosis in the proximal and distal graft stents. 4. Chronically occluded SVG-RCA.  5. Low normal left ventricular systolic function with mid anterior hypokinesis. 6. Mildly to moderately elevated left ventricular filling pressure. Recommendations: 1. Continue aggressive medical therapy.  Appearance of native coronary arteries and bypasses is not significantly different compared with prior catheterization in 11/2015. 2. Escalate diuresis, as worsening HFpEF may be contributing to the patient's symptoms. 3. Aggressive secondary prevention of coronary artery disease. Nelva Bush, MD [facility]   DG Chest Port 1 View  Result Date: 08/19/2020 CLINICAL DATA:  Mid chest pain EXAM: PORTABLE CHEST 1 VIEW COMPARISON:  01/15/2015 FINDINGS: Lungs are clear. No pneumothorax or pleural effusion. Coronary artery bypass grafting has been performed. Cardiac size within normal limits. Pulmonary vascularity is normal. No acute bone abnormality. IMPRESSION: No active disease. Electronically Signed   By: Fidela Salisbury MD   On: 08/19/2020 02:24   ECHOCARDIOGRAM COMPLETE  Result Date: 08/19/2020    ECHOCARDIOGRAM REPORT   Patient Name:   [name] [name] Date of Exam: 08/19/2020 Medical Rec #:  [id number]        Height:       64.0 in Accession #:    [id number]       Weight:       210.0 lb Date of Birth:  [birth year]         BSA:          1.997 m Patient Age:    38 years         BP:           114/90 mmHg Patient Gender: M                HR:           67 bpm. Exam [facility] Procedure: 2D Echo Indications:    Cardiomyopathy-Ischemic 414.8 / I25.5  History:        Patient has prior history of Echocardiogram examinations, most                 recent 12/30/2013. CAD, Signs/Symptoms:Chest Pain; Risk                 Factors:Current Smoker, Hypertension, Dyslipidemia and Diabetes.                 Polysubstance abuse , Bradycardia.  Sonographer:    Leavy Cella RDCS (AE) Referring Phys: [id number] [place]  1. Left ventricular ejection fraction, by estimation, is 55 to 60%. The left  ventricle has normal function. The left ventricle demonstrates regional wall motion abnormalities (see scoring diagram/findings for description). There is mild left ventricular  hypertrophy. Left ventricular diastolic parameters were normal.  2. Right ventricular systolic function is normal. The right ventricular size is normal. There is normal pulmonary artery systolic pressure. The estimated right ventricular systolic pressure is 73.2 mmHg.  3. The mitral valve  is grossly normal. Trivial mitral valve regurgitation.  4. The aortic valve is tricuspid. Aortic valve regurgitation is not visualized. Mild aortic valve sclerosis is present, with no evidence of aortic valve stenosis.  5. The inferior vena cava is dilated in size with >50% respiratory variability, suggesting right atrial pressure of 8 mmHg. FINDINGS  Left Ventricle: Left ventricular ejection fraction, by estimation, is 55 to 60%. The left ventricle has normal function. The left ventricle demonstrates regional wall motion abnormalities. The left ventricular internal cavity size was normal in size. There is mild left ventricular hypertrophy. Left ventricular diastolic parameters were normal.  LV Wall Scoring: The basal inferior segment is hypokinetic. The entire anterior wall, entire lateral wall, entire septum, entire apex, and mid and distal inferior wall are normal. Right Ventricle: The right ventricular size is normal. No increase in right ventricular wall thickness. Right ventricular systolic function is normal. There is normal pulmonary artery systolic pressure. The tricuspid regurgitant velocity is 2.38 m/s, and  with an assumed right atrial pressure of 8 mmHg, the estimated right ventricular systolic pressure is 25.8 mmHg. Left Atrium: Left atrial size was normal in size. Right Atrium: Right atrial size was normal in size. Pericardium: There is no evidence of pericardial effusion. Mitral Valve: The mitral valve is grossly normal. Trivial mitral valve  regurgitation. Tricuspid Valve: The tricuspid valve is grossly normal. Tricuspid valve regurgitation is trivial. Aortic Valve: The aortic valve is tricuspid. There is mild aortic valve annular calcification. Aortic valve regurgitation is not visualized. Mild aortic valve sclerosis is present, with no evidence of aortic valve stenosis. Pulmonic Valve: The pulmonic valve was grossly normal. Pulmonic valve regurgitation is trivial. Aorta: The aortic root is normal in size and structure. Venous: The inferior vena cava is dilated in size with greater than 50% respiratory variability, suggesting right atrial pressure of 8 mmHg. IAS/Shunts: No atrial level shunt detected by color flow Doppler.  LEFT VENTRICLE PLAX 2D LVIDd:         4.31 cm  Diastology LVIDs:         3.32 cm  LV e' medial:    7.62 cm/s LV PW:         1.19 cm  LV E/e' medial:  12.0 LV IVS:        1.03 cm  LV e' lateral:   11.30 cm/s LVOT diam:     2.00 cm  LV E/e' lateral: 8.1 LVOT Area:     3.14 cm  RIGHT VENTRICLE RV S prime:     8.27 cm/s TAPSE (M-mode): 1.8 cm LEFT ATRIUM             Index       RIGHT ATRIUM           Index LA diam:        3.80 cm 1.90 cm/m  RA Area:     19.60 cm LA Vol (A2C):   56.6 ml 28.34 ml/m RA Volume:   51.90 ml  25.98 ml/m LA Vol (A4C):   42.1 ml 21.08 ml/m LA Biplane Vol: 49.6 ml 24.83 ml/m   AORTA Ao Root diam: 2.90 cm MITRAL VALVE               TRICUSPID VALVE MV Area (PHT): 3.89 cm    TR Peak grad:   22.7 mmHg MV Decel Time: 195 msec    TR Vmax:        238.00 cm/s MV E velocity: 91.70 cm/s MV A velocity: 73.30 cm/s  SHUNTS MV E/A ratio:  1.25        Systemic Diam: 2.00 cm [name] MD Electronically signed by [name] MD Signature Date/Time: 08/19/2020/11:52:45 AM    Final    Disposition   Pt is being discharged home today in good condition.  Follow-up Plans & Appointments     Follow-up Information    Verta Ellen., NP Follow up on 09/10/2020.   Specialty: Cardiology Why: @  10:30AM Contact information: [place] [address] [place] Follow up.   Specialty: Cardiology Contact information: [place] [phone]             Discharge Instructions    ([place]) Call MD:  Anytime you have any of the following symptoms: 1) 3 pound weight gain in 24 hours or 5 pounds in 1 week 2) shortness of breath, with or without a dry hacking cough 3) swelling in the hands, feet or stomach 4) if you have to sleep on extra pillows at night in order to breathe.   Complete by: As directed    Diet - low sodium heart healthy   Complete by: As directed    Increase activity slowly   Complete by: As directed       Discharge Medications   Allergies as of 08/20/2020   Not on File     Medication List    TAKE these medications   acetaminophen 325 MG tablet Commonly known as: TYLENOL Take 2 tablets (650 mg total) by mouth every 4 (four) hours as needed for headache or mild pain.   albuterol 108 (90 Base) MCG/ACT inhaler Commonly known as: VENTOLIN HFA Inhale 2 puffs into the lungs every 6 (six) hours as needed for wheezing or shortness of breath.   allopurinol 300 MG tablet Commonly known as: ZYLOPRIM Take 300 mg by mouth daily.   aspirin EC 81 MG tablet Take 1 tablet (81 mg total) by mouth daily.   Blood Pressure Monitoring Kit 1 each by Does not apply route as directed.   carvedilol 3.125 MG tablet Commonly known as: COREG TAKE ONE TABLET BY MOUTH 2 TIMES A DAY WITH A MEAL What changed: See the new instructions.   clopidogrel 75 MG tablet Commonly known as: PLAVIX TAKE ONE TABLET BY MOUTH DAILY   diazepam 10 MG tablet Commonly known as: VALIUM Take 10 mg by mouth 2 (two) times daily.   Flovent HFA 220 MCG/ACT inhaler Generic drug: fluticasone Inhale 1 puff into the lungs daily.   furosemide 40 MG  tablet Commonly known as: LASIX Take 1 tablet (40 mg total) by mouth daily. Start taking on: August 21, 2020 What changed:   medication strength  how much to take  when to take this   isosorbide mononitrate 60 MG 24 hr tablet Commonly known as: IMDUR TAKE ONE TABLET BY MOUTH 2 TIMES A DAY What changed: See the new instructions.   lisinopril 2.5 MG tablet Commonly known as: ZESTRIL TAKE ONE TABLET BY MOUTH DAILY   metFORMIN 1000 MG tablet Commonly known as: GLUCOPHAGE Take 1 tablet (1,000 mg total) by mouth 2 (two) times daily with a meal. What changed: when to take this   nitroGLYCERIN 0.4 MG SL tablet Commonly known as: NITROSTAT PLACE 1 TABLET UNDER THE TONGUE AS NEEDED FOR CHEST PAIN. MAY REPEAT EVERY 5 MINUTES UP TO  3 DOSES. IF NO RELIEF AFTER 3 DOSES, CALL 911 OR What changed: See the new instructions.   omega-3 acid ethyl esters 1 g capsule Commonly known as: LOVAZA Take 1 capsule by mouth every morning. & 2 capsules at night   OneTouch Verio test strip Generic drug: glucose blood   pantoprazole 40 MG tablet Commonly known as: PROTONIX Take 40 mg by mouth daily.   pravastatin 80 MG tablet Commonly known as: PRAVACHOL TAKE ONE TABLET BY MOUTH EVERY EVENING What changed: when to take this   ranolazine 1000 MG SR tablet Commonly known as: RANEXA TAKE ONE TABLET BY MOUTH 2 TIMES A DAY What changed: See the new instructions.          Outstanding Labs/Studies   BMET 1 week  Duration of Discharge Encounter   Greater than 30 minutes including physician time.  Signed, Shiva Karis Ninfa Meeker, PA-C 08/20/2020, 10:23 AM

## 2020-09-09 NOTE — Progress Notes (Signed)
Cardiology Office Note  Date: 09/10/2020   ID: [name] [name], DOB [birth year], MRN [id number]  PCP:  [name], MD  Cardiologist:  [name], MD Electrophysiologist:  None   Chief Complaint: Accelerating angina, CAD, HLD, ischemic cardiomyopathy, tobacco abuse  History of Present Illness: [name] [name] is a 54 y.o. male with a history of accelerating angina, CAD, HLD, ischemic cardiomyopathy, tobacco abuse.  Last encounter with Dr Domenic Polite on 02/19/2020. He had been experiencing more anginal symptoms and progressive DOE with more frequent use of NTG. He was continuing aspirin, Plavix, Coreg, Imdur, Lisinopril, Pravachol, and Ranexa.  Previous cardiac catheterization 2017 : Combination of graft disease and progressive native vessel disease with collateral circulation. Had patent stents in ostial and distal SVG sites. Managed medically. Follow up cardiac catheterization was ordered. He had not been able to stop smoking.  Cardiac catheterization on 08/19/2020 : LMCA 60%, CTO's of ostial LAD and RCA. prox RI 80%, Widely patent LIMA-LAD. Patent SVG-D1 mild to mod ISR in prox. and distal graft stents. No significant difference compared to previous cath 2017. Recommended escalating diuretic therapy as worsening HFpEF may be contributing to symptoms.   He is here today for follow-up status post recent cardiac catheterization. He denies any anginal symptoms or exertional symptoms. States he started walking some with his wife. States he has an occasional "twinge" in his chest which is short-lived. He denies any radiation to neck, arm, back, jaw denies any associated dizziness, diaphoresis, nausea, vomiting. He states he has less swelling in his legs since increasing the dose of Lasix. He continues to smoke but states he is smoking less. States he would like to quit but finding it very hard to stop.   Past Medical History:  Diagnosis Date  . Asthma   . Bipolar disorder ([place])   .  Chronic back pain   . Coronary atherosclerosis of native coronary artery    a. CABG 2003. b. NSTEMI 2010 s/p BMS to SVG-RCA c. PTCA/stent/asp thromb to SVG-RCA 01/2010, PTCA to SVG-RCA 04/2010 d. known occlusion of SVG-RCA 2013. e. NSTEMI 07/2013  s/p PTCA/DES to SVG-diagonal/intermediate/OM1.   . Essential hypertension   . GERD (gastroesophageal reflux disease)   . Gout   . Ischemic cardiomyopathy    a. EF 35-40% by cath 07/2013.  . Mixed hyperlipidemia   . Sinus bradycardia   . Type 2 diabetes mellitus ([place])     Past Surgical History:  Procedure Laterality Date  . CARDIAC CATHETERIZATION  06/19/2014   Procedure: CORONARY STENT INTERVENTION;  Surgeon: Leonie Man, MD;  Location: [facility] CATH LAB;  Service: Cardiovascular;;  DES to seg SVG Diag/OM/[place]   . CARDIAC CATHETERIZATION N/A 11/22/2015   Procedure: Left Heart Cath and Coronary Angiography;  Surgeon: Belva Crome, MD;  Location: [place] CV LAB;  Service: Cardiovascular;  Laterality: N/A;  . CARDIAC CATHETERIZATION  08/19/2020  . CHOLECYSTECTOMY    . CORONARY ARTERY BYPASS GRAFT     2003, LIMA to LAD; SVG to diagonal; SVG to OM, ramus, and diagonal; SVG to RCA  . LEFT HEART CATH AND CORS/GRAFTS ANGIOGRAPHY N/A 08/19/2020   Procedure: LEFT HEART CATH AND CORS/GRAFTS ANGIOGRAPHY;  Surgeon: Nelva Bush, MD;  Location: [place] CV LAB;  Service: Cardiovascular;  Laterality: N/A;  . LEFT HEART CATHETERIZATION WITH CORONARY ANGIOGRAM N/A 01/28/2015   Procedure: LEFT HEART CATHETERIZATION WITH CORONARY ANGIOGRAM;  Surgeon: Leonie Man, MD;  Location: [facility] CATH LAB;  Service: Cardiovascular;  Laterality: N/A;  .  LEFT HEART CATHETERIZATION WITH CORONARY/GRAFT ANGIOGRAM N/A 07/14/2013   Procedure: LEFT HEART CATHETERIZATION WITH Beatrix Fetters;  Surgeon: Burnell Blanks, MD;  Location: [facility] CATH LAB;  Service: Cardiovascular;  Laterality: N/A;  . LEFT HEART CATHETERIZATION WITH CORONARY/GRAFT ANGIOGRAM N/A 12/29/2013    Procedure: LEFT HEART CATHETERIZATION WITH Beatrix Fetters;  Surgeon: Leonie Man, MD;  Location: [facility] CATH LAB;  Service: Cardiovascular;  Laterality: N/A;  . LEFT HEART CATHETERIZATION WITH CORONARY/GRAFT ANGIOGRAM N/A 06/19/2014   Procedure: LEFT HEART CATHETERIZATION WITH Beatrix Fetters;  Surgeon: Leonie Man, MD;  Location: [facility] CATH LAB;  Service: Cardiovascular;  Laterality: N/A;  . Metal plate to chin    . PERCUTANEOUS CORONARY STENT INTERVENTION (PCI-S)  07/14/2013   Procedure: PERCUTANEOUS CORONARY STENT INTERVENTION (PCI-S);  Surgeon: Burnell Blanks, MD;  Location: [facility] CATH LAB;  Service: Cardiovascular;;  . Pin placement left leg      Current Outpatient Medications  Medication Sig Dispense Refill  . acetaminophen (TYLENOL) 325 MG tablet Take 2 tablets (650 mg total) by mouth every 4 (four) hours as needed for headache or mild pain.    Marland Kitchen albuterol (PROVENTIL HFA;VENTOLIN HFA) 108 (90 BASE) MCG/ACT inhaler Inhale 2 puffs into the lungs every 6 (six) hours as needed for wheezing or shortness of breath.     . allopurinol (ZYLOPRIM) 300 MG tablet Take 300 mg by mouth daily.  0  . aspirin EC 81 MG tablet Take 1 tablet (81 mg total) by mouth daily.    . Blood Pressure Monitoring KIT 1 each by Does not apply route as directed. 1 kit 0  . carvedilol (COREG) 3.125 MG tablet TAKE ONE TABLET BY MOUTH 2 TIMES A DAY WITH A MEAL (Patient taking differently: Take 3.125 mg by mouth 2 (two) times daily with a meal. ) 180 tablet 2  . clopidogrel (PLAVIX) 75 MG tablet TAKE ONE TABLET BY MOUTH DAILY (Patient taking differently: Take 75 mg by mouth daily. ) 90 tablet 3  . diazepam (VALIUM) 10 MG tablet Take 10 mg by mouth 2 (two) times daily.    Marland Kitchen FLOVENT HFA 220 MCG/ACT inhaler Inhale 1 puff into the lungs daily.     . furosemide (LASIX) 40 MG tablet Take 1 tablet (40 mg total) by mouth daily. 30 tablet 1  . isosorbide mononitrate (IMDUR) 60 MG 24 hr tablet TAKE ONE TABLET  BY MOUTH 2 TIMES A DAY (Patient taking differently: Take 60 mg by mouth in the morning and at bedtime. ) 180 tablet 2  . lisinopril (ZESTRIL) 2.5 MG tablet TAKE ONE TABLET BY MOUTH DAILY (Patient taking differently: Take 2.5 mg by mouth daily. ) 90 tablet 2  . metFORMIN (GLUCOPHAGE) 1000 MG tablet Take 1,000 mg by mouth daily with breakfast.    . nitroGLYCERIN (NITROSTAT) 0.4 MG SL tablet PLACE 1 TABLET UNDER THE TONGUE AS NEEDED FOR CHEST PAIN. MAY REPEAT EVERY 5 MINUTES UP TO 3 DOSES. IF NO RELIEF AFTER 3 DOSES, CALL 911 OR (Patient taking differently: Place 0.4 mg under the tongue every 5 (five) minutes as needed for chest pain. ) 25 tablet 0  . omega-3 acid ethyl esters (LOVAZA) 1 g capsule Take 1 capsule by mouth every morning. & 2 capsules at night    . ONETOUCH VERIO test strip     . pantoprazole (PROTONIX) 40 MG tablet Take 40 mg by mouth daily.     . pravastatin (PRAVACHOL) 80 MG tablet TAKE ONE TABLET BY MOUTH EVERY EVENING (Patient taking differently:  Take 80 mg by mouth daily. ) 90 tablet 3  . ranolazine (RANEXA) 1000 MG SR tablet TAKE ONE TABLET BY MOUTH 2 TIMES A DAY (Patient taking differently: Take 1,000 mg by mouth 2 (two) times daily. ) 180 tablet 3   No current facility-administered medications for this visit.   Allergies:  Patient has no known allergies.   Social History: The patient  reports that he has been smoking cigarettes. He started smoking about 42 years ago. He has a 26.40 pack-year smoking history. He has never used smokeless tobacco. He reports current drug use. Frequency: 2.00 times per week. Drug: Marijuana. He reports that he does not drink alcohol.   Family History: The patient's family history includes Bowel Disease in his mother; Hernia in his father; Pneumonia in his daughter; Stroke in an other family member.   ROS:  Please see the history of present illness. Otherwise, complete review of systems is positive for none.  All other systems are reviewed and  negative.   Physical Exam: VS:  BP 118/82   Pulse 66   Ht 5' 4"  (1.626 m)   Wt 210 lb (95.3 kg)   BMI 36.05 kg/m , BMI Body mass index is 36.05 kg/m.  Wt Readings from Last 3 Encounters:  09/10/20 210 lb (95.3 kg)  08/20/20 209 lb 2.4 oz (94.9 kg)  02/19/20 212 lb (96.2 kg)    General: Patient appears comfortable at rest. Neck: Supple, no elevated JVP or carotid bruits, no thyromegaly. Lungs: Clear to auscultation, nonlabored breathing at rest. Cardiac: Regular rate and rhythm, no S3 or significant systolic murmur, no pericardial rub. Extremities: No pitting edema, distal pulses 2+. Skin: Warm and dry. Right groin access site clean and dry without redness swelling, discoloration. Musculoskeletal: No kyphosis. Neuropsychiatric: Alert and oriented x3, affect grossly appropriate.  ECG:  An ECG dated 09/10/2020 was personally reviewed today and demonstrated:  Sinus rhythm rate of 66, lateral infarct, age undetermined, possible inferior infarct, age undetermined  Recent Labwork: 08/19/2020: Hemoglobin 12.4; Platelets 194 08/20/2020: ALT 10; AST 15; BUN 10; Creatinine, Ser 1.29; Potassium 4.5; Sodium 138     Component Value Date/Time   CHOL 108 08/20/2020 0336   TRIG 89 08/20/2020 0336   HDL 46 08/20/2020 0336   CHOLHDL 2.3 08/20/2020 0336   VLDL 18 08/20/2020 0336   LDLCALC 44 08/20/2020 0336    Other Studies Reviewed Today:  Echocardiogram 08/19/2020 1. Left ventricular ejection fraction, by estimation, is 55 to 60%. The left ventricle has normal function. The left ventricle demonstrates regional wall motion abnormalities (see scoring diagram/findings for description). There is mild left ventricular hypertrophy. Left ventricular diastolic parameters were normal. 2. Right ventricular systolic function is normal. The right ventricular size is normal. There is normal pulmonary artery systolic pressure. The estimated right ventricular systolic pressure is 70.6 mmHg. 3. The  mitral valve is grossly normal. Trivial mitral valve regurgitation. 4. The aortic valve is tricuspid. Aortic valve regurgitation is not visualized. Mild aortic valve sclerosis is present, with no evidence of aortic valve stenosis. 5. The inferior vena cava is dilated in size with >50% respiratory variability, suggesting right atrial pressure of 8 mmHg.     08/19/2020 LEFT HEART CATH AND CORS/GRAFTS ANGIOGRAPHY  Conclusion  Conclusions: 1. Severe native coronary artery disease, including 60% LMCA disease, chronic total occlusions of ostial LAD and mid RCA, and 80% proximal ramus intermedius stenosis. 2. Widely patent LIMA-LAD. 3. Patent SVG-D1 with mild to moderate in-stent restenosis in the proximal and  distal graft stents. 4. Chronically occluded SVG-RCA. 5. Low normal left ventricular systolic function with mid anterior hypokinesis. 6. Mildly to moderately elevated left ventricular filling pressure.  Recommendations: 1. Continue aggressive medical therapy.  Appearance of native coronary arteries and bypasses is not significantly different compared with prior catheterization in 11/2015. 2. Escalate diuresis, as worsening HFpEF may be contributing to the patient's symptoms. 3. Aggressive secondary prevention of coronary artery disease.  Coronary Diagrams  Diagnostic Dominance: Right   Cardiac catheterization 11/22/2015: 1. Ramus lesion, 95% stenosed. 2. Saphenous vein bypass graft disease with total occlusion of the SVG to the distal RCA. The sequential SVG to the diagonal and ramus intermedius is disease with occlusion of the limb to the ramus intermedius. The side-to-side anastomosis on the diagonal remains patent. The stents in the ostium and distal saphenous vein graft sites are widely patent. 3. Patent LIMA to LAD 4. Total occlusion of the native right. The distal right coronary/PDA fills byleft-to-right collaterals from the LAD. Segmental diffuse disease in the left main.  Total occlusion of the LAD. High-grade obstruction in the circumflex and ramus intermedius originating at the left main. 5. Widely patent LIMA to the LAD  When compared to the prior angiographic images, no significant change has occurred. Inferior wall hypokinesis, estimated ejection fraction 45%  Assessment and Plan:  1. Accelerating angina ([place])   2. CAD in native artery   3. Mixed hyperlipidemia   4. Cardiomyopathy, ischemic   5. Tobacco abuse    1. Accelerating angina [facility]) Currently denies any anginal symptoms. States she has been walking with his wife some since cardiac catheterization. States he is trying to eat better. States he has been eating some deer meat and grapes for snacks at home. Continue aspirin 81 mg daily, Plavix 75 mg daily, Imdur 60 mg daily, nitroglycerin sublingual as needed, pravastatin 80 mg daily. Continue Ranexa 1000 mg p.o. twice daily.  2. CAD in native artery Cardiac catheterization performed for accelerating angina at last visit on 08/19/2020 : LMCA 60%, CTO's of ostial LAD and RCA. prox RI 80%, Widely patent LIMA-LAD. Patent SVG-D1 mild to mod ISR in prox. and distal graft stents. No significant difference compared to previous cath 2017. Recommended escalating diuretic therapy as worsening HFpEF may be contributing to symptoms. Patient's Lasix was increased to 40 mg daily after catheterization. Patient states his symptoms are much better.   3. Mixed hyperlipidemia Continue pravastatin 80 mg daily. Recent lipid panel 08/20/2020: TC 108, TG 89, HDL 46, LDL 44  4. Cardiomyopathy, ischemic Recent echocardiogram 08/19/2020 EF 55 to 60%. LV with R WMA's. Mild LVH, trivial MR  5. Tobacco abuse Patient continues to smoke. He states he is finding it very hard to quit. He states that at 1 point a few years ago he stopped for 6 months but started back. He states which I had never started back. Encouraged him to attempt to stop if possible. Reinforced the  deleterious effects of smoking on heart disease, lungs, and blood vessels. He verbalizes understanding  Medication Adjustments/Labs and Tests Ordered: Current medicines are reviewed at length with the patient today.  Concerns regarding medicines are outlined above.   Disposition: Follow-up with Dr. Domenic Polite or APP 6 months  Signed, Levell July, NP 09/10/2020 10:39 AM    [facility] [place], [place], [place] [address] Phone: [phone]; Fax: [phone]

## 2020-09-10 ENCOUNTER — Ambulatory Visit (INDEPENDENT_AMBULATORY_CARE_PROVIDER_SITE_OTHER): Payer: Medicare Other | Admitting: Family Medicine

## 2020-09-10 ENCOUNTER — Encounter: Payer: Self-pay | Admitting: Family Medicine

## 2020-09-10 VITALS — BP 118/82 | HR 66 | Ht 64.0 in | Wt 210.0 lb

## 2020-09-10 DIAGNOSIS — Z720 Tobacco use: Secondary | ICD-10-CM

## 2020-09-10 DIAGNOSIS — I255 Ischemic cardiomyopathy: Secondary | ICD-10-CM | POA: Diagnosis not present

## 2020-09-10 DIAGNOSIS — I2510 Atherosclerotic heart disease of native coronary artery without angina pectoris: Secondary | ICD-10-CM | POA: Diagnosis not present

## 2020-09-10 DIAGNOSIS — E782 Mixed hyperlipidemia: Secondary | ICD-10-CM

## 2020-09-10 DIAGNOSIS — I200 Unstable angina: Secondary | ICD-10-CM | POA: Diagnosis not present

## 2020-09-10 MED ORDER — FUROSEMIDE 40 MG PO TABS
40.0000 mg | ORAL_TABLET | Freq: Every day | ORAL | 2 refills | Status: DC
Start: 2020-09-10 — End: 2021-06-27

## 2020-09-10 NOTE — Patient Instructions (Signed)

## 2020-09-16 DIAGNOSIS — K219 Gastro-esophageal reflux disease without esophagitis: Secondary | ICD-10-CM | POA: Diagnosis not present

## 2020-09-16 DIAGNOSIS — J449 Chronic obstructive pulmonary disease, unspecified: Secondary | ICD-10-CM | POA: Diagnosis not present

## 2020-09-16 DIAGNOSIS — E1169 Type 2 diabetes mellitus with other specified complication: Secondary | ICD-10-CM | POA: Diagnosis not present

## 2020-09-20 ENCOUNTER — Other Ambulatory Visit: Payer: Self-pay | Admitting: Cardiology

## 2020-10-14 DIAGNOSIS — H40013 Open angle with borderline findings, low risk, bilateral: Secondary | ICD-10-CM | POA: Diagnosis not present

## 2020-10-17 ENCOUNTER — Other Ambulatory Visit: Payer: Self-pay | Admitting: Cardiology

## 2020-11-02 DIAGNOSIS — G4733 Obstructive sleep apnea (adult) (pediatric): Secondary | ICD-10-CM | POA: Diagnosis not present

## 2020-11-02 DIAGNOSIS — R42 Dizziness and giddiness: Secondary | ICD-10-CM | POA: Diagnosis not present

## 2020-11-02 DIAGNOSIS — H9313 Tinnitus, bilateral: Secondary | ICD-10-CM | POA: Diagnosis not present

## 2020-11-02 DIAGNOSIS — G4459 Other complicated headache syndrome: Secondary | ICD-10-CM | POA: Diagnosis not present

## 2020-12-15 DIAGNOSIS — E1169 Type 2 diabetes mellitus with other specified complication: Secondary | ICD-10-CM | POA: Diagnosis not present

## 2020-12-15 DIAGNOSIS — Z79899 Other long term (current) drug therapy: Secondary | ICD-10-CM | POA: Diagnosis not present

## 2020-12-15 DIAGNOSIS — Z0000 Encounter for general adult medical examination without abnormal findings: Secondary | ICD-10-CM | POA: Diagnosis not present

## 2020-12-15 DIAGNOSIS — K219 Gastro-esophageal reflux disease without esophagitis: Secondary | ICD-10-CM | POA: Diagnosis not present

## 2020-12-15 DIAGNOSIS — J449 Chronic obstructive pulmonary disease, unspecified: Secondary | ICD-10-CM | POA: Diagnosis not present

## 2020-12-15 DIAGNOSIS — Z131 Encounter for screening for diabetes mellitus: Secondary | ICD-10-CM | POA: Diagnosis not present

## 2020-12-28 DIAGNOSIS — R42 Dizziness and giddiness: Secondary | ICD-10-CM | POA: Diagnosis not present

## 2020-12-28 DIAGNOSIS — G4459 Other complicated headache syndrome: Secondary | ICD-10-CM | POA: Diagnosis not present

## 2020-12-28 DIAGNOSIS — G4733 Obstructive sleep apnea (adult) (pediatric): Secondary | ICD-10-CM | POA: Diagnosis not present

## 2020-12-28 DIAGNOSIS — G44029 Chronic cluster headache, not intractable: Secondary | ICD-10-CM | POA: Diagnosis not present

## 2020-12-28 DIAGNOSIS — H9313 Tinnitus, bilateral: Secondary | ICD-10-CM | POA: Diagnosis not present

## 2021-01-10 DIAGNOSIS — W57XXXA Bitten or stung by nonvenomous insect and other nonvenomous arthropods, initial encounter: Secondary | ICD-10-CM | POA: Diagnosis not present

## 2021-01-10 DIAGNOSIS — L989 Disorder of the skin and subcutaneous tissue, unspecified: Secondary | ICD-10-CM | POA: Diagnosis not present

## 2021-01-12 DIAGNOSIS — S30861A Insect bite (nonvenomous) of abdominal wall, initial encounter: Secondary | ICD-10-CM | POA: Diagnosis not present

## 2021-01-12 DIAGNOSIS — L089 Local infection of the skin and subcutaneous tissue, unspecified: Secondary | ICD-10-CM | POA: Diagnosis not present

## 2021-01-12 DIAGNOSIS — W57XXXA Bitten or stung by nonvenomous insect and other nonvenomous arthropods, initial encounter: Secondary | ICD-10-CM | POA: Diagnosis not present

## 2021-01-26 DIAGNOSIS — G4733 Obstructive sleep apnea (adult) (pediatric): Secondary | ICD-10-CM | POA: Diagnosis not present

## 2021-01-26 DIAGNOSIS — G44029 Chronic cluster headache, not intractable: Secondary | ICD-10-CM | POA: Diagnosis not present

## 2021-01-26 DIAGNOSIS — G4459 Other complicated headache syndrome: Secondary | ICD-10-CM | POA: Diagnosis not present

## 2021-01-26 DIAGNOSIS — H9313 Tinnitus, bilateral: Secondary | ICD-10-CM | POA: Diagnosis not present

## 2021-01-26 DIAGNOSIS — R42 Dizziness and giddiness: Secondary | ICD-10-CM | POA: Diagnosis not present

## 2021-01-26 DIAGNOSIS — G44019 Episodic cluster headache, not intractable: Secondary | ICD-10-CM | POA: Diagnosis not present

## 2021-03-16 ENCOUNTER — Ambulatory Visit: Payer: Medicare Other | Admitting: Cardiology

## 2021-03-17 DIAGNOSIS — Z0000 Encounter for general adult medical examination without abnormal findings: Secondary | ICD-10-CM | POA: Diagnosis not present

## 2021-03-17 DIAGNOSIS — K219 Gastro-esophageal reflux disease without esophagitis: Secondary | ICD-10-CM | POA: Diagnosis not present

## 2021-03-17 DIAGNOSIS — J449 Chronic obstructive pulmonary disease, unspecified: Secondary | ICD-10-CM | POA: Diagnosis not present

## 2021-03-17 DIAGNOSIS — E1169 Type 2 diabetes mellitus with other specified complication: Secondary | ICD-10-CM | POA: Diagnosis not present

## 2021-03-23 DIAGNOSIS — H905 Unspecified sensorineural hearing loss: Secondary | ICD-10-CM | POA: Diagnosis not present

## 2021-03-30 ENCOUNTER — Other Ambulatory Visit: Payer: Self-pay | Admitting: Cardiology

## 2021-04-07 DIAGNOSIS — H01004 Unspecified blepharitis left upper eyelid: Secondary | ICD-10-CM | POA: Diagnosis not present

## 2021-04-08 ENCOUNTER — Encounter (HOSPITAL_BASED_OUTPATIENT_CLINIC_OR_DEPARTMENT_OTHER): Payer: Self-pay

## 2021-04-08 DIAGNOSIS — G4733 Obstructive sleep apnea (adult) (pediatric): Secondary | ICD-10-CM

## 2021-04-14 NOTE — Progress Notes (Signed)
Cardiology Office Note  Date: 04/15/2021   ID: [name], DOB 1965-10-25, MRN [id number]  PCP:  [name], MD  Cardiologist:  [name], MD Electrophysiologist:  None   Chief Complaint: Accelerating angina, CAD, HLD, ischemic cardiomyopathy, tobacco abuse  History of Present Illness: [name] is a 55 y.o. male with a history of accelerating angina, CAD, HLD, ischemic cardiomyopathy, tobacco abuse.  Last encounter with Dr Domenic Polite on 02/19/2020. He had been experiencing more anginal symptoms and progressive DOE with more frequent use of NTG. He was continuing aspirin, Plavix, Coreg, Imdur, Lisinopril, Pravachol, and Ranexa.  Previous cardiac catheterization 2017 : Combination of graft disease and progressive native vessel disease with collateral circulation. Had patent stents in ostial and distal SVG sites. Managed medically. Follow up cardiac catheterization was ordered. He had not been able to stop smoking.  Cardiac catheterization on 08/19/2020 : LMCA 60%, CTO's of ostial LAD and RCA. prox RI 80%, Widely patent LIMA-LAD. Patent SVG-D1 mild to mod ISR in prox. and distal graft stents. No significant difference compared to previous cath 2017. Recommended escalating diuretic therapy as worsening HFpEF may be contributing to symptoms.   He is here today for follow-up status post recent cardiac catheterization. He denies any anginal symptoms or exertional symptoms. States he started walking some with his wife. States he has an occasional "twinge" in his chest which is short-lived. He denies any radiation to neck, arm, back, jaw denies any associated dizziness, diaphoresis, nausea, vomiting. He states he has less swelling in his legs since increasing the dose of Lasix. He continues to smoke but states he is smoking less. States he would like to quit but finding it very hard to stop.  He is here today for 30-monthfollow-up.  He denies any recent illnesses or  hospitalizations.  States he notices an occasional mild brief chest pain short in duration without radiation or associated symptoms.  Otherwise denies any anginal or exertional symptoms.  Denies any palpitations or arrhythmias, orthostatic symptoms, CVA or TIA-like symptoms, PND or orthopnea.  Denies any claudication-like symptoms, DVT or PE-like symptoms, or lower extremity edema.  Blood pressure continues to be well controlled.  He is maintaining his weight at 208 today.   Past Medical History:  Diagnosis Date   Asthma    Bipolar disorder ([facility]    Chronic back pain    Coronary atherosclerosis of native coronary artery    a. CABG 2003. b. NSTEMI 2010 s/p BMS to SVG-RCA c. PTCA/stent/asp thromb to SVG-RCA 01/2010, PTCA to SVG-RCA 04/2010 d. known occlusion of SVG-RCA 2013. e. NSTEMI 07/2013  s/p PTCA/DES to SVG-diagonal/intermediate/OM1.    Essential hypertension    GERD (gastroesophageal reflux disease)    Gout    Ischemic cardiomyopathy    a. EF 35-40% by cath 07/2013.   Mixed hyperlipidemia    Sinus bradycardia    Type 2 diabetes mellitus ([facility]     Past Surgical History:  Procedure Laterality Date   CARDIAC CATHETERIZATION  06/19/2014   Procedure: CORONARY STENT INTERVENTION;  Surgeon: DLeonie Man MD;  [facility];  Service: Cardiovascular;;  DES to seg SVG Diag/OM/Ramus    CARDIAC CATHETERIZATION N/A 11/22/2015   Procedure: Left Heart Cath and Coronary Angiography;  Surgeon: HBelva Crome MD;  [facility];  Service: Cardiovascular;  Laterality: N/A;   CARDIAC CATHETERIZATION  08/19/2020   CHOLECYSTECTOMY     CORONARY ARTERY BYPASS GRAFT     2003, LIMA  to LAD; SVG to diagonal; SVG to OM, ramus, and diagonal; SVG to RCA   LEFT HEART CATH AND CORS/GRAFTS ANGIOGRAPHY N/A 08/19/2020   Procedure: LEFT HEART CATH AND CORS/GRAFTS ANGIOGRAPHY;  Surgeon: Nelva Bush, MD;  Location: [place] CV LAB;  Service: Cardiovascular;  Laterality: N/A;   LEFT  HEART CATHETERIZATION WITH CORONARY ANGIOGRAM N/A 01/28/2015   Procedure: LEFT HEART CATHETERIZATION WITH CORONARY ANGIOGRAM;  Surgeon: Leonie Man, MD;  Location: [facility] CATH LAB;  Service: Cardiovascular;  Laterality: N/A;   LEFT HEART CATHETERIZATION WITH CORONARY/GRAFT ANGIOGRAM N/A 07/14/2013   Procedure: LEFT HEART CATHETERIZATION WITH Beatrix Fetters;  Surgeon: Burnell Blanks, MD;  Location: [facility] CATH LAB;  Service: Cardiovascular;  Laterality: N/A;   LEFT HEART CATHETERIZATION WITH CORONARY/GRAFT ANGIOGRAM N/A 12/29/2013   Procedure: LEFT HEART CATHETERIZATION WITH Beatrix Fetters;  Surgeon: Leonie Man, MD;  Location: [facility] CATH LAB;  Service: Cardiovascular;  Laterality: N/A;   LEFT HEART CATHETERIZATION WITH CORONARY/GRAFT ANGIOGRAM N/A 06/19/2014   Procedure: LEFT HEART CATHETERIZATION WITH Beatrix Fetters;  Surgeon: Leonie Man, MD;  Location[facility] LAB;  Service: Cardiovascular;  Laterality: N/A;   Metal plate to chin     PERCUTANEOUS CORONARY STENT INTERVENTION (PCI-S)  07/14/2013   Procedure: PERCUTANEOUS CORONARY STENT INTERVENTION (PCI-S);  Surgeon: Burnell Blanks, MD;  Location: [facility] CATH LAB;  Service: Cardiovascular;;   Pin placement left leg      Current Outpatient Medications  Medication Sig Dispense Refill   acetaminophen (TYLENOL) 325 MG tablet Take 2 tablets (650 mg total) by mouth every 4 (four) hours as needed for headache or mild pain.     albuterol (PROVENTIL HFA;VENTOLIN HFA) 108 (90 BASE) MCG/ACT inhaler Inhale 2 puffs into the lungs every 6 (six) hours as needed for wheezing or shortness of breath.      allopurinol (ZYLOPRIM) 300 MG tablet Take 300 mg by mouth daily.  0   aspirin EC 81 MG tablet Take 1 tablet (81 mg total) by mouth daily.     Blood Pressure Monitoring KIT 1 each by Does not apply route as directed. 1 kit 0   carvedilol (COREG) 3.125 MG tablet TAKE ONE TABLET BY MOUTH 2 TIMES A DAY WITH A MEAL 180 tablet 2    clopidogrel (PLAVIX) 75 MG tablet TAKE ONE TABLET BY MOUTH DAILY 90 tablet 3   diazepam (VALIUM) 10 MG tablet Take 10 mg by mouth 2 (two) times daily.     FLOVENT HFA 220 MCG/ACT inhaler Inhale 1 puff into the lungs daily.      furosemide (LASIX) 40 MG tablet Take 1 tablet (40 mg total) by mouth daily. 90 tablet 2   isosorbide mononitrate (IMDUR) 60 MG 24 hr tablet TAKE ONE TABLET BY MOUTH 2 TIMES A DAY 180 tablet 2   lisinopril (ZESTRIL) 2.5 MG tablet TAKE ONE TABLET BY MOUTH DAILY 90 tablet 2   metFORMIN (GLUCOPHAGE) 1000 MG tablet Take 1,000 mg by mouth daily with breakfast.     nitroGLYCERIN (NITROSTAT) 0.4 MG SL tablet PLACE 1 TABLET UNDER THE TONGUE AS NEEDED FOR CHEST PAIN. MAY REPEAT EVERY 5 MINUTES UP TO 3 DOSES. IF NO RELIEF AFTER 3 DOSES, CALL 911 OR (Patient taking differently: Place 0.4 mg under the tongue every 5 (five) minutes as needed for chest pain.) 25 tablet 0   omega-3 acid ethyl esters (LOVAZA) 1 g capsule Take 1 capsule by mouth every morning. & 2 capsules at night     ONETOUCH VERIO test strip  pantoprazole (PROTONIX) 40 MG tablet Take 40 mg by mouth daily.      pravastatin (PRAVACHOL) 80 MG tablet TAKE ONE TABLET BY MOUTH EVERY EVENING 90 tablet 3   ranolazine (RANEXA) 1000 MG SR tablet TAKE ONE TABLET BY MOUTH 2 TIMES A DAY 180 tablet 3   No current facility-administered medications for this visit.   Allergies:  Patient has no known allergies.   Social History: The patient  reports that he has been smoking cigarettes. He started smoking about 42 years ago. He has a 26.40 pack-year smoking history. He has never used smokeless tobacco. He reports current drug use. Frequency: 2.00 times per week. Drug: Marijuana. He reports that he does not drink alcohol.   Family History: The patient's family history includes Bowel Disease in his mother; Hernia in his father; Pneumonia in his daughter; Stroke in an other family member.   ROS:  Please see the history of present  illness. Otherwise, complete review of systems is positive for none.  All other systems are reviewed and negative.   Physical Exam: VS:  BP 122/80   Pulse 66   Ht 5' 4"  (1.626 m)   Wt 208 lb 11.2 oz (94.7 kg)   SpO2 96%   BMI 35.82 kg/m , BMI Body mass index is 35.82 kg/m.  Wt Readings from Last 3 Encounters:  04/15/21 208 lb 11.2 oz (94.7 kg)  09/10/20 210 lb (95.3 kg)  08/20/20 209 lb 2.4 oz (94.9 kg)    General: Patient appears comfortable at rest. Neck: Supple, no elevated JVP or carotid bruits, no thyromegaly. Lungs: Clear to auscultation, nonlabored breathing at rest. Cardiac: Regular rate and rhythm, no S3 or significant systolic murmur, no pericardial rub. Extremities: No pitting edema, distal pulses 2+. Skin: Warm and dry.  Musculoskeletal: No kyphosis. Neuropsychiatric: Alert and oriented x3, affect grossly appropriate.  ECG:  An ECG dated 09/10/2020 was personally reviewed today and demonstrated:  Sinus rhythm rate of 66, lateral infarct, age undetermined, possible inferior infarct, age undetermined  Recent Labwork: 08/19/2020: Hemoglobin 12.4; Platelets 194 08/20/2020: ALT 10; AST 15; BUN 10; Creatinine, Ser 1.29; Potassium 4.5; Sodium 138     Component Value Date/Time   CHOL 108 08/20/2020 0336   TRIG 89 08/20/2020 0336   HDL 46 08/20/2020 0336   CHOLHDL 2.3 08/20/2020 0336   VLDL 18 08/20/2020 0336   LDLCALC 44 08/20/2020 0336    Other Studies Reviewed Today:  Echocardiogram 08/19/2020 1. Left ventricular ejection fraction, by estimation, is 55 to 60%. The left ventricle has normal function. The left ventricle demonstrates regional wall motion abnormalities (see scoring diagram/findings for description). There is mild left ventricular hypertrophy. Left ventricular diastolic parameters were normal. 2. Right ventricular systolic function is normal. The right ventricular size is normal. There is normal pulmonary artery systolic pressure. The estimated right  ventricular systolic pressure is 66.5 mmHg. 3. The mitral valve is grossly normal. Trivial mitral valve regurgitation. 4. The aortic valve is tricuspid. Aortic valve regurgitation is not visualized. Mild aortic valve sclerosis is present, with no evidence of aortic valve stenosis. 5. The inferior vena cava is dilated in size with >50% respiratory variability, suggesting right atrial pressure of 8 mmHg.  08/19/2020 LEFT HEART CATH AND CORS/GRAFTS ANGIOGRAPHY  Conclusion  Conclusions: Severe native coronary artery disease, including 60% LMCA disease, chronic total occlusions of ostial LAD and mid RCA, and 80% proximal ramus intermedius stenosis. Widely patent LIMA-LAD. Patent SVG-D1 with mild to moderate in-stent restenosis in the proximal and  distal graft stents. Chronically occluded SVG-RCA. Low normal left ventricular systolic function with mid anterior hypokinesis. Mildly to moderately elevated left ventricular filling pressure.   Recommendations: Continue aggressive medical therapy.  Appearance of native coronary arteries and bypasses is not significantly different compared with prior catheterization in 11/2015. Escalate diuresis, as worsening HFpEF may be contributing to the patient's symptoms. Aggressive secondary prevention of coronary artery disease.  Coronary Diagrams  Diagnostic Dominance: Right   Cardiac catheterization 11/22/2015: 1. Ramus lesion, 95% stenosed. 2. Saphenous vein bypass graft disease with total occlusion of the SVG to the distal RCA. The sequential SVG to the diagonal and ramus intermedius is disease with occlusion of the limb to the ramus intermedius. The side-to-side anastomosis on the diagonal remains patent. The stents in the ostium and distal saphenous vein graft sites are widely patent.  3. Patent LIMA to LAD  4. Total occlusion of the native right. The distal right coronary/PDA fills by left-to-right collaterals from the LAD. Segmental diffuse  disease in the left main. Total occlusion of the LAD. High-grade obstruction in the circumflex and ramus intermedius originating at the left main.  5. Widely patent LIMA to the LAD   When compared to the prior angiographic images, no significant change has occurred. Inferior wall hypokinesis, estimated ejection fraction 45%  Assessment and Plan:  1. CAD in native artery   2. Mixed hyperlipidemia   3. Cardiomyopathy, ischemic   4. Tobacco abuse      1. CAD in native artery Cardiac catheterization performed for accelerating angina at last visit on 08/19/2020 : LMCA 60%, CTO's of ostial LAD and RCA. prox RI 80%, Widely patent LIMA-LAD. Patent SVG-D1 mild to mod ISR in prox. and distal graft stents. No significant difference compared to previous cath 2017. Recommended escalating diuretic therapy as worsening HFpEF may be contributing to symptoms. Patient's Lasix was increased to 40 mg daily after catheterization.  Today he states that occasional fleeting chest pain which is sporadic in nature and very infrequent.  Continue current regimen of aspirin 81 mg daily, carvedilol 3.125 mg p.o. twice daily, Plavix 75 mg daily, Imdur 60 mg p.o. twice daily, lisinopril 2.5 mg p.o. daily, sublingual nitroglycerin as needed.  Continue Ranexa 1000 mg p.o. twice daily.  2. Mixed hyperlipidemia Continue pravastatin 80 mg daily. Recent lipid panel 08/20/2020: TC 108, TG 89, HDL 46, LDL 44  3. Cardiomyopathy, ischemic Recent echocardiogram 08/19/2020 EF 55 to 60%. LV with R WMA's. Mild LVH, trivial MR.  Currently denies any DOE or SOB.  Continue Lasix 40 mg daily.  Continue carvedilol 3.125 mg p.o. twice daily.  Continue lisinopril 2.5 mg daily.  4. Tobacco abuse At last visit he continued to smoke.  He was finding it very hard to quit.  Reinforced cessation today  Medication Adjustments/Labs and Tests Ordered: Current medicines are reviewed at length with the patient today.  Concerns regarding medicines are  outlined above.   Disposition: Follow-up with Dr. Domenic Polite or APP 6 months  Signed, Levell July, NP 04/15/2021 12:02 PM    [facility] at [place], [name], [place] [address] Phone: [phone]; Fax: [phone]

## 2021-04-15 ENCOUNTER — Encounter: Payer: Self-pay | Admitting: Family Medicine

## 2021-04-15 ENCOUNTER — Ambulatory Visit (INDEPENDENT_AMBULATORY_CARE_PROVIDER_SITE_OTHER): Payer: Medicare Other | Admitting: Family Medicine

## 2021-04-15 VITALS — BP 122/80 | HR 66 | Ht 64.0 in | Wt 208.7 lb

## 2021-04-15 DIAGNOSIS — I2510 Atherosclerotic heart disease of native coronary artery without angina pectoris: Secondary | ICD-10-CM | POA: Diagnosis not present

## 2021-04-15 DIAGNOSIS — Z720 Tobacco use: Secondary | ICD-10-CM | POA: Diagnosis not present

## 2021-04-15 DIAGNOSIS — I255 Ischemic cardiomyopathy: Secondary | ICD-10-CM | POA: Diagnosis not present

## 2021-04-15 DIAGNOSIS — E782 Mixed hyperlipidemia: Secondary | ICD-10-CM

## 2021-04-15 NOTE — Patient Instructions (Signed)
Medication Instructions:  Your physician recommends that you continue on your current medications as directed. Please refer to the Current Medication list given to you today.  *If you need a refill on your cardiac medications before your next appointment, please call your pharmacy*   Lab Work: None If you have labs (blood work) drawn today and your tests are completely normal, you will receive your results only by: MyChart Message (if you have MyChart) OR A paper copy in the mail If you have any lab test that is abnormal or we need to change your treatment, we will call you to review the results.   Testing/Procedures: None   Follow-Up: At [facility], you and your health needs are our priority.  As part of our continuing mission to provide you with exceptional heart care, we have created designated Provider Care Teams.  These Care Teams include your primary Cardiologist (physician) and Advanced Practice Providers (APPs -  Physician Assistants and Nurse Practitioners) who all work together to provide you with the care you need, when you need it.  We recommend signing up for the patient portal called "MyChart".  Sign up information is provided on this After Visit Summary.  MyChart is used to connect with patients for Virtual Visits (Telemedicine).  Patients are able to view lab/test results, encounter notes, upcoming appointments, etc.  Non-urgent messages can be sent to your provider as well.   To learn more about what you can do with MyChart, go to [email or web address].    Your next appointment:   6 month(s)  The format for your next appointment:   In Person  Provider:   Andy Quinn, NP   Other Instructions    

## 2021-04-19 ENCOUNTER — Ambulatory Visit: Payer: Medicare Other | Attending: Neurology | Admitting: Neurology

## 2021-04-19 ENCOUNTER — Other Ambulatory Visit: Payer: Self-pay

## 2021-04-19 DIAGNOSIS — G4733 Obstructive sleep apnea (adult) (pediatric): Secondary | ICD-10-CM | POA: Insufficient documentation

## 2021-04-28 NOTE — Procedures (Signed)
[place] A. Merlene Laughter, MD     [email or web address]             HOME SLEEP TEST   LOCATION: [facility]   Patient Name: [name] [name], [name] [name] Date: 04/19/2021 Gender: Male D.O.B: [birth year] Age (years): 61 Referring Provider: Barton Fanny NP Height (inches): 49 Interpreting Physician: Phillips Odor MD, ABSM Weight (lbs): 208 RPSGT: Peak, Robert BMI: 36 MRN: [id number] Neck Size: CLINICAL INFORMATION Sleep Study Type: HST     Indication for sleep study: N/A     Epworth Sleepiness Score: N/A  SLEEP STUDY TECHNIQUE A multi-channel overnight portable sleep study was performed. The channels recorded were: nasal airflow, thoracic respiratory movement, and oxygen saturation with a pulse oximetry. Snoring was also monitored.  MEDICATIONS Patient self administered medications include: N/A.  Current Outpatient Medications:    acetaminophen (TYLENOL) 325 MG tablet, Take 2 tablets (650 mg total) by mouth every 4 (four) hours as needed for headache or mild pain., Disp: , Rfl:    albuterol (PROVENTIL HFA;VENTOLIN HFA) 108 (90 BASE) MCG/ACT inhaler, Inhale 2 puffs into the lungs every 6 (six) hours as needed for wheezing or shortness of breath. , Disp: , Rfl:    allopurinol (ZYLOPRIM) 300 MG tablet, Take 300 mg by mouth daily., Disp: , Rfl: 0   aspirin EC 81 MG tablet, Take 1 tablet (81 mg total) by mouth daily., Disp: , Rfl:    Blood Pressure Monitoring KIT, 1 each by Does not apply route as directed., Disp: 1 kit, Rfl: 0   carvedilol (COREG) 3.125 MG tablet, TAKE ONE TABLET BY MOUTH 2 TIMES A DAY WITH A MEAL, Disp: 180 tablet, Rfl: 2   clopidogrel (PLAVIX) 75 MG tablet, TAKE ONE TABLET BY MOUTH DAILY, Disp: 90 tablet, Rfl: 3   diazepam (VALIUM) 10 MG tablet, Take 10 mg by mouth 2 (two) times daily., Disp: , Rfl:    FLOVENT HFA 220 MCG/ACT inhaler, Inhale 1 puff into the lungs daily. , Disp: , Rfl:    furosemide (LASIX) 40 MG tablet, Take 1 tablet (40 mg total)  by mouth daily., Disp: 90 tablet, Rfl: 2   isosorbide mononitrate (IMDUR) 60 MG 24 hr tablet, TAKE ONE TABLET BY MOUTH 2 TIMES A DAY, Disp: 180 tablet, Rfl: 2   lisinopril (ZESTRIL) 2.5 MG tablet, TAKE ONE TABLET BY MOUTH DAILY, Disp: 90 tablet, Rfl: 2   metFORMIN (GLUCOPHAGE) 1000 MG tablet, Take 1,000 mg by mouth daily with breakfast., Disp: , Rfl:    nitroGLYCERIN (NITROSTAT) 0.4 MG SL tablet, PLACE 1 TABLET UNDER THE TONGUE AS NEEDED FOR CHEST PAIN. MAY REPEAT EVERY 5 MINUTES UP TO 3 DOSES. IF NO RELIEF AFTER 3 DOSES, CALL 911 OR (Patient taking differently: Place 0.4 mg under the tongue every 5 (five) minutes as needed for chest pain.), Disp: 25 tablet, Rfl: 0   omega-3 acid ethyl esters (LOVAZA) 1 g capsule, Take 1 capsule by mouth every morning. & 2 capsules at night, Disp: , Rfl:    ONETOUCH VERIO test strip, , Disp: , Rfl:    pantoprazole (PROTONIX) 40 MG tablet, Take 40 mg by mouth daily. , Disp: , Rfl:    pravastatin (PRAVACHOL) 80 MG tablet, TAKE ONE TABLET BY MOUTH EVERY EVENING, Disp: 90 tablet, Rfl: 3   ranolazine (RANEXA) 1000 MG SR tablet, TAKE ONE TABLET BY MOUTH 2 TIMES A DAY, Disp: 180 tablet, Rfl: 3   SLEEP ARCHITECTURE Patient was studied for 614.2 minutes. The sleep efficiency was 91.8 % and the patient  was supine for 60.1%. The arousal index was 0.0 per hour.  RESPIRATORY PARAMETERS The overall AHI was 28.0 per hour, with a central apnea index of 1 per hour.  The oxygen nadir was 78% during sleep.     CARDIAC DATA Mean heart rate during sleep was 67.8 bpm.  IMPRESSIONS Moderate obstructive sleep apnea occurred during this study (AHI = 28.0/h). AutoPAP 8-14 is recommended.  Delano Metz, MD Diplomate, American Board of Sleep Medicine.  ELECTRONICALLY SIGNED ON:  04/28/2021, 5:06 PM [place] PH: [phone]   FX: [phone] [place]

## 2021-05-17 ENCOUNTER — Other Ambulatory Visit: Payer: Self-pay | Admitting: Cardiology

## 2021-06-16 DIAGNOSIS — J449 Chronic obstructive pulmonary disease, unspecified: Secondary | ICD-10-CM | POA: Diagnosis not present

## 2021-06-16 DIAGNOSIS — H01004 Unspecified blepharitis left upper eyelid: Secondary | ICD-10-CM | POA: Diagnosis not present

## 2021-06-16 DIAGNOSIS — E1143 Type 2 diabetes mellitus with diabetic autonomic (poly)neuropathy: Secondary | ICD-10-CM | POA: Diagnosis not present

## 2021-06-16 DIAGNOSIS — E0843 Diabetes mellitus due to underlying condition with diabetic autonomic (poly)neuropathy: Secondary | ICD-10-CM | POA: Diagnosis not present

## 2021-06-16 DIAGNOSIS — K219 Gastro-esophageal reflux disease without esophagitis: Secondary | ICD-10-CM | POA: Diagnosis not present

## 2021-06-16 DIAGNOSIS — M1009 Idiopathic gout, multiple sites: Secondary | ICD-10-CM | POA: Diagnosis not present

## 2021-06-27 ENCOUNTER — Other Ambulatory Visit: Payer: Self-pay | Admitting: Cardiology

## 2021-06-27 ENCOUNTER — Other Ambulatory Visit: Payer: Self-pay | Admitting: Family Medicine

## 2021-07-01 ENCOUNTER — Telehealth: Payer: Self-pay | Admitting: Cardiology

## 2021-07-01 ENCOUNTER — Other Ambulatory Visit: Payer: Self-pay

## 2021-07-01 ENCOUNTER — Observation Stay (HOSPITAL_COMMUNITY)
Admission: EM | Admit: 2021-07-01 | Discharge: 2021-07-02 | Disposition: A | Discharge status: Home / Self Care | Payer: Medicare Other | Attending: Cardiology | Admitting: Cardiology

## 2021-07-01 ENCOUNTER — Emergency Department (HOSPITAL_COMMUNITY): Payer: Medicare Other

## 2021-07-01 ENCOUNTER — Encounter (HOSPITAL_COMMUNITY): Payer: Self-pay

## 2021-07-01 DIAGNOSIS — Z7984 Long term (current) use of oral hypoglycemic drugs: Secondary | ICD-10-CM | POA: Diagnosis not present

## 2021-07-01 DIAGNOSIS — Z951 Presence of aortocoronary bypass graft: Secondary | ICD-10-CM | POA: Diagnosis not present

## 2021-07-01 DIAGNOSIS — R001 Bradycardia, unspecified: Secondary | ICD-10-CM | POA: Diagnosis present

## 2021-07-01 DIAGNOSIS — I25110 Atherosclerotic heart disease of native coronary artery with unstable angina pectoris: Secondary | ICD-10-CM | POA: Diagnosis not present

## 2021-07-01 DIAGNOSIS — I25700 Atherosclerosis of coronary artery bypass graft(s), unspecified, with unstable angina pectoris: Secondary | ICD-10-CM | POA: Diagnosis present

## 2021-07-01 DIAGNOSIS — F17210 Nicotine dependence, cigarettes, uncomplicated: Secondary | ICD-10-CM | POA: Insufficient documentation

## 2021-07-01 DIAGNOSIS — I200 Unstable angina: Secondary | ICD-10-CM | POA: Diagnosis present

## 2021-07-01 DIAGNOSIS — R0789 Other chest pain: Secondary | ICD-10-CM

## 2021-07-01 DIAGNOSIS — R079 Chest pain, unspecified: Secondary | ICD-10-CM | POA: Diagnosis not present

## 2021-07-01 DIAGNOSIS — E119 Type 2 diabetes mellitus without complications: Secondary | ICD-10-CM | POA: Diagnosis not present

## 2021-07-01 DIAGNOSIS — Z20822 Contact with and (suspected) exposure to covid-19: Secondary | ICD-10-CM | POA: Diagnosis not present

## 2021-07-01 DIAGNOSIS — E782 Mixed hyperlipidemia: Secondary | ICD-10-CM | POA: Diagnosis present

## 2021-07-01 DIAGNOSIS — J45909 Unspecified asthma, uncomplicated: Secondary | ICD-10-CM | POA: Insufficient documentation

## 2021-07-01 DIAGNOSIS — I10 Essential (primary) hypertension: Secondary | ICD-10-CM | POA: Diagnosis not present

## 2021-07-01 DIAGNOSIS — Z23 Encounter for immunization: Secondary | ICD-10-CM | POA: Insufficient documentation

## 2021-07-01 DIAGNOSIS — R072 Precordial pain: Secondary | ICD-10-CM | POA: Diagnosis present

## 2021-07-01 DIAGNOSIS — Z79899 Other long term (current) drug therapy: Secondary | ICD-10-CM | POA: Insufficient documentation

## 2021-07-01 LAB — TROPONIN I (HIGH SENSITIVITY)
Troponin I (High Sensitivity): 7 ng/L (ref <18)
Troponin I (High Sensitivity): 7 ng/L (ref <18)

## 2021-07-01 LAB — BASIC METABOLIC PANEL
Anion gap: 10 (ref 5–15)
BUN: 22 mg/dL — ABNORMAL HIGH (ref 6–20)
CO2: 26 mmol/L (ref 22–32)
Calcium: 9.4 mg/dL (ref 8.9–10.3)
Chloride: 98 mmol/L (ref 98–111)
Creatinine, Ser: 1.49 mg/dL — ABNORMAL HIGH (ref 0.61–1.24)
GFR, Estimated: 55 mL/min — ABNORMAL LOW (ref >60)
Glucose, Bld: 95 mg/dL (ref 70–99)
Potassium: 4.2 mmol/L (ref 3.5–5.1)
Sodium: 134 mmol/L — ABNORMAL LOW (ref 135–145)

## 2021-07-01 LAB — RESP PANEL BY RT-PCR (FLU A&B, COVID) ARPGX2
Influenza A by PCR: NEGATIVE
Influenza B by PCR: NEGATIVE
SARS Coronavirus 2 by RT PCR: NEGATIVE

## 2021-07-01 LAB — CBC
HCT: 39.4 % (ref 39.0–52.0)
Hemoglobin: 13.6 g/dL (ref 13.0–17.0)
MCH: 37.1 pg — ABNORMAL HIGH (ref 26.0–34.0)
MCHC: 34.5 g/dL (ref 30.0–36.0)
MCV: 107.4 fL — ABNORMAL HIGH (ref 80.0–100.0)
Platelets: 209 10*3/uL (ref 150–400)
RBC: 3.67 MIL/uL — ABNORMAL LOW (ref 4.22–5.81)
RDW: 13.7 % (ref 11.5–15.5)
WBC: 6.4 10*3/uL (ref 4.0–10.5)
nRBC: 0 % (ref 0.0–0.2)

## 2021-07-01 LAB — HEPARIN LEVEL (UNFRACTIONATED): Heparin Unfractionated: 0.19 IU/mL — ABNORMAL LOW (ref 0.30–0.70)

## 2021-07-01 MED ORDER — ASPIRIN EC 81 MG PO TBEC
81.0000 mg | DELAYED_RELEASE_TABLET | Freq: Every day | ORAL | Status: DC
  Administered 2021-07-01 – 2021-07-02 (×2): 81 mg via ORAL
  Filled 2021-07-01 (×2): qty 1

## 2021-07-01 MED ORDER — FUROSEMIDE 40 MG PO TABS
40.0000 mg | ORAL_TABLET | Freq: Every day | ORAL | Status: DC
  Administered 2021-07-02: 40 mg via ORAL
  Filled 2021-07-01: qty 1

## 2021-07-01 MED ORDER — HEPARIN (PORCINE) 25000 UT/250ML-% IV SOLN
1300.0000 [IU]/h | INTRAVENOUS | Status: DC
  Administered 2021-07-01: 1100 [IU]/h via INTRAVENOUS
  Administered 2021-07-02: 1250 [IU]/h via INTRAVENOUS
  Filled 2021-07-01 (×2): qty 250

## 2021-07-01 MED ORDER — RANOLAZINE ER 500 MG PO TB12
1000.0000 mg | ORAL_TABLET | Freq: Two times a day (BID) | ORAL | Status: DC
  Administered 2021-07-01 – 2021-07-02 (×2): 1000 mg via ORAL
  Filled 2021-07-01 (×2): qty 2

## 2021-07-01 MED ORDER — ACETAMINOPHEN 325 MG PO TABS: 650.0000 mg | ORAL_TABLET | ORAL | Status: DC | PRN

## 2021-07-01 MED ORDER — LISINOPRIL 5 MG PO TABS
2.5000 mg | ORAL_TABLET | Freq: Every day | ORAL | Status: DC
  Administered 2021-07-02: 2.5 mg via ORAL
  Filled 2021-07-01: qty 1

## 2021-07-01 MED ORDER — NITROGLYCERIN 0.4 MG SL SUBL: 0.4000 mg | SUBLINGUAL_TABLET | SUBLINGUAL | Status: DC | PRN

## 2021-07-01 MED ORDER — CARVEDILOL 3.125 MG PO TABS
3.1250 mg | ORAL_TABLET | Freq: Two times a day (BID) | ORAL | Status: DC
  Administered 2021-07-01 – 2021-07-02 (×2): 3.125 mg via ORAL
  Filled 2021-07-01 (×2): qty 1

## 2021-07-01 MED ORDER — FLUTICASONE PROPIONATE HFA 220 MCG/ACT IN AERO: 1.0000 | INHALATION_SPRAY | Freq: Every day | RESPIRATORY_TRACT | Status: DC

## 2021-07-01 MED ORDER — ISOSORBIDE MONONITRATE ER 60 MG PO TB24
60.0000 mg | ORAL_TABLET | Freq: Two times a day (BID) | ORAL | Status: DC
  Administered 2021-07-01 – 2021-07-02 (×2): 60 mg via ORAL
  Filled 2021-07-01 (×2): qty 1

## 2021-07-01 MED ORDER — INFLUENZA VAC SPLIT QUAD 0.5 ML IM SUSY
0.5000 mL | PREFILLED_SYRINGE | INTRAMUSCULAR | Status: AC
  Administered 2021-07-02: 0.5 mL via INTRAMUSCULAR
  Filled 2021-07-01: qty 0.5

## 2021-07-01 MED ORDER — HEPARIN BOLUS VIA INFUSION
4000.0000 [IU] | Freq: Once | INTRAVENOUS | Status: AC
  Administered 2021-07-01: 4000 [IU] via INTRAVENOUS

## 2021-07-01 MED ORDER — BUDESONIDE 0.5 MG/2ML IN SUSP
0.5000 mg | Freq: Two times a day (BID) | RESPIRATORY_TRACT | Status: DC
  Administered 2021-07-02: 0.5 mg via RESPIRATORY_TRACT
  Filled 2021-07-01 (×2): qty 2

## 2021-07-01 MED ORDER — PRAVASTATIN SODIUM 40 MG PO TABS
40.0000 mg | ORAL_TABLET | Freq: Every day | ORAL | Status: DC
  Administered 2021-07-01 – 2021-07-02 (×2): 40 mg via ORAL
  Filled 2021-07-01 (×2): qty 1

## 2021-07-01 MED ORDER — DIAZEPAM 5 MG PO TABS
10.0000 mg | ORAL_TABLET | Freq: Two times a day (BID) | ORAL | Status: DC
  Administered 2021-07-01 – 2021-07-02 (×2): 10 mg via ORAL
  Filled 2021-07-01 (×2): qty 2

## 2021-07-01 MED ORDER — ONDANSETRON HCL 4 MG/2ML IJ SOLN: 4.0000 mg | Freq: Four times a day (QID) | INTRAMUSCULAR | Status: DC | PRN

## 2021-07-01 MED ORDER — CLOPIDOGREL BISULFATE 75 MG PO TABS
75.0000 mg | ORAL_TABLET | Freq: Every day | ORAL | Status: DC
  Administered 2021-07-02: 75 mg via ORAL
  Filled 2021-07-01: qty 1

## 2021-07-01 MED ORDER — PANTOPRAZOLE SODIUM 40 MG PO TBEC
40.0000 mg | DELAYED_RELEASE_TABLET | Freq: Every day | ORAL | Status: DC
  Administered 2021-07-02: 40 mg via ORAL
  Filled 2021-07-01: qty 1

## 2021-07-01 NOTE — ED Notes (Signed)
Report given to [facility] with [facility].

## 2021-07-01 NOTE — ED Notes (Signed)
Attempted report to 3E with no answer from nurse.

## 2021-07-01 NOTE — ED Provider Notes (Addendum)
[facility] EMERGENCY DEPARTMENT Provider Note   CSN: [id number] Arrival date & time: 07/01/21  1204     History Chief Complaint  Patient presents with   Chest Pain    [name] [name] is a 55 y.o. male.  The history is provided by the patient. No language interpreter was used.  Chest Pain Pain location:  Substernal area Pain quality: aching and stabbing   Pain radiates to:  R arm Pain severity:  Severe Onset quality:  Sudden Duration:  1 day Timing:  Intermittent Progression:  Resolved Context: not breathing and not movement   Relieved by:  Nothing Worsened by:  Nothing Ineffective treatments:  None tried Risk factors: coronary artery disease and diabetes mellitus   Pt reports pain in chest yesterday that felt like reflux.  Pt reports pain today in chest that radiated to right arm.  Pt reports [name] [name] has had heart disease and angina.  Pt reports pain improved after 2 nitorglycerin.  Pt has had 2 episodes since    Past Medical History:  Diagnosis Date   Asthma    Bipolar disorder ([place])    Chronic back pain    Coronary atherosclerosis of native coronary artery    a. CABG 2003. b. NSTEMI 2010 s/p BMS to SVG-RCA c. PTCA/stent/asp thromb to SVG-RCA 01/2010, PTCA to SVG-RCA 04/2010 d. known occlusion of SVG-RCA 2013. e. NSTEMI 07/2013  s/p PTCA/DES to SVG-diagonal/intermediate/OM1.    Essential hypertension    GERD (gastroesophageal reflux disease)    Gout    Ischemic cardiomyopathy    a. EF 35-40% by cath 07/2013.   Mixed hyperlipidemia    Sinus bradycardia    Type 2 diabetes mellitus [facility])     Patient Active Problem List   Diagnosis Date Noted   Unstable angina ([place]) 08/19/2020   DM type 2 (diabetes mellitus, type 2) ([place]) 08/19/2020   Hypotension due to drugs 11/22/2015   Accelerating angina ([place]) 01/28/2015   CAD S/P percutaneous coronary angioplasty - PCI of SVG-OM 01/28/2015   Polysubstance abuse ([place]) 01/28/2015   Coronary artery disease involving coronary  bypass graft of native heart with unstable angina pectoris ([place]) 01/28/2015   Exertional angina ([place]) 01/16/2015   Chest pain 01/16/2015   Coronary atherosclerosis of native coronary artery 06/20/2014   Cardiomyopathy, ischemic 07/15/2013   Essential hypertension, benign 12/05/2012   Sinus bradycardia 12/05/2012   TOBACCO USER 08/10/2009   Mixed hyperlipidemia 06/17/2009    Past Surgical History:  Procedure Laterality Date   CARDIAC CATHETERIZATION  06/19/2014   Procedure: CORONARY STENT INTERVENTION;  Surgeon: Leonie Man, MD;  Location: [facility] CATH LAB;  Service: Cardiovascular;;  DES to seg SVG Diag/OM/[place]    CARDIAC CATHETERIZATION N/A 11/22/2015   Procedure: Left Heart Cath and Coronary Angiography;  Surgeon: Belva Crome, MD;  Location: [place] CV LAB;  Service: Cardiovascular;  Laterality: N/A;   CARDIAC CATHETERIZATION  08/19/2020   CHOLECYSTECTOMY     CORONARY ARTERY BYPASS GRAFT     2003, LIMA to LAD; SVG to diagonal; SVG to OM, ramus, and diagonal; SVG to RCA   LEFT HEART CATH AND CORS/GRAFTS ANGIOGRAPHY N/A 08/19/2020   Procedure: LEFT HEART CATH AND CORS/GRAFTS ANGIOGRAPHY;  Surgeon: Nelva Bush, MD;  Location: [place] CV LAB;  Service: Cardiovascular;  Laterality: N/A;   LEFT HEART CATHETERIZATION WITH CORONARY ANGIOGRAM N/A 01/28/2015   Procedure: LEFT HEART CATHETERIZATION WITH CORONARY ANGIOGRAM;  Surgeon: Leonie Man, MD;  Location: [facility] CATH LAB;  Service: Cardiovascular;  Laterality:  N/A;   LEFT HEART CATHETERIZATION WITH CORONARY/GRAFT ANGIOGRAM N/A 07/14/2013   Procedure: LEFT HEART CATHETERIZATION WITH Beatrix Fetters;  Surgeon: Burnell Blanks, MD;  Location: [facility] CATH LAB;  Service: Cardiovascular;  Laterality: N/A;   LEFT HEART CATHETERIZATION WITH CORONARY/GRAFT ANGIOGRAM N/A 12/29/2013   Procedure: LEFT HEART CATHETERIZATION WITH Beatrix Fetters;  Surgeon: Leonie Man, MD;  Location: [facility] CATH LAB;  Service:  Cardiovascular;  Laterality: N/A;   LEFT HEART CATHETERIZATION WITH CORONARY/GRAFT ANGIOGRAM N/A 06/19/2014   Procedure: LEFT HEART CATHETERIZATION WITH Beatrix Fetters;  Surgeon: Leonie Man, MD;  Location: [facility] CATH LAB;  Service: Cardiovascular;  Laterality: N/A;   Metal plate to chin     PERCUTANEOUS CORONARY STENT INTERVENTION (PCI-S)  07/14/2013   Procedure: PERCUTANEOUS CORONARY STENT INTERVENTION (PCI-S);  Surgeon: Burnell Blanks, MD;  Location: [facility] CATH LAB;  Service: Cardiovascular;;   Pin placement left leg         Family History  Problem Relation Age of Onset   Stroke Other        family Hx    Bowel Disease Mother    Hernia Father    Pneumonia Daughter     Social History   Tobacco Use   Smoking status: Every Day    Packs/day: 0.80    Years: 33.00    Pack years: 26.40    Types: Cigarettes    Start date: 07/01/1978   Smokeless tobacco: Never   Tobacco comments:    trying to quit, 3/4 pack per day. Started the nicotine patch on 01-10-14-   Vaping Use   Vaping Use: Never used  Substance Use Topics   Alcohol use: Not Currently    Comment: drink a can of beer occasionally every year    Drug use: Yes    Frequency: 2.0 times per week    Types: Marijuana    Home Medications Prior to Admission medications   Medication Sig Start Date End Date Taking? Authorizing Provider  acetaminophen (TYLENOL) 325 MG tablet Take 2 tablets (650 mg total) by mouth every 4 (four) hours as needed for headache or mild pain. 06/20/14  Yes Kilroy, Luke K, PA-C  allopurinol (ZYLOPRIM) 300 MG tablet Take 300 mg by mouth daily. 11/03/15  Yes [provider]  aspirin EC 81 MG tablet Take 1 tablet (81 mg total) by mouth daily. 12/05/12  Yes Serpe, Burna Forts, PA-C  carvedilol (COREG) 3.125 MG tablet TAKE ONE TABLET BY MOUTH TWICE DAILY WITH MEALS Patient taking differently: Take 3.125 mg by mouth 2 (two) times daily with a meal. 05/17/21  Yes Satira Sark, MD  clopidogrel  (PLAVIX) 75 MG tablet TAKE ONE TABLET BY MOUTH DAILY Patient taking differently: Take 75 mg by mouth daily. 03/30/21  Yes Satira Sark, MD  diazepam (VALIUM) 10 MG tablet Take 10 mg by mouth 2 (two) times daily.   Yes [provider]  FLOVENT HFA 220 MCG/ACT inhaler Inhale 1 puff into the lungs daily.  10/10/17  Yes [provider]  furosemide (LASIX) 40 MG tablet TAKE ONE TABLET BY MOUTH DAILY Patient taking differently: Take 40 mg by mouth daily. 06/27/21  Yes Verta Ellen., NP  isosorbide mononitrate (IMDUR) 60 MG 24 hr tablet TAKE ONE TABLET BY MOUTH TWICE DAILY Patient taking differently: Take 60 mg by mouth in the morning and at bedtime. 06/27/21  Yes Satira Sark, MD  lisinopril (ZESTRIL) 2.5 MG tablet TAKE ONE TABLET BY MOUTH EVERY DAY Patient taking differently:  Take 2.5 mg by mouth daily. 05/17/21  Yes Satira Sark, MD  metFORMIN (GLUCOPHAGE) 1000 MG tablet Take 1,000 mg by mouth daily with breakfast.   Yes [provider]  omega-3 acid ethyl esters (LOVAZA) 1 g capsule Take 1 capsule by mouth every morning. & 2 capsules at night 08/18/19  Yes [provider]  pantoprazole (PROTONIX) 40 MG tablet Take 40 mg by mouth daily.    Yes [provider]  pravastatin (PRAVACHOL) 80 MG tablet TAKE ONE TABLET BY MOUTH EVERY EVENING Patient taking differently: Take 40 mg by mouth daily. 03/30/21  Yes Satira Sark, MD  ranolazine (RANEXA) 1000 MG SR tablet TAKE ONE TABLET BY MOUTH 2 TIMES A DAY Patient taking differently: Take 1,000 mg by mouth 2 (two) times daily. 03/30/21  Yes Satira Sark, MD  Blood Pressure Monitoring KIT 1 each by Does not apply route as directed. 09/29/14   Satira Sark, MD  nitroGLYCERIN (NITROSTAT) 0.4 MG SL tablet PLACE 1 TABLET UNDER THE TONGUE AS NEEDED FOR CHEST PAIN. MAY REPEAT EVERY 5 MINUTES UP TO 3 DOSES. IF NO RELIEF AFTER 3 DOSES, CALL 911 OR Patient taking differently: Place 0.4 mg under  the tongue every 5 (five) minutes as needed for chest pain. 02/12/19   Satira Sark, MD  [facility] VERIO test strip  10/10/17   [provider]    Allergies    Patient has no known allergies.  Review of Systems   Review of Systems  Cardiovascular:  Positive for chest pain.  All other systems reviewed and are negative.  Physical Exam Updated Vital Signs BP 99/74   Pulse (!) 46   Temp 98.1 F (36.7 C) (Oral)   Resp 12   Ht 5' 4"  (1.626 m)   Wt 93.9 kg   SpO2 99%   BMI 35.53 kg/m   Physical Exam Vitals and nursing note reviewed.  Constitutional:      Appearance: [name] [name] is well-developed.  HENT:     Head: Normocephalic.  Cardiovascular:     Rate and Rhythm: Normal rate and regular rhythm.     Heart sounds: Normal heart sounds.  Pulmonary:     Effort: Pulmonary effort is normal.  Chest:     Chest wall: No tenderness.  Abdominal:     General: There is no distension.     Palpations: Abdomen is soft.  Musculoskeletal:        General: Normal range of motion.     Cervical back: Normal range of motion.  Neurological:     Mental Status: [name] [name] is alert and oriented to person, place, and time.    ED Results / Procedures / Treatments   Labs (all labs ordered are listed, but only abnormal results are displayed) Labs Reviewed  BASIC METABOLIC PANEL - Abnormal; Notable for the following components:      Result Value   Sodium 134 (*)    BUN 22 (*)    Creatinine, Ser 1.49 (*)    GFR, Estimated 55 (*)    All other components within normal limits  CBC - Abnormal; Notable for the following components:   RBC 3.67 (*)    MCV 107.4 (*)    MCH 37.1 (*)    All other components within normal limits  TROPONIN I (HIGH SENSITIVITY)  TROPONIN I (HIGH SENSITIVITY)    EKG EKG Interpretation  Date/Time:  Friday July 01 2021 13:48:19 EDT Ventricular Rate:  47 PR Interval:  189 QRS Duration:  80 QT Interval:  472 QTC Calculation: 418 R Axis:   73 Text  Interpretation: Sinus bradycardia Probable inferior infarct, old Lateral leads are also involved Confirmed by Fredia Sorrow [phone]) on 07/01/2021 1:50:51 PM  Radiology DG Chest 2 View  Result Date: 07/01/2021 CLINICAL DATA:  Chest pain. EXAM: CHEST - 2 VIEW COMPARISON:  August 19, 2020. FINDINGS: The heart size and mediastinal contours are within normal limits. Median sternotomy and CABG. Both lungs are clear. No visible pleural effusions or pneumothorax. No acute osseous abnormality. Osteopenia. Degenerative changes of the thoracic spine. IMPRESSION: No evidence of acute cardiopulmonary disease. Electronically Signed   By: Margaretha Sheffield M.D.   On: 07/01/2021 13:19    Procedures Procedures   Medications Ordered in ED Medications - No data to display  ED Course  I have reviewed the triage vital signs and the nursing notes.  Pertinent labs & imaging results that were available during my care of the patient were reviewed by me and considered in my medical decision making (see chart for details).    MDM Rules/Calculators/A&P                           MDM:  Cath from 11/21 reviewed  Pt has severe Coronary artery disease. I spoke to Cardiology Dr. Harl Bowie who will see and evaluate pt here.  Pt to be transferred to [facility] for observation  Final Clinical Impression(s) / ED Diagnoses Final diagnoses:  Chest pain, unspecified type    Rx / DC Orders ED Discharge Orders     None        Sidney Ace 07/01/21 2157    Sidney Ace 07/01/21 2158    Fredia Sorrow, MD 07/11/21 1303

## 2021-07-01 NOTE — H&P (Signed)
Cardiology Admission History and Physical:   Patient ID: [name] [name] MRN: [id number]; DOB: 1966/09/23   Admission date: 07/01/2021  PCP:  [name], MD   [facility] Providers Cardiologist:  [name], MD   {    Chief Complaint:  Chest pain  Patient Profile:   [name] [name] is a 55 y.o. male with CAD who is being seen 07/01/2021 for the evaluation of chest pain.  History of Present Illness:   [name] [name] 65 to male history of CAD with CABG in 2003, subsequent MBS to SVG-RCA, in 01/2010 stent to SVG-RCA, 04/2010 PTCA SVG-RCA, DES to SVG-diag, HL, sinus brady, DM2, bipolar presents with chest pain.   History of chronic chest pains. Cath 2016, 2017, 2021 stable disease as reported below.    Reports episode of chest pain Tuesday night while laying in bed. Burning feeling throughout entire chest. Not better with pepto. Lasted all night long into Wed morning. Could be worst with deep breathing.   Wed night had a different type of chest pain. More of a sharp pain midchest, took NG right away and resolved within a few minutes. No other associated symptoms  Recurrent chest pain Friday morning. Sharp pain midchest this time more intense, 8/10 in severity midchest. Took NG with some initial relief but the reoccurred and progressed.    K 4.2 Cr 1.49 BUN 22 WBC 6.4 Hgb 13.6 Plt 209 Trop 7--> EKG sinus brady, nonspecific ST/T changes chronic   2017 cath: occluded LAD, high grade obstruction LCX, occluded RCA with distal filling by collaterals. Patent LIMA-LAD, SVG-RCA occluded, sequential SVG-diag and ramus diseased with occluded ramus portion Stable compared to 2016 cath  08/2020 cath: Severe native coronary artery disease, including 60% LMCA disease, chronic total occlusions of ostial LAD and mid RCA, and 80% proximal ramus intermedius stenosis.Patent LIMA-LAD, patent SVG-D1 with mild to mod ISR, chronically occluded SVG-RCA. Stable compared to 11/2015 cath.    Past  Medical History:  Diagnosis Date   Asthma    Bipolar disorder ([place])    Chronic back pain    Coronary atherosclerosis of native coronary artery    a. CABG 2003. b. NSTEMI 2010 s/p BMS to SVG-RCA c. PTCA/stent/asp thromb to SVG-RCA 01/2010, PTCA to SVG-RCA 04/2010 d. known occlusion of SVG-RCA 2013. e. NSTEMI 07/2013  s/p PTCA/DES to SVG-diagonal/intermediate/OM1.    Essential hypertension    GERD (gastroesophageal reflux disease)    Gout    Ischemic cardiomyopathy    a. EF 35-40% by cath 07/2013.   Mixed hyperlipidemia    Sinus bradycardia    Type 2 diabetes mellitus [facility])     Past Surgical History:  Procedure Laterality Date   CARDIAC CATHETERIZATION  06/19/2014   Procedure: CORONARY STENT INTERVENTION;  Surgeon: Leonie Man, MD;  Location: [facility] CATH LAB;  Service: Cardiovascular;;  DES to seg SVG Diag/OM/Ramus    CARDIAC CATHETERIZATION N/A 11/22/2015   Procedure: Left Heart Cath and Coronary Angiography;  Surgeon: Belva Crome, MD;  Location: [place] CV LAB;  Service: Cardiovascular;  Laterality: N/A;   CARDIAC CATHETERIZATION  08/19/2020   CHOLECYSTECTOMY     CORONARY ARTERY BYPASS GRAFT     2003, LIMA to LAD; SVG to diagonal; SVG to OM, ramus, and diagonal; SVG to RCA   LEFT HEART CATH AND CORS/GRAFTS ANGIOGRAPHY N/A 08/19/2020   Procedure: LEFT HEART CATH AND CORS/GRAFTS ANGIOGRAPHY;  Surgeon: Nelva Bush, MD;  Location: [place] CV LAB;  Service: Cardiovascular;  Laterality: N/A;  LEFT HEART CATHETERIZATION WITH CORONARY ANGIOGRAM N/A 01/28/2015   Procedure: LEFT HEART CATHETERIZATION WITH CORONARY ANGIOGRAM;  Surgeon: Leonie Man, MD;  Location: [facility] CATH LAB;  Service: Cardiovascular;  Laterality: N/A;   LEFT HEART CATHETERIZATION WITH CORONARY/GRAFT ANGIOGRAM N/A 07/14/2013   Procedure: LEFT HEART CATHETERIZATION WITH Beatrix Fetters;  Surgeon: Burnell Blanks, MD;  Location: [facility] CATH LAB;  Service: Cardiovascular;  Laterality: N/A;   LEFT HEART  CATHETERIZATION WITH CORONARY/GRAFT ANGIOGRAM N/A 12/29/2013   Procedure: LEFT HEART CATHETERIZATION WITH Beatrix Fetters;  Surgeon: Leonie Man, MD;  Location: [facility] CATH LAB;  Service: Cardiovascular;  Laterality: N/A;   LEFT HEART CATHETERIZATION WITH CORONARY/GRAFT ANGIOGRAM N/A 06/19/2014   Procedure: LEFT HEART CATHETERIZATION WITH Beatrix Fetters;  Surgeon: Leonie Man, MD;  Location: [facility] CATH LAB;  Service: Cardiovascular;  Laterality: N/A;   Metal plate to chin     PERCUTANEOUS CORONARY STENT INTERVENTION (PCI-S)  07/14/2013   Procedure: PERCUTANEOUS CORONARY STENT INTERVENTION (PCI-S);  Surgeon: Burnell Blanks, MD;  Location: [facility] CATH LAB;  Service: Cardiovascular;;   Pin placement left leg       Medications Prior to Admission: Prior to Admission medications   Medication Sig Start Date End Date Taking? Authorizing Provider  acetaminophen (TYLENOL) 325 MG tablet Take 2 tablets (650 mg total) by mouth every 4 (four) hours as needed for headache or mild pain. 06/20/14  Yes Kilroy, Luke K, PA-C  allopurinol (ZYLOPRIM) 300 MG tablet Take 300 mg by mouth daily. 11/03/15  Yes [provider]  aspirin EC 81 MG tablet Take 1 tablet (81 mg total) by mouth daily. 12/05/12  Yes Serpe, Burna Forts, PA-C  carvedilol (COREG) 3.125 MG tablet TAKE ONE TABLET BY MOUTH TWICE DAILY WITH MEALS Patient taking differently: Take 3.125 mg by mouth 2 (two) times daily with a meal. 05/17/21  Yes Satira Sark, MD  clopidogrel (PLAVIX) 75 MG tablet TAKE ONE TABLET BY MOUTH DAILY Patient taking differently: Take 75 mg by mouth daily. 03/30/21  Yes Satira Sark, MD  diazepam (VALIUM) 10 MG tablet Take 10 mg by mouth 2 (two) times daily.   Yes [provider]  FLOVENT HFA 220 MCG/ACT inhaler Inhale 1 puff into the lungs daily.  10/10/17  Yes [provider]  furosemide (LASIX) 40 MG tablet TAKE ONE TABLET BY MOUTH DAILY Patient taking differently: Take 40 mg by  mouth daily. 06/27/21  Yes Verta Ellen., NP  isosorbide mononitrate (IMDUR) 60 MG 24 hr tablet TAKE ONE TABLET BY MOUTH TWICE DAILY Patient taking differently: Take 60 mg by mouth in the morning and at bedtime. 06/27/21  Yes Satira Sark, MD  lisinopril (ZESTRIL) 2.5 MG tablet TAKE ONE TABLET BY MOUTH EVERY DAY Patient taking differently: Take 2.5 mg by mouth daily. 05/17/21  Yes Satira Sark, MD  metFORMIN (GLUCOPHAGE) 1000 MG tablet Take 1,000 mg by mouth daily with breakfast.   Yes [provider]  omega-3 acid ethyl esters (LOVAZA) 1 g capsule Take 1 capsule by mouth every morning. & 2 capsules at night 08/18/19  Yes [provider]  pantoprazole (PROTONIX) 40 MG tablet Take 40 mg by mouth daily.    Yes [provider]  pravastatin (PRAVACHOL) 80 MG tablet TAKE ONE TABLET BY MOUTH EVERY EVENING Patient taking differently: Take 40 mg by mouth daily. 03/30/21  Yes Satira Sark, MD  ranolazine (RANEXA) 1000 MG SR tablet TAKE ONE TABLET BY MOUTH 2 TIMES A DAY Patient taking  differently: Take 1,000 mg by mouth 2 (two) times daily. 03/30/21  Yes Satira Sark, MD  Blood Pressure Monitoring KIT 1 each by Does not apply route as directed. 09/29/14   Satira Sark, MD  nitroGLYCERIN (NITROSTAT) 0.4 MG SL tablet PLACE 1 TABLET UNDER THE TONGUE AS NEEDED FOR CHEST PAIN. MAY REPEAT EVERY 5 MINUTES UP TO 3 DOSES. IF NO RELIEF AFTER 3 DOSES, CALL 911 OR Patient taking differently: Place 0.4 mg under the tongue every 5 (five) minutes as needed for chest pain. 02/12/19   Satira Sark, MD  [facility] VERIO test strip  10/10/17   [provider]     Allergies:   No Known Allergies  Social History:   Social History   Socioeconomic History   Marital status: Married    Spouse name: Not on file   Number of children: Not on file   Years of education: Not on file   Highest education level: Not on file  Occupational History   Not on file   Tobacco Use   Smoking status: Every Day    Packs/day: 0.80    Years: 33.00    Pack years: 26.40    Types: Cigarettes    Start date: 07/01/1978   Smokeless tobacco: Never   Tobacco comments:    trying to quit, 3/4 pack per day. Started the nicotine patch on 01-10-14-   Vaping Use   Vaping Use: Never used  Substance and Sexual Activity   Alcohol use: Not Currently    Comment: drink a can of beer occasionally every year    Drug use: Yes    Frequency: 2.0 times per week    Types: Marijuana   Sexual activity: Yes    Partners: Female  Other Topics Concern   Not on file  Social History Narrative   Married.    Social Determinants of Health   Financial Resource Strain: Not on file  Food Insecurity: Not on file  Transportation Needs: Not on file  Physical Activity: Not on file  Stress: Not on file  Social Connections: Not on file  Intimate Partner Violence: Not on file    Family History:   The patient's family history includes Bowel Disease in his mother; Hernia in his father; Pneumonia in his daughter; Stroke in an other family member.    ROS:  Please see the history of present illness.  All other ROS reviewed and negative.     Physical Exam/Data:   Vitals:   07/01/21 1221 07/01/21 1315 07/01/21 1330 07/01/21 1447  BP:   99/74 104/71  Pulse:   (!) 46 (!) 48  Resp:   12 16  Temp: 98.1 F (36.7 C)   97.6 F (36.4 C)  TempSrc: Oral   Oral  SpO2:   99% 97%  Weight:  93.9 kg    Height:  5' 4"  (1.626 m)     No intake or output data in the 24 hours ending 07/01/21 1458 Last 3 Weights 07/01/2021 07/01/2021 04/15/2021  Weight (lbs) 207 lb 207 lb 208 lb 11.2 oz  Weight (kg) 93.895 kg 93.895 kg 94.666 kg     Body mass index is 35.53 kg/m.  General:  Well nourished, well developed, in no acute distress HEENT: normal Neck: no JVD Vascular: No carotid bruits; Distal pulses 2+ bilaterally   Cardiac:  normal S1, S2; RRR; no murmur  Lungs:  clear to auscultation bilaterally, no  wheezing, rhonchi or rales  Abd: soft, nontender, no hepatomegaly  Ext: no edema Musculoskeletal:  No deformities, BUE and BLE strength normal and equal Skin: warm and dry  Neuro:  CNs 2-12 intact, no focal abnormalities noted Psych:  Normal affect     Laboratory Data:  High Sensitivity Troponin:   Recent Labs  Lab 07/01/21 1225 07/01/21 1409  TROPONINIHS 7 7      Chemistry Recent Labs  Lab 07/01/21 1225  NA 134*  K 4.2  CL 98  CO2 26  GLUCOSE 95  BUN 22*  CREATININE 1.49*  CALCIUM 9.4  GFRNONAA 55*  ANIONGAP 10    No results for input(s): PROT, ALBUMIN, AST, ALT, ALKPHOS, BILITOT in the last 168 hours. Lipids No results for input(s): CHOL, TRIG, HDL, LABVLDL, LDLCALC, CHOLHDL in the last 168 hours. Hematology Recent Labs  Lab 07/01/21 1225  WBC 6.4  RBC 3.67*  HGB 13.6  HCT 39.4  MCV 107.4*  MCH 37.1*  MCHC 34.5  RDW 13.7  PLT 209   Thyroid No results for input(s): TSH, FREET4 in the last 168 hours. BNPNo results for input(s): BNP, PROBNP in the last 168 hours.  DDimer No results for input(s): DDIMER in the last 168 hours.   Radiology/Studies:  DG Chest 2 View  Result Date: 07/01/2021 CLINICAL DATA:  Chest pain. EXAM: CHEST - 2 VIEW COMPARISON:  August 19, 2020. FINDINGS: The heart size and mediastinal contours are within normal limits. Median sternotomy and CABG. Both lungs are clear. No visible pleural effusions or pneumothorax. No acute osseous abnormality. Osteopenia. Degenerative changes of the thoracic spine. IMPRESSION: No evidence of acute cardiopulmonary disease. Electronically Signed   By: Margaretha Sheffield M.D.   On: 07/01/2021 13:19     Assessment and Plan:   1.CAD/chest pain - extensive history of CAD as reported above with prior CABG and stents - long history of chest pain with 2016,2017,2021 caths with essentially stable disease and no PCI targets - difficult assessment given recurrent chest pain symptosm over the years with stable  cath finding in patient with history of such extensive disease. Admitted with different types of chest pain, some episodes atypical some more typical. Varying response to NG.   Ideally would see some objective evidence of ischemia to consider repeat cath. Would cycle trops, check echo, and monitor symptoms. Given his complex history and complexity of decision making for this patient would plan to transfter to [facility] for ongoing cardiology assessment since cardiology not at [facility] over the weekend.   -continue ASA 81, coreg 3.110m bid, imdur 626mbid, prava 8080maily, ranexa 1000m42md. With ongoing workup for possible ACS would start heparin for now.     Risk Assessment/Risk Scores:   HEAR Score (for undifferentiated chest pain):   {    Severity of Illness: The appropriate patient status for this patient is OBSERVATION. Observation status is judged to be reasonable and necessary in order to provide the required intensity of service to ensure the patient's safety. The patient's presenting symptoms, physical exam findings, and initial radiographic and laboratory data in the context of their medical condition is felt to place them at decreased risk for further clinical deterioration. Furthermore, it is anticipated that the patient will be medically stable for discharge from the hospital within 2 midnights of admission. The following factors support the patient status of observation.   " The patient's presenting symptoms include chest pain. " The physical exam findings include normal cardiac examn. " The initial radiographic and laboratory data are negative cardiac enzymes thus  far..   For questions or updates, please contact [place] Please consult [email or web address] for contact info under     Signed, Carlyle Dolly, MD  07/01/2021 2:58 PM

## 2021-07-01 NOTE — ED Triage Notes (Signed)
Pt to er room number 19, pt states that he is here for chest pain that started three days ago, states that he thought that it was heart burn, states that he has take a couple of ntg and they have given him temporary relief.

## 2021-07-01 NOTE — Telephone Encounter (Signed)
   Pt c/o of Chest Pain: STAT if CP now or developed within 24 hours  1. Are you having CP right now?   YES  2. Are you experiencing any other symptoms (ex. SOB, nausea, vomiting, sweating)?   Not at this time- woke up in a sweat yesterday morning  3. How long have you been experiencing CP?   Tuesday 06/28/2021  4. Is your CP continuous or coming and going?    Continuous   5. Have you taken Nitroglycerin?  Yes, woke up with pain on Wednesday and has now taken 2 more  today.  ? Per phone call from pt- since Tuesday he's been having chest pain dead in the center of his chest. States "it feels like his heart is on fire"

## 2021-07-01 NOTE — Progress Notes (Signed)
ANTICOAGULATION CONSULT NOTE - Initial Consult  Pharmacy Consult for heparin Indication: chest pain/ACS  No Known Allergies  Patient Measurements: Height: 5\' 4"  (162.6 cm) Weight: 93.9 kg (207 lb) IBW/kg (Calculated) : 59.2 Heparin Dosing Weight: 80 kg  Vital Signs: Temp: 97.6 F (36.4 C) (09/23 1447) Temp Source: Oral (09/23 1447) BP: 112/79 (09/23 1602) Pulse Rate: 55 (09/23 1602)  Labs: Recent Labs    07/01/21 1225 07/01/21 1409  HGB 13.6  --   HCT 39.4  --   PLT 209  --   CREATININE 1.49*  --   TROPONINIHS 7 7    Estimated Creatinine Clearance: 58.6 mL/min (A) (by C-G formula based on SCr of 1.49 mg/dL (H)).   Medical History: Past Medical History:  Diagnosis Date   Asthma    Bipolar disorder ([facility])    Chronic back pain    Coronary atherosclerosis of native coronary artery    a. CABG 2003. b. NSTEMI 2010 s/p BMS to SVG-RCA c. PTCA/stent/asp thromb to SVG-RCA 01/2010, PTCA to SVG-RCA 04/2010 d. known occlusion of SVG-RCA 2013. e. NSTEMI 07/2013  s/p PTCA/DES to SVG-diagonal/intermediate/OM1.    Essential hypertension    GERD (gastroesophageal reflux disease)    Gout    Ischemic cardiomyopathy    a. EF 35-40% by cath 07/2013.   Mixed hyperlipidemia    Sinus bradycardia    Type 2 diabetes mellitus ([facility])     Medications:  (Not in a hospital admission)   Assessment: Pharmacy consulted to dose heparin in patient with chest pain/ACS.  Patient is not on anticoagulation prior to admission.   CBC WNL  Goal of Therapy:  Heparin level 0.3-0.7 units/ml Monitor platelets by anticoagulation protocol: Yes   Plan:  Give 4000 units bolus x 1 Start heparin infusion at 1100 units/hr Check anti-Xa level in 6 hours and daily while on heparin Continue to monitor H&H and platelets  08/2013, PharmD Clinical Pharmacist 07/01/2021 4:16 PM

## 2021-07-01 NOTE — Progress Notes (Signed)
Pt has arrived to unit. Pt will inform family.

## 2021-07-01 NOTE — Telephone Encounter (Signed)
Spoke with pt who states on Tuesday night he was awaken with chest pain and he took Pepto with no relief. Pt states that the same this happened on Wednesday night. Pt did wake up in and a sweat on yesterday. States that the pain has been on and off. He does c/o nausea at times. Pt woke up today with chest pain and took 2 nitro with no relief. Instructed pt to be seen in the ER now. Pt voiced understanding and states that he will go to [facility] ED.

## 2021-07-02 ENCOUNTER — Observation Stay (HOSPITAL_BASED_OUTPATIENT_CLINIC_OR_DEPARTMENT_OTHER): Payer: Medicare Other

## 2021-07-02 DIAGNOSIS — Z20822 Contact with and (suspected) exposure to covid-19: Secondary | ICD-10-CM | POA: Diagnosis not present

## 2021-07-02 DIAGNOSIS — R0789 Other chest pain: Secondary | ICD-10-CM | POA: Diagnosis not present

## 2021-07-02 DIAGNOSIS — E119 Type 2 diabetes mellitus without complications: Secondary | ICD-10-CM | POA: Diagnosis not present

## 2021-07-02 DIAGNOSIS — R079 Chest pain, unspecified: Secondary | ICD-10-CM

## 2021-07-02 DIAGNOSIS — J45909 Unspecified asthma, uncomplicated: Secondary | ICD-10-CM | POA: Diagnosis not present

## 2021-07-02 DIAGNOSIS — Z7984 Long term (current) use of oral hypoglycemic drugs: Secondary | ICD-10-CM | POA: Diagnosis not present

## 2021-07-02 DIAGNOSIS — Z951 Presence of aortocoronary bypass graft: Secondary | ICD-10-CM | POA: Diagnosis not present

## 2021-07-02 DIAGNOSIS — Z79899 Other long term (current) drug therapy: Secondary | ICD-10-CM | POA: Diagnosis not present

## 2021-07-02 DIAGNOSIS — Z23 Encounter for immunization: Secondary | ICD-10-CM | POA: Diagnosis not present

## 2021-07-02 DIAGNOSIS — I25110 Atherosclerotic heart disease of native coronary artery with unstable angina pectoris: Secondary | ICD-10-CM | POA: Diagnosis not present

## 2021-07-02 DIAGNOSIS — I10 Essential (primary) hypertension: Secondary | ICD-10-CM | POA: Diagnosis not present

## 2021-07-02 DIAGNOSIS — F17210 Nicotine dependence, cigarettes, uncomplicated: Secondary | ICD-10-CM | POA: Diagnosis not present

## 2021-07-02 LAB — CBC
HCT: 35.5 % — ABNORMAL LOW (ref 39.0–52.0)
HCT: 35.6 % — ABNORMAL LOW (ref 39.0–52.0)
Hemoglobin: 12.6 g/dL — ABNORMAL LOW (ref 13.0–17.0)
Hemoglobin: 12.3 g/dL — ABNORMAL LOW (ref 13.0–17.0)
MCH: 36.8 pg — ABNORMAL HIGH (ref 26.0–34.0)
MCH: 36.3 pg — ABNORMAL HIGH (ref 26.0–34.0)
MCHC: 35.5 g/dL (ref 30.0–36.0)
MCHC: 34.6 g/dL (ref 30.0–36.0)
MCV: 103.8 fL — ABNORMAL HIGH (ref 80.0–100.0)
MCV: 105 fL — ABNORMAL HIGH (ref 80.0–100.0)
Platelets: 201 10*3/uL (ref 150–400)
Platelets: 191 10*3/uL (ref 150–400)
RBC: 3.42 MIL/uL — ABNORMAL LOW (ref 4.22–5.81)
RBC: 3.39 MIL/uL — ABNORMAL LOW (ref 4.22–5.81)
RDW: 13.2 % (ref 11.5–15.5)
RDW: 13.3 % (ref 11.5–15.5)
WBC: 5.8 10*3/uL (ref 4.0–10.5)
WBC: 6.2 10*3/uL (ref 4.0–10.5)
nRBC: 0 % (ref 0.0–0.2)
nRBC: 0 % (ref 0.0–0.2)

## 2021-07-02 LAB — LIPID PANEL
Cholesterol: 95 mg/dL (ref 0–200)
HDL: 32 mg/dL — ABNORMAL LOW (ref >40)
LDL Cholesterol: 37 mg/dL (ref 0–99)
Total CHOL/HDL Ratio: 3 RATIO
Triglycerides: 129 mg/dL (ref <150)
VLDL: 26 mg/dL (ref 0–40)

## 2021-07-02 LAB — ECHOCARDIOGRAM LIMITED
Area-P 1/2: 3.53 cm2
Height: 64 in
S' Lateral: 3.4 cm
Weight: 3233.6 oz

## 2021-07-02 LAB — TROPONIN I (HIGH SENSITIVITY): Troponin I (High Sensitivity): 8 ng/L (ref <18)

## 2021-07-02 LAB — HEPARIN LEVEL (UNFRACTIONATED): Heparin Unfractionated: 0.32 IU/mL (ref 0.30–0.70)

## 2021-07-02 MED ORDER — PRAVASTATIN SODIUM 40 MG PO TABS: 40.0000 mg | ORAL_TABLET | Freq: Every day | ORAL | 2 refills | Status: DC

## 2021-07-02 MED ORDER — ISOSORBIDE MONONITRATE ER 30 MG PO TB24: 90.0000 mg | ORAL_TABLET | Freq: Two times a day (BID) | ORAL | 2 refills | Status: DC

## 2021-07-02 MED ORDER — ISOSORBIDE MONONITRATE ER 60 MG PO TB24: 90.0000 mg | ORAL_TABLET | Freq: Two times a day (BID) | ORAL | Status: DC

## 2021-07-02 NOTE — Plan of Care (Signed)
   Problem: Education: Goal: Understanding of cardiac disease, CV risk reduction, and recovery process will improve Outcome: Progressing   Problem: Activity: Goal: Ability to tolerate increased activity will improve Outcome: Progressing     

## 2021-07-02 NOTE — Discharge Summary (Signed)
Discharge Summary    Patient ID: [name] [name] MRN: [id number]; DOB: September 07, 1966  Admit date: 07/01/2021 Discharge date: 07/02/2021  PCP:  [name], MD   [facility] Providers Cardiologist:  [name], MD }   Discharge Diagnoses    Principal Problem:   Accelerating angina [facility]) Active Problems:   Mixed hyperlipidemia   Sinus bradycardia   Coronary artery disease involving coronary bypass graft of native heart with unstable angina pectoris ([place])   Unstable angina [facility])  Diagnostic Studies/Procedures    Echocardiogram 07/02/21:   1. Overall EF preserved inferior basal hypokinesis . Left ventricular  ejection fraction, by estimation, is 55%. The left ventricle has normal  function. The left ventricle demonstrates regional wall motion  abnormalities (see scoring diagram/findings for  description). Left ventricular diastolic parameters were normal.   2. Right ventricular systolic function is normal. The right ventricular  size is normal. There is normal pulmonary artery systolic pressure.   3. Left atrial size was mildly dilated.   4. Right atrial size was mildly dilated.   5. The mitral valve is normal in structure. No evidence of mitral valve  regurgitation. No evidence of mitral stenosis.   6. Sclerosis particulary the non coronary cusp no stenosis . The aortic  valve is tricuspid. There is moderate calcification of the aortic valve.  Aortic valve regurgitation is not visualized. No aortic stenosis is  present.   7. The inferior vena cava is normal in size with greater than 50%  respiratory variability, suggesting right atrial pressure of 3 mmHg.  ____________   History of Present Illness     [name] [name] is a 55 y.o. male with a hx of  CAD who was seen 07/01/2021 for the evaluation of chest pain.  He underwent CABG in 2003, subsequent BMS to SVG-RCA, in 01/2010 stent to [place] 01/2010, PTCA SVG-RCA 04/2010, DES to SVG-diag, HLD, sinus brady, DM2, and  bipolar disorder. He has undergone cardiac catheterizations in 2016, 2017, 2021>>all with stable disease.   On Tuesday evening he reported an episode of chest pain while laying in the bed. Pain was more of a burning sensation through his entire chest that dis not resolve with Pepto Bismol. Burning lasted into the morning. Wednesday evening, he had recurrent symptoms however this was more of a sharp, mid-chest pain. He took SL NTG with resolution. Episode occurred once again on Friday morning therefore he presented to [facility] for further evaluation.    Hospital Course   Difficult assessment given recurrent chest pain symptoms over the years with stable cath findings. Ideally would see some objective evidence of ischemia to consider repeat cath. Plan was to admit for observation and cycle trops and check echocardiogram. Given his complex history and complexity of decision making for this patient, plan was to transfer to [facility] for ongoing cardiology assessment since cardiology not at [facility] over the weekend.   HsT levels remained negative at 9>>8>>7>>7>>8. Echocardiogram showed stable LVEF at 55% with regional wall motion abnormalities in the basal inferior wall. He was continued on ASA 110m QD, Coreg 3.1271mBID, Pravastatin 8044mD, Ranexa 1000m35mD. Plan was to increase Imdur to 90mg80m. Lisinopril was stopped to allow more BP room for this.    He is currently chest pain free. The patient was seen and examined by Dr.Branch who feels that he is stable and ready for discharge today, 07/02/21.     Consultants: None   Did the patient have an acute coronary  syndrome (MI, NSTEMI, STEMI, etc) this admission?:  No                               Did the patient have a percutaneous coronary intervention (stent / angioplasty)?:  No.    _____________  Discharge Vitals Blood pressure 102/82, pulse 75, temperature 98 F (36.7 C), temperature source Oral, resp. rate 18, height _0  (1.626 m), weight 91.7  kg, SpO2 97 %.  Filed Weights   07/01/21 1213 07/01/21 1315 07/02/21 0341  Weight: 93.9 kg 93.9 kg 91.7 kg    Labs & Radiologic Studies    CBC Recent Labs    07/01/21 1225 07/02/21 0909  WBC 6.4 6.2  HGB 13.6 12.3*  HCT 39.4 35.6*  MCV 107.4* 105.0*  PLT 209 825   Basic Metabolic Panel Recent Labs    07/01/21 1225  NA 134*  K 4.2  CL 98  CO2 26  GLUCOSE 95  BUN 22*  CREATININE 1.49*  CALCIUM 9.4   Liver Function Tests No results for input(s): AST, ALT, ALKPHOS, BILITOT, PROT, ALBUMIN in the last 72 hours. No results for input(s): LIPASE, AMYLASE in the last 72 hours. High Sensitivity Troponin:   Recent Labs  Lab 07/01/21 1225 07/01/21 1409 07/02/21 0621  TROPONINIHS _1 BNP Invalid input(s): POCBNP D-Dimer No results for input(s): DDIMER in the last 72 hours. Hemoglobin A1C No results for input(s): HGBA1C in the last 72 hours. Fasting Lipid Panel Recent Labs    07/02/21 0020  CHOL 95  HDL 32*  LDLCALC 37  TRIG 129  CHOLHDL 3.0   Thyroid Function Tests No results for input(s): TSH, T4TOTAL, T3FREE, THYROIDAB in the last 72 hours.  Invalid input(s): FREET3 _____________  DG Chest 2 View  Result Date: 07/01/2021 CLINICAL DATA:  Chest pain. EXAM: CHEST - 2 VIEW COMPARISON:  August 19, 2020. FINDINGS: The heart size and mediastinal contours are within normal limits. Median sternotomy and CABG. Both lungs are clear. No visible pleural effusions or pneumothorax. No acute osseous abnormality. Osteopenia. Degenerative changes of the thoracic spine. IMPRESSION: No evidence of acute cardiopulmonary disease. Electronically Signed   By: Margaretha Sheffield M.D.   On: 07/01/2021 13:19   ECHOCARDIOGRAM LIMITED  Result Date: 07/02/2021    ECHOCARDIOGRAM LIMITED REPORT   Patient Name:   [name] [name] Date of Exam: 07/02/2021 Medical Rec #:  [id number]        Height:       64.0 in Accession #:    [id number]       Weight:       202.1 lb Date of Birth:   December 08, 1965         BSA:          1.965 m Patient Age:    55 years         BP:           113/72 mmHg Patient Gender: M                HR:           64 bpm. Exam Location:  Inpatient Procedure: Limited Echo, Limited Color Doppler and Cardiac Doppler Indications:    chest pain  History:        Patient has prior history of Echocardiogram examinations, most                 recent 08/19/2020.  Cardiomyopathy, CAD, Prior CABG; Risk                 Factors:Hypertension, Dyslipidemia and Diabetes.  Sonographer:    Johny Chess RDCS Referring Phys: [id number] [place]  1. Overall EF preserved inferior basal hypokinesis . Left ventricular ejection fraction, by estimation, is 55%. The left ventricle has normal function. The left ventricle demonstrates regional wall motion abnormalities (see scoring diagram/findings for description). Left ventricular diastolic parameters were normal.  2. Right ventricular systolic function is normal. The right ventricular size is normal. There is normal pulmonary artery systolic pressure.  3. Left atrial size was mildly dilated.  4. Right atrial size was mildly dilated.  5. The mitral valve is normal in structure. No evidence of mitral valve regurgitation. No evidence of mitral stenosis.  6. Sclerosis particulary the non coronary cusp no stenosis . The aortic valve is tricuspid. There is moderate calcification of the aortic valve. Aortic valve regurgitation is not visualized. No aortic stenosis is present.  7. The inferior vena cava is normal in size with greater than 50% respiratory variability, suggesting right atrial pressure of 3 mmHg. FINDINGS  Left Ventricle: Overall EF preserved inferior basal hypokinesis. Left ventricular ejection fraction, by estimation, is 55%. The left ventricle has normal function. The left ventricle demonstrates regional wall motion abnormalities. The left ventricular internal cavity size was normal in size. There is no left ventricular  hypertrophy. Left ventricular diastolic parameters were normal. Right Ventricle: The right ventricular size is normal. No increase in right ventricular wall thickness. Right ventricular systolic function is normal. There is normal pulmonary artery systolic pressure. The tricuspid regurgitant velocity is 2.36 m/s, and  with an assumed right atrial pressure of 3 mmHg, the estimated right ventricular systolic pressure is 70.4 mmHg. Left Atrium: Left atrial size was mildly dilated. Right Atrium: Right atrial size was mildly dilated. Pericardium: There is no evidence of pericardial effusion. Mitral Valve: The mitral valve is normal in structure. No evidence of mitral valve stenosis. Tricuspid Valve: The tricuspid valve is normal in structure. Tricuspid valve regurgitation is trivial. No evidence of tricuspid stenosis. Aortic Valve: Sclerosis particulary the non coronary cusp no stenosis. The aortic valve is tricuspid. There is moderate calcification of the aortic valve. Aortic valve regurgitation is not visualized. No aortic stenosis is present. Pulmonic Valve: The pulmonic valve was normal in structure. Pulmonic valve regurgitation is not visualized. No evidence of pulmonic stenosis. Aorta: The aortic root is normal in size and structure. Venous: The inferior vena cava is normal in size with greater than 50% respiratory variability, suggesting right atrial pressure of 3 mmHg. IAS/Shunts: No atrial level shunt detected by color flow Doppler. LEFT VENTRICLE PLAX 2D LVIDd:         4.80 cm Diastology LVIDs:         3.40 cm LV e' medial:    7.51 cm/s LV PW:         1.00 cm LV E/e' medial:  9.8 LV IVS:        0.90 cm LV e' lateral:   12.00 cm/s                        LV E/e' lateral: 6.1  IVC IVC diam: 1.90 cm LEFT ATRIUM         Index LA diam:    3.90 cm 1.98 cm/m  AORTIC VALVE LVOT Vmax:   116.00 cm/s LVOT Vmean:  75.900 cm/s LVOT VTI:  0.247 m  AORTA Ao Asc diam: 2.70 cm MITRAL VALVE               TRICUSPID VALVE MV  Area (PHT): 3.53 cm    TR Peak grad:   22.3 mmHg MV Decel Time: 215 msec    TR Vmax:        236.00 cm/s MV E velocity: 73.70 cm/s MV A velocity: 73.70 cm/s  SHUNTS MV E/A ratio:  1.00        Systemic VTI: 0.25 m Jenkins Rouge MD Electronically signed by Jenkins Rouge MD Signature Date/Time: 07/02/2021/10:20:04 AM    Final    SLEEP STUDY DOCUMENTS  Result Date: 06/20/2021 Ordered by an unspecified provider.  Disposition   Pt is being discharged home today in good condition.  Follow-up Plans & Appointments     Follow-up Information     Verta Ellen., NP. Go on 07/21/2021.   Specialty: Cardiology Why: at 2pm Contact information: [place] [address] [phone]                Discharge Medications   Allergies as of 07/02/2021   No Known Allergies      Medication List     STOP taking these medications    lisinopril 2.5 MG tablet Commonly known as: ZESTRIL       TAKE these medications    acetaminophen 325 MG tablet Commonly known as: TYLENOL Take 2 tablets (650 mg total) by mouth every 4 (four) hours as needed for headache or mild pain.   allopurinol 300 MG tablet Commonly known as: ZYLOPRIM Take 300 mg by mouth daily.   aspirin EC 81 MG tablet Take 1 tablet (81 mg total) by mouth daily.   Blood Pressure Monitoring Kit 1 each by Does not apply route as directed.   carvedilol 3.125 MG tablet Commonly known as: COREG TAKE ONE TABLET BY MOUTH TWICE DAILY WITH MEALS What changed:  how much to take how to take this when to take this additional instructions   clopidogrel 75 MG tablet Commonly known as: PLAVIX TAKE ONE TABLET BY MOUTH DAILY   diazepam 10 MG tablet Commonly known as: VALIUM Take 10 mg by mouth 2 (two) times daily.   Flovent HFA 220 MCG/ACT inhaler Generic drug: fluticasone Inhale 1 puff into the lungs daily.   furosemide 40 MG tablet Commonly known as: LASIX TAKE ONE TABLET BY MOUTH DAILY    isosorbide mononitrate 30 MG 24 hr tablet Commonly known as: IMDUR Take 3 tablets (90 mg total) by mouth 2 (two) times daily. What changed:  medication strength how much to take   metFORMIN 1000 MG tablet Commonly known as: GLUCOPHAGE Take 1,000 mg by mouth daily with breakfast.   nitroGLYCERIN 0.4 MG SL tablet Commonly known as: NITROSTAT PLACE 1 TABLET UNDER THE TONGUE AS NEEDED FOR CHEST PAIN. MAY REPEAT EVERY 5 MINUTES UP TO 3 DOSES. IF NO RELIEF AFTER 3 DOSES, CALL 911 OR What changed: See the new instructions.   omega-3 acid ethyl esters 1 g capsule Commonly known as: LOVAZA Take 1 capsule by mouth every morning. & 2 capsules at night   OneTouch Verio test strip Generic drug: glucose blood   pantoprazole 40 MG tablet Commonly known as: PROTONIX Take 40 mg by mouth daily.   pravastatin 40 MG tablet Commonly known as: PRAVACHOL Take 1 tablet (40 mg total) by mouth daily. Start taking on: July 03, 2021 What changed:  medication  strength how much to take when to take this   ranolazine 1000 MG SR tablet Commonly known as: RANEXA TAKE ONE TABLET BY MOUTH 2 TIMES A DAY         Outstanding Labs/Studies   None  Duration of Discharge Encounter   Greater than 30 minutes including physician time.  Signed, Kathyrn Drown, NP 07/02/2021, 1:23 PM

## 2021-07-02 NOTE — Progress Notes (Signed)
Progress Note  Patient Name: [name] [name] Date of Encounter: 07/02/2021  [facility] Cardiologist: Nona Dell, MD   Subjective   No recurrent chest pains.   Inpatient Medications    Scheduled Meds:  aspirin EC  81 mg Oral Daily   budesonide  0.5 mg Nebulization BID   carvedilol  3.125 mg Oral BID WC   clopidogrel  75 mg Oral Daily   diazepam  10 mg Oral BID   furosemide  40 mg Oral Daily   influenza vac split quadrivalent PF  0.5 mL Intramuscular Tomorrow-1000   isosorbide mononitrate  60 mg Oral BID   lisinopril  2.5 mg Oral Daily   pantoprazole  40 mg Oral Daily   pravastatin  40 mg Oral Daily   ranolazine  1,000 mg Oral BID   Continuous Infusions:  heparin 1,300 Units/hr (07/02/21 1020)   PRN Meds: acetaminophen, nitroGLYCERIN, ondansetron (ZOFRAN) IV   Vital Signs    Vitals:   07/02/21 0749 07/02/21 0844 07/02/21 0918 07/02/21 1030  BP: 113/72 102/65  102/82  Pulse: 65 64  75  Resp: 13 19  18   Temp: 98.2 F (36.8 C) 98.5 F (36.9 C)  98 F (36.7 C)  TempSrc: Oral Oral  Oral  SpO2: 96% 99% 99% 97%  Weight:      Height:        Intake/Output Summary (Last 24 hours) at 07/02/2021 1203 Last data filed at 07/02/2021 1032 Gross per 24 hour  Intake 634.39 ml  Output 750 ml  Net -115.61 ml   Last 3 Weights 07/02/2021 07/01/2021 07/01/2021  Weight (lbs) 202 lb 1.6 oz 207 lb 207 lb  Weight (kg) 91.672 kg 93.895 kg 93.895 kg      Telemetry    SR and sinus brady - Personally Reviewed  ECG    Sinus brady, no acute ischemic chnages - Personally Reviewed  Physical Exam   GEN: No acute distress.   Neck: No JVD Cardiac: RRR, no murmurs, rubs, or gallops.  Respiratory: Clear to auscultation bilaterally. GI: Soft, nontender, non-distended  MS: No edema; No deformity. Neuro:  Nonfocal  Psych: Normal affect   Labs    High Sensitivity Troponin:   Recent Labs  Lab 07/01/21 1225 07/01/21 1409 07/02/21 0621  TROPONINIHS 7 7 8       Chemistry Recent Labs  Lab 07/01/21 1225  NA 134*  K 4.2  CL 98  CO2 26  GLUCOSE 95  BUN 22*  CREATININE 1.49*  CALCIUM 9.4  GFRNONAA 55*  ANIONGAP 10    Lipids  Recent Labs  Lab 07/02/21 0020  CHOL 95  TRIG 129  HDL 32*  LDLCALC 37  CHOLHDL 3.0    Hematology Recent Labs  Lab 07/01/21 1225 07/02/21 0909  WBC 6.4 6.2  RBC 3.67* 3.39*  HGB 13.6 12.3*  HCT 39.4 35.6*  MCV 107.4* 105.0*  MCH 37.1* 36.3*  MCHC 34.5 34.6  RDW 13.7 13.3  PLT 209 191   Thyroid No results for input(s): TSH, FREET4 in the last 168 hours.  BNPNo results for input(s): BNP, PROBNP in the last 168 hours.  DDimer No results for input(s): DDIMER in the last 168 hours.   Radiology    DG Chest 2 View  Result Date: 07/01/2021 CLINICAL DATA:  Chest pain. EXAM: CHEST - 2 VIEW COMPARISON:  August 19, 2020. FINDINGS: The heart size and mediastinal contours are within normal limits. Median sternotomy and CABG. Both lungs are clear. No visible pleural  effusions or pneumothorax. No acute osseous abnormality. Osteopenia. Degenerative changes of the thoracic spine. IMPRESSION: No evidence of acute cardiopulmonary disease. Electronically Signed   By: Feliberto Harts M.D.   On: 07/01/2021 13:19   ECHOCARDIOGRAM LIMITED  Result Date: 07/02/2021    ECHOCARDIOGRAM LIMITED REPORT   Patient Name:   [name] [name] Date of Exam: 07/02/2021 Medical Rec #:  [id number]        Height:       64.0 in Accession #:    [id number]       Weight:       202.1 lb Date of Birth:  Jan 11, 1966         BSA:          1.965 m Patient Age:    55 years         BP:           113/72 mmHg Patient Gender: M                HR:           64 bpm. Exam Location:  Inpatient Procedure: Limited Echo, Limited Color Doppler and Cardiac Doppler Indications:    chest pain  History:        Patient has prior history of Echocardiogram examinations, most                 recent 08/19/2020. Cardiomyopathy, CAD, Prior CABG; Risk                  Factors:Hypertension, Dyslipidemia and Diabetes.  Sonographer:    Delcie Roch RDCS Referring Phys: [id number] Lennart Pall STRADER IMPRESSIONS  1. Overall EF preserved inferior basal hypokinesis . Left ventricular ejection fraction, by estimation, is 55%. The left ventricle has normal function. The left ventricle demonstrates regional wall motion abnormalities (see scoring diagram/findings for description). Left ventricular diastolic parameters were normal.  2. Right ventricular systolic function is normal. The right ventricular size is normal. There is normal pulmonary artery systolic pressure.  3. Left atrial size was mildly dilated.  4. Right atrial size was mildly dilated.  5. The mitral valve is normal in structure. No evidence of mitral valve regurgitation. No evidence of mitral stenosis.  6. Sclerosis particulary the non coronary cusp no stenosis . The aortic valve is tricuspid. There is moderate calcification of the aortic valve. Aortic valve regurgitation is not visualized. No aortic stenosis is present.  7. The inferior vena cava is normal in size with greater than 50% respiratory variability, suggesting right atrial pressure of 3 mmHg. FINDINGS  Left Ventricle: Overall EF preserved inferior basal hypokinesis. Left ventricular ejection fraction, by estimation, is 55%. The left ventricle has normal function. The left ventricle demonstrates regional wall motion abnormalities. The left ventricular internal cavity size was normal in size. There is no left ventricular hypertrophy. Left ventricular diastolic parameters were normal. Right Ventricle: The right ventricular size is normal. No increase in right ventricular wall thickness. Right ventricular systolic function is normal. There is normal pulmonary artery systolic pressure. The tricuspid regurgitant velocity is 2.36 m/s, and  with an assumed right atrial pressure of 3 mmHg, the estimated right ventricular systolic pressure is 25.3 mmHg. Left Atrium: Left  atrial size was mildly dilated. Right Atrium: Right atrial size was mildly dilated. Pericardium: There is no evidence of pericardial effusion. Mitral Valve: The mitral valve is normal in structure. No evidence of mitral valve stenosis. Tricuspid Valve: The tricuspid valve is normal in structure. Tricuspid valve  regurgitation is trivial. No evidence of tricuspid stenosis. Aortic Valve: Sclerosis particulary the non coronary cusp no stenosis. The aortic valve is tricuspid. There is moderate calcification of the aortic valve. Aortic valve regurgitation is not visualized. No aortic stenosis is present. Pulmonic Valve: The pulmonic valve was normal in structure. Pulmonic valve regurgitation is not visualized. No evidence of pulmonic stenosis. Aorta: The aortic root is normal in size and structure. Venous: The inferior vena cava is normal in size with greater than 50% respiratory variability, suggesting right atrial pressure of 3 mmHg. IAS/Shunts: No atrial level shunt detected by color flow Doppler. LEFT VENTRICLE PLAX 2D LVIDd:         4.80 cm Diastology LVIDs:         3.40 cm LV e' medial:    7.51 cm/s LV PW:         1.00 cm LV E/e' medial:  9.8 LV IVS:        0.90 cm LV e' lateral:   12.00 cm/s                        LV E/e' lateral: 6.1  IVC IVC diam: 1.90 cm LEFT ATRIUM         Index LA diam:    3.90 cm 1.98 cm/m  AORTIC VALVE LVOT Vmax:   116.00 cm/s LVOT Vmean:  75.900 cm/s LVOT VTI:    0.247 m  AORTA Ao Asc diam: 2.70 cm MITRAL VALVE               TRICUSPID VALVE MV Area (PHT): 3.53 cm    TR Peak grad:   22.3 mmHg MV Decel Time: 215 msec    TR Vmax:        236.00 cm/s MV E velocity: 73.70 cm/s MV A velocity: 73.70 cm/s  SHUNTS MV E/A ratio:  1.00        Systemic VTI: 0.25 m Charlton Haws MD Electronically signed by Charlton Haws MD Signature Date/Time: 07/02/2021/10:20:04 AM    Final     Cardiac Studies    Patient Profile     Mr. Want 89 to male history of CAD with CABG in 2003, subsequent MBS to  SVG-RCA, in 01/2010 stent to SVG-RCA, 04/2010 PTCA SVG-RCA, DES to SVG-diag, HL, sinus brady, DM2, bipolar presents with chest pain  Assessment & Plan    1.CAD/chest pain - extensive history of CAD as reported above with prior CABG and stents - long history of chest pain with 2016,2017,2021 caths with essentially stable disease and no PCI targets - difficult assessment given recurrent chest pain symptosm over the years with stable cath finding in patient with history of such extensive disease. Admitted with different types of chest pain, some episodes atypical some more typical. Varying response to NG.    Ideally would see some objective evidence of ischemia to consider repeat cath.  - trops remain negative without significant delta - EKG no acute ischemic changes.  - echo LVEF 55%, inferior basal hypokinesis  - he is on ASA 81, coreg 3.125mg  bid, imdur 60mg  bid, lisinopril 2.5mg , pravastatin 40mg , ranexa 1000mg  bid, hep gtt - bradycardia prohibits increase beta blocker. Stop ACE-I for room with bp to increase imdur to 90mg  bid.   - no recurrent chest pains monitored overnight, no objective evidence of ischemia by workup described above, stable anatomy not amenable to PCI from last 3 caths. Plan for discharge today on higher imdur dosing.  For questions or updates, please contact [facility] Please consult [email or web address] for contact info under        Signed, Dina Rich, MD  07/02/2021, 12:03 PM

## 2021-07-02 NOTE — Plan of Care (Signed)

## 2021-07-02 NOTE — Progress Notes (Signed)
  Echocardiogram 2D Echocardiogram has been performed.  Delcie Roch 07/02/2021, 8:40 AM

## 2021-07-02 NOTE — Progress Notes (Signed)
ANTICOAGULATION CONSULT NOTE - Follow-up Consult  Pharmacy Consult for heparin Indication: chest pain/ACS  No Known Allergies  Patient Measurements: Height: 5\' 4"  (162.6 cm) Weight: 91.7 kg (202 lb 1.6 oz) IBW/kg (Calculated) : 59.2 Heparin Dosing Weight: 80 kg  Vital Signs: Temp: 97.7 F (36.5 C) (09/24 0341) Temp Source: Oral (09/24 0341) BP: 99/65 (09/24 0341) Pulse Rate: [name] (09/24 0341)  Labs: Recent Labs    07/01/21 1225 07/01/21 1409 07/01/21 2254  HGB 13.6  --   --   HCT 39.4  --   --   PLT 209  --   --   HEPARINUNFRC  --   --  0.19*  CREATININE 1.49*  --   --   TROPONINIHS 7 7  --     Estimated Creatinine Clearance: 57.9 mL/min (A) (by C-G formula based on SCr of 1.49 mg/dL (H)).  Medical History: Past Medical History:  Diagnosis Date   Asthma    Bipolar disorder (HCC)    Chronic back pain    Coronary atherosclerosis of native coronary artery    a. CABG 2003. b. NSTEMI 2010 s/p BMS to SVG-RCA c. PTCA/stent/asp thromb to SVG-RCA 01/2010, PTCA to SVG-RCA 04/2010 d. known occlusion of SVG-RCA 2013. e. NSTEMI 07/2013  s/p PTCA/DES to SVG-diagonal/intermediate/OM1.    Essential hypertension    GERD (gastroesophageal reflux disease)    Gout    Ischemic cardiomyopathy    a. EF 35-40% by cath 07/2013.   Mixed hyperlipidemia    Sinus bradycardia    Type 2 diabetes mellitus (HCC)     Medications:  Infusions:   heparin 1,250 Units/hr (07/02/21 0910)    Assessment: 55 [name] presents with chest pain. Not on anticoagulation PTA. Pharmacy consulted for heparin dosing.  Heparin level of 0.32 is therapeutic on 1250 units/hr. CBC normal - Hgb slightly below baseline at 12.3, plt normal. No bleeding or infusion problems noted.   Goal of Therapy:  Heparin level 0.3-0.7 units/ml Monitor platelets by anticoagulation protocol: Yes   Plan:  Will empirically increase gtt to 1300 units/hr due to borderline therapeutic heparin level Daily CBC, heparin level Monitor for  s/sx of bleeding  58, PharmD PGY1 Pharmacy Resident 07/02/2021  7:26 AM  Please check [email or web address] for unit-specific pharmacy phone numbers.

## 2021-07-06 DIAGNOSIS — I208 Other forms of angina pectoris: Secondary | ICD-10-CM | POA: Diagnosis not present

## 2021-07-20 NOTE — Progress Notes (Signed)
Cardiology Office Note  Date: 07/21/2021   ID: [name] [name], DOB 1966-03-23, MRN [id number]  PCP:  [name] Burly, MD  Cardiologist:  [name] Lesches, MD Electrophysiologist:  None   Chief Complaint: Hospital follow up  History of Present Illness: [name] [name] is a 55 y.o. male with a history of HTN, sinus bradycardia, chest pain, ischemic cardiomyopathy, CAD/CABG, DM2, hyperlipidemia, tobacco user, polysubstance abuse.  Previous history of CABG 2003 with subsequent BMS to SVG-RCA.  05/12/2010 stent to SVG-RCA, PTCA SVG-RCA July 2011, DES to SVG-diagonal.  Cardiac catheterizations in 2016, 2017, 2021 with stable disease.  He presented to [facility] ED on 07/01/2021 with reported episode of chest pain prior evening while laying in bed.  Described as more of a burning sensation to her entire chest.  Did not resolve with Pepto-Bismol.  Burning lasted into the morning.  On Wednesday evening he had recurrent symptoms described as more sharp, mid chest.  He took sublingual nitroglycerin with resolution.  Pain recurred again on Friday morning and he presented to [facility] for further evaluation.  Plan was to transfer to [facility] for ongoing cardiac assessment.  His troponin levels remained negative at 9-8-7-7-8.  Echocardiogram was stable with EF of 55% with + WMA's and basal inferior wall.  He was continuing aspirin 81 mg daily, Coreg 3.125 mg p.o. twice daily, pravastatin 80 mg daily, Ranexa 1000 mg p.o. twice daily.  Imdur was increased to 90 mg p.o. twice daily.  Lisinopril was stopped to allow for more BP room for increased nitrate dosage.  He was seen by Dr. Harl Bowie who felt he was stable and ready for discharge on 07/02/2021.  He is here for hospital follow-up today he denies any further episodes of chest pain or burning sensation in his chest.  He does state he has gastric ulcers.  He currently takes Protonix for gastroesophageal reflux disease.  He denies any current anginal symptoms  or nitroglycerin use.  His Imdur was increased to 90 mg p.o. twice daily.  Prior to discharge from ED.  Blood pressure reasonably well controlled today at 126/88.  He states he likes to eat a lot of peanut butter and sometimes it makes his stomach upset and he can have issues with his stomach burning and more reflux symptoms.    Past Medical History:  Diagnosis Date   Asthma    Bipolar disorder ([place])    Chronic back pain    Coronary atherosclerosis of native coronary artery    a. CABG 2003. b. NSTEMI 2010 s/p BMS to SVG-RCA c. PTCA/stent/asp thromb to SVG-RCA 01/2010, PTCA to SVG-RCA 04/2010 d. known occlusion of SVG-RCA 2013. e. NSTEMI 07/2013  s/p PTCA/DES to SVG-diagonal/intermediate/OM1.    Essential hypertension    GERD (gastroesophageal reflux disease)    Gout    Ischemic cardiomyopathy    a. EF 35-40% by cath 07/2013.   Mixed hyperlipidemia    Sinus bradycardia    Type 2 diabetes mellitus [facility])     Past Surgical History:  Procedure Laterality Date   CARDIAC CATHETERIZATION  06/19/2014   Procedure: CORONARY STENT INTERVENTION;  Surgeon: Leonie Man, MD;  Location: [facility] CATH LAB;  Service: Cardiovascular;;  DES to seg SVG Diag/OM/Ramus    CARDIAC CATHETERIZATION N/A 11/22/2015   Procedure: Left Heart Cath and Coronary Angiography;  Surgeon: Belva Crome, MD;  Location: [place] CV LAB;  Service: Cardiovascular;  Laterality: N/A;   CARDIAC CATHETERIZATION  08/19/2020   CHOLECYSTECTOMY  CORONARY ARTERY BYPASS GRAFT     2003, LIMA to LAD; SVG to diagonal; SVG to OM, ramus, and diagonal; SVG to RCA   LEFT HEART CATH AND CORS/GRAFTS ANGIOGRAPHY N/A 08/19/2020   Procedure: LEFT HEART CATH AND CORS/GRAFTS ANGIOGRAPHY;  Surgeon: Nelva Bush, MD;  Location: [place] CV LAB;  Service: Cardiovascular;  Laterality: N/A;   LEFT HEART CATHETERIZATION WITH CORONARY ANGIOGRAM N/A 01/28/2015   Procedure: LEFT HEART CATHETERIZATION WITH CORONARY ANGIOGRAM;  Surgeon: Leonie Man,  MD;  Location: [facility] CATH LAB;  Service: Cardiovascular;  Laterality: N/A;   LEFT HEART CATHETERIZATION WITH CORONARY/GRAFT ANGIOGRAM N/A 07/14/2013   Procedure: LEFT HEART CATHETERIZATION WITH Beatrix Fetters;  Surgeon: Burnell Blanks, MD;  Location: [facility] CATH LAB;  Service: Cardiovascular;  Laterality: N/A;   LEFT HEART CATHETERIZATION WITH CORONARY/GRAFT ANGIOGRAM N/A 12/29/2013   Procedure: LEFT HEART CATHETERIZATION WITH Beatrix Fetters;  Surgeon: Leonie Man, MD;  Location: [facility] CATH LAB;  Service: Cardiovascular;  Laterality: N/A;   LEFT HEART CATHETERIZATION WITH CORONARY/GRAFT ANGIOGRAM N/A 06/19/2014   Procedure: LEFT HEART CATHETERIZATION WITH Beatrix Fetters;  Surgeon: Leonie Man, MD;  Location: [facility] CATH LAB;  Service: Cardiovascular;  Laterality: N/A;   Metal plate to chin     PERCUTANEOUS CORONARY STENT INTERVENTION (PCI-S)  07/14/2013   Procedure: PERCUTANEOUS CORONARY STENT INTERVENTION (PCI-S);  Surgeon: Burnell Blanks, MD;  Location: [facility] CATH LAB;  Service: Cardiovascular;;   Pin placement left leg      Current Outpatient Medications  Medication Sig Dispense Refill   acetaminophen (TYLENOL) 325 MG tablet Take 2 tablets (650 mg total) by mouth every 4 (four) hours as needed for headache or mild pain.     allopurinol (ZYLOPRIM) 300 MG tablet Take 300 mg by mouth daily.  0   aspirin EC 81 MG tablet Take 1 tablet (81 mg total) by mouth daily.     Blood Pressure Monitoring KIT 1 each by Does not apply route as directed. 1 kit 0   carvedilol (COREG) 3.125 MG tablet TAKE ONE TABLET BY MOUTH TWICE DAILY WITH MEALS (Patient taking differently: Take 3.125 mg by mouth 2 (two) times daily with a meal.) 180 tablet 2   clopidogrel (PLAVIX) 75 MG tablet TAKE ONE TABLET BY MOUTH DAILY (Patient taking differently: Take 75 mg by mouth daily.) 90 tablet 3   diazepam (VALIUM) 10 MG tablet Take 10 mg by mouth 2 (two) times daily.     FLOVENT HFA 220 MCG/ACT  inhaler Inhale 1 puff into the lungs daily.      furosemide (LASIX) 40 MG tablet TAKE ONE TABLET BY MOUTH DAILY (Patient taking differently: Take 40 mg by mouth daily.) 90 tablet 3   isosorbide mononitrate (IMDUR) 30 MG 24 hr tablet Take 3 tablets (90 mg total) by mouth 2 (two) times daily. 120 tablet 2   metFORMIN (GLUCOPHAGE) 1000 MG tablet Take 1,000 mg by mouth daily with breakfast.     nitroGLYCERIN (NITROSTAT) 0.4 MG SL tablet PLACE 1 TABLET UNDER THE TONGUE AS NEEDED FOR CHEST PAIN. MAY REPEAT EVERY 5 MINUTES UP TO 3 DOSES. IF NO RELIEF AFTER 3 DOSES, CALL 911 OR (Patient taking differently: Place 0.4 mg under the tongue every 5 (five) minutes as needed for chest pain.) 25 tablet 0   omega-3 acid ethyl esters (LOVAZA) 1 g capsule Take 1 capsule by mouth every morning. & 2 capsules at night     ONETOUCH VERIO test strip      pantoprazole (PROTONIX) 40  MG tablet Take 40 mg by mouth daily.      pravastatin (PRAVACHOL) 40 MG tablet Take 1 tablet (40 mg total) by mouth daily. 60 tablet 2   ranolazine (RANEXA) 1000 MG SR tablet TAKE ONE TABLET BY MOUTH 2 TIMES A DAY (Patient taking differently: Take 1,000 mg by mouth 2 (two) times daily.) 180 tablet 3   No current facility-administered medications for this visit.   Allergies:  Patient has no known allergies.   Social History: The patient  reports that he has been smoking cigarettes. He started smoking about 43 years ago. He has a 26.40 pack-year smoking history. He has never used smokeless tobacco. He reports that he does not currently use alcohol. He reports current drug use. Frequency: 2.00 times per week. Drug: Marijuana.   Family History: The patient's family history includes Bowel Disease in his mother; Hernia in his father; Pneumonia in his daughter; Stroke in an other family member.   ROS:  Please see the history of present illness. Otherwise, complete review of systems is positive for none.  All other systems are reviewed and negative.    Physical Exam: VS:  BP 126/88   Pulse 82   Ht 5' 4"  (1.626 m)   Wt 204 lb (92.5 kg)   SpO2 97%   BMI 35.02 kg/m , BMI Body mass index is 35.02 kg/m.  Wt Readings from Last 3 Encounters:  07/21/21 204 lb (92.5 kg)  07/02/21 202 lb 1.6 oz (91.7 kg)  04/15/21 208 lb 11.2 oz (94.7 kg)    General: Patient appears comfortable at rest. Neck: Supple, no elevated JVP or carotid bruits, no thyromegaly. Lungs: Clear to auscultation, nonlabored breathing at rest. Cardiac: Regular rate and rhythm, no S3 or significant systolic murmur, no pericardial rub. Extremities: No pitting edema, distal pulses 2+. Skin: Warm and dry. Musculoskeletal: No kyphosis. Neuropsychiatric: Alert and oriented x3, affect grossly appropriate.  ECG:    Recent Labwork: 08/20/2020: ALT 10; AST 15 07/01/2021: BUN 22; Creatinine, Ser 1.49; Potassium 4.2; Sodium 134 07/02/2021: Hemoglobin 12.3; Platelets 191     Component Value Date/Time   CHOL 95 07/02/2021 0020   TRIG 129 07/02/2021 0020   HDL 32 (L) 07/02/2021 0020   CHOLHDL 3.0 07/02/2021 0020   VLDL 26 07/02/2021 0020   LDLCALC 37 07/02/2021 0020    Other Studies Reviewed Today:    Echocardiogram 07/02/21:   1. Overall EF preserved inferior basal hypokinesis . Left ventricular  ejection fraction, by estimation, is 55%. The left ventricle has normal  function. The left ventricle demonstrates regional wall motion  abnormalities (see scoring diagram/findings for  description). Left ventricular diastolic parameters were normal.   2. Right ventricular systolic function is normal. The right ventricular  size is normal. There is normal pulmonary artery systolic pressure.   3. Left atrial size was mildly dilated.   4. Right atrial size was mildly dilated.   5. The mitral valve is normal in structure. No evidence of mitral valve  regurgitation. No evidence of mitral stenosis.   6. Sclerosis particulary the non coronary cusp no stenosis . The aortic  valve  is tricuspid. There is moderate calcification of the aortic valve.  Aortic valve regurgitation is not visualized. No aortic stenosis is  present.   7. The inferior vena cava is normal in size with greater than 50%  respiratory variability, suggesting right atrial pressure of 3 mmHg.  ____________   Assessment and Plan:  1. Unstable angina ([place])   2.  Sinus bradycardia   3. CAD in native artery   4. Mixed hyperlipidemia    1. Unstable angina (HCC) Currently denies any anginal or exertional symptoms or nitroglycerin use.  His Imdur was increased to 90 mg p.o. twice a day prior to discharge from recent ER visit for chest pain.  He ruled out for ACS.  Continue Imdur 90 mg p.o. twice daily.  Continue carvedilol 3.125 mg p.o. twice daily, Plavix 75 mg daily, Ranexa 1000 mg p.o. twice daily, sublingual nitroglycerin as needed, aspirin 81 mg daily.  2. Sinus bradycardia Heart rate 82 today.  3. CAD in native artery Denies any anginal symptoms.  Continuing medication as above.  In #1.  4. Mixed hyperlipidemia Continue pravastatin 40 mg p.o. daily.  Continue Lovaza 1 g p.o. daily.  Medication Adjustments/Labs and Tests Ordered: Current medicines are reviewed at length with the patient today.  Concerns regarding medicines are outlined above.   Disposition: Follow-up with Dr. Domenic Polite or APP 6 months  Signed, Levell July, NP 07/21/2021 2:41 PM    [facility], [place], [place] [address] Phone: [phone]; Fax: [phone]

## 2021-07-21 ENCOUNTER — Ambulatory Visit (INDEPENDENT_AMBULATORY_CARE_PROVIDER_SITE_OTHER): Payer: Medicare Other | Admitting: Family Medicine

## 2021-07-21 ENCOUNTER — Other Ambulatory Visit: Payer: Self-pay

## 2021-07-21 ENCOUNTER — Encounter: Payer: Self-pay | Admitting: Family Medicine

## 2021-07-21 VITALS — BP 126/88 | HR 82 | Ht 64.0 in | Wt 204.0 lb

## 2021-07-21 DIAGNOSIS — E782 Mixed hyperlipidemia: Secondary | ICD-10-CM

## 2021-07-21 DIAGNOSIS — R001 Bradycardia, unspecified: Secondary | ICD-10-CM | POA: Diagnosis not present

## 2021-07-21 DIAGNOSIS — I2510 Atherosclerotic heart disease of native coronary artery without angina pectoris: Secondary | ICD-10-CM

## 2021-07-21 DIAGNOSIS — I200 Unstable angina: Secondary | ICD-10-CM

## 2021-07-21 NOTE — Patient Instructions (Signed)
Medication Instructions:  Your physician recommends that you continue on your current medications as directed. Please refer to the Current Medication list given to you today.  *If you need a refill on your cardiac medications before your next appointment, please call your pharmacy*   Lab Work: None ordered  If you have labs (blood work) drawn today and your tests are completely normal, you will receive your results only by: MyChart Message (if you have MyChart) OR A paper copy in the mail If you have any lab test that is abnormal or we need to change your treatment, we will call you to review the results.   Testing/Procedures: None ordered   Follow-Up: At [facility], you and your health needs are our priority.  As part of our continuing mission to provide you with exceptional heart care, we have created designated Provider Care Teams.  These Care Teams include your primary Cardiologist (physician) and Advanced Practice Providers (APPs -  Physician Assistants and Nurse Practitioners) who all work together to provide you with the care you need, when you need it.  We recommend signing up for the patient portal called "MyChart".  Sign up information is provided on this After Visit Summary.  MyChart is used to connect with patients for Virtual Visits (Telemedicine).  Patients are able to view lab/test results, encounter notes, upcoming appointments, etc.  Non-urgent messages can be sent to your provider as well.   To learn more about what you can do with MyChart, go to [email or web address].    Your next appointment:   6 month(s)  The format for your next appointment:   In Person  Provider:   You may see Samuel McDowell, MD or the following Advanced Practice Provider on your designated Care Team:   Andy Quinn, NP   Other Instructions   

## 2021-08-01 DIAGNOSIS — H43813 Vitreous degeneration, bilateral: Secondary | ICD-10-CM | POA: Diagnosis not present

## 2021-08-15 ENCOUNTER — Telehealth: Payer: Self-pay | Admitting: Cardiology

## 2021-08-15 MED ORDER — PRAVASTATIN SODIUM 40 MG PO TABS: 40.0000 mg | ORAL_TABLET | Freq: Every day | ORAL | 3 refills | Status: DC

## 2021-08-15 MED ORDER — ISOSORBIDE MONONITRATE ER 30 MG PO TB24
90.0000 mg | ORAL_TABLET | Freq: Two times a day (BID) | ORAL | 3 refills | Status: DC
Start: 2021-08-15 — End: 2022-08-28

## 2021-08-15 NOTE — Telephone Encounter (Signed)
done

## 2021-08-15 NOTE — Telephone Encounter (Signed)
*  STAT* If patient is at the pharmacy, call can be transferred to refill team.   1. Which medications need to be refilled? (please list name of each medication and dose if known)  isosorbide mononitrate (IMDUR) 30 MG 24 hr tablet- pravastatin (PRAVACHOL) 40 MG tablet  2. Which pharmacy/location (including street and city if local pharmacy) is medication to be sent to? [facility] [address] [place] - [address] [facility] [address]  3. Do they need a 30 day or 90 day supply? 90 with refills   Patient needs these medications to go to [facility], NOT [facility]

## 2021-09-19 DIAGNOSIS — K219 Gastro-esophageal reflux disease without esophagitis: Secondary | ICD-10-CM | POA: Diagnosis not present

## 2021-09-19 DIAGNOSIS — Z0000 Encounter for general adult medical examination without abnormal findings: Secondary | ICD-10-CM | POA: Diagnosis not present

## 2021-09-19 DIAGNOSIS — E1143 Type 2 diabetes mellitus with diabetic autonomic (poly)neuropathy: Secondary | ICD-10-CM | POA: Diagnosis not present

## 2021-09-19 DIAGNOSIS — I10 Essential (primary) hypertension: Secondary | ICD-10-CM | POA: Diagnosis not present

## 2021-09-19 DIAGNOSIS — J449 Chronic obstructive pulmonary disease, unspecified: Secondary | ICD-10-CM | POA: Diagnosis not present

## 2021-10-17 ENCOUNTER — Ambulatory Visit: Payer: Medicare Other | Admitting: Family Medicine

## 2021-10-24 ENCOUNTER — Ambulatory Visit (INDEPENDENT_AMBULATORY_CARE_PROVIDER_SITE_OTHER): Payer: Medicare Other | Admitting: Cardiology

## 2021-10-24 ENCOUNTER — Encounter: Payer: Self-pay | Admitting: Cardiology

## 2021-10-24 ENCOUNTER — Other Ambulatory Visit: Payer: Self-pay

## 2021-10-24 VITALS — BP 110/72 | HR 61 | Ht 64.0 in | Wt 208.8 lb

## 2021-10-24 DIAGNOSIS — E782 Mixed hyperlipidemia: Secondary | ICD-10-CM | POA: Diagnosis not present

## 2021-10-24 DIAGNOSIS — Z720 Tobacco use: Secondary | ICD-10-CM | POA: Diagnosis not present

## 2021-10-24 DIAGNOSIS — Z8679 Personal history of other diseases of the circulatory system: Secondary | ICD-10-CM | POA: Diagnosis not present

## 2021-10-24 DIAGNOSIS — I25119 Atherosclerotic heart disease of native coronary artery with unspecified angina pectoris: Secondary | ICD-10-CM

## 2021-10-24 NOTE — Progress Notes (Signed)
Cardiology Office Note  Date: 10/24/2021   ID: [name] [name], DOB 1966/03/01, MRN [id number]  PCP:  [name], MD  Cardiologist:  [name], MD Electrophysiologist:  None   Chief Complaint  Patient presents with   Cardiac follow-up    History of Present Illness: [name] [name] is a 56 y.o. male last seen in October 2022 by [name] NP.  He is here for a follow-up visit.  Angina symptoms have been stable on current medical therapy.  He remains functional with ADLs, does have to pace himself with outdoor chores.  He has had no palpitations or syncope.  I reviewed his medications which are outlined below and stable from a cardiac perspective.  He does not report any spontaneous bleeding problems on dual antiplatelet therapy.  He anticipates follow-up lab work with PCP in March.  Past Medical History:  Diagnosis Date   Asthma    Bipolar disorder ([place])    Chronic back pain    Coronary atherosclerosis of native coronary artery    a. CABG 2003. b. NSTEMI 2010 s/p BMS to SVG-RCA c. PTCA/stent/asp thromb to SVG-RCA 01/2010, PTCA to SVG-RCA 04/2010 d. known occlusion of SVG-RCA 2013. e. NSTEMI 07/2013  s/p PTCA/DES to SVG-diagonal/intermediate/OM1.    Essential hypertension    GERD (gastroesophageal reflux disease)    Gout    Ischemic cardiomyopathy    a. EF 35-40% by cath 07/2013.   Mixed hyperlipidemia    Sinus bradycardia    Type 2 diabetes mellitus [facility])     Past Surgical History:  Procedure Laterality Date   CARDIAC CATHETERIZATION  06/19/2014   Procedure: CORONARY STENT INTERVENTION;  Surgeon: Leonie Man, MD;  Location: [facility] CATH LAB;  Service: Cardiovascular;;  DES to seg SVG Diag/OM/Ramus    CARDIAC CATHETERIZATION N/A 11/22/2015   Procedure: Left Heart Cath and Coronary Angiography;  Surgeon: Belva Crome, MD;  Location: [place] CV LAB;  Service: Cardiovascular;  Laterality: N/A;   CARDIAC CATHETERIZATION  08/19/2020   CHOLECYSTECTOMY      CORONARY ARTERY BYPASS GRAFT     2003, LIMA to LAD; SVG to diagonal; SVG to OM, ramus, and diagonal; SVG to RCA   LEFT HEART CATH AND CORS/GRAFTS ANGIOGRAPHY N/A 08/19/2020   Procedure: LEFT HEART CATH AND CORS/GRAFTS ANGIOGRAPHY;  Surgeon: Nelva Bush, MD;  Location: [place] CV LAB;  Service: Cardiovascular;  Laterality: N/A;   LEFT HEART CATHETERIZATION WITH CORONARY ANGIOGRAM N/A 01/28/2015   Procedure: LEFT HEART CATHETERIZATION WITH CORONARY ANGIOGRAM;  Surgeon: Leonie Man, MD;  Location: [facility] CATH LAB;  Service: Cardiovascular;  Laterality: N/A;   LEFT HEART CATHETERIZATION WITH CORONARY/GRAFT ANGIOGRAM N/A 07/14/2013   Procedure: LEFT HEART CATHETERIZATION WITH Beatrix Fetters;  Surgeon: Burnell Blanks, MD;  Location: [facility] CATH LAB;  Service: Cardiovascular;  Laterality: N/A;   LEFT HEART CATHETERIZATION WITH CORONARY/GRAFT ANGIOGRAM N/A 12/29/2013   Procedure: LEFT HEART CATHETERIZATION WITH Beatrix Fetters;  Surgeon: Leonie Man, MD;  Location: [facility] CATH LAB;  Service: Cardiovascular;  Laterality: N/A;   LEFT HEART CATHETERIZATION WITH CORONARY/GRAFT ANGIOGRAM N/A 06/19/2014   Procedure: LEFT HEART CATHETERIZATION WITH Beatrix Fetters;  Surgeon: Leonie Man, MD;  Location: [facility] CATH LAB;  Service: Cardiovascular;  Laterality: N/A;   Metal plate to chin     PERCUTANEOUS CORONARY STENT INTERVENTION (PCI-S)  07/14/2013   Procedure: PERCUTANEOUS CORONARY STENT INTERVENTION (PCI-S);  Surgeon: Burnell Blanks, MD;  Location: [facility] CATH LAB;  Service: Cardiovascular;;   Pin  placement left leg      Current Outpatient Medications  Medication Sig Dispense Refill   acetaminophen (TYLENOL) 325 MG tablet Take 2 tablets (650 mg total) by mouth every 4 (four) hours as needed for headache or mild pain.     allopurinol (ZYLOPRIM) 300 MG tablet Take 300 mg by mouth daily.  0   aspirin EC 81 MG tablet Take 1 tablet (81 mg total) by mouth daily.     Blood  Pressure Monitoring KIT 1 each by Does not apply route as directed. 1 kit 0   carvedilol (COREG) 3.125 MG tablet TAKE ONE TABLET BY MOUTH TWICE DAILY WITH MEALS (Patient taking differently: Take 3.125 mg by mouth 2 (two) times daily with a meal.) 180 tablet 2   clopidogrel (PLAVIX) 75 MG tablet TAKE ONE TABLET BY MOUTH DAILY (Patient taking differently: Take 75 mg by mouth daily.) 90 tablet 3   diazepam (VALIUM) 10 MG tablet Take 10 mg by mouth 2 (two) times daily.     FLOVENT HFA 220 MCG/ACT inhaler Inhale 1 puff into the lungs daily.      furosemide (LASIX) 40 MG tablet TAKE ONE TABLET BY MOUTH DAILY (Patient taking differently: Take 40 mg by mouth daily.) 90 tablet 3   isosorbide mononitrate (IMDUR) 30 MG 24 hr tablet Take 3 tablets (90 mg total) by mouth 2 (two) times daily. 540 tablet 3   metFORMIN (GLUCOPHAGE) 1000 MG tablet Take 1,000 mg by mouth daily with breakfast.     nitroGLYCERIN (NITROSTAT) 0.4 MG SL tablet PLACE 1 TABLET UNDER THE TONGUE AS NEEDED FOR CHEST PAIN. MAY REPEAT EVERY 5 MINUTES UP TO 3 DOSES. IF NO RELIEF AFTER 3 DOSES, CALL 911 OR (Patient taking differently: Place 0.4 mg under the tongue every 5 (five) minutes as needed for chest pain.) 25 tablet 0   omega-3 acid ethyl esters (LOVAZA) 1 g capsule Take 1 capsule by mouth every morning. & 2 capsules at night     ONETOUCH VERIO test strip      pantoprazole (PROTONIX) 40 MG tablet Take 40 mg by mouth daily.      pravastatin (PRAVACHOL) 40 MG tablet Take 1 tablet (40 mg total) by mouth daily. 90 tablet 3   ranolazine (RANEXA) 1000 MG SR tablet TAKE ONE TABLET BY MOUTH 2 TIMES A DAY (Patient taking differently: Take 1,000 mg by mouth 2 (two) times daily.) 180 tablet 3   No current facility-administered medications for this visit.   Allergies:  Patient has no known allergies.   ROS: No orthopnea or PND.  Physical Exam: VS:  BP 110/72    Pulse 61    Ht 5' 4"  (1.626 m)    Wt 208 lb 12.8 oz (94.7 kg)    SpO2 98%    BMI 35.84  kg/m , BMI Body mass index is 35.84 kg/m.  Wt Readings from Last 3 Encounters:  10/24/21 208 lb 12.8 oz (94.7 kg)  07/21/21 204 lb (92.5 kg)  07/02/21 202 lb 1.6 oz (91.7 kg)    General: Patient appears comfortable at rest. HEENT: Conjunctiva and lids normal, wearing a mask. Neck: Supple, no elevated JVP or carotid bruits, no thyromegaly. Lungs: Clear to auscultation, nonlabored breathing at rest. Cardiac: Regular rate and rhythm, no S3 or significant systolic murmur, no pericardial rub. Extremities: No pitting edema.  ECG:  An ECG dated 07/01/2021 was personally reviewed today and demonstrated:  Sinus bradycardia with old inferior infarct pattern.  Recent Labwork: 07/01/2021: BUN 22; Creatinine,  Ser 1.49; Potassium 4.2; Sodium 134 07/02/2021: Hemoglobin 12.3; Platelets 191     Component Value Date/Time   CHOL 95 07/02/2021 0020   TRIG 129 07/02/2021 0020   HDL 32 (L) 07/02/2021 0020   CHOLHDL 3.0 07/02/2021 0020   VLDL 26 07/02/2021 0020   LDLCALC 37 07/02/2021 0020    Other Studies Reviewed Today:  Cardiac catheterization 08/19/2020: Conclusions: Severe native coronary artery disease, including 60% LMCA disease, chronic total occlusions of ostial LAD and mid RCA, and 80% proximal ramus intermedius stenosis. Widely patent LIMA-LAD. Patent SVG-D1 with mild to moderate in-stent restenosis in the proximal and distal graft stents. Chronically occluded SVG-RCA. Low normal left ventricular systolic function with mid anterior hypokinesis. Mildly to moderately elevated left ventricular filling pressure.   Recommendations: Continue aggressive medical therapy.  Appearance of native coronary arteries and bypasses is not significantly different compared with prior catheterization in 11/2015. Escalate diuresis, as worsening HFpEF may be contributing to the patient's symptoms. Aggressive secondary prevention of coronary artery disease.  Echocardiogram 07/02/2021:  1. Overall EF  preserved inferior basal hypokinesis . Left ventricular  ejection fraction, by estimation, is 55%. The left ventricle has normal  function. The left ventricle demonstrates regional wall motion  abnormalities (see scoring diagram/findings for  description). Left ventricular diastolic parameters were normal.   2. Right ventricular systolic function is normal. The right ventricular  size is normal. There is normal pulmonary artery systolic pressure.   3. Left atrial size was mildly dilated.   4. Right atrial size was mildly dilated.   5. The mitral valve is normal in structure. No evidence of mitral valve  regurgitation. No evidence of mitral stenosis.   6. Sclerosis particulary the non coronary cusp no stenosis . The aortic  valve is tricuspid. There is moderate calcification of the aortic valve.  Aortic valve regurgitation is not visualized. No aortic stenosis is  present.   7. The inferior vena cava is normal in size with greater than 50%  respiratory variability, suggesting right atrial pressure of 3 mmHg.   Assessment and Plan:  1.  Multivessel CAD with patent LIMA to LAD, patent SVG to first diagonal with mild to moderate in-stent restenosis in the proximal and distal graft stents, also chronically occluded SVG to RCA.  Angina symptoms are stable on medical therapy.  Continue aspirin, Plavix, Coreg, Pravachol, Imdur, Ranexa, and Lovaza.  2.  Ischemic cardiomyopathy, LVEF 55% by most recent evaluation in September 2022 representing improvement over prior baseline.  3.  Longstanding tobacco abuse.  4.  Mixed hyperlipidemia, continues on Pravachol.  Last LDL 37.  Medication Adjustments/Labs and Tests Ordered: Current medicines are reviewed at length with the patient today.  Concerns regarding medicines are outlined above.   Tests Ordered: No orders of the defined types were placed in this encounter.   Medication Changes: No orders of the defined types were placed in this  encounter.   Disposition:  Follow up  6 months.  Signed, Satira Sark, MD, [facility] 10/24/2021 3:39 PM    [facility] [place] [facility] [place], [place], [place] [address] Phone: [phone]; Fax: [phone]

## 2021-10-24 NOTE — Patient Instructions (Signed)
Medication Instructions:  Your physician recommends that you continue on your current medications as directed. Please refer to the Current Medication list given to you today.  *If you need a refill on your cardiac medications before your next appointment, please call your pharmacy*   Lab Work: None If you have labs (blood work) drawn today and your tests are completely normal, you will receive your results only by: MyChart Message (if you have MyChart) OR A paper copy in the mail If you have any lab test that is abnormal or we need to change your treatment, we will call you to review the results.   Testing/Procedures: Non   Follow-Up: At [facility], you and your health needs are our priority.  As part of our continuing mission to provide you with exceptional heart care, we have created designated Provider Care Teams.  These Care Teams include your primary Cardiologist (physician) and Advanced Practice Providers (APPs -  Physician Assistants and Nurse Practitioners) who all work together to provide you with the care you need, when you need it.  We recommend signing up for the patient portal called "MyChart".  Sign up information is provided on this After Visit Summary.  MyChart is used to connect with patients for Virtual Visits (Telemedicine).  Patients are able to view lab/test results, encounter notes, upcoming appointments, etc.  Non-urgent messages can be sent to your provider as well.   To learn more about what you can do with MyChart, go to [email or web address].    Your next appointment:   6 month(s)  The format for your next appointment:   In Person  Provider:   Nona Dell, MD    Other Instructions

## 2021-12-19 DIAGNOSIS — Z0000 Encounter for general adult medical examination without abnormal findings: Secondary | ICD-10-CM | POA: Diagnosis not present

## 2021-12-19 DIAGNOSIS — K219 Gastro-esophageal reflux disease without esophagitis: Secondary | ICD-10-CM | POA: Diagnosis not present

## 2021-12-19 DIAGNOSIS — E1143 Type 2 diabetes mellitus with diabetic autonomic (poly)neuropathy: Secondary | ICD-10-CM | POA: Diagnosis not present

## 2021-12-19 DIAGNOSIS — I10 Essential (primary) hypertension: Secondary | ICD-10-CM | POA: Diagnosis not present

## 2021-12-19 DIAGNOSIS — J449 Chronic obstructive pulmonary disease, unspecified: Secondary | ICD-10-CM | POA: Diagnosis not present

## 2022-01-06 DIAGNOSIS — F17290 Nicotine dependence, other tobacco product, uncomplicated: Secondary | ICD-10-CM | POA: Diagnosis not present

## 2022-01-06 DIAGNOSIS — J449 Chronic obstructive pulmonary disease, unspecified: Secondary | ICD-10-CM | POA: Diagnosis not present

## 2022-01-06 DIAGNOSIS — Z79899 Other long term (current) drug therapy: Secondary | ICD-10-CM | POA: Diagnosis not present

## 2022-01-06 DIAGNOSIS — M109 Gout, unspecified: Secondary | ICD-10-CM | POA: Diagnosis not present

## 2022-01-06 DIAGNOSIS — I10 Essential (primary) hypertension: Secondary | ICD-10-CM | POA: Diagnosis not present

## 2022-01-06 DIAGNOSIS — R531 Weakness: Secondary | ICD-10-CM | POA: Diagnosis not present

## 2022-01-06 DIAGNOSIS — Z7982 Long term (current) use of aspirin: Secondary | ICD-10-CM | POA: Diagnosis not present

## 2022-01-06 DIAGNOSIS — I2510 Atherosclerotic heart disease of native coronary artery without angina pectoris: Secondary | ICD-10-CM | POA: Diagnosis not present

## 2022-01-06 DIAGNOSIS — N289 Disorder of kidney and ureter, unspecified: Secondary | ICD-10-CM | POA: Diagnosis not present

## 2022-01-06 DIAGNOSIS — Z7984 Long term (current) use of oral hypoglycemic drugs: Secondary | ICD-10-CM | POA: Diagnosis not present

## 2022-01-06 DIAGNOSIS — E119 Type 2 diabetes mellitus without complications: Secondary | ICD-10-CM | POA: Diagnosis not present

## 2022-01-16 DIAGNOSIS — J449 Chronic obstructive pulmonary disease, unspecified: Secondary | ICD-10-CM | POA: Diagnosis not present

## 2022-01-16 DIAGNOSIS — E876 Hypokalemia: Secondary | ICD-10-CM | POA: Diagnosis not present

## 2022-01-26 ENCOUNTER — Other Ambulatory Visit: Payer: Self-pay | Admitting: Cardiology

## 2022-03-09 ENCOUNTER — Other Ambulatory Visit: Payer: Self-pay | Admitting: Cardiology

## 2022-03-20 DIAGNOSIS — J449 Chronic obstructive pulmonary disease, unspecified: Secondary | ICD-10-CM | POA: Diagnosis not present

## 2022-03-20 DIAGNOSIS — E876 Hypokalemia: Secondary | ICD-10-CM | POA: Diagnosis not present

## 2022-03-20 DIAGNOSIS — Z0000 Encounter for general adult medical examination without abnormal findings: Secondary | ICD-10-CM | POA: Diagnosis not present

## 2022-04-24 ENCOUNTER — Encounter: Payer: Self-pay | Admitting: Cardiology

## 2022-04-24 ENCOUNTER — Ambulatory Visit (INDEPENDENT_AMBULATORY_CARE_PROVIDER_SITE_OTHER): Payer: Medicare Other | Admitting: Cardiology

## 2022-04-24 VITALS — BP 148/68 | HR 62 | Ht 64.0 in | Wt 207.0 lb

## 2022-04-24 DIAGNOSIS — I255 Ischemic cardiomyopathy: Secondary | ICD-10-CM

## 2022-04-24 DIAGNOSIS — I25119 Atherosclerotic heart disease of native coronary artery with unspecified angina pectoris: Secondary | ICD-10-CM

## 2022-04-24 DIAGNOSIS — E782 Mixed hyperlipidemia: Secondary | ICD-10-CM | POA: Diagnosis not present

## 2022-04-24 NOTE — Progress Notes (Signed)
Cardiology Office Note  Date: 04/24/2022   ID: [name] [name], DOB 07-09-1966, MRN [id number]  PCP:  [name], MD  Cardiologist:  [name], MD Electrophysiologist:  None   Chief Complaint  Patient presents with   Cardiac follow-up    History of Present Illness: [name] [name] is a 56 y.o. male last seen in January.  He is here for a routine visit.  He does not report any accelerating angina over the last 6 months, no nitroglycerin use.  Has been affected by the high heat and humidity as well as poor air quality recently.  Feels somewhat better today.  I reviewed his medications which are stable from a cardiac perspective and outlined below.  He does not report any spontaneous bleeding problems on aspirin and Plavix.  I did review interval lab work obtained back in March.  He continues to follow with PCP.  Past Medical History:  Diagnosis Date   Asthma    Bipolar disorder ([place])    Chronic back pain    Coronary atherosclerosis of native coronary artery    a. CABG 2003. b. NSTEMI 2010 s/p BMS to SVG-RCA c. PTCA/stent/asp thromb to SVG-RCA 01/2010, PTCA to SVG-RCA 04/2010 d. known occlusion of SVG-RCA 2013. e. NSTEMI 07/2013  s/p PTCA/DES to SVG-diagonal/intermediate/OM1.    Essential hypertension    GERD (gastroesophageal reflux disease)    Gout    Ischemic cardiomyopathy    a. EF 35-40% by cath 07/2013.   Mixed hyperlipidemia    Sinus bradycardia    Type 2 diabetes mellitus [facility])     Past Surgical History:  Procedure Laterality Date   CARDIAC CATHETERIZATION  06/19/2014   Procedure: CORONARY STENT INTERVENTION;  Surgeon: Leonie Man, MD;  Location: [facility] CATH LAB;  Service: Cardiovascular;;  DES to seg SVG Diag/OM/Ramus    CARDIAC CATHETERIZATION N/A 11/22/2015   Procedure: Left Heart Cath and Coronary Angiography;  Surgeon: Belva Crome, MD;  Location: [place] CV LAB;  Service: Cardiovascular;  Laterality: N/A;   CARDIAC CATHETERIZATION  08/19/2020    CHOLECYSTECTOMY     CORONARY ARTERY BYPASS GRAFT     2003, LIMA to LAD; SVG to diagonal; SVG to OM, ramus, and diagonal; SVG to RCA   LEFT HEART CATH AND CORS/GRAFTS ANGIOGRAPHY N/A 08/19/2020   Procedure: LEFT HEART CATH AND CORS/GRAFTS ANGIOGRAPHY;  Surgeon: Nelva Bush, MD;  Location: [place] CV LAB;  Service: Cardiovascular;  Laterality: N/A;   LEFT HEART CATHETERIZATION WITH CORONARY ANGIOGRAM N/A 01/28/2015   Procedure: LEFT HEART CATHETERIZATION WITH CORONARY ANGIOGRAM;  Surgeon: Leonie Man, MD;  Location: [facility] CATH LAB;  Service: Cardiovascular;  Laterality: N/A;   LEFT HEART CATHETERIZATION WITH CORONARY/GRAFT ANGIOGRAM N/A 07/14/2013   Procedure: LEFT HEART CATHETERIZATION WITH Beatrix Fetters;  Surgeon: Burnell Blanks, MD;  Location: [facility] CATH LAB;  Service: Cardiovascular;  Laterality: N/A;   LEFT HEART CATHETERIZATION WITH CORONARY/GRAFT ANGIOGRAM N/A 12/29/2013   Procedure: LEFT HEART CATHETERIZATION WITH Beatrix Fetters;  Surgeon: Leonie Man, MD;  Location: [facility] CATH LAB;  Service: Cardiovascular;  Laterality: N/A;   LEFT HEART CATHETERIZATION WITH CORONARY/GRAFT ANGIOGRAM N/A 06/19/2014   Procedure: LEFT HEART CATHETERIZATION WITH Beatrix Fetters;  Surgeon: Leonie Man, MD;  Location: [facility] CATH LAB;  Service: Cardiovascular;  Laterality: N/A;   Metal plate to chin     PERCUTANEOUS CORONARY STENT INTERVENTION (PCI-S)  07/14/2013   Procedure: PERCUTANEOUS CORONARY STENT INTERVENTION (PCI-S);  Surgeon: Burnell Blanks, MD;  Location:  [facility] CATH LAB;  Service: Cardiovascular;;   Pin placement left leg      Current Outpatient Medications  Medication Sig Dispense Refill   acetaminophen (TYLENOL) 325 MG tablet Take 2 tablets (650 mg total) by mouth every 4 (four) hours as needed for headache or mild pain.     allopurinol (ZYLOPRIM) 300 MG tablet Take 300 mg by mouth daily.  0   aspirin EC 81 MG tablet Take 1 tablet (81 mg total) by  mouth daily.     Blood Pressure Monitoring KIT 1 each by Does not apply route as directed. 1 kit 0   carvedilol (COREG) 3.125 MG tablet TAKE ONE TABLET BY MOUTH TWICE DAILY WITH MEALS 180 tablet 2   clopidogrel (PLAVIX) 75 MG tablet TAKE ONE TABLET BY MOUTH DAILY 90 tablet 3   diazepam (VALIUM) 10 MG tablet Take 10 mg by mouth 2 (two) times daily.     FLOVENT HFA 220 MCG/ACT inhaler Inhale 1 puff into the lungs daily.      furosemide (LASIX) 40 MG tablet TAKE ONE TABLET BY MOUTH DAILY (Patient taking differently: Take 40 mg by mouth daily.) 90 tablet 3   isosorbide mononitrate (IMDUR) 30 MG 24 hr tablet Take 3 tablets (90 mg total) by mouth 2 (two) times daily. 540 tablet 3   metFORMIN (GLUCOPHAGE) 1000 MG tablet Take 1,000 mg by mouth daily with breakfast.     nitroGLYCERIN (NITROSTAT) 0.4 MG SL tablet PLACE 1 TABLET UNDER THE TONGUE AS NEEDED FOR CHEST PAIN. MAY REPEAT EVERY 5 MINUTES UP TO 3 DOSES. IF NO RELIEF AFTER 3 DOSES, CALL 911 OR (Patient taking differently: Place 0.4 mg under the tongue every 5 (five) minutes as needed for chest pain.) 25 tablet 0   omega-3 acid ethyl esters (LOVAZA) 1 g capsule Take 1 capsule by mouth every morning. & 2 capsules at night     ONETOUCH VERIO test strip      pantoprazole (PROTONIX) 40 MG tablet Take 40 mg by mouth daily.      potassium chloride (KLOR-CON M) 10 MEQ tablet Take 10 mEq by mouth daily.     pravastatin (PRAVACHOL) 40 MG tablet Take 1 tablet (40 mg total) by mouth daily. 90 tablet 3   ranolazine (RANEXA) 1000 MG SR tablet TAKE ONE TABLET BY MOUTH 2 TIMES A DAY 180 tablet 3   No current facility-administered medications for this visit.   Allergies:  Patient has no known allergies.   ROS: No palpitations or syncope.  Physical Exam: VS:  BP (!) 148/68 (BP Location: Right Arm, Patient Position: Sitting, Cuff Size: Large)   Pulse 62   Ht 5\' 4"  (1.626 m)   Wt 207 lb (93.9 kg)   SpO2 93%   BMI 35.53 kg/m , BMI Body mass index is 35.53  kg/m.  Wt Readings from Last 3 Encounters:  04/24/22 207 lb (93.9 kg)  10/24/21 208 lb 12.8 oz (94.7 kg)  07/21/21 204 lb (92.5 kg)    General: Patient appears comfortable at rest. HEENT: Conjunctiva and lids normal, oropharynx clear. Neck: Supple, no elevated JVP or carotid bruits, no thyromegaly. Lungs: Clear to auscultation, nonlabored breathing at rest. Cardiac: Regular rate and rhythm, no S3 or significant systolic murmur. Extremities: No pitting edema.  ECG:  An ECG dated 10/24/2021 was personally reviewed today and demonstrated:  Sinus bradycardia with old inferior infarct pattern.  Recent Labwork: 07/01/2021: BUN 22; Creatinine, Ser 1.49; Potassium 4.2; Sodium 134 07/02/2021: Hemoglobin 12.3; Platelets 191  Component Value Date/Time   CHOL 95 07/02/2021 0020   TRIG 129 07/02/2021 0020   HDL 32 (L) 07/02/2021 0020   CHOLHDL 3.0 07/02/2021 0020   VLDL 26 07/02/2021 0020   LDLCALC 37 07/02/2021 0020  March 2023: Hemoglobin 13.9, platelets 210, potassium 4.0, BUN 13, creatinine 1.66, AST 14, ALT 20  Other Studies Reviewed Today:  Cardiac catheterization 08/19/2020: Conclusions: Severe native coronary artery disease, including 60% LMCA disease, chronic total occlusions of ostial LAD and mid RCA, and 80% proximal ramus intermedius stenosis. Widely patent LIMA-LAD. Patent SVG-D1 with mild to moderate in-stent restenosis in the proximal and distal graft stents. Chronically occluded SVG-RCA. Low normal left ventricular systolic function with mid anterior hypokinesis. Mildly to moderately elevated left ventricular filling pressure.   Recommendations: Continue aggressive medical therapy.  Appearance of native coronary arteries and bypasses is not significantly different compared with prior catheterization in 11/2015. Escalate diuresis, as worsening HFpEF may be contributing to the patient's symptoms. Aggressive secondary prevention of coronary artery disease.    Echocardiogram 07/02/2021:  1. Overall EF preserved inferior basal hypokinesis . Left ventricular  ejection fraction, by estimation, is 55%. The left ventricle has normal  function. The left ventricle demonstrates regional wall motion  abnormalities (see scoring diagram/findings for  description). Left ventricular diastolic parameters were normal.   2. Right ventricular systolic function is normal. The right ventricular  size is normal. There is normal pulmonary artery systolic pressure.   3. Left atrial size was mildly dilated.   4. Right atrial size was mildly dilated.   5. The mitral valve is normal in structure. No evidence of mitral valve  regurgitation. No evidence of mitral stenosis.   6. Sclerosis particulary the non coronary cusp no stenosis . The aortic  valve is tricuspid. There is moderate calcification of the aortic valve.  Aortic valve regurgitation is not visualized. No aortic stenosis is  present.   7. The inferior vena cava is normal in size with greater than 50%  respiratory variability, suggesting right atrial pressure of 3 mmHg.   Assessment and Plan:  1.  Multivessel CAD status post CABG with patent LIMA to LAD, patent SVG to first diagonal with mild to moderate in-stent restenosis in the proximal and distal graft stents, also chronically occluded SVG to RCA.  We are managing medically and he does not report any progressive angina at this time.  Continue aspirin, Plavix, Coreg, Imdur, Ranexa, and Pravachol.  No change in current regimen.  2.  HFimpEF with known ischemic cardiomyopathy and improvement in LVEF approximately 55% by last assessment.  3.  Longstanding tobacco use, has not been able to quit over time.  Medication Adjustments/Labs and Tests Ordered: Current medicines are reviewed at length with the patient today.  Concerns regarding medicines are outlined above.   Tests Ordered: No orders of the defined types were placed in this  encounter.   Medication Changes: No orders of the defined types were placed in this encounter.   Disposition:  Follow up  6 months.  Signed, Satira Sark, MD, [facility] 04/24/2022 3:58 PM    [place] [facility] [place], [place], [place] [address] Phone: [phone]; Fax: [phone]

## 2022-04-24 NOTE — Patient Instructions (Signed)

## 2022-05-30 ENCOUNTER — Other Ambulatory Visit: Payer: Self-pay | Admitting: *Deleted

## 2022-05-30 MED ORDER — FUROSEMIDE 40 MG PO TABS: 40.0000 mg | ORAL_TABLET | Freq: Every day | ORAL | 3 refills | Status: DC

## 2022-06-19 DIAGNOSIS — E876 Hypokalemia: Secondary | ICD-10-CM | POA: Diagnosis not present

## 2022-06-19 DIAGNOSIS — E1143 Type 2 diabetes mellitus with diabetic autonomic (poly)neuropathy: Secondary | ICD-10-CM | POA: Diagnosis not present

## 2022-06-19 DIAGNOSIS — M1009 Idiopathic gout, multiple sites: Secondary | ICD-10-CM | POA: Diagnosis not present

## 2022-06-19 DIAGNOSIS — J449 Chronic obstructive pulmonary disease, unspecified: Secondary | ICD-10-CM | POA: Diagnosis not present

## 2022-06-19 DIAGNOSIS — Z0000 Encounter for general adult medical examination without abnormal findings: Secondary | ICD-10-CM | POA: Diagnosis not present

## 2022-06-19 DIAGNOSIS — N1831 Chronic kidney disease, stage 3a: Secondary | ICD-10-CM | POA: Diagnosis not present

## 2022-06-19 DIAGNOSIS — D631 Anemia in chronic kidney disease: Secondary | ICD-10-CM | POA: Diagnosis not present

## 2022-06-19 DIAGNOSIS — I10 Essential (primary) hypertension: Secondary | ICD-10-CM | POA: Diagnosis not present

## 2022-06-20 ENCOUNTER — Telehealth: Payer: Self-pay | Admitting: Cardiology

## 2022-06-20 NOTE — Telephone Encounter (Signed)
Pt c/o medication issue:  1. Name of Medication:  isosorbide mononitrate (IMDUR) 30 MG 24 hr tablet  2. How are you currently taking this medication (dosage and times per day)? Not as prescribed  3. Are you having a reaction (difficulty breathing--STAT)?  No  4. What is your medication issue?   Patient called stating he has been taking the wrong dose of this medication and would like someone to call him back to discuss this.

## 2022-06-20 NOTE — Telephone Encounter (Signed)
Left message to return call 

## 2022-06-21 NOTE — Telephone Encounter (Signed)
Patient called for clarification on how he should be taking his isosorbide. Informed patient that per his last office visit with Dr. Diona Browner on 04/24/22, he is taking isosorbide mononitrate 30 mg, 3 tablets by mouth twice a day. Verbalized understanding.

## 2022-07-10 ENCOUNTER — Other Ambulatory Visit: Payer: Self-pay | Admitting: Cardiology

## 2022-07-25 DIAGNOSIS — I10 Essential (primary) hypertension: Secondary | ICD-10-CM | POA: Diagnosis not present

## 2022-07-25 DIAGNOSIS — G4733 Obstructive sleep apnea (adult) (pediatric): Secondary | ICD-10-CM | POA: Diagnosis not present

## 2022-07-25 DIAGNOSIS — G4700 Insomnia, unspecified: Secondary | ICD-10-CM | POA: Diagnosis not present

## 2022-08-28 ENCOUNTER — Other Ambulatory Visit: Payer: Self-pay | Admitting: Cardiology

## 2022-09-19 DIAGNOSIS — M1009 Idiopathic gout, multiple sites: Secondary | ICD-10-CM | POA: Diagnosis not present

## 2022-09-19 DIAGNOSIS — N1831 Chronic kidney disease, stage 3a: Secondary | ICD-10-CM | POA: Diagnosis not present

## 2022-09-19 DIAGNOSIS — I10 Essential (primary) hypertension: Secondary | ICD-10-CM | POA: Diagnosis not present

## 2022-09-19 DIAGNOSIS — J449 Chronic obstructive pulmonary disease, unspecified: Secondary | ICD-10-CM | POA: Diagnosis not present

## 2022-09-19 DIAGNOSIS — D631 Anemia in chronic kidney disease: Secondary | ICD-10-CM | POA: Diagnosis not present

## 2022-09-19 DIAGNOSIS — E1143 Type 2 diabetes mellitus with diabetic autonomic (poly)neuropathy: Secondary | ICD-10-CM | POA: Diagnosis not present

## 2022-09-26 DIAGNOSIS — F17210 Nicotine dependence, cigarettes, uncomplicated: Secondary | ICD-10-CM | POA: Diagnosis not present

## 2022-09-26 DIAGNOSIS — Z122 Encounter for screening for malignant neoplasm of respiratory organs: Secondary | ICD-10-CM | POA: Diagnosis not present

## 2022-09-27 ENCOUNTER — Other Ambulatory Visit: Payer: Self-pay | Admitting: Cardiology

## 2022-10-12 DIAGNOSIS — N2889 Other specified disorders of kidney and ureter: Secondary | ICD-10-CM | POA: Diagnosis not present

## 2022-10-12 DIAGNOSIS — R319 Hematuria, unspecified: Secondary | ICD-10-CM | POA: Diagnosis not present

## 2022-10-16 DIAGNOSIS — H35033 Hypertensive retinopathy, bilateral: Secondary | ICD-10-CM | POA: Diagnosis not present

## 2022-10-25 ENCOUNTER — Ambulatory Visit: Payer: Medicare Other | Admitting: Cardiology

## 2022-10-27 ENCOUNTER — Ambulatory Visit: Payer: 59 | Attending: Cardiology | Admitting: Nurse Practitioner

## 2022-10-27 ENCOUNTER — Encounter: Payer: Self-pay | Admitting: Nurse Practitioner

## 2022-10-27 VITALS — BP 110/70 | HR 71 | Ht 64.0 in | Wt 209.8 lb

## 2022-10-27 DIAGNOSIS — E669 Obesity, unspecified: Secondary | ICD-10-CM

## 2022-10-27 DIAGNOSIS — I10 Essential (primary) hypertension: Secondary | ICD-10-CM | POA: Diagnosis not present

## 2022-10-27 DIAGNOSIS — I2510 Atherosclerotic heart disease of native coronary artery without angina pectoris: Secondary | ICD-10-CM | POA: Diagnosis not present

## 2022-10-27 DIAGNOSIS — I5020 Unspecified systolic (congestive) heart failure: Secondary | ICD-10-CM

## 2022-10-27 DIAGNOSIS — I5022 Chronic systolic (congestive) heart failure: Secondary | ICD-10-CM | POA: Diagnosis not present

## 2022-10-27 DIAGNOSIS — Z720 Tobacco use: Secondary | ICD-10-CM

## 2022-10-27 DIAGNOSIS — I255 Ischemic cardiomyopathy: Secondary | ICD-10-CM

## 2022-10-27 DIAGNOSIS — E782 Mixed hyperlipidemia: Secondary | ICD-10-CM

## 2022-10-27 MED ORDER — NITROGLYCERIN 0.4 MG SL SUBL: SUBLINGUAL_TABLET | SUBLINGUAL | 3 refills | Status: DC

## 2022-10-27 MED ORDER — NITROGLYCERIN 0.4 MG SL SUBL: 0.4000 mg | SUBLINGUAL_TABLET | SUBLINGUAL | 3 refills | Status: DC | PRN

## 2022-10-27 NOTE — Patient Instructions (Addendum)
Medication Instructions:  Your physician recommends that you continue on your current medications as directed. Please refer to the Current Medication list given to you today.  Labwork: none  Testing/Procedures: none  Follow-Up: Your physician recommends that you schedule a follow-up appointment in: 6 months with Dr. McDowell  Any Other Special Instructions Will Be Listed Below (If Applicable).  If you need a refill on your cardiac medications before your next appointment, please call your pharmacy. 

## 2022-10-27 NOTE — Progress Notes (Signed)
Cardiology Office Note:    Date:  10/27/2022   ID:  [name] [name], DOB 15-Nov-1965, MRN [id number]  PCP:  Toma Deiters, MD   [facility] Providers Cardiologist:  Nona Dell, MD     Referring MD: Toma Deiters, MD   CC: Here for follow-up  History of Present Illness:    [name] [name] is a 57 y.o. male with a hx of the following:  CAD, s/p CABG Heart failure improved ejection fraction, ischemic cardiomyopathy Sinus bradycardia Tobacco abuse Mixed hyperlipidemia Type 2 diabetes Hypertension Asthma GERD Bipolar disorder  Patient is a 57 year old male with past medical history as mentioned above.  History of remote CABG in 2003, s/p NSTEMI in 2010 and received bare-metal stent to SVG-RCA.  PTCA/stent/asp thromb to SVG-RCA in 2011, several months later in 2011 received PTCA to SVG-RCA, known occlusion of SVG-RCA in 2013.  Had NSTEMI in 2014, status post PTCA/DES to SVG-diagonal/intermediate/OM1.  Last seen by Dr. Diona Browner on April 24, 2022.  Was overall doing well from a cardiac perspective.  Was compliant with his medications.  Multivessel CAD was being managed medically.  Last EF seen via echocardiogram was 55%.  Was still smoking at the time.  No medication changes were made.  Was told to follow-up in 6 months.  Today he presents for 32-month follow-up.  He states he is doing well.  Notes having chest pain, "once in a blue moon."  Last time he had chest pain was months ago.  Denies any recent chest pains.  Difficult for him to describe.  Did not have to use nitroglycerin.  Denies any shortness of breath. Denies any shortness of breath, palpitations, syncope, presyncope, dizziness, orthopnea, PND, swelling or significant weight changes, acute bleeding, or claudication.  Currently smoking 1 pack/day.  Denies any other questions or concerns today.   Past Medical History:  Diagnosis Date   Asthma    Bipolar disorder (HCC)    Chronic back pain    Coronary  atherosclerosis of native coronary artery    a. CABG 2003. b. NSTEMI 2010 s/p BMS to SVG-RCA c. PTCA/stent/asp thromb to SVG-RCA 01/2010, PTCA to SVG-RCA 04/2010 d. known occlusion of SVG-RCA 2013. e. NSTEMI 07/2013  s/p PTCA/DES to SVG-diagonal/intermediate/OM1.    Essential hypertension    GERD (gastroesophageal reflux disease)    Gout    Ischemic cardiomyopathy    a. EF 35-40% by cath 07/2013.   Mixed hyperlipidemia    Sinus bradycardia    Type 2 diabetes mellitus [facility])     Past Surgical History:  Procedure Laterality Date   CARDIAC CATHETERIZATION  06/19/2014   Procedure: CORONARY STENT INTERVENTION;  Surgeon: Marykay Lex, MD;  Location: [facility] CATH LAB;  Service: Cardiovascular;;  DES to seg SVG Diag/OM/Ramus    CARDIAC CATHETERIZATION N/A 11/22/2015   Procedure: Left Heart Cath and Coronary Angiography;  Surgeon: Lyn Records, MD;  Location: [facility] INVASIVE CV LAB;  Service: Cardiovascular;  Laterality: N/A;   CARDIAC CATHETERIZATION  08/19/2020   CHOLECYSTECTOMY     CORONARY ARTERY BYPASS GRAFT     2003, LIMA to LAD; SVG to diagonal; SVG to OM, ramus, and diagonal; SVG to RCA   LEFT HEART CATH AND CORS/GRAFTS ANGIOGRAPHY N/A 08/19/2020   Procedure: LEFT HEART CATH AND CORS/GRAFTS ANGIOGRAPHY;  Surgeon: Yvonne Kendall, MD;  Location: [facility] INVASIVE CV LAB;  Service: Cardiovascular;  Laterality: N/A;   LEFT HEART CATHETERIZATION WITH CORONARY ANGIOGRAM N/A 01/28/2015   Procedure: LEFT HEART CATHETERIZATION WITH  CORONARY ANGIOGRAM;  Surgeon: Leonie Man, MD;  Location: [facility] CATH LAB;  Service: Cardiovascular;  Laterality: N/A;   LEFT HEART CATHETERIZATION WITH CORONARY/GRAFT ANGIOGRAM N/A 07/14/2013   Procedure: LEFT HEART CATHETERIZATION WITH Beatrix Fetters;  Surgeon: Burnell Blanks, MD;  Location: [facility] CATH LAB;  Service: Cardiovascular;  Laterality: N/A;   LEFT HEART CATHETERIZATION WITH CORONARY/GRAFT ANGIOGRAM N/A 12/29/2013   Procedure: LEFT HEART CATHETERIZATION WITH  Beatrix Fetters;  Surgeon: Leonie Man, MD;  Location: [facility] CATH LAB;  Service: Cardiovascular;  Laterality: N/A;   LEFT HEART CATHETERIZATION WITH CORONARY/GRAFT ANGIOGRAM N/A 06/19/2014   Procedure: LEFT HEART CATHETERIZATION WITH Beatrix Fetters;  Surgeon: Leonie Man, MD;  Location: [facility] CATH LAB;  Service: Cardiovascular;  Laterality: N/A;   Metal plate to chin     PERCUTANEOUS CORONARY STENT INTERVENTION (PCI-S)  07/14/2013   Procedure: PERCUTANEOUS CORONARY STENT INTERVENTION (PCI-S);  Surgeon: Burnell Blanks, MD;  Location: [facility] CATH LAB;  Service: Cardiovascular;;   Pin placement left leg      Current Medications: Current Meds  Medication Sig   acetaminophen (TYLENOL) 325 MG tablet Take 2 tablets (650 mg total) by mouth every 4 (four) hours as needed for headache or mild pain.   allopurinol (ZYLOPRIM) 300 MG tablet Take 300 mg by mouth daily.   aspirin EC 81 MG tablet Take 1 tablet (81 mg total) by mouth daily.   Blood Pressure Monitoring KIT 1 each by Does not apply route as directed.   carvedilol (COREG) 3.125 MG tablet TAKE ONE TABLET BY MOUTH TWICE DAILY WITH MEALS   clopidogrel (PLAVIX) 75 MG tablet TAKE ONE TABLET BY MOUTH DAILY   diazepam (VALIUM) 10 MG tablet Take 10 mg by mouth 2 (two) times daily.   FLOVENT HFA 220 MCG/ACT inhaler Inhale 1 puff into the lungs daily.    furosemide (LASIX) 40 MG tablet Take 1 tablet (40 mg total) by mouth daily.   isosorbide mononitrate (IMDUR) 30 MG 24 hr tablet TAKE THREE TABLETS BY MOUTH TWICE DAILY   metFORMIN (GLUCOPHAGE) 1000 MG tablet Take 1,000 mg by mouth daily with breakfast.   omega-3 acid ethyl esters (LOVAZA) 1 g capsule Take 1 capsule by mouth every morning. & 2 capsules at night   ONETOUCH VERIO test strip    pantoprazole (PROTONIX) 40 MG tablet Take 40 mg by mouth daily.    potassium chloride (KLOR-CON M) 10 MEQ tablet Take 10 mEq by mouth daily.   pravastatin (PRAVACHOL) 40 MG tablet TAKE ONE  TABLET BY MOUTH EVERY DAY   ranolazine (RANEXA) 1000 MG SR tablet TAKE ONE TABLET BY MOUTH 2 TIMES A DAY   nitroGLYCERIN (NITROSTAT) 0.4 MG SL tablet PLACE 1 TABLET UNDER THE TONGUE AS NEEDED FOR CHEST PAIN. MAY REPEAT EVERY 5 MINUTES UP TO 3 DOSES. IF NO RELIEF AFTER 3 DOSES, CALL 911 OR (Patient taking differently: Place 0.4 mg under the tongue every 5 (five) minutes as needed for chest pain.)     Allergies:   Patient has no known allergies.   Social History   Socioeconomic History   Marital status: Married    Spouse name: Not on file   Number of children: Not on file   Years of education: Not on file   Highest education level: Not on file  Occupational History   Not on file  Tobacco Use   Smoking status: Every Day    Packs/day: 0.80    Years: 33.00    Total pack years: 26.40  Types: Cigarettes    Start date: 07/01/1978   Smokeless tobacco: Never   Tobacco comments:    trying to quit, 3/4 pack per day. Started the nicotine patch on 01-10-14-   Vaping Use   Vaping Use: Never used  Substance and Sexual Activity   Alcohol use: Not Currently    Comment: drink a can of beer occasionally every year    Drug use: Yes    Frequency: 2.0 times per week    Types: Marijuana   Sexual activity: Yes    Partners: Female  Other Topics Concern   Not on file  Social History Narrative   Married.    Social Determinants of Health   Financial Resource Strain: Not on file  Food Insecurity: Not on file  Transportation Needs: Not on file  Physical Activity: Not on file  Stress: Not on file  Social Connections: Not on file     Family History: The patient's family history includes Bowel Disease in his mother; Hernia in his father; Pneumonia in his daughter; Stroke in an other family member.  ROS:   Review of Systems  Constitutional: Negative.   HENT: Negative.    Eyes: Negative.   Respiratory: Negative.    Cardiovascular:  Positive for chest pain. Negative for palpitations,  orthopnea, claudication, leg swelling and PND.  Gastrointestinal: Negative.   Genitourinary: Negative.   Musculoskeletal: Negative.   Skin: Negative.   Neurological: Negative.   Endo/Heme/Allergies: Negative.   Psychiatric/Behavioral: Negative.      Please see the history of present illness.    All other systems reviewed and are negative.  EKGs/Labs/Other Studies Reviewed:    The following studies were reviewed today:   EKG:  EKG is ordered today.  The ekg ordered today demonstrates normal sinus rhythm, 71 bpm, old inferior infarct, seen on previous EKG, otherwise nothing acute.   Echo limited on 07/02/2021: 1. Overall EF preserved inferior basal hypokinesis . Left ventricular  ejection fraction, by estimation, is 55%. The left ventricle has normal  function. The left ventricle demonstrates regional wall motion  abnormalities (see scoring diagram/findings for  description). Left ventricular diastolic parameters were normal.   2. Right ventricular systolic function is normal. The right ventricular  size is normal. There is normal pulmonary artery systolic pressure.   3. Left atrial size was mildly dilated.   4. Right atrial size was mildly dilated.   5. The mitral valve is normal in structure. No evidence of mitral valve  regurgitation. No evidence of mitral stenosis.   6. Sclerosis particulary the non coronary cusp no stenosis . The aortic  valve is tricuspid. There is moderate calcification of the aortic valve.  Aortic valve regurgitation is not visualized. No aortic stenosis is  present.   7. The inferior vena cava is normal in size with greater than 50%  respiratory variability, suggesting right atrial pressure of 3 mmHg.  Left heart cath on 08/19/2020: Conclusions: Severe native coronary artery disease, including 60% LMCA disease, chronic total occlusions of ostial LAD and mid RCA, and 80% proximal ramus intermedius stenosis. Widely patent LIMA-LAD. Patent SVG-D1 with  mild to moderate in-stent restenosis in the proximal and distal graft stents. Chronically occluded SVG-RCA. Low normal left ventricular systolic function with mid anterior hypokinesis. Mildly to moderately elevated left ventricular filling pressure.   Recommendations: Continue aggressive medical therapy.  Appearance of native coronary arteries and bypasses is not significantly different compared with prior catheterization in 11/2015. Escalate diuresis, as worsening HFpEF may be contributing  to the patient's symptoms. Aggressive secondary prevention of coronary artery disease.   Echocardiogram on 08/19/2020:  1. Left ventricular ejection fraction, by estimation, is 55 to 60%. The  left ventricle has normal function. The left ventricle demonstrates  regional wall motion abnormalities (see scoring diagram/findings for  description). There is mild left ventricular   hypertrophy. Left ventricular diastolic parameters were normal.   2. Right ventricular systolic function is normal. The right ventricular  size is normal. There is normal pulmonary artery systolic pressure. The  estimated right ventricular systolic pressure is 73.2 mmHg.   3. The mitral valve is grossly normal. Trivial mitral valve  regurgitation.   4. The aortic valve is tricuspid. Aortic valve regurgitation is not  visualized. Mild aortic valve sclerosis is present, with no evidence of  aortic valve stenosis.   5. The inferior vena cava is dilated in size with >50% respiratory  variability, suggesting right atrial pressure of 8 mmHg.   Left heart cath on 11/22/2015: Ramus lesion, 95% stenosed.   Saphenous vein bypass graft disease with total occlusion of the SVG to the distal RCA. The sequential SVG to the diagonal and ramus intermedius is disease with occlusion of the limb to the ramus intermedius. The side-to-side anastomosis on the diagonal remains patent. The stents in the ostium and distal saphenous vein graft sites are  widely patent. Patent LIMA to LAD Total occlusion of the native right. The distal right coronary/PDA fills by left-to-right collaterals from the LAD. Segmental diffuse disease in the left main. Total occlusion of the LAD. High-grade obstruction in the circumflex and ramus intermedius originating at the left main. Widely patent LIMA to the LAD When compared to the prior angiographic images, no significant change has occurred. Inferior wall hypokinesis, estimated ejection fraction 45%   Recommendations:   Continue medical management. No significant change in anatomy when compared to 01/28/2015.   Myoview on 06/17/2014: IMPRESSION:  1. Inferolateral defect as outlined consistent with scarring and  mild to moderate peri-infarct ischemia.   2. Global LV hypokinesis, most prominent in the inferior wall.   3. Left ventricular ejection fraction 40%   4. Intermediate-risk stress test findings*.   Recent Labs: No results found for requested labs within last 365 days.  Recent Lipid Panel    Component Value Date/Time   CHOL 95 07/02/2021 0020   TRIG 129 07/02/2021 0020   HDL 32 (L) 07/02/2021 0020   CHOLHDL 3.0 07/02/2021 0020   VLDL 26 07/02/2021 0020   LDLCALC 37 07/02/2021 0020    Physical Exam:    VS:  BP 110/70   Pulse 71   Ht 5\' 4"  (1.626 m)   Wt 209 lb 12.8 oz (95.2 kg)   SpO2 97%   BMI 36.01 kg/m     Wt Readings from Last 3 Encounters:  10/27/22 209 lb 12.8 oz (95.2 kg)  04/24/22 207 lb (93.9 kg)  10/24/21 208 lb 12.8 oz (94.7 kg)     GEN: Obese, 58 y.o. male in no acute distress HEENT: Normal NECK: No JVD; No carotid bruits CARDIAC: S1/S2, RRR, no murmurs, rubs, gallops; 2+ pulses RESPIRATORY:  Clear to auscultation without rales, wheezing or rhonchi, strong/nonproductive smoker's cough MUSCULOSKELETAL:  No edema; No deformity  SKIN: Warm and dry NEUROLOGIC:  Alert and oriented x 3 PSYCHIATRIC:  Normal affect   ASSESSMENT:    1. Coronary artery disease  involving native heart without angina pectoris, unspecified vessel or lesion type   2. Heart failure with improved ejection  fraction (HFimpEF) (HCC)   3. Ischemic cardiomyopathy   4. Mixed hyperlipidemia   5. Essential hypertension, benign   6. Obesity (BMI 30-39.9)   7. Tobacco abuse    PLAN:    In order of problems listed above:  CAD, s/p CABG Cardiac catheterization in 2021 revealed severe CAD, CTO of ostial LAD and mRCA, 60% LMCA disease, 80% proximal ramus intermedius stenosis, widely patent LIMA-LAD and patent SVG-D1 with mild to moderate in-stent restenosis in proximal and distal graft stents, chronically occluded SVG-RCA, mildly to moderately elevated LVEDP, low normal LV SF with mild anterior hypokinesis.  Aggressive medical therapy recommended with secondary prevention of CAD and escalation in diuresis recommended.  No recent chest pains, very atypical chest pain in the past.  Will continue to monitor for now.  Continue aspirin, carvedilol, isosorbide mononitrate, Lovaza, Ranexa, pravastatin, and nitroglycerin as needed.  ED precautions discussed. Heart healthy diet and regular cardiovascular exercise encouraged.  Will refill nitroglycerin upon his request.  HFimpEF, Ischemic CM Echocardiogram in 2022 showed normal EF, regional wall motion abnormalities noted left ventricle with normal left ventricular diastolic parameters. Euvolemic and well compensated on exam.  Continue current medication regimen. Low sodium diet, fluid restriction <2L, and daily weights encouraged. Educated to contact our office for weight gain of 2 lbs overnight or 5 lbs in one week. Heart healthy diet and regular cardiovascular exercise encouraged.   Mixed HLD Lipid panel in September 23 was unremarkable.  Continue pravastatin.  Continue rest of current medication regimen. Heart healthy diet and regular cardiovascular exercise encouraged.   HTN Blood pressure stable today.  BP well-controlled at home. Discussed  to monitor BP at home at least 2 hours after medications and sitting for 5-10 minutes.  Continue current medication regimen. Heart healthy diet and regular cardiovascular exercise encouraged.   5.  Obesity BMI today 36.01. Weight loss via diet and exercise encouraged. Discussed the impact being overweight would have on cardiovascular risk.  At next OV, plan to discuss PREP program.  Tobacco abuse Smoking 1 pack/day.  Motivational interviewing performed.  Smoking cessation discussed and encouraged today.  6.  Disposition: Follow-up with Dr. Diona Browner in 6 months or sooner if anything changes.  Medication Adjustments/Labs and Tests Ordered: Current medicines are reviewed at length with the patient today.  Concerns regarding medicines are outlined above.  Orders Placed This Encounter  Procedures   EKG 12-Lead   Meds ordered this encounter  Medications   DISCONTD: nitroGLYCERIN (NITROSTAT) 0.4 MG SL tablet    Sig: PLACE 1 TABLET UNDER THE TONGUE AS NEEDED FOR CHEST PAIN. MAY REPEAT EVERY 5 MINUTES UP TO 3 DOSES. IF NO RELIEF AFTER 3 DOSES, CALL 911 OR Strength: 0.4 mg    Dispense:  25 tablet    Refill:  3   nitroGLYCERIN (NITROSTAT) 0.4 MG SL tablet    Sig: Place 1 tablet (0.4 mg total) under the tongue every 5 (five) minutes x 3 doses as needed for chest pain (if no relief after 3rd dose, proceed to ED or call 911).    Dispense:  25 tablet    Refill:  3    Patient Instructions  Medication Instructions:  Your physician recommends that you continue on your current medications as directed. Please refer to the Current Medication list given to you today.  Labwork: none  Testing/Procedures: none  Follow-Up: Your physician recommends that you schedule a follow-up appointment in: 6 months with Dr. Diona Browner  Any Other Special Instructions Will Be Listed Below (If  Applicable).  If you need a refill on your cardiac medications before your next appointment, please call your pharmacy.    Signed, Sharlene Dory, NP  10/27/2022 2:56 PM    [facility]

## 2022-10-30 DIAGNOSIS — K5730 Diverticulosis of large intestine without perforation or abscess without bleeding: Secondary | ICD-10-CM | POA: Diagnosis not present

## 2022-10-30 DIAGNOSIS — N281 Cyst of kidney, acquired: Secondary | ICD-10-CM | POA: Diagnosis not present

## 2022-10-30 DIAGNOSIS — N2889 Other specified disorders of kidney and ureter: Secondary | ICD-10-CM | POA: Diagnosis not present

## 2022-10-30 DIAGNOSIS — I700 Atherosclerosis of aorta: Secondary | ICD-10-CM | POA: Diagnosis not present

## 2022-12-07 DIAGNOSIS — M1009 Idiopathic gout, multiple sites: Secondary | ICD-10-CM | POA: Diagnosis not present

## 2022-12-07 DIAGNOSIS — E1143 Type 2 diabetes mellitus with diabetic autonomic (poly)neuropathy: Secondary | ICD-10-CM | POA: Diagnosis not present

## 2022-12-07 DIAGNOSIS — I700 Atherosclerosis of aorta: Secondary | ICD-10-CM | POA: Diagnosis not present

## 2022-12-07 DIAGNOSIS — J449 Chronic obstructive pulmonary disease, unspecified: Secondary | ICD-10-CM | POA: Diagnosis not present

## 2022-12-07 DIAGNOSIS — N1831 Chronic kidney disease, stage 3a: Secondary | ICD-10-CM | POA: Diagnosis not present

## 2022-12-07 DIAGNOSIS — I10 Essential (primary) hypertension: Secondary | ICD-10-CM | POA: Diagnosis not present

## 2022-12-07 DIAGNOSIS — E1121 Type 2 diabetes mellitus with diabetic nephropathy: Secondary | ICD-10-CM | POA: Diagnosis not present

## 2022-12-19 DIAGNOSIS — Z0000 Encounter for general adult medical examination without abnormal findings: Secondary | ICD-10-CM | POA: Diagnosis not present

## 2022-12-19 DIAGNOSIS — E1143 Type 2 diabetes mellitus with diabetic autonomic (poly)neuropathy: Secondary | ICD-10-CM | POA: Diagnosis not present

## 2022-12-19 DIAGNOSIS — M1009 Idiopathic gout, multiple sites: Secondary | ICD-10-CM | POA: Diagnosis not present

## 2022-12-19 DIAGNOSIS — J449 Chronic obstructive pulmonary disease, unspecified: Secondary | ICD-10-CM | POA: Diagnosis not present

## 2022-12-19 DIAGNOSIS — I10 Essential (primary) hypertension: Secondary | ICD-10-CM | POA: Diagnosis not present

## 2022-12-19 DIAGNOSIS — E1121 Type 2 diabetes mellitus with diabetic nephropathy: Secondary | ICD-10-CM | POA: Diagnosis not present

## 2022-12-19 DIAGNOSIS — D631 Anemia in chronic kidney disease: Secondary | ICD-10-CM | POA: Diagnosis not present

## 2022-12-19 DIAGNOSIS — I700 Atherosclerosis of aorta: Secondary | ICD-10-CM | POA: Diagnosis not present

## 2022-12-19 DIAGNOSIS — N1831 Chronic kidney disease, stage 3a: Secondary | ICD-10-CM | POA: Diagnosis not present

## 2023-02-06 ENCOUNTER — Other Ambulatory Visit: Payer: Self-pay | Admitting: Cardiology

## 2023-02-21 ENCOUNTER — Other Ambulatory Visit: Payer: Self-pay | Admitting: Cardiology

## 2023-03-20 DIAGNOSIS — I700 Atherosclerosis of aorta: Secondary | ICD-10-CM | POA: Diagnosis not present

## 2023-03-20 DIAGNOSIS — I10 Essential (primary) hypertension: Secondary | ICD-10-CM | POA: Diagnosis not present

## 2023-03-20 DIAGNOSIS — D631 Anemia in chronic kidney disease: Secondary | ICD-10-CM | POA: Diagnosis not present

## 2023-03-20 DIAGNOSIS — J449 Chronic obstructive pulmonary disease, unspecified: Secondary | ICD-10-CM | POA: Diagnosis not present

## 2023-03-20 DIAGNOSIS — M1009 Idiopathic gout, multiple sites: Secondary | ICD-10-CM | POA: Diagnosis not present

## 2023-03-20 DIAGNOSIS — E1143 Type 2 diabetes mellitus with diabetic autonomic (poly)neuropathy: Secondary | ICD-10-CM | POA: Diagnosis not present

## 2023-03-20 DIAGNOSIS — E1121 Type 2 diabetes mellitus with diabetic nephropathy: Secondary | ICD-10-CM | POA: Diagnosis not present

## 2023-03-20 DIAGNOSIS — N1831 Chronic kidney disease, stage 3a: Secondary | ICD-10-CM | POA: Diagnosis not present

## 2023-03-20 DIAGNOSIS — M5432 Sciatica, left side: Secondary | ICD-10-CM | POA: Diagnosis not present

## 2023-04-30 ENCOUNTER — Encounter: Payer: Self-pay | Admitting: Cardiology

## 2023-04-30 ENCOUNTER — Ambulatory Visit: Payer: 59 | Attending: Cardiology | Admitting: Cardiology

## 2023-04-30 VITALS — BP 126/72 | HR 70 | Ht 64.0 in | Wt 214.4 lb

## 2023-04-30 DIAGNOSIS — I25119 Atherosclerotic heart disease of native coronary artery with unspecified angina pectoris: Secondary | ICD-10-CM

## 2023-04-30 DIAGNOSIS — E782 Mixed hyperlipidemia: Secondary | ICD-10-CM

## 2023-04-30 MED ORDER — ISOSORBIDE MONONITRATE ER 30 MG PO TB24: 60.0000 mg | ORAL_TABLET | Freq: Two times a day (BID) | ORAL | 3 refills | Status: DC

## 2023-04-30 NOTE — Patient Instructions (Addendum)

## 2023-04-30 NOTE — Progress Notes (Signed)
    Cardiology Office Note  Date: 04/30/2023   ID: [name] [name], DOB 1966-05-04, MRN [id number]  History of Present Illness: [name] [name] is a 57 y.o. male last seen in January by Ms. [name] Nettle NP, I reviewed the note.  He is here for a routine visit.  Reports stable angina on current regimen, no increasing nitroglycerin use or worsening dyspnea on exertion, although the high heat and humidity have been a challenge.  He paces himself and tries to pick the cooler parts of the day.  I reviewed his medications.  He has been taking Imdur 60 mg twice daily, less than prior.  No other changes.  His last LDL was 67 in March.  I reviewed his ECG from January.  Physical Exam: VS:  BP 126/72   Pulse 70   Ht 5\' 4"  (1.626 m)   Wt 214 lb 6.4 oz (97.3 kg)   SpO2 98%   BMI 36.80 kg/m , BMI Body mass index is 36.8 kg/m.  Wt Readings from Last 3 Encounters:  04/30/23 214 lb 6.4 oz (97.3 kg)  10/27/22 209 lb 12.8 oz (95.2 kg)  04/24/22 207 lb (93.9 kg)    General: Patient appears comfortable at rest. HEENT: Conjunctiva and lids normal. Neck: Supple, no elevated JVP or carotid bruits. Lungs: Clear to auscultation, nonlabored breathing at rest. Cardiac: Regular rate and rhythm, no S3 or significant systolic murmur. Extremities: No pitting edema.  ECG:  An ECG dated 10/27/2022 was personally reviewed today and demonstrated:  Sinus rhythm with old inferior infarct pattern.  Labwork:  March 2024: BUN 19, creatinine 1.47, potassium 5.1, cholesterol 141, triglycerides 207, HDL 40, LDL 67  Other Studies Reviewed Today:  No interval cardiac testing for review today.  Assessment and Plan:  1.  Multivessel CAD status post CABG in 2003 with LIMA to LAD, SVG to diagonal, SVG to OM/ramus/diagonal, and SVG to RCA.  He underwent a BMS to the SVG to RCA in 2010, atherectomy and stent intervention to the SVG to RCA in 2011 followed by angioplasty of the SVG to RCA later that same year.  SVG to RCA  ultimately found to be occluded in 2013.  Most recently he underwent DES to the SVG to OM/ramus/diagonal in 2014.  Cardiac catheterization in 2021 revealed patent LIMA to LAD, patent SVG to diagonal with mild to moderate in-stent restenosis, chronically occluded SVG to RCA.  LVEF 55% by echocardiogram in 2022.  He reports stable angina at this time.  Continue aspirin, Plavix, Coreg, Imdur at 60 mg twice daily, Ranexa, and Pravachol.  2.  Essential hypertension.  Blood pressure is well-controlled today.  No changes were made.  3.  Mixed hyperlipidemia.  LDL 67 in March.  Continue Pravachol 40 mg daily.  4.  CKD stage IIIb, creatinine 1.47 in March.  Disposition:  Follow up  6 months.  Signed, Jonelle Sidle, M.D., F.A.C.C[facility]

## 2023-05-09 ENCOUNTER — Other Ambulatory Visit: Payer: Self-pay | Admitting: Cardiology

## 2023-05-16 ENCOUNTER — Emergency Department (HOSPITAL_COMMUNITY)
Admission: EM | Admit: 2023-05-16 | Discharge: 2023-05-16 | Disposition: A | Discharge status: Home / Self Care | Payer: 59 | Attending: Emergency Medicine | Admitting: Emergency Medicine

## 2023-05-16 ENCOUNTER — Emergency Department (HOSPITAL_COMMUNITY): Payer: 59

## 2023-05-16 ENCOUNTER — Other Ambulatory Visit: Payer: Self-pay

## 2023-05-16 ENCOUNTER — Encounter (HOSPITAL_COMMUNITY): Payer: Self-pay | Admitting: Emergency Medicine

## 2023-05-16 DIAGNOSIS — E119 Type 2 diabetes mellitus without complications: Secondary | ICD-10-CM | POA: Diagnosis not present

## 2023-05-16 DIAGNOSIS — Z79899 Other long term (current) drug therapy: Secondary | ICD-10-CM | POA: Diagnosis not present

## 2023-05-16 DIAGNOSIS — J449 Chronic obstructive pulmonary disease, unspecified: Secondary | ICD-10-CM | POA: Diagnosis not present

## 2023-05-16 DIAGNOSIS — Z7902 Long term (current) use of antithrombotics/antiplatelets: Secondary | ICD-10-CM | POA: Diagnosis not present

## 2023-05-16 DIAGNOSIS — Z7982 Long term (current) use of aspirin: Secondary | ICD-10-CM | POA: Diagnosis not present

## 2023-05-16 DIAGNOSIS — I2510 Atherosclerotic heart disease of native coronary artery without angina pectoris: Secondary | ICD-10-CM | POA: Diagnosis not present

## 2023-05-16 DIAGNOSIS — R072 Precordial pain: Secondary | ICD-10-CM | POA: Diagnosis not present

## 2023-05-16 DIAGNOSIS — R079 Chest pain, unspecified: Secondary | ICD-10-CM

## 2023-05-16 DIAGNOSIS — I10 Essential (primary) hypertension: Secondary | ICD-10-CM | POA: Diagnosis not present

## 2023-05-16 DIAGNOSIS — Z7984 Long term (current) use of oral hypoglycemic drugs: Secondary | ICD-10-CM | POA: Insufficient documentation

## 2023-05-16 DIAGNOSIS — R0602 Shortness of breath: Secondary | ICD-10-CM | POA: Diagnosis not present

## 2023-05-16 DIAGNOSIS — Z951 Presence of aortocoronary bypass graft: Secondary | ICD-10-CM | POA: Diagnosis not present

## 2023-05-16 DIAGNOSIS — J45909 Unspecified asthma, uncomplicated: Secondary | ICD-10-CM | POA: Insufficient documentation

## 2023-05-16 DIAGNOSIS — R0789 Other chest pain: Secondary | ICD-10-CM | POA: Diagnosis not present

## 2023-05-16 DIAGNOSIS — Z743 Need for continuous supervision: Secondary | ICD-10-CM | POA: Diagnosis not present

## 2023-05-16 LAB — CBC
HCT: 37.2 % — ABNORMAL LOW (ref 39.0–52.0)
Hemoglobin: 13.1 g/dL (ref 13.0–17.0)
MCH: 36.7 pg — ABNORMAL HIGH (ref 26.0–34.0)
MCHC: 35.2 g/dL (ref 30.0–36.0)
MCV: 104.2 fL — ABNORMAL HIGH (ref 80.0–100.0)
Platelets: 183 10*3/uL (ref 150–400)
RBC: 3.57 MIL/uL — ABNORMAL LOW (ref 4.22–5.81)
RDW: 13.8 % (ref 11.5–15.5)
WBC: 6.9 10*3/uL (ref 4.0–10.5)
nRBC: 0 % (ref 0.0–0.2)

## 2023-05-16 LAB — BASIC METABOLIC PANEL
Anion gap: 12 (ref 5–15)
BUN: 19 mg/dL (ref 6–20)
CO2: 23 mmol/L (ref 22–32)
Calcium: 8.9 mg/dL (ref 8.9–10.3)
Chloride: 98 mmol/L (ref 98–111)
Creatinine, Ser: 1.47 mg/dL — ABNORMAL HIGH (ref 0.61–1.24)
GFR, Estimated: 56 mL/min — ABNORMAL LOW (ref >60)
Glucose, Bld: 105 mg/dL — ABNORMAL HIGH (ref 70–99)
Potassium: 3.6 mmol/L (ref 3.5–5.1)
Sodium: 133 mmol/L — ABNORMAL LOW (ref 135–145)

## 2023-05-16 LAB — LIPASE, BLOOD: Lipase: 38 U/L (ref 11–51)

## 2023-05-16 LAB — TROPONIN I (HIGH SENSITIVITY)
Troponin I (High Sensitivity): 11 ng/L (ref <18)
Troponin I (High Sensitivity): 11 ng/L (ref <18)

## 2023-05-16 LAB — MAGNESIUM: Magnesium: 1.5 mg/dL — ABNORMAL LOW (ref 1.7–2.4)

## 2023-05-16 MED ORDER — ASPIRIN 81 MG PO CHEW
243.0000 mg | CHEWABLE_TABLET | Freq: Once | ORAL | Status: AC
  Administered 2023-05-16: 243 mg via ORAL
  Filled 2023-05-16: qty 3

## 2023-05-16 MED ORDER — MAGNESIUM SULFATE 2 GM/50ML IV SOLN
2.0000 g | Freq: Once | INTRAVENOUS | Status: AC
  Administered 2023-05-16: 2 g via INTRAVENOUS
  Filled 2023-05-16: qty 50

## 2023-05-16 MED ORDER — FENTANYL CITRATE PF 50 MCG/ML IJ SOSY
50.0000 ug | PREFILLED_SYRINGE | Freq: Once | INTRAMUSCULAR | Status: AC
  Administered 2023-05-16: 50 ug via INTRAVENOUS
  Filled 2023-05-16: qty 1

## 2023-05-16 NOTE — ED Provider Notes (Signed)
[place] EMERGENCY DEPARTMENT AT [facility] Provider Note   CSN: [id number] Arrival date & time: 05/16/23  0050     History  Chief Complaint  Patient presents with   Chest Pain    [name] [name] is a 57 y.o. male.   Chest Pain Patient presents for chest pain.  Medical history includes CAD, HLD, HTN, DM, GERD, gout, asthma.  He is followed by [facility] ([facility]  He was last seen in cardiology office 2 weeks ago.  He had CABG in 2003.  He has since had multiple stents placed.  Last cath was 3 years ago.  This evening, patient noticed pain in his left elbow which then migrated to chest.  He had associated shortness of breath.  Pain is reminiscent of prior MIs.  Earlier this evening, patient was at rest when he experienced pain in the area of his left elbow.  Pain subsequently migrated to his substernal area.  At maximum, it was 7/10 in severity.  Currently, it is 4/10 in severity.  He did take his evening medicines which includes 81 mg aspirin.  Patient denies any current nausea or shortness of breath.     Home Medications Prior to Admission medications   Medication Sig Start Date End Date Taking? Authorizing Provider  acetaminophen (TYLENOL) 325 MG tablet Take 2 tablets (650 mg total) by mouth every 4 (four) hours as needed for headache or mild pain. 06/20/14   Abelino Derrick, PA-C  allopurinol (ZYLOPRIM) 300 MG tablet Take 300 mg by mouth daily. 11/03/15   [provider]  aspirin EC 81 MG tablet Take 1 tablet (81 mg total) by mouth daily. 12/05/12   Rande Brunt, PA-C  Blood Pressure Monitoring KIT 1 each by Does not apply route as directed. 09/29/14   Jonelle Sidle, MD  carvedilol (COREG) 3.125 MG tablet TAKE ONE TABLET BY MOUTH TWICE DAILY WITH MEALS 09/27/22   Jonelle Sidle, MD  clopidogrel (PLAVIX) 75 MG tablet TAKE ONE TABLET BY MOUTH DAILY 02/06/23   Jonelle Sidle, MD  diazepam (VALIUM) 10 MG tablet Take 10 mg by mouth 2 (two) times daily.     [provider]  FLOVENT HFA 220 MCG/ACT inhaler Inhale 1 puff into the lungs daily.  10/10/17   [provider]  furosemide (LASIX) 40 MG tablet TAKE ONE TABLET BY MOUTH DAILY 05/09/23   Jonelle Sidle, MD  isosorbide mononitrate (IMDUR) 30 MG 24 hr tablet Take 2 tablets (60 mg total) by mouth 2 (two) times daily. 04/30/23   Jonelle Sidle, MD  metFORMIN (GLUCOPHAGE) 1000 MG tablet Take 1,000 mg by mouth daily with breakfast.    [provider]  nitroGLYCERIN (NITROSTAT) 0.4 MG SL tablet Place 1 tablet (0.4 mg total) under the tongue every 5 (five) minutes x 3 doses as needed for chest pain (if no relief after 3rd dose, proceed to ED or call 911). 10/27/22   Jonelle Sidle, MD  omega-3 acid ethyl esters (LOVAZA) 1 g capsule Take 1 capsule by mouth every morning. & 2 capsules at night 08/18/19   [provider]  [facility] VERIO test strip  10/10/17   [provider]  pantoprazole (PROTONIX) 40 MG tablet Take 40 mg by mouth daily.     [provider]  potassium chloride (KLOR-CON M) 10 MEQ tablet Take 10 mEq by mouth daily. 01/16/22   [provider]  pravastatin (PRAVACHOL) 40 MG tablet TAKE ONE TABLET BY MOUTH  EVERY DAY 07/10/22   Jonelle Sidle, MD  ranolazine (RANEXA) 1000 MG SR tablet TAKE ONE TABLET BY MOUTH 2 TIMES A DAY 02/21/23   Jonelle Sidle, MD      Allergies    Patient has no known allergies.    Review of Systems   Review of Systems  Cardiovascular:  Positive for chest pain.  Musculoskeletal:  Positive for arthralgias.  All other systems reviewed and are negative.   Physical Exam Updated Vital Signs BP 101/67 (BP Location: Left Arm)   Pulse 66   Temp (!) 97.4 F (36.3 C) (Oral)   Resp 16   Ht 5\' 4"  (1.626 m)   Wt 95.7 kg   SpO2 90%   BMI 36.22 kg/m  Physical Exam Vitals and nursing note reviewed.  Constitutional:      General: He is not in acute distress.    Appearance: He is well-developed. He  is not ill-appearing, toxic-appearing or diaphoretic.  HENT:     Head: Normocephalic and atraumatic.  Eyes:     Conjunctiva/sclera: Conjunctivae normal.  Cardiovascular:     Rate and Rhythm: Normal rate and regular rhythm.     Heart sounds: No murmur heard. Pulmonary:     Effort: Pulmonary effort is normal. No respiratory distress.     Breath sounds: Normal breath sounds.  Chest:     Chest wall: Tenderness present.  Abdominal:     Palpations: Abdomen is soft.     Tenderness: There is no abdominal tenderness.  Musculoskeletal:        General: No swelling. Normal range of motion.     Cervical back: Normal range of motion and neck supple.     Right lower leg: No edema.     Left lower leg: No edema.  Skin:    General: Skin is warm and dry.  Neurological:     General: No focal deficit present.     Mental Status: He is alert and oriented to person, place, and time.  Psychiatric:        Mood and Affect: Mood normal.        Behavior: Behavior normal.     ED Results / Procedures / Treatments   Labs (all labs ordered are listed, but only abnormal results are displayed) Labs Reviewed  BASIC METABOLIC PANEL - Abnormal; Notable for the following components:      Result Value   Sodium 133 (*)    Glucose, Bld 105 (*)    Creatinine, Ser 1.47 (*)    GFR, Estimated 56 (*)    All other components within normal limits  CBC - Abnormal; Notable for the following components:   RBC 3.57 (*)    HCT 37.2 (*)    MCV 104.2 (*)    MCH 36.7 (*)    All other components within normal limits  MAGNESIUM - Abnormal; Notable for the following components:   Magnesium 1.5 (*)    All other components within normal limits  LIPASE, BLOOD  TROPONIN I (HIGH SENSITIVITY)  TROPONIN I (HIGH SENSITIVITY)    EKG EKG Interpretation Date/Time:  Wednesday May 16 2023 00:59:10 EDT Ventricular Rate:  67 PR Interval:  197 QRS Duration:  86 QT Interval:  461 QTC Calculation: 487 R Axis:   67  Text  Interpretation: Sinus rhythm Abnormal R-wave progression, early transition Probable inferior infarct, old Lateral leads are also involved When compared with ECG of 07/02/2021, HEART RATE has increased Confirmed by Dione Booze ([id number]) on 05/16/2023  5:06:16 AM  Radiology DG Chest Portable 1 View  Result Date: 05/16/2023 CLINICAL DATA:  Chest pain and shortness of breath EXAM: PORTABLE CHEST 1 VIEW COMPARISON:  Lung cancer screening chest CT 09/26/2022 FINDINGS: The heart size and mediastinal contours are within normal limits. There are old CABG changes. Both lungs are Mildly emphysematous but clear. The visualized skeletal structures are intact. Multiple overlying monitor wires. IMPRESSION: No active disease.  Stable COPD chest.  Old CABG. Electronically Signed   By: Almira Bar M.D.   On: 05/16/2023 01:52    Procedures Procedures    Medications Ordered in ED Medications  aspirin chewable tablet 243 mg (243 mg Oral Given 05/16/23 0140)  fentaNYL (SUBLIMAZE) injection 50 mcg (50 mcg Intravenous Given 05/16/23 0139)  magnesium sulfate IVPB 2 g 50 mL (0 g Intravenous Stopped 05/16/23 0344)    ED Course/ Medical Decision Making/ A&P                                 Medical Decision Making Amount and/or Complexity of Data Reviewed Labs: ordered. Radiology: ordered.  Risk OTC drugs. Prescription drug management.   This patient presents to the ED for concern of chest pain, this involves an extensive number of treatment options, and is a complaint that carries with it a high risk of complications and morbidity.  The differential diagnosis includes ACS, pericarditis, GERD, pneumonia, anxiety   Co morbidities that complicate the patient evaluation  CAD, HLD, HTN, DM, GERD, gout, asthma   Additional history obtained:  Additional history obtained from N/A External records from outside source obtained and reviewed including MR   Lab Tests:  I Ordered, and personally interpreted labs.  The  pertinent results include: Baseline creatinine, hypomagnesemia with otherwise normal electrolytes, normal hemoglobin, no leukocytosis, normal troponins x 2   Imaging Studies ordered:  I ordered imaging studies including chest x-ray I independently visualized and interpreted imaging which showed no acute findings I agree with the radiologist interpretation   Cardiac Monitoring: / EKG:  The patient was maintained on a cardiac monitor.  I personally viewed and interpreted the cardiac monitored which showed an underlying rhythm of: Sinus rhythm  Problem List / ED Course / Critical interventions / Medication management  Patient presenting for chest pain.  He has a long cardiac history that includes CABG and multiple stents.  Per chart review, last cath was 3 years ago.  Earlier today, he was in his normal state of health.  This evening, while at rest, he had pain in his left elbow that then migrated to his substernal area.  He took his evening medications.  Since onset, pain has decreased and is currently 4/10 in severity.  On arrival in the ED, vital signs are normal.  Patient is well-appearing on exam.  He does have some very mild tenderness to his chest.  EKG does not show evidence of STEMI.  3 more baby aspirin were ordered.  Fentanyl was ordered for analgesia.  Workup was initiated.  Lab work is notable for hypomagnesemia.  Notably, patient has normal troponins x 2.  He had resolution of symptoms while in the ED.  While in the ED, he did have hypoxia while sleeping.  He does have history of COPD.  When awake, SpO2 was in the low 90s.  He denies any current shortness of breath.  Patient feels comfortable with discharge home and outpatient follow-up.  Cardiology referral was ordered.  He was advised to return for any recurrence of concerning symptoms.  He was discharged in stable condition. I ordered medication including ASA and fentanyl for chest pain; magnesium sulfate for  hypomagnesemia Reevaluation of the patient after these medicines showed that the patient resolved I have reviewed the patients home medicines and have made adjustments as needed   Social Determinants of Health:  Has access to outpatient care        Final Clinical Impression(s) / ED Diagnoses Final diagnoses:  Chest pain, unspecified type    Rx / DC Orders ED Discharge Orders          Ordered    Ambulatory referral to Cardiology       Comments: If you have not heard from the Cardiology office within the next 72 hours please call [phone].   05/16/23 [id number]              Gloris Manchester, MD 05/16/23 [phone]

## 2023-05-16 NOTE — Discharge Instructions (Signed)
Workup in the emergency department is reassuring.  A referral for cardiology was ordered.  You should hear from the office in the next couple days.  If you do not, call the telephone number below to set up a follow-up appointment.  Return to the emergency department for any new or worsening symptoms of concern.

## 2023-05-16 NOTE — ED Triage Notes (Signed)
Pt to ED via EMS from home c/o pain to left elbow 3 hours ago and then pain returned to chest and felt SOB.  Hx of cardiac cath and 5 stent placements and feels similar to last time he needed one.  Placed on 2L [place] by EMS for comfort.  NSR with EMS, 70 HR 126/84 BP 98% RA

## 2023-05-28 DIAGNOSIS — A681 Tick-borne relapsing fever: Secondary | ICD-10-CM | POA: Diagnosis not present

## 2023-06-19 DIAGNOSIS — I700 Atherosclerosis of aorta: Secondary | ICD-10-CM | POA: Diagnosis not present

## 2023-06-19 DIAGNOSIS — M1009 Idiopathic gout, multiple sites: Secondary | ICD-10-CM | POA: Diagnosis not present

## 2023-06-19 DIAGNOSIS — M5432 Sciatica, left side: Secondary | ICD-10-CM | POA: Diagnosis not present

## 2023-06-19 DIAGNOSIS — D631 Anemia in chronic kidney disease: Secondary | ICD-10-CM | POA: Diagnosis not present

## 2023-06-19 DIAGNOSIS — J449 Chronic obstructive pulmonary disease, unspecified: Secondary | ICD-10-CM | POA: Diagnosis not present

## 2023-06-19 DIAGNOSIS — I10 Essential (primary) hypertension: Secondary | ICD-10-CM | POA: Diagnosis not present

## 2023-06-19 DIAGNOSIS — E1143 Type 2 diabetes mellitus with diabetic autonomic (poly)neuropathy: Secondary | ICD-10-CM | POA: Diagnosis not present

## 2023-06-19 DIAGNOSIS — Z0000 Encounter for general adult medical examination without abnormal findings: Secondary | ICD-10-CM | POA: Diagnosis not present

## 2023-06-19 DIAGNOSIS — N1831 Chronic kidney disease, stage 3a: Secondary | ICD-10-CM | POA: Diagnosis not present

## 2023-06-19 DIAGNOSIS — E1121 Type 2 diabetes mellitus with diabetic nephropathy: Secondary | ICD-10-CM | POA: Diagnosis not present

## 2023-07-02 ENCOUNTER — Other Ambulatory Visit: Payer: Self-pay | Admitting: Cardiology

## 2023-07-19 DIAGNOSIS — Z955 Presence of coronary angioplasty implant and graft: Secondary | ICD-10-CM | POA: Diagnosis not present

## 2023-07-19 DIAGNOSIS — Z7902 Long term (current) use of antithrombotics/antiplatelets: Secondary | ICD-10-CM | POA: Diagnosis not present

## 2023-07-19 DIAGNOSIS — R55 Syncope and collapse: Secondary | ICD-10-CM | POA: Diagnosis not present

## 2023-07-19 DIAGNOSIS — R079 Chest pain, unspecified: Secondary | ICD-10-CM | POA: Diagnosis not present

## 2023-07-19 DIAGNOSIS — I10 Essential (primary) hypertension: Secondary | ICD-10-CM | POA: Diagnosis not present

## 2023-07-19 DIAGNOSIS — F17210 Nicotine dependence, cigarettes, uncomplicated: Secondary | ICD-10-CM | POA: Diagnosis not present

## 2023-07-19 DIAGNOSIS — R61 Generalized hyperhidrosis: Secondary | ICD-10-CM | POA: Diagnosis not present

## 2023-07-19 DIAGNOSIS — I2510 Atherosclerotic heart disease of native coronary artery without angina pectoris: Secondary | ICD-10-CM | POA: Diagnosis not present

## 2023-07-19 DIAGNOSIS — M109 Gout, unspecified: Secondary | ICD-10-CM | POA: Diagnosis not present

## 2023-07-19 DIAGNOSIS — R404 Transient alteration of awareness: Secondary | ICD-10-CM | POA: Diagnosis not present

## 2023-07-19 DIAGNOSIS — Z743 Need for continuous supervision: Secondary | ICD-10-CM | POA: Diagnosis not present

## 2023-07-19 DIAGNOSIS — I255 Ischemic cardiomyopathy: Secondary | ICD-10-CM | POA: Diagnosis not present

## 2023-07-19 DIAGNOSIS — I959 Hypotension, unspecified: Secondary | ICD-10-CM | POA: Diagnosis not present

## 2023-07-19 DIAGNOSIS — E860 Dehydration: Secondary | ICD-10-CM | POA: Diagnosis not present

## 2023-07-19 DIAGNOSIS — J449 Chronic obstructive pulmonary disease, unspecified: Secondary | ICD-10-CM | POA: Diagnosis not present

## 2023-07-19 DIAGNOSIS — R6889 Other general symptoms and signs: Secondary | ICD-10-CM | POA: Diagnosis not present

## 2023-07-19 DIAGNOSIS — E119 Type 2 diabetes mellitus without complications: Secondary | ICD-10-CM | POA: Diagnosis not present

## 2023-07-19 DIAGNOSIS — I2582 Chronic total occlusion of coronary artery: Secondary | ICD-10-CM | POA: Diagnosis not present

## 2023-07-19 DIAGNOSIS — R531 Weakness: Secondary | ICD-10-CM | POA: Diagnosis not present

## 2023-07-25 ENCOUNTER — Other Ambulatory Visit: Payer: Self-pay | Admitting: Cardiology

## 2023-07-30 DIAGNOSIS — E1121 Type 2 diabetes mellitus with diabetic nephropathy: Secondary | ICD-10-CM | POA: Diagnosis not present

## 2023-07-30 DIAGNOSIS — I952 Hypotension due to drugs: Secondary | ICD-10-CM | POA: Diagnosis not present

## 2023-09-18 DIAGNOSIS — M1009 Idiopathic gout, multiple sites: Secondary | ICD-10-CM | POA: Diagnosis not present

## 2023-09-18 DIAGNOSIS — I700 Atherosclerosis of aorta: Secondary | ICD-10-CM | POA: Diagnosis not present

## 2023-09-18 DIAGNOSIS — M5432 Sciatica, left side: Secondary | ICD-10-CM | POA: Diagnosis not present

## 2023-09-18 DIAGNOSIS — J449 Chronic obstructive pulmonary disease, unspecified: Secondary | ICD-10-CM | POA: Diagnosis not present

## 2023-09-18 DIAGNOSIS — I10 Essential (primary) hypertension: Secondary | ICD-10-CM | POA: Diagnosis not present

## 2023-09-18 DIAGNOSIS — D631 Anemia in chronic kidney disease: Secondary | ICD-10-CM | POA: Diagnosis not present

## 2023-09-18 DIAGNOSIS — E1122 Type 2 diabetes mellitus with diabetic chronic kidney disease: Secondary | ICD-10-CM | POA: Diagnosis not present

## 2023-09-18 DIAGNOSIS — N182 Chronic kidney disease, stage 2 (mild): Secondary | ICD-10-CM | POA: Diagnosis not present

## 2023-10-17 DIAGNOSIS — Z122 Encounter for screening for malignant neoplasm of respiratory organs: Secondary | ICD-10-CM | POA: Diagnosis not present

## 2023-10-17 DIAGNOSIS — R809 Proteinuria, unspecified: Secondary | ICD-10-CM | POA: Diagnosis not present

## 2023-10-17 DIAGNOSIS — F17210 Nicotine dependence, cigarettes, uncomplicated: Secondary | ICD-10-CM | POA: Diagnosis not present

## 2023-10-22 ENCOUNTER — Other Ambulatory Visit: Payer: Self-pay | Admitting: Cardiology

## 2023-10-29 ENCOUNTER — Ambulatory Visit: Payer: 59 | Admitting: Cardiology

## 2023-11-19 DIAGNOSIS — H43813 Vitreous degeneration, bilateral: Secondary | ICD-10-CM | POA: Diagnosis not present

## 2023-12-03 ENCOUNTER — Ambulatory Visit: Payer: 59 | Attending: Nurse Practitioner | Admitting: Nurse Practitioner

## 2023-12-03 ENCOUNTER — Encounter: Payer: Self-pay | Admitting: Nurse Practitioner

## 2023-12-03 VITALS — BP 116/60 | HR 62 | Ht 64.0 in | Wt 198.0 lb

## 2023-12-03 DIAGNOSIS — Z720 Tobacco use: Secondary | ICD-10-CM

## 2023-12-03 DIAGNOSIS — I10 Essential (primary) hypertension: Secondary | ICD-10-CM

## 2023-12-03 DIAGNOSIS — I2510 Atherosclerotic heart disease of native coronary artery without angina pectoris: Secondary | ICD-10-CM | POA: Diagnosis not present

## 2023-12-03 DIAGNOSIS — E782 Mixed hyperlipidemia: Secondary | ICD-10-CM

## 2023-12-03 DIAGNOSIS — E669 Obesity, unspecified: Secondary | ICD-10-CM

## 2023-12-03 DIAGNOSIS — I255 Ischemic cardiomyopathy: Secondary | ICD-10-CM | POA: Diagnosis not present

## 2023-12-03 DIAGNOSIS — I5032 Chronic diastolic (congestive) heart failure: Secondary | ICD-10-CM

## 2023-12-03 NOTE — Patient Instructions (Addendum)

## 2023-12-03 NOTE — Progress Notes (Signed)
 Cardiology Office Note:    Date:  12/03/2023 ID:  [name] [name], DOB 1966-09-09, MRN [id number] PCP:  Toma Deiters, MD [facility] Providers Cardiologist:  Nona Dell, MD    Referring MD: Toma Deiters, MD   CC: Here for 6 month follow-up  History of Present Illness:    [name] [name] is a 58 y.o. male with a PMH of CAD, s/p CABG in 2003, HFimpEF/ICM, hyperlipidemia, type 2 diabetes, hypertension, asthma, GERD, bipolar disorder, and tobacco abuse, who presents today for 37-month follow-up appointment.  History of remote CABG in 2003, s/p NSTEMI in 2010 and received bare-metal stent to SVG-RCA.  PTCA/stent/asp thromb to SVG-RCA in 2011, several months later in 2011 received PTCA to SVG-RCA, known occlusion of SVG-RCA in 2013.  Had NSTEMI in 2014, status post PTCA/DES to SVG-diagonal/intermediate/OM1.  Last seen by Dr. Diona Browner on April 30, 2023.  Denied any worsening dyspnea on exertion, did notice symptoms noted with current heat and humidity regarding the weather at the time. Overall was doing well.   Presents today for 79-month follow-up appointment.  Doing well.  Tells me Dr. Olena Leatherwood recently started him on losartan and wants to know if he should be starting this.  Also believes that he is taking lisinopril although I do not see this on his med list today. Overall doing well from a cardiac perspective. Denies any chest pain, worsening shortness of breath, palpitations, syncope, presyncope, dizziness, orthopnea, PND, swelling or significant weight changes, acute bleeding, or claudication.  Please see the history of present illness.    All other systems reviewed and are negative.  EKGs/Labs/Other Studies Reviewed:    The following studies were reviewed today:   EKG: EKG is not ordered today.  Echo limited on 07/02/2021: 1. Overall EF preserved inferior basal hypokinesis . Left ventricular  ejection fraction, by estimation, is 55%. The left ventricle has normal   function. The left ventricle demonstrates regional wall motion  abnormalities (see scoring diagram/findings for  description). Left ventricular diastolic parameters were normal.   2. Right ventricular systolic function is normal. The right ventricular  size is normal. There is normal pulmonary artery systolic pressure.   3. Left atrial size was mildly dilated.   4. Right atrial size was mildly dilated.   5. The mitral valve is normal in structure. No evidence of mitral valve  regurgitation. No evidence of mitral stenosis.   6. Sclerosis particulary the non coronary cusp no stenosis . The aortic  valve is tricuspid. There is moderate calcification of the aortic valve.  Aortic valve regurgitation is not visualized. No aortic stenosis is  present.   7. The inferior vena cava is normal in size with greater than 50%  respiratory variability, suggesting right atrial pressure of 3 mmHg.  Left heart cath on 08/19/2020: Conclusions: Severe native coronary artery disease, including 60% LMCA disease, chronic total occlusions of ostial LAD and mid RCA, and 80% proximal ramus intermedius stenosis. Widely patent LIMA-LAD. Patent SVG-D1 with mild to moderate in-stent restenosis in the proximal and distal graft stents. Chronically occluded SVG-RCA. Low normal left ventricular systolic function with mid anterior hypokinesis. Mildly to moderately elevated left ventricular filling pressure.   Recommendations: Continue aggressive medical therapy.  Appearance of native coronary arteries and bypasses is not significantly different compared with prior catheterization in 11/2015. Escalate diuresis, as worsening HFpEF may be contributing to the patient's symptoms. Aggressive secondary prevention of coronary artery disease.   Echocardiogram on 08/19/2020:  1. Left ventricular  ejection fraction, by estimation, is 55 to 60%. The  left ventricle has normal function. The left ventricle demonstrates  regional  wall motion abnormalities (see scoring diagram/findings for  description). There is mild left ventricular   hypertrophy. Left ventricular diastolic parameters were normal.   2. Right ventricular systolic function is normal. The right ventricular  size is normal. There is normal pulmonary artery systolic pressure. The  estimated right ventricular systolic pressure is 30.7 mmHg.   3. The mitral valve is grossly normal. Trivial mitral valve  regurgitation.   4. The aortic valve is tricuspid. Aortic valve regurgitation is not  visualized. Mild aortic valve sclerosis is present, with no evidence of  aortic valve stenosis.   5. The inferior vena cava is dilated in size with >50% respiratory  variability, suggesting right atrial pressure of 8 mmHg.   Left heart cath on 11/22/2015: Ramus lesion, 95% stenosed.   Saphenous vein bypass graft disease with total occlusion of the SVG to the distal RCA. The sequential SVG to the diagonal and ramus intermedius is disease with occlusion of the limb to the ramus intermedius. The side-to-side anastomosis on the diagonal remains patent. The stents in the ostium and distal saphenous vein graft sites are widely patent. Patent LIMA to LAD Total occlusion of the native right. The distal right coronary/PDA fills by left-to-right collaterals from the LAD. Segmental diffuse disease in the left main. Total occlusion of the LAD. High-grade obstruction in the circumflex and ramus intermedius originating at the left main. Widely patent LIMA to the LAD When compared to the prior angiographic images, no significant change has occurred. Inferior wall hypokinesis, estimated ejection fraction 45%   Recommendations:   Continue medical management. No significant change in anatomy when compared to 01/28/2015.   Myoview on 06/17/2014: IMPRESSION:  1. Inferolateral defect as outlined consistent with scarring and  mild to moderate peri-infarct ischemia.   2. Global LV  hypokinesis, most prominent in the inferior wall.   3. Left ventricular ejection fraction 40%   4. Intermediate-risk stress test findings*.   Physical Exam:    VS:  BP 116/60   Pulse 62   Ht 5\' 4"  (1.626 m)   Wt 198 lb (89.8 kg)   SpO2 93%   BMI 33.99 kg/m     Wt Readings from Last 3 Encounters:  12/03/23 198 lb (89.8 kg)  05/16/23 211 lb (95.7 kg)  04/30/23 214 lb 6.4 oz (97.3 kg)     GEN: Obese, 58 y.o. male in no acute distress HEENT: Normal NECK: No JVD; No carotid bruits CARDIAC: S1/S2, RRR, no murmurs, rubs, gallops; 2+ pulses RESPIRATORY:  Clear to auscultation without rales, wheezing or rhonchi, strong/nonproductive smoker's cough MUSCULOSKELETAL:  No edema; No deformity  SKIN: Warm and dry NEUROLOGIC:  Alert and oriented x 3 PSYCHIATRIC:  Normal affect   ASSESSMENT & PLAN:    In order of problems listed above:  CAD, s/p CABG Cardiac catheterization in 2021 revealed severe CAD, CTO of ostial LAD and mRCA, 60% LMCA disease, 80% proximal ramus intermedius stenosis, widely patent LIMA-LAD and patent SVG-D1 with mild to moderate in-stent restenosis in proximal and distal graft stents, chronically occluded SVG-RCA, mildly to moderately elevated LVEDP, low normal LV SF with mild anterior hypokinesis.  Aggressive medical therapy recommended with secondary prevention of CAD and escalation in diuresis recommended. Stable with no anginal symptoms. No indication for ischemic evaluation.  Continue aspirin, carvedilol, isosorbide mononitrate, Ranexa, pravastatin, and nitroglycerin as needed.  Will confirm with his  pharmacy to see if he is taking lisinopril.  ED precautions discussed. Heart healthy diet and regular cardiovascular exercise encouraged.    HFimpEF, Ischemic CM Stage C, NYHA class I-II symptoms.  EF 55% in September 2022.  Echocardiogram showed normal EF, regional wall motion abnormalities noted left ventricle with normal left ventricular diastolic parameters. Euvolemic  and well compensated on exam.  Continue current medication regimen. Low sodium diet, fluid restriction <2L, and daily weights encouraged. Educated to contact our office for weight gain of 2 lbs overnight or 5 lbs in one week. Heart healthy diet and regular cardiovascular exercise encouraged.   Mixed HLD LDL 74 from 06/2023.  Continue pravastatin.  Continue rest of current medication regimen. Heart healthy diet and regular cardiovascular exercise encouraged. At next office visit, plan to request labs if available and start Zetia if needed to further lower LDL.   HTN Blood pressure stable today.  BP well-controlled at home. Discussed to monitor BP at home at least 2 hours after medications and sitting for 5-10 minutes.  Continue current medication regimen. Heart healthy diet and regular cardiovascular exercise encouraged.   5.  Obesity Weight loss via diet and exercise encouraged. Discussed the impact being overweight would have on cardiovascular risk.  At next OV, plan to discuss PREP program.  Tobacco abuse Smoking 1 pack/day.  Motivational interviewing performed.  Smoking cessation discussed and encouraged today.  6.  Disposition: Follow-up with Dr. Diona Browner or APP in 6 months or sooner if anything changes.  Medication Adjustments/Labs and Tests Ordered: Current medicines are reviewed at length with the patient today.  Concerns regarding medicines are outlined above.  No orders of the defined types were placed in this encounter.  No orders of the defined types were placed in this encounter.   Patient Instructions  Medication Instructions:  Your physician recommends that you continue on your current medications as directed. Please refer to the Current Medication list given to you today.  Labwork: None   Testing/Procedures: None   Follow-Up: Your physician recommends that you schedule a follow-up appointment in: 6 months   Any Other Special Instructions Will Be Listed Below (If  Applicable).  If you need a refill on your cardiac medications before your next appointment, please call your pharmacy.   Signed, Sharlene Dory, NP  12/03/2023 2:23 PM    [facility]

## 2023-12-04 ENCOUNTER — Telehealth: Payer: Self-pay | Admitting: Nurse Practitioner

## 2023-12-04 NOTE — Telephone Encounter (Signed)
-----   Message from Sharlene Dory sent at 12/03/2023  2:30 PM EST ----- Please call his pharmacy and verify to see if he is taking lisinopril.  I did not see this on his med list and I believe he was getting this confused with his carvedilol that he takes twice a day.  Usually lisinopril is once a day dosing. Thanks!   Best,  Sharlene Dory, NP

## 2023-12-04 NOTE — Telephone Encounter (Signed)
 Patient states he will check his medications in just a bit and get back with [place] via phone or mychart to let us know if he is taking this medication or not

## 2023-12-04 NOTE — Telephone Encounter (Signed)
 Per Laurence Slate it! No problem. He can take olmesartan.   Thanks for checking!   Best,  Sharlene Dory, NP

## 2023-12-04 NOTE — Telephone Encounter (Signed)
 Spoke with pharmacy tech. She states patient has been with pharmacy since 2014 patient is not currently taking lisinopril. His last fill of lisinopril 2.5 mg was in 2022

## 2023-12-04 NOTE — Telephone Encounter (Signed)
 Pharmacy is currently closed.

## 2023-12-06 DIAGNOSIS — J40 Bronchitis, not specified as acute or chronic: Secondary | ICD-10-CM | POA: Diagnosis not present

## 2023-12-06 DIAGNOSIS — Z0000 Encounter for general adult medical examination without abnormal findings: Secondary | ICD-10-CM | POA: Diagnosis not present

## 2024-01-11 ENCOUNTER — Telehealth: Payer: Self-pay | Admitting: Cardiology

## 2024-01-11 NOTE — Telephone Encounter (Signed)
 Pt states his right pinky and ring finger feel "weird" and numb. He also states his calves feel like they have been "ripped" inside after walking. He denies any CP, SOB, lightheadedness. Please advise.

## 2024-01-11 NOTE — Telephone Encounter (Signed)
Left detailed message regarding this.

## 2024-01-16 ENCOUNTER — Other Ambulatory Visit: Payer: Self-pay | Admitting: Cardiology

## 2024-01-17 ENCOUNTER — Other Ambulatory Visit: Payer: Self-pay | Admitting: Cardiology

## 2024-01-31 DIAGNOSIS — H905 Unspecified sensorineural hearing loss: Secondary | ICD-10-CM | POA: Diagnosis not present

## 2024-02-05 ENCOUNTER — Emergency Department (HOSPITAL_COMMUNITY)

## 2024-02-05 ENCOUNTER — Encounter (HOSPITAL_COMMUNITY): Payer: Self-pay | Admitting: Emergency Medicine

## 2024-02-05 ENCOUNTER — Observation Stay (HOSPITAL_COMMUNITY)
Admission: EM | Admit: 2024-02-05 | Discharge: 2024-02-07 | Disposition: A | Discharge status: Home / Self Care | Attending: Internal Medicine | Admitting: Internal Medicine

## 2024-02-05 ENCOUNTER — Other Ambulatory Visit: Payer: Self-pay

## 2024-02-05 DIAGNOSIS — Z79899 Other long term (current) drug therapy: Secondary | ICD-10-CM | POA: Diagnosis not present

## 2024-02-05 DIAGNOSIS — Z9861 Coronary angioplasty status: Secondary | ICD-10-CM | POA: Diagnosis not present

## 2024-02-05 DIAGNOSIS — F1910 Other psychoactive substance abuse, uncomplicated: Secondary | ICD-10-CM | POA: Diagnosis present

## 2024-02-05 DIAGNOSIS — I255 Ischemic cardiomyopathy: Secondary | ICD-10-CM | POA: Diagnosis present

## 2024-02-05 DIAGNOSIS — Z7902 Long term (current) use of antithrombotics/antiplatelets: Secondary | ICD-10-CM | POA: Insufficient documentation

## 2024-02-05 DIAGNOSIS — I200 Unstable angina: Secondary | ICD-10-CM | POA: Diagnosis not present

## 2024-02-05 DIAGNOSIS — Z7984 Long term (current) use of oral hypoglycemic drugs: Secondary | ICD-10-CM | POA: Insufficient documentation

## 2024-02-05 DIAGNOSIS — R079 Chest pain, unspecified: Secondary | ICD-10-CM | POA: Diagnosis not present

## 2024-02-05 DIAGNOSIS — R0602 Shortness of breath: Secondary | ICD-10-CM | POA: Diagnosis not present

## 2024-02-05 DIAGNOSIS — J45909 Unspecified asthma, uncomplicated: Secondary | ICD-10-CM | POA: Insufficient documentation

## 2024-02-05 DIAGNOSIS — I2510 Atherosclerotic heart disease of native coronary artery without angina pectoris: Secondary | ICD-10-CM | POA: Diagnosis not present

## 2024-02-05 DIAGNOSIS — E119 Type 2 diabetes mellitus without complications: Secondary | ICD-10-CM | POA: Insufficient documentation

## 2024-02-05 DIAGNOSIS — E1169 Type 2 diabetes mellitus with other specified complication: Secondary | ICD-10-CM

## 2024-02-05 DIAGNOSIS — I10 Essential (primary) hypertension: Secondary | ICD-10-CM | POA: Diagnosis not present

## 2024-02-05 DIAGNOSIS — R918 Other nonspecific abnormal finding of lung field: Secondary | ICD-10-CM | POA: Diagnosis not present

## 2024-02-05 DIAGNOSIS — I959 Hypotension, unspecified: Secondary | ICD-10-CM | POA: Diagnosis not present

## 2024-02-05 DIAGNOSIS — Z951 Presence of aortocoronary bypass graft: Secondary | ICD-10-CM | POA: Diagnosis not present

## 2024-02-05 DIAGNOSIS — F17210 Nicotine dependence, cigarettes, uncomplicated: Secondary | ICD-10-CM | POA: Insufficient documentation

## 2024-02-05 DIAGNOSIS — R0789 Other chest pain: Secondary | ICD-10-CM | POA: Diagnosis not present

## 2024-02-05 LAB — COMPREHENSIVE METABOLIC PANEL WITH GFR
ALT: 12 U/L (ref 0–44)
AST: 21 U/L (ref 15–41)
Albumin: 3.9 g/dL (ref 3.5–5.0)
Alkaline Phosphatase: 63 U/L (ref 38–126)
Anion gap: 9 (ref 5–15)
BUN: 14 mg/dL (ref 6–20)
CO2: 24 mmol/L (ref 22–32)
Calcium: 9 mg/dL (ref 8.9–10.3)
Chloride: 102 mmol/L (ref 98–111)
Creatinine, Ser: 1.47 mg/dL — ABNORMAL HIGH (ref 0.61–1.24)
GFR, Estimated: 55 mL/min — ABNORMAL LOW (ref >60)
Glucose, Bld: 155 mg/dL — ABNORMAL HIGH (ref 70–99)
Potassium: 3.9 mmol/L (ref 3.5–5.1)
Sodium: 135 mmol/L (ref 135–145)
Total Bilirubin: 0.8 mg/dL (ref 0.0–1.2)
Total Protein: 7 g/dL (ref 6.5–8.1)

## 2024-02-05 LAB — CBC WITH DIFFERENTIAL/PLATELET
Abs Immature Granulocytes: 0.01 10*3/uL (ref 0.00–0.07)
Basophils Absolute: 0 10*3/uL (ref 0.0–0.1)
Basophils Relative: 1 %
Eosinophils Absolute: 0 10*3/uL (ref 0.0–0.5)
Eosinophils Relative: 1 %
HCT: 37.2 % — ABNORMAL LOW (ref 39.0–52.0)
Hemoglobin: 12.9 g/dL — ABNORMAL LOW (ref 13.0–17.0)
Immature Granulocytes: 0 %
Lymphocytes Relative: 29 %
Lymphs Abs: 1.6 10*3/uL (ref 0.7–4.0)
MCH: 36.8 pg — ABNORMAL HIGH (ref 26.0–34.0)
MCHC: 34.7 g/dL (ref 30.0–36.0)
MCV: 106 fL — ABNORMAL HIGH (ref 80.0–100.0)
Monocytes Absolute: 0.3 10*3/uL (ref 0.1–1.0)
Monocytes Relative: 5 %
Neutro Abs: 3.5 10*3/uL (ref 1.7–7.7)
Neutrophils Relative %: 64 %
Platelets: 184 10*3/uL (ref 150–400)
RBC: 3.51 MIL/uL — ABNORMAL LOW (ref 4.22–5.81)
RDW: 15.3 % (ref 11.5–15.5)
WBC: 5.4 10*3/uL (ref 4.0–10.5)
nRBC: 0 % (ref 0.0–0.2)

## 2024-02-05 LAB — TROPONIN I (HIGH SENSITIVITY)
Troponin I (High Sensitivity): 9 ng/L (ref <18)
Troponin I (High Sensitivity): 9 ng/L (ref <18)

## 2024-02-05 LAB — BRAIN NATRIURETIC PEPTIDE: B Natriuretic Peptide: 176 pg/mL — ABNORMAL HIGH (ref 0.0–100.0)

## 2024-02-05 MED ORDER — CARVEDILOL 3.125 MG PO TABS
3.1250 mg | ORAL_TABLET | Freq: Two times a day (BID) | ORAL | Status: DC
  Administered 2024-02-05 – 2024-02-07 (×4): 3.125 mg via ORAL
  Filled 2024-02-05 (×4): qty 1

## 2024-02-05 MED ORDER — ASPIRIN 81 MG PO TBEC
81.0000 mg | DELAYED_RELEASE_TABLET | Freq: Every day | ORAL | Status: DC
  Administered 2024-02-07: 81 mg via ORAL
  Filled 2024-02-05: qty 1

## 2024-02-05 MED ORDER — ALLOPURINOL 300 MG PO TABS
300.0000 mg | ORAL_TABLET | Freq: Every day | ORAL | Status: DC
  Administered 2024-02-06 – 2024-02-07 (×2): 300 mg via ORAL
  Filled 2024-02-05 (×2): qty 1

## 2024-02-05 MED ORDER — NITROGLYCERIN 0.4 MG SL SUBL
0.4000 mg | SUBLINGUAL_TABLET | SUBLINGUAL | Status: DC | PRN
  Filled 2024-02-05: qty 1

## 2024-02-05 MED ORDER — MORPHINE SULFATE (PF) 2 MG/ML IV SOLN
2.0000 mg | INTRAVENOUS | Status: DC | PRN
  Administered 2024-02-05: 2 mg via INTRAVENOUS
  Filled 2024-02-05: qty 1

## 2024-02-05 MED ORDER — ONDANSETRON HCL 4 MG/2ML IJ SOLN
4.0000 mg | Freq: Once | INTRAMUSCULAR | Status: AC
  Administered 2024-02-05: 4 mg via INTRAVENOUS
  Filled 2024-02-05: qty 2

## 2024-02-05 MED ORDER — PANTOPRAZOLE SODIUM 40 MG PO TBEC
40.0000 mg | DELAYED_RELEASE_TABLET | Freq: Every day | ORAL | Status: DC
  Administered 2024-02-06 – 2024-02-07 (×2): 40 mg via ORAL
  Filled 2024-02-05 (×2): qty 1

## 2024-02-05 MED ORDER — MORPHINE SULFATE (PF) 4 MG/ML IV SOLN
4.0000 mg | Freq: Once | INTRAVENOUS | Status: AC
  Administered 2024-02-05: 4 mg via INTRAVENOUS
  Filled 2024-02-05: qty 1

## 2024-02-05 MED ORDER — RANOLAZINE ER 500 MG PO TB12
1000.0000 mg | ORAL_TABLET | Freq: Two times a day (BID) | ORAL | Status: DC
  Administered 2024-02-05 – 2024-02-06 (×3): 1000 mg via ORAL
  Filled 2024-02-05 (×3): qty 2

## 2024-02-05 MED ORDER — ACETAMINOPHEN 325 MG PO TABS
650.0000 mg | ORAL_TABLET | Freq: Four times a day (QID) | ORAL | Status: DC | PRN
  Administered 2024-02-06: 650 mg via ORAL
  Filled 2024-02-05: qty 2

## 2024-02-05 MED ORDER — ONDANSETRON HCL 4 MG/2ML IJ SOLN: 4.0000 mg | Freq: Four times a day (QID) | INTRAMUSCULAR | Status: DC | PRN

## 2024-02-05 MED ORDER — CLOPIDOGREL BISULFATE 75 MG PO TABS
75.0000 mg | ORAL_TABLET | Freq: Every day | ORAL | Status: DC
  Administered 2024-02-05 – 2024-02-07 (×3): 75 mg via ORAL
  Filled 2024-02-05 (×3): qty 1

## 2024-02-05 MED ORDER — ACETAMINOPHEN 650 MG RE SUPP: 650.0000 mg | Freq: Four times a day (QID) | RECTAL | Status: DC | PRN

## 2024-02-05 MED ORDER — DIAZEPAM 5 MG PO TABS
10.0000 mg | ORAL_TABLET | Freq: Two times a day (BID) | ORAL | Status: DC
  Administered 2024-02-05 – 2024-02-07 (×3): 10 mg via ORAL
  Filled 2024-02-05 (×3): qty 2

## 2024-02-05 MED ORDER — POLYETHYLENE GLYCOL 3350 17 G PO PACK: 17.0000 g | PACK | Freq: Every day | ORAL | Status: DC | PRN

## 2024-02-05 MED ORDER — SODIUM CHLORIDE 0.9 % WEIGHT BASED INFUSION
75.0000 mL/h | INTRAVENOUS | Status: AC
  Administered 2024-02-06 (×2): 75 mL/h via INTRAVENOUS

## 2024-02-05 MED ORDER — ASPIRIN 81 MG PO CHEW
263.0000 mg | CHEWABLE_TABLET | Freq: Once | ORAL | Status: AC
  Administered 2024-02-05: 263 mg via ORAL
  Filled 2024-02-05: qty 4

## 2024-02-05 MED ORDER — ONDANSETRON HCL 4 MG PO TABS
4.0000 mg | ORAL_TABLET | Freq: Four times a day (QID) | ORAL | Status: DC | PRN
Start: 2024-02-05 — End: 2024-02-07

## 2024-02-05 MED ORDER — ENOXAPARIN SODIUM 60 MG/0.6ML IJ SOSY
45.0000 mg | PREFILLED_SYRINGE | INTRAMUSCULAR | Status: DC
  Administered 2024-02-06: 45 mg via SUBCUTANEOUS
  Filled 2024-02-05: qty 0.6

## 2024-02-05 MED ORDER — PRAVASTATIN SODIUM 40 MG PO TABS: 40.0000 mg | ORAL_TABLET | Freq: Every day | ORAL | Status: DC

## 2024-02-05 MED ORDER — ISOSORBIDE MONONITRATE ER 60 MG PO TB24
60.0000 mg | ORAL_TABLET | Freq: Two times a day (BID) | ORAL | Status: DC
  Administered 2024-02-05: 60 mg via ORAL
  Filled 2024-02-05: qty 1

## 2024-02-05 NOTE — H&P (Signed)
 History and Physical    [name] [name]:[id number] DOB: [birth year] DOA: 02/05/2024  PCP: Veda Gerald, MD   Patient coming from: Home  I have personally briefly reviewed patient's old medical records in [facility]  Chief Complaint: Chest pain  HPI: [name] [name] is a 58 y.o. male with medical history significant for CABG- 2003, ischemic cardiomyopathy, diabetes mellitus, hypertension, bipolar disorder, polysubstance abuse. Presented to the ED with complaints of progressively worsening chest pain over the past month.  Describes it as chest tightness.  Reports severe chest pain waking him up from sleep, also present with exertion and improves with rest.  Chest pain is similar to prior chest pains when he had a heart attack.  No nausea vomiting but he reports diarrhea.  Reports some pain to his left elbow area.  He also reports some numbness to the medial 2 fingers of his right hand up to his wrist.  He reports compliance with aspirin  but he is not sure about his Plavix .  ED Course: Stable vitals.  EKG sinus rhythm with nonspecific ST-T wave changes.  Troponin 9 X 2.  Chest x-ray without acute abnormalities. Aspirin  given. Cardiology consulted, recommended admission to [facility] for cardiac cath.  Review of Systems: As per HPI all other systems reviewed and negative.  Past Medical History:  Diagnosis Date   Asthma    Bipolar disorder (HCC)    Chronic back pain    Coronary atherosclerosis of native coronary artery    a. CABG 2003. b. NSTEMI 2010 s/p BMS to SVG-RCA c. PTCA/stent/asp thromb to SVG-RCA 01/2010, PTCA to SVG-RCA 04/2010 d. known occlusion of SVG-RCA 2013. e. NSTEMI 07/2013  s/p PTCA/DES to SVG-diagonal/intermediate/OM1.    Essential hypertension    GERD (gastroesophageal reflux disease)    Gout    Ischemic cardiomyopathy    a. EF 35-40% by cath 07/2013.   Mixed hyperlipidemia    Sinus bradycardia    Type 2 diabetes mellitus [facility])     Past Surgical  History:  Procedure Laterality Date   CARDIAC CATHETERIZATION  06/19/2014   Procedure: CORONARY STENT INTERVENTION;  Surgeon: Arleen Lacer, MD;  Location: [facility] CATH LAB;  Service: Cardiovascular;;  DES to seg SVG Diag/OM/Ramus    CARDIAC CATHETERIZATION N/A 11/22/2015   Procedure: Left Heart Cath and Coronary Angiography;  Surgeon: Arty Binning, MD;  Location: [facility] INVASIVE CV LAB;  Service: Cardiovascular;  Laterality: N/A;   CARDIAC CATHETERIZATION  08/19/2020   CHOLECYSTECTOMY     CORONARY ARTERY BYPASS GRAFT     2003, LIMA to LAD; SVG to diagonal; SVG to OM, ramus, and diagonal; SVG to RCA   LEFT HEART CATH AND CORS/GRAFTS ANGIOGRAPHY N/A 08/19/2020   Procedure: LEFT HEART CATH AND CORS/GRAFTS ANGIOGRAPHY;  Surgeon: Sammy Crisp, MD;  Location: [facility] INVASIVE CV LAB;  Service: Cardiovascular;  Laterality: N/A;   LEFT HEART CATHETERIZATION WITH CORONARY ANGIOGRAM N/A 01/28/2015   Procedure: LEFT HEART CATHETERIZATION WITH CORONARY ANGIOGRAM;  Surgeon: Arleen Lacer, MD;  Location: [facility] CATH LAB;  Service: Cardiovascular;  Laterality: N/A;   LEFT HEART CATHETERIZATION WITH CORONARY/GRAFT ANGIOGRAM N/A 07/14/2013   Procedure: LEFT HEART CATHETERIZATION WITH Estella Helling;  Surgeon: Odie Benne, MD;  Location: [facility] CATH LAB;  Service: Cardiovascular;  Laterality: N/A;   LEFT HEART CATHETERIZATION WITH CORONARY/GRAFT ANGIOGRAM N/A 12/29/2013   Procedure: LEFT HEART CATHETERIZATION WITH Estella Helling;  Surgeon: Arleen Lacer, MD;  Location: [facility] CATH LAB;  Service: Cardiovascular;  Laterality: N/A;  LEFT HEART CATHETERIZATION WITH CORONARY/GRAFT ANGIOGRAM N/A 06/19/2014   Procedure: LEFT HEART CATHETERIZATION WITH Estella Helling;  Surgeon: Arleen Lacer, MD;  Location: [facility] CATH LAB;  Service: Cardiovascular;  Laterality: N/A;   Metal plate to chin     PERCUTANEOUS CORONARY STENT INTERVENTION (PCI-S)  07/14/2013   Procedure: PERCUTANEOUS CORONARY STENT  INTERVENTION (PCI-S);  Surgeon: Odie Benne, MD;  Location: [facility] CATH LAB;  Service: Cardiovascular;;   Pin placement left leg       reports that he has been smoking cigarettes. He started smoking about 45 years ago. He has a 36.5 pack-year smoking history. He has never used smokeless tobacco. He reports that he does not currently use alcohol . He reports current drug use. Frequency: 2.00 times per week. Drug: Marijuana.  No Known Allergies  Family History  Problem Relation Age of Onset   Stroke Other        family Hx    Bowel Disease Mother    Hernia Father    Pneumonia Daughter    Prior to Admission medications   Medication Sig Start Date End Date Taking? Authorizing Provider  acetaminophen  (TYLENOL ) 325 MG tablet Take 2 tablets (650 mg total) by mouth every 4 (four) hours as needed for headache or mild pain. 06/20/14   Kilroy, Luke K, PA-C  allopurinol  (ZYLOPRIM ) 300 MG tablet Take 300 mg by mouth daily. 11/03/15   [provider]  aspirin  EC 81 MG tablet Take 1 tablet (81 mg total) by mouth daily. 12/05/12   Lynell Sar, PA-C  Blood Pressure Monitoring KIT 1 each by Does not apply route as directed. 09/29/14   Gerard Knight, MD  carvedilol  (COREG ) 3.125 MG tablet TAKE ONE TABLET BY MOUTH TWICE DAILY WITH MEALS 01/17/24   Gerard Knight, MD  clopidogrel  (PLAVIX ) 75 MG tablet TAKE ONE TABLET BY MOUTH DAILY 07/25/23   Gerard Knight, MD  diazepam  (VALIUM ) 10 MG tablet Take 10 mg by mouth 2 (two) times daily.    [provider]  FLOVENT  HFA 220 MCG/ACT inhaler Inhale 1 puff into the lungs daily.  10/10/17   [provider]  furosemide  (LASIX ) 40 MG tablet TAKE ONE TABLET BY MOUTH DAILY 05/09/23   Gerard Knight, MD  isosorbide  mononitrate (IMDUR ) 30 MG 24 hr tablet Take 2 tablets (60 mg total) by mouth 2 (two) times daily. 04/30/23   Gerard Knight, MD  magnesium  oxide (MAG-OX) 400 (240 Mg) MG tablet Take 400 mg by mouth daily.     [provider]  Magnesium  Oxide -Mg Supplement 200 MG TABS Take 1 tablet by mouth daily. 12/14/23   [provider]  metFORMIN  (GLUCOPHAGE ) 1000 MG tablet Take 1,000 mg by mouth daily with breakfast.    [provider]  nitroGLYCERIN  (NITROSTAT ) 0.4 MG SL tablet dissolve ONE tablet UNDER THE TONGUE every FIVE minutes FOR UP TO THREE doses if needed FOR CHEST pain. IF NO RELIEF AFTER 3RD DOSE, PROCEED TO ER OR CALL 911 01/17/24   Gerard Knight, MD  olmesartan (BENICAR) 5 MG tablet Take 5 mg by mouth daily. Patient not taking: Reported on 12/03/2023 11/16/23   [provider]  omega-3 acid ethyl esters (LOVAZA ) 1 g capsule Take 1 capsule by mouth every morning. & 2 capsules at night 08/18/19   [provider]  ONETOUCH VERIO test strip  10/10/17   [provider]  pantoprazole  (PROTONIX ) 40 MG tablet Take 40 mg by mouth daily.  [provider]  potassium chloride  (KLOR-CON  M) 10 MEQ tablet Take 10 mEq by mouth daily. 01/16/22   [provider]  potassium chloride  (KLOR-CON ) 10 MEQ tablet Take 10 mEq by mouth daily. 02/04/24   [provider]  pravastatin  (PRAVACHOL ) 40 MG tablet TAKE ONE TABLET BY MOUTH EVERY DAY 07/02/23   Gerard Knight, MD  ranolazine  (RANEXA ) 1000 MG SR tablet TAKE ONE TABLET BY MOUTH 2 TIMES A DAY 01/16/24   Gerard Knight, MD    Physical Exam: Vitals:   02/05/24 1419 02/05/24 1421  BP: 119/80   Pulse: 72   Resp: 18   Temp: 98.1 F (36.7 C)   TempSrc: Oral   SpO2: 99%   Weight:  93.9 kg  Height:  5\' 4"  (1.626 m)    Constitutional: NAD, calm, comfortable Vitals:   02/05/24 1419 02/05/24 1421  BP: 119/80   Pulse: 72   Resp: 18   Temp: 98.1 F (36.7 C)   TempSrc: Oral   SpO2: 99%   Weight:  93.9 kg  Height:  5\' 4"  (1.626 m)   Eyes: PERRL, lids and conjunctivae normal ENMT: Mucous membranes are moist.   Neck: normal, supple, no masses, no thyromegaly Respiratory: clear to  auscultation bilaterally, no wheezing, no crackles. Normal respiratory effort. No accessory muscle use.  Cardiovascular: Regular rate and rhythm, no murmurs / rubs / gallops. No extremity edema.  Abdomen: no tenderness, no masses palpated. No hepatosplenomegaly. Bowel sounds positive.  Musculoskeletal: no clubbing / cyanosis. No joint deformity upper and lower extremities.  Skin: no rashes, lesions, ulcers. No induration Neurologic: No facial asymmetry, moving extremity spontaneously, speech fluent. Psychiatric: Normal judgment and insight. Alert and oriented x 3. Normal mood.   Labs on Admission: I have personally reviewed following labs and imaging studies  CBC: Recent Labs  Lab 02/05/24 1445  WBC 5.4  NEUTROABS 3.5  HGB 12.9*  HCT 37.2*  MCV 106.0*  PLT 184   Basic Metabolic Panel: Recent Labs  Lab 02/05/24 1445  NA 135  K 3.9  CL 102  CO2 24  GLUCOSE 155*  BUN 14  CREATININE 1.47*  CALCIUM 9.0   GFR: Estimated Creatinine Clearance: 57.3 mL/min (A) (by C-G formula based on SCr of 1.47 mg/dL (H)). Liver Function Tests: Recent Labs  Lab 02/05/24 1445  AST 21  ALT 12  ALKPHOS 63  BILITOT 0.8  PROT 7.0  ALBUMIN 3.9   Radiological Exams on Admission: DG Chest Port 1 View Result Date: 02/05/2024 CLINICAL DATA:  Chest pain and shortness of breath EXAM: PORTABLE CHEST 1 VIEW COMPARISON:  07/19/2023 FINDINGS: Postoperative changes in the mediastinum. Heart size and pulmonary vascularity are normal. Emphysematous changes in the lungs. No airspace disease or consolidation. No pleural effusion or pneumothorax. Mediastinal contours appear intact. IMPRESSION: Emphysematous changes in the lungs.  No focal consolidation. Electronically Signed   By: Boyce Byes M.D.   On: 02/05/2024 15:22   EKG: Independently reviewed. Sinus rhythm, rate 67, QTc 399.  No significant change from prior.  Assessment/Plan Principal Problem:   Chest pain Active Problems:   CAD S/P  percutaneous coronary angioplasty - PCI of SVG-OM   Polysubstance abuse (HCC)   Essential hypertension, benign   Cardiomyopathy, ischemic   DM type 2 (diabetes mellitus, type 2) (HCC)  Assessment and Plan: * Chest pain History of CABG 2003.  Chest pain concerning for ACS.  Reports compliance with aspirin  , ?? Unsure of Plavix .  Follows with Dr. Londa Rival.  Last cardiac cath 08/19/2020- widely patent LIMA to LAD patent SVG to D1 with mild to moderate in-stent restenosis of the proximal and distal's graft stents he also had a chronically occluded SVG to RCA at that time aggressive medical management was recommended.  - Troponin 9 X 2, EKG with nonspecific ST-T wave changes to II, III, aVF -Evaluated by Dr. Renna Cary, recommend admission to [facility] for cardiac cath -Following cardiology recommendations, continue carvedilol , aspirin , Plavix , Imdur , pravastatin , ranolaxine, hydrate overnight - N.p.o. midnight  DM type 2 (diabetes mellitus, type 2) (HCC) - SSI- S - HgbA1c - Hold Metformin  while inpatient  Cardiomyopathy, ischemic Stable and compensated.  Last echo 2022 EF 55%.  Normal LV diastolic parameters. -Hold Lasix  20 mg daily to allow for some hydration and for contrast exposure tomorrow  Essential hypertension, benign Stable. -Resume carvedilol , Imdur  - Hold olmesartan for contrast exposure   DVT prophylaxis: Lovenox  Code Status: FULL Family Communication: None at bedside Disposition Plan: ~ 2 days Consults called:  Cardiology Admission status:  Obs Tele    Author: Pati Bonine, MD 02/05/2024 7:05 PM  For on call review [email or web address].

## 2024-02-05 NOTE — Progress Notes (Signed)
   02/05/24 1846  TOC Brief Assessment  Insurance and Status Reviewed  Patient has primary care physician Yes  Home environment has been reviewed From home  Prior level of function: Independent  Prior/Current Home Services No current home services  Social Drivers of Health Review SDOH reviewed interventions complete (smoking cessation added to AVS)  Readmission risk has been reviewed Yes  Transition of care needs no transition of care needs at this time   Pt to transfer to [facility] for heart cath. Transition of Care Department [facility]) has reviewed patient and will continue to monitor.

## 2024-02-05 NOTE — Consult Note (Signed)
 Cardiology Consultation   Patient ID: [name] [name] MRN: [id number]; DOB: [birth year]  Admit date: 02/05/2024 Date of Consult: 02/05/2024  PCP:  Veda Gerald, MD   [facility] Providers Cardiologist:  Teddie Favre, MD          History of Present Illness:   [name] [name] is a 58 year old male known well to our service, Dr. Londa Rival with coronary disease status post CABG in 2003 with mildly reduced ejection fraction secondary to ischemic cardiomyopathy hyperlipidemia type 2 diabetes bipolar disorder tobacco use with worsening unstable anginal type symptoms over the past month.  Waking up feeling as though he is going to die from a heart attack.  Central line pressure worse with exertion better with rest.  Last seen by Dr. Londa Rival in July 2024.  Last heart catheterization in 08/19/2020 showed widely patent LIMA to LAD patent SVG to D1 with mild to moderate in-stent restenosis of the proximal and distal's graft stents he also had a chronically occluded SVG to RCA at that time aggressive medical management was recommended.  Last echo in 2021 also showed ejection fraction of 60%.  Estimated pulmonary pressures were 31 mmHg.   Past Medical History:  Diagnosis Date   Asthma    Bipolar disorder (HCC)    Chronic back pain    Coronary atherosclerosis of native coronary artery    a. CABG 2003. b. NSTEMI 2010 s/p BMS to SVG-RCA c. PTCA/stent/asp thromb to SVG-RCA 01/2010, PTCA to SVG-RCA 04/2010 d. known occlusion of SVG-RCA 2013. e. NSTEMI 07/2013  s/p PTCA/DES to SVG-diagonal/intermediate/OM1.    Essential hypertension    GERD (gastroesophageal reflux disease)    Gout    Ischemic cardiomyopathy    a. EF 35-40% by cath 07/2013.   Mixed hyperlipidemia    Sinus bradycardia    Type 2 diabetes mellitus [facility])     Past Surgical History:  Procedure Laterality Date   CARDIAC CATHETERIZATION  06/19/2014   Procedure: CORONARY STENT INTERVENTION;  Surgeon: Arleen Lacer, MD;   Location: [facility] CATH LAB;  Service: Cardiovascular;;  DES to seg SVG Diag/OM/Ramus    CARDIAC CATHETERIZATION N/A 11/22/2015   Procedure: Left Heart Cath and Coronary Angiography;  Surgeon: Arty Binning, MD;  Location: [facility] INVASIVE CV LAB;  Service: Cardiovascular;  Laterality: N/A;   CARDIAC CATHETERIZATION  08/19/2020   CHOLECYSTECTOMY     CORONARY ARTERY BYPASS GRAFT     2003, LIMA to LAD; SVG to diagonal; SVG to OM, ramus, and diagonal; SVG to RCA   LEFT HEART CATH AND CORS/GRAFTS ANGIOGRAPHY N/A 08/19/2020   Procedure: LEFT HEART CATH AND CORS/GRAFTS ANGIOGRAPHY;  Surgeon: Sammy Crisp, MD;  Location: [facility] INVASIVE CV LAB;  Service: Cardiovascular;  Laterality: N/A;   LEFT HEART CATHETERIZATION WITH CORONARY ANGIOGRAM N/A 01/28/2015   Procedure: LEFT HEART CATHETERIZATION WITH CORONARY ANGIOGRAM;  Surgeon: Arleen Lacer, MD;  Location: [facility] CATH LAB;  Service: Cardiovascular;  Laterality: N/A;   LEFT HEART CATHETERIZATION WITH CORONARY/GRAFT ANGIOGRAM N/A 07/14/2013   Procedure: LEFT HEART CATHETERIZATION WITH Estella Helling;  Surgeon: Odie Benne, MD;  Location: [facility] CATH LAB;  Service: Cardiovascular;  Laterality: N/A;   LEFT HEART CATHETERIZATION WITH CORONARY/GRAFT ANGIOGRAM N/A 12/29/2013   Procedure: LEFT HEART CATHETERIZATION WITH Estella Helling;  Surgeon: Arleen Lacer, MD;  Location: [facility] CATH LAB;  Service: Cardiovascular;  Laterality: N/A;   LEFT HEART CATHETERIZATION WITH CORONARY/GRAFT ANGIOGRAM N/A 06/19/2014   Procedure: LEFT HEART CATHETERIZATION WITH Estella Helling;  Surgeon: Arleen Lacer,  MD;  Location: [facility] CATH LAB;  Service: Cardiovascular;  Laterality: N/A;   Metal plate to chin     PERCUTANEOUS CORONARY STENT INTERVENTION (PCI-S)  07/14/2013   Procedure: PERCUTANEOUS CORONARY STENT INTERVENTION (PCI-S);  Surgeon: Odie Benne, MD;  Location: [facility] CATH LAB;  Service: Cardiovascular;;   Pin placement left leg       Home  Medications:  Prior to Admission medications   Medication Sig Start Date End Date Taking? Authorizing Provider  acetaminophen  (TYLENOL ) 325 MG tablet Take 2 tablets (650 mg total) by mouth every 4 (four) hours as needed for headache or mild pain. 06/20/14   Kilroy, Luke K, PA-C  allopurinol  (ZYLOPRIM ) 300 MG tablet Take 300 mg by mouth daily. 11/03/15   [provider]  aspirin  EC 81 MG tablet Take 1 tablet (81 mg total) by mouth daily. 12/05/12   Lynell Sar, PA-C  Blood Pressure Monitoring KIT 1 each by Does not apply route as directed. 09/29/14   Gerard Knight, MD  carvedilol  (COREG ) 3.125 MG tablet TAKE ONE TABLET BY MOUTH TWICE DAILY WITH MEALS 01/17/24   Gerard Knight, MD  clopidogrel  (PLAVIX ) 75 MG tablet TAKE ONE TABLET BY MOUTH DAILY 07/25/23   Gerard Knight, MD  diazepam  (VALIUM ) 10 MG tablet Take 10 mg by mouth 2 (two) times daily.    [provider]  FLOVENT  HFA 220 MCG/ACT inhaler Inhale 1 puff into the lungs daily.  10/10/17   [provider]  furosemide  (LASIX ) 40 MG tablet TAKE ONE TABLET BY MOUTH DAILY 05/09/23   Gerard Knight, MD  isosorbide  mononitrate (IMDUR ) 30 MG 24 hr tablet Take 2 tablets (60 mg total) by mouth 2 (two) times daily. 04/30/23   Gerard Knight, MD  magnesium  oxide (MAG-OX) 400 (240 Mg) MG tablet Take 400 mg by mouth daily.    [provider]  Magnesium  Oxide -Mg Supplement 200 MG TABS Take 1 tablet by mouth daily. 12/14/23   [provider]  metFORMIN  (GLUCOPHAGE ) 1000 MG tablet Take 1,000 mg by mouth daily with breakfast.    [provider]  nitroGLYCERIN  (NITROSTAT ) 0.4 MG SL tablet dissolve ONE tablet UNDER THE TONGUE every FIVE minutes FOR UP TO THREE doses if needed FOR CHEST pain. IF NO RELIEF AFTER 3RD DOSE, PROCEED TO ER OR CALL 911 01/17/24   Gerard Knight, MD  olmesartan (BENICAR) 5 MG tablet Take 5 mg by mouth daily. Patient not taking: Reported on 12/03/2023 11/16/23    [provider]  omega-3 acid ethyl esters (LOVAZA ) 1 g capsule Take 1 capsule by mouth every morning. & 2 capsules at night 08/18/19   [provider]  ONETOUCH VERIO test strip  10/10/17   [provider]  pantoprazole  (PROTONIX ) 40 MG tablet Take 40 mg by mouth daily.     [provider]  potassium chloride  (KLOR-CON  M) 10 MEQ tablet Take 10 mEq by mouth daily. 01/16/22   [provider]  potassium chloride  (KLOR-CON ) 10 MEQ tablet Take 10 mEq by mouth daily. 02/04/24   [provider]  pravastatin  (PRAVACHOL ) 40 MG tablet TAKE ONE TABLET BY MOUTH EVERY DAY 07/02/23   Gerard Knight, MD  ranolazine  (RANEXA ) 1000 MG SR tablet TAKE ONE TABLET BY MOUTH 2 TIMES A DAY 01/16/24   Gerard Knight, MD    Inpatient Medications: Scheduled Meds:  Continuous Infusions:  PRN Meds:   Allergies:   No Known Allergies  Social History:  Social History   Socioeconomic History   Marital status: Married    Spouse name: Not on file   Number of children: Not on file   Years of education: Not on file   Highest education level: Not on file  Occupational History   Not on file  Tobacco Use   Smoking status: Every Day    Current packs/day: 0.80    Average packs/day: 0.8 packs/day for 45.6 years (36.5 ttl pk-yrs)    Types: Cigarettes    Start date: 07/01/1978   Smokeless tobacco: Never   Tobacco comments:    trying to quit, 3/4 pack per day. Started the nicotine  patch on 01-10-14-   Vaping Use   Vaping status: Never Used  Substance and Sexual Activity   Alcohol  use: Not Currently    Comment: drink a can of beer occasionally every year    Drug use: Yes    Frequency: 2.0 times per week    Types: Marijuana   Sexual activity: Yes    Partners: Female  Other Topics Concern   Not on file  Social History Narrative   Married.    Social Drivers of Corporate investment banker Strain: Not on file  Food Insecurity: Not on file  Transportation  Needs: Not on file  Physical Activity: Not on file  Stress: Not on file  Social Connections: Not on file  Intimate Partner Violence: Not on file    Family History:    Family History  Problem Relation Age of Onset   Stroke Other        family Hx    Bowel Disease Mother    Hernia Father    Pneumonia Daughter      ROS:  Please see the history of present illness.   All other ROS reviewed and negative.     Physical Exam/Data:   Vitals:   02/05/24 1419 02/05/24 1421  BP: 119/80   Pulse: 72   Resp: 18   Temp: 98.1 F (36.7 C)   TempSrc: Oral   SpO2: 99%   Weight:  93.9 kg  Height:  5\' 4"  (1.626 m)   No intake or output data in the 24 hours ending 02/05/24 1637    02/05/2024    2:21 PM 12/03/2023   10:23 AM 05/16/2023   12:59 AM  Last 3 Weights  Weight (lbs) 207 lb 198 lb 211 lb  Weight (kg) 93.895 kg 89.812 kg 95.709 kg     Body mass index is 35.53 kg/m.  General:  Well nourished, well developed, in no acute distress HEENT: normal Neck: no JVD Vascular: No carotid bruits; Distal pulses 2+ bilaterally Cardiac:  normal S1, S2; RRR; no murmur CABG scar Lungs:  clear to auscultation bilaterally, no wheezing, rhonchi or rales  Abd: soft, nontender, no hepatomegaly  Ext: no edema Musculoskeletal:  No deformities, BUE and BLE strength normal and equal Skin: warm and dry  Neuro:  CNs 2-12 intact, no focal abnormalities noted Psych:  Normal affect   EKG:  The EKG was personally reviewed and demonstrates: Sinus rhythm with nonspecific ST-T wave changesOld inferior infarct pattern. Telemetry:  Telemetry was personally reviewed and demonstrates: Currently no adverse arrhythmias  Relevant CV Studies: Cardiac catheterization 2020/02/23 Diagnostic Dominance: Right   Laboratory Data:  High Sensitivity Troponin:   Recent Labs  Lab 02/05/24 1445  TROPONINIHS 9     Chemistry Recent Labs  Lab 02/05/24 1445  NA 135  K 3.9  CL 102  CO2 24  GLUCOSE 155*  BUN 14   CREATININE 1.47*  CALCIUM 9.0  GFRNONAA 55*  ANIONGAP 9    Recent Labs  Lab 02/05/24 1445  PROT 7.0  ALBUMIN 3.9  AST 21  ALT 12  ALKPHOS 63  BILITOT 0.8   Lipids No results for input(s): "CHOL", "TRIG", "HDL", "LABVLDL", "LDLCALC", "CHOLHDL" in the last 168 hours.  Hematology Recent Labs  Lab 02/05/24 1445  WBC 5.4  RBC 3.51*  HGB 12.9*  HCT 37.2*  MCV 106.0*  MCH 36.8*  MCHC 34.7  RDW 15.3  PLT 184   Thyroid No results for input(s): "TSH", "FREET4" in the last 168 hours.  BNP Recent Labs  Lab 02/05/24 1445  BNP 176.0*    DDimer No results for input(s): "DDIMER" in the last 168 hours.   Radiology/Studies:  DG Chest Port 1 View Result Date: 02/05/2024 CLINICAL DATA:  Chest pain and shortness of breath EXAM: PORTABLE CHEST 1 VIEW COMPARISON:  07/19/2023 FINDINGS: Postoperative changes in the mediastinum. Heart size and pulmonary vascularity are normal. Emphysematous changes in the lungs. No airspace disease or consolidation. No pleural effusion or pneumothorax. Mediastinal contours appear intact. IMPRESSION: Emphysematous changes in the lungs.  No focal consolidation. Electronically Signed   By: Boyce Byes M.D.   On: 02/05/2024 15:22     Assessment and Plan:   57 year old with bypass surgery, known occluded RCA graft, patent LIMA to LAD, prior ramus stenting here with several days of worsening chest discomfort shortness of breath.  Sometimes he will wake up in the middle night thinking that he is going to die from a heart attack.  He has central chest discomfort that is pressure-like worse with exertion.  No fevers chills nausea vomiting syncope bleeding.  Unstable angina - Troponin thus far is negative.  EKG fairly unchanged from prior with nonspecific ST-T wave changes.  Nonetheless given the escalation of his symptoms and last cardiac catheterization being performed in 2021, we will go ahead and transport him over to [facility] for further  evaluation and cardiac catheterization tomorrow.  Risk and benefits including stroke heart attack death renal impairment bleeding of been discussed.  He states that they have tried radial artery approach in the past but had to transfer to groin approach.  He understands that this may result in once again medical management but given the escalation of symptoms I think this makes sense. - Okay to continue with carvedilol  3.125 mg twice a day Plavix  75 mg once a day isosorbide  60 mg a day Lovaza  ranolazine  1000 mg twice a day pravastatin  40 mg a day aspirin  81 mg a day -Would hydrate overnight. -Okay to hold olmesartan in anticipation of contrast load.   Diabetes hypertension hyperlipidemia - Continue with aggressive secondary risk factor prevention.  Per primary team.  Thankfully at this point, troponin is normal.  However, cardiac catheterization is deserved given escalation of symptoms.   Risk Assessment/Risk Scores:     TIMI Risk Score for Unstable Angina or Non-ST Elevation MI:   The patient's TIMI risk score is 4, which indicates a 20% risk of all cause mortality, new or recurrent myocardial infarction or need for urgent revascularization in the next 14 days.      For questions or updates, please contact [facility] Please consult [email or web address] for contact info under    Signed, Dorothye Gathers, MD  02/05/2024 4:37 PM

## 2024-02-05 NOTE — Progress Notes (Signed)
 Cath orders entered per Dr. Renna Cary request - he requested 75mL/hr IVF starting 8pm this evening. Please see his note for additional recommendations. I released the NPO after MN order as well as IVF order.

## 2024-02-05 NOTE — Assessment & Plan Note (Signed)
 Stable and compensated.  Last echo 2022 EF 55%.  Normal LV diastolic parameters. -Hold Lasix  20 mg daily to allow for some hydration and for contrast exposure tomorrow

## 2024-02-05 NOTE — Care Management Obs Status (Signed)
 MEDICARE OBSERVATION STATUS NOTIFICATION   Patient Details  Name: [name] [name] MRN: [id number] Date of Birth: [birth year]   Medicare Observation Status Notification Given:  Yes    Geraldina Klinefelter, RN 02/05/2024, 6:43 PM

## 2024-02-05 NOTE — ED Notes (Signed)
 [facility] called for transport. [name] [name]

## 2024-02-05 NOTE — Assessment & Plan Note (Addendum)
 History of CABG 2003.  Chest pain concerning for ACS.  Reports compliance with aspirin  , ?? Unsure of Plavix .  Follows with Dr. Londa Rival. Last cardiac cath 08/19/2020- widely patent LIMA to LAD patent SVG to D1 with mild to moderate in-stent restenosis of the proximal and distal's graft stents he also had a chronically occluded SVG to RCA at that time aggressive medical management was recommended.  - Troponin 9 X 2, EKG with nonspecific ST-T wave changes to II, III, aVF -Evaluated by Dr. Renna Cary, recommend admission to [facility] for cardiac cath -Following cardiology recommendations, continue carvedilol , aspirin , Plavix , Imdur , pravastatin , ranolaxine, hydrate overnight - N.p.o. midnight

## 2024-02-05 NOTE — ED Provider Notes (Signed)
 [place] EMERGENCY DEPARTMENT AT [facility] Provider Note   CSN: [id number] Arrival date & time: 02/05/24  1410     History  Chief Complaint  Patient presents with   Chest Pain    BOE GOFFREDO is a 58 y.o. male.  Patient is a 58 year old male with a long past medical history of coronary artery disease who presents emergency department with a chief complaint of chest pain and exertional shortness of breath which has been ongoing for approximate the past month.  He does note that his pain and shortness of breath have been greatly worsened with exertion.  He notes that he is followed by Dr. Londa Rival with cardiology.  He denies any lower extremity pain or edema, hemoptysis.  He denies any abdominal pain, nausea, vomiting, diarrhea.  He has had no associated dizziness, lightheadedness or syncope.   Chest Pain Associated symptoms: shortness of breath        Home Medications Prior to Admission medications   Medication Sig Start Date End Date Taking? Authorizing Provider  acetaminophen  (TYLENOL ) 325 MG tablet Take 2 tablets (650 mg total) by mouth every 4 (four) hours as needed for headache or mild pain. 06/20/14   Kilroy, Luke K, PA-C  allopurinol  (ZYLOPRIM ) 300 MG tablet Take 300 mg by mouth daily. 11/03/15   [provider]  aspirin  EC 81 MG tablet Take 1 tablet (81 mg total) by mouth daily. 12/05/12   Lynell Sar, PA-C  Blood Pressure Monitoring KIT 1 each by Does not apply route as directed. 09/29/14   Gerard Knight, MD  carvedilol  (COREG ) 3.125 MG tablet TAKE ONE TABLET BY MOUTH TWICE DAILY WITH MEALS 01/17/24   Gerard Knight, MD  clopidogrel  (PLAVIX ) 75 MG tablet TAKE ONE TABLET BY MOUTH DAILY 07/25/23   Gerard Knight, MD  diazepam  (VALIUM ) 10 MG tablet Take 10 mg by mouth 2 (two) times daily.    [provider]  FLOVENT  HFA 220 MCG/ACT inhaler Inhale 1 puff into the lungs daily.  10/10/17   [provider]  furosemide   (LASIX ) 40 MG tablet TAKE ONE TABLET BY MOUTH DAILY 05/09/23   Gerard Knight, MD  isosorbide  mononitrate (IMDUR ) 30 MG 24 hr tablet Take 2 tablets (60 mg total) by mouth 2 (two) times daily. 04/30/23   Gerard Knight, MD  magnesium  oxide (MAG-OX) 400 (240 Mg) MG tablet Take 400 mg by mouth daily.    [provider]  Magnesium  Oxide -Mg Supplement 200 MG TABS Take 1 tablet by mouth daily. 12/14/23   [provider]  metFORMIN  (GLUCOPHAGE ) 1000 MG tablet Take 1,000 mg by mouth daily with breakfast.    [provider]  nitroGLYCERIN  (NITROSTAT ) 0.4 MG SL tablet dissolve ONE tablet UNDER THE TONGUE every FIVE minutes FOR UP TO THREE doses if needed FOR CHEST pain. IF NO RELIEF AFTER 3RD DOSE, PROCEED TO ER OR CALL 911 01/17/24   Gerard Knight, MD  olmesartan (BENICAR) 5 MG tablet Take 5 mg by mouth daily. Patient not taking: Reported on 12/03/2023 11/16/23   [provider]  omega-3 acid ethyl esters (LOVAZA ) 1 g capsule Take 1 capsule by mouth every morning. & 2 capsules at night 08/18/19   [provider]  ONETOUCH VERIO test strip  10/10/17   [provider]  pantoprazole  (PROTONIX ) 40 MG tablet Take 40 mg by mouth daily.     [provider]  potassium chloride  (KLOR-CON  M) 10 MEQ tablet Take  10 mEq by mouth daily. 01/16/22   [provider]  potassium chloride  (KLOR-CON ) 10 MEQ tablet Take 10 mEq by mouth daily. 02/04/24   [provider]  pravastatin  (PRAVACHOL ) 40 MG tablet TAKE ONE TABLET BY MOUTH EVERY DAY 07/02/23   Gerard Knight, MD  ranolazine  (RANEXA ) 1000 MG SR tablet TAKE ONE TABLET BY MOUTH 2 TIMES A DAY 01/16/24   Gerard Knight, MD      Allergies    Patient has no known allergies.    Review of Systems   Review of Systems  Respiratory:  Positive for shortness of breath.   Cardiovascular:  Positive for chest pain.    Physical Exam Updated Vital Signs BP 119/80 (BP Location: Right Arm)    Pulse 72   Temp 98.1 F (36.7 C) (Oral)   Resp 18   Ht 5\' 4"  (1.626 m)   Wt 93.9 kg   SpO2 99%   BMI 35.53 kg/m  Physical Exam Vitals and nursing note reviewed.  Constitutional:      Appearance: Normal appearance.  HENT:     Head: Normocephalic and atraumatic.     Nose: Nose normal.     Mouth/Throat:     Mouth: Mucous membranes are moist.  Eyes:     Extraocular Movements: Extraocular movements intact.     Conjunctiva/sclera: Conjunctivae normal.     Pupils: Pupils are equal, round, and reactive to light.  Cardiovascular:     Rate and Rhythm: Normal rate and regular rhythm.     Pulses: Normal pulses.     Heart sounds: Normal heart sounds. Heart sounds not distant.  Pulmonary:     Effort: Pulmonary effort is normal. No tachypnea or respiratory distress.     Breath sounds: Normal breath sounds. No stridor. No decreased breath sounds, wheezing, rhonchi or rales.  Chest:     Chest wall: No tenderness.  Abdominal:     General: Abdomen is flat. Bowel sounds are normal.     Palpations: Abdomen is soft. There is no mass.     Tenderness: There is no abdominal tenderness. There is no guarding.  Musculoskeletal:        General: Normal range of motion.     Cervical back: Normal range of motion and neck supple.     Right lower leg: No tenderness. No edema.     Left lower leg: No tenderness. No edema.  Skin:    General: Skin is warm and dry.  Neurological:     General: No focal deficit present.     Mental Status: He is alert and oriented to person, place, and time. Mental status is at baseline.  Psychiatric:        Mood and Affect: Mood normal.        Behavior: Behavior normal.        Thought Content: Thought content normal.        Judgment: Judgment normal.     ED Results / Procedures / Treatments   Labs (all labs ordered are listed, but only abnormal results are displayed) Labs Reviewed  BRAIN NATRIURETIC PEPTIDE - Abnormal; Notable for the following components:       Result Value   B Natriuretic Peptide 176.0 (*)    All other components within normal limits  COMPREHENSIVE METABOLIC PANEL WITH GFR - Abnormal; Notable for the following components:   Glucose, Bld 155 (*)    Creatinine, Ser 1.47 (*)    GFR, Estimated 55 (*)  All other components within normal limits  CBC WITH DIFFERENTIAL/PLATELET - Abnormal; Notable for the following components:   RBC 3.51 (*)    Hemoglobin 12.9 (*)    HCT 37.2 (*)    MCV 106.0 (*)    MCH 36.8 (*)    All other components within normal limits  TROPONIN I (HIGH SENSITIVITY)  TROPONIN I (HIGH SENSITIVITY)    EKG EKG Interpretation Date/Time:  Tuesday February 05 2024 14:22:43 EDT Ventricular Rate:  67 PR Interval:  164 QRS Duration:  78 QT Interval:  378 QTC Calculation: 399 R Axis:   57  Text Interpretation: Normal sinus rhythm with sinus arrhythmia Possible Inferior infarct , age undetermined Abnormal ECG When compared with ECG of 16-May-2023 00:59, No significant change since last tracing Confirmed by Racheal Buddle [phone]) on 02/05/2024 2:36:04 PM  Radiology DG Chest Port 1 View Result Date: 02/05/2024 CLINICAL DATA:  Chest pain and shortness of breath EXAM: PORTABLE CHEST 1 VIEW COMPARISON:  07/19/2023 FINDINGS: Postoperative changes in the mediastinum. Heart size and pulmonary vascularity are normal. Emphysematous changes in the lungs. No airspace disease or consolidation. No pleural effusion or pneumothorax. Mediastinal contours appear intact. IMPRESSION: Emphysematous changes in the lungs.  No focal consolidation. Electronically Signed   By: Boyce Byes M.D.   On: 02/05/2024 15:22    Procedures Procedures    Medications Ordered in ED Medications  morphine  (PF) 4 MG/ML injection 4 mg (4 mg Intravenous Given 02/05/24 1541)  ondansetron  (ZOFRAN ) injection 4 mg (4 mg Intravenous Given 02/05/24 1541)  aspirin  chewable tablet 263 mg (263 mg Oral Given 02/05/24 1602)    ED Course/ Medical Decision  Making/ A&P                                 Medical Decision Making Amount and/or Complexity of Data Reviewed Labs: ordered. Radiology: ordered.  Risk OTC drugs. Prescription drug management. Decision regarding hospitalization.   This patient presents to the ED for concern of chest pain, dyspnea, this involves an extensive number of treatment options, and is a complaint that carries with it a high risk of complications and morbidity.  The differential diagnosis includes ACS, pulmonary embolus, acute CHF, pericarditis, arthritis, endocarditis, pneumothorax, hemothorax, pneumonia, sepsis   Co morbidities that complicate the patient evaluation  CAD   Additional history obtained:  Additional history obtained from medical records External records from outside source obtained and reviewed including medical records   Lab Tests:  I Ordered, and personally interpreted labs.  The pertinent results include: No leukocytosis, anemia, elevated creatinine, normal electrolytes, normal liver function, elevated BNP, normal troponin   Imaging Studies ordered:  I ordered imaging studies including chest x-ray I independently visualized and interpreted imaging which showed no acute cardiopulmonary process I agree with the radiologist interpretation   Cardiac Monitoring: / EKG:  The patient was maintained on a cardiac monitor.  I personally viewed and interpreted the cardiac monitored which showed an underlying rhythm of: Normal sinus rhythm, no ST/T wave changes, no ischemic changes, no STEMI, consistent with previous   Consultations Obtained:  I requested consultation with the neurology, Dr. Renna Cary,  and discussed lab and imaging findings as well as pertinent plan - they recommend: Admission to [facility] [place]   Problem List / ED Course / Critical interventions / Medication management  Patient is doing well at this time and does remain stable.  Patient's workup in the emergency department  has been fairly unremarkable thus far with an EKG with no acute ischemic changes and consistent with previous.  Troponin is within normal limits at this time.  Chest pain has improved with treatment in the emergency department.  Did discuss patient case with cardiology given his ongoing exertional symptoms.  Cardiology is in agreement to plan for admission at this time for further cardiac workup.  He did recommend transfer to [facility].  Did discuss patient case with Dr. Debroah Fanning with the hospitalist service who has excepted at this time. I ordered medication including aspirin , morphine , Zofran  for chest pain Reevaluation of the patient after these medicines showed that the patient improved I have reviewed the patients home medicines and have made adjustments as needed   Social Determinants of Health:  None   Test / Admission - Considered:  Admission        Final Clinical Impression(s) / ED Diagnoses Final diagnoses:  Chest pain, unspecified type    Rx / DC Orders ED Discharge Orders     None         Roselynn Connors, PA-C 02/05/24 1624    Mordecai Applebaum, MD 02/07/24 1242

## 2024-02-05 NOTE — Progress Notes (Signed)
 PHARMACIST - PHYSICIAN COMMUNICATION  CONCERNING:  Enoxaparin  (Lovenox ) for DVT Prophylaxis   RECOMMENDATION: Patient was prescribed enoxaprin 40mg  q24 hours for VTE prophylaxis.   Filed Weights   02/05/24 1421  Weight: 93.9 kg (207 lb)   Body mass index is 35.53 kg/m.  Estimated Creatinine Clearance: 57.3 mL/min (A) (by C-G formula based on SCr of 1.47 mg/dL (H)).  Based on [facility] policy patient is candidate for enoxaparin  0.5mg /kg TBW SQ every 24 hours based on BMI being >30.  DESCRIPTION: Pharmacy has adjusted enoxaparin  dose per [facility] policy.  Patient is now receiving enoxaparin  45 mg every 24 hours   Craven Do, PharmD Pharmacy Resident  02/05/2024 7:11 PM

## 2024-02-05 NOTE — Assessment & Plan Note (Addendum)
 Stable. -Resume carvedilol , Imdur  - Hold olmesartan for contrast exposure

## 2024-02-05 NOTE — Assessment & Plan Note (Signed)
-   SSI- S - HgbA1c - Hold Metformin  while inpatient

## 2024-02-05 NOTE — ED Triage Notes (Signed)
 Pt c/o of chest pain that has gotten worse over the last month. Endorses SOB after walking short distances

## 2024-02-06 ENCOUNTER — Encounter (HOSPITAL_COMMUNITY)
Admission: EM | Disposition: A | Discharge status: Home / Self Care | Payer: Self-pay | Source: Home / Self Care | Attending: Emergency Medicine

## 2024-02-06 DIAGNOSIS — E785 Hyperlipidemia, unspecified: Secondary | ICD-10-CM

## 2024-02-06 DIAGNOSIS — Z79899 Other long term (current) drug therapy: Secondary | ICD-10-CM | POA: Diagnosis not present

## 2024-02-06 DIAGNOSIS — N1831 Chronic kidney disease, stage 3a: Secondary | ICD-10-CM | POA: Diagnosis not present

## 2024-02-06 DIAGNOSIS — I959 Hypotension, unspecified: Secondary | ICD-10-CM | POA: Diagnosis not present

## 2024-02-06 DIAGNOSIS — I25810 Atherosclerosis of coronary artery bypass graft(s) without angina pectoris: Secondary | ICD-10-CM

## 2024-02-06 DIAGNOSIS — I255 Ischemic cardiomyopathy: Secondary | ICD-10-CM | POA: Diagnosis not present

## 2024-02-06 DIAGNOSIS — R079 Chest pain, unspecified: Secondary | ICD-10-CM | POA: Diagnosis not present

## 2024-02-06 DIAGNOSIS — Z951 Presence of aortocoronary bypass graft: Secondary | ICD-10-CM | POA: Diagnosis not present

## 2024-02-06 DIAGNOSIS — I200 Unstable angina: Secondary | ICD-10-CM | POA: Diagnosis not present

## 2024-02-06 DIAGNOSIS — I10 Essential (primary) hypertension: Secondary | ICD-10-CM | POA: Diagnosis not present

## 2024-02-06 DIAGNOSIS — Z7902 Long term (current) use of antithrombotics/antiplatelets: Secondary | ICD-10-CM | POA: Diagnosis not present

## 2024-02-06 DIAGNOSIS — I2510 Atherosclerotic heart disease of native coronary artery without angina pectoris: Secondary | ICD-10-CM | POA: Diagnosis not present

## 2024-02-06 DIAGNOSIS — Z7984 Long term (current) use of oral hypoglycemic drugs: Secondary | ICD-10-CM | POA: Diagnosis not present

## 2024-02-06 DIAGNOSIS — J45909 Unspecified asthma, uncomplicated: Secondary | ICD-10-CM | POA: Diagnosis not present

## 2024-02-06 DIAGNOSIS — F17210 Nicotine dependence, cigarettes, uncomplicated: Secondary | ICD-10-CM | POA: Diagnosis not present

## 2024-02-06 DIAGNOSIS — E119 Type 2 diabetes mellitus without complications: Secondary | ICD-10-CM | POA: Diagnosis not present

## 2024-02-06 HISTORY — PX: LEFT HEART CATH AND CORS/GRAFTS ANGIOGRAPHY: CATH118250

## 2024-02-06 LAB — BASIC METABOLIC PANEL WITH GFR
Anion gap: 7 (ref 5–15)
Anion gap: 8 (ref 5–15)
BUN: 14 mg/dL (ref 6–20)
BUN: 14 mg/dL (ref 6–20)
CO2: 28 mmol/L (ref 22–32)
CO2: 28 mmol/L (ref 22–32)
Calcium: 8.1 mg/dL — ABNORMAL LOW (ref 8.9–10.3)
Calcium: 8.3 mg/dL — ABNORMAL LOW (ref 8.9–10.3)
Chloride: 100 mmol/L (ref 98–111)
Chloride: 99 mmol/L (ref 98–111)
Creatinine, Ser: 1.54 mg/dL — ABNORMAL HIGH (ref 0.61–1.24)
Creatinine, Ser: 1.56 mg/dL — ABNORMAL HIGH (ref 0.61–1.24)
GFR, Estimated: 52 mL/min — ABNORMAL LOW (ref >60)
GFR, Estimated: 51 mL/min — ABNORMAL LOW (ref >60)
Glucose, Bld: 89 mg/dL (ref 70–99)
Glucose, Bld: 89 mg/dL (ref 70–99)
Potassium: 3.8 mmol/L (ref 3.5–5.1)
Potassium: 3.8 mmol/L (ref 3.5–5.1)
Sodium: 135 mmol/L (ref 135–145)
Sodium: 135 mmol/L (ref 135–145)

## 2024-02-06 LAB — GLUCOSE, CAPILLARY
Glucose-Capillary: 96 mg/dL (ref 70–99)
Glucose-Capillary: 129 mg/dL — ABNORMAL HIGH (ref 70–99)
Glucose-Capillary: 106 mg/dL — ABNORMAL HIGH (ref 70–99)

## 2024-02-06 LAB — CBC
HCT: 34.2 % — ABNORMAL LOW (ref 39.0–52.0)
Hemoglobin: 11.7 g/dL — ABNORMAL LOW (ref 13.0–17.0)
MCH: 36.3 pg — ABNORMAL HIGH (ref 26.0–34.0)
MCHC: 34.2 g/dL (ref 30.0–36.0)
MCV: 106.2 fL — ABNORMAL HIGH (ref 80.0–100.0)
Platelets: 153 10*3/uL (ref 150–400)
RBC: 3.22 MIL/uL — ABNORMAL LOW (ref 4.22–5.81)
RDW: 15.1 % (ref 11.5–15.5)
WBC: 5.6 10*3/uL (ref 4.0–10.5)
nRBC: 0 % (ref 0.0–0.2)

## 2024-02-06 LAB — TROPONIN I (HIGH SENSITIVITY)
Troponin I (High Sensitivity): 9 ng/L (ref <18)
Troponin I (High Sensitivity): 9 ng/L (ref <18)

## 2024-02-06 LAB — HEMOGLOBIN A1C
Hgb A1c MFr Bld: 5.3 % (ref 4.8–5.6)
Mean Plasma Glucose: 105.41 mg/dL

## 2024-02-06 LAB — HIV ANTIBODY (ROUTINE TESTING W REFLEX): HIV Screen 4th Generation wRfx: NONREACTIVE

## 2024-02-06 SURGERY — LEFT HEART CATH AND CORS/GRAFTS ANGIOGRAPHY
Anesthesia: LOCAL

## 2024-02-06 MED ORDER — FENTANYL CITRATE (PF) 100 MCG/2ML IJ SOLN
INTRAMUSCULAR | Status: AC
  Filled 2024-02-06: qty 2

## 2024-02-06 MED ORDER — HYDRALAZINE HCL 20 MG/ML IJ SOLN: 10.0000 mg | INTRAMUSCULAR | Status: AC | PRN

## 2024-02-06 MED ORDER — LIDOCAINE HCL (PF) 1 % IJ SOLN
INTRAMUSCULAR | Status: AC
  Filled 2024-02-06: qty 30

## 2024-02-06 MED ORDER — ISOSORBIDE MONONITRATE ER 60 MG PO TB24
60.0000 mg | ORAL_TABLET | Freq: Every day | ORAL | Status: DC
Start: 2024-02-06 — End: 2024-02-07
  Administered 2024-02-06 – 2024-02-07 (×2): 60 mg via ORAL
  Filled 2024-02-06 (×2): qty 1

## 2024-02-06 MED ORDER — SODIUM CHLORIDE 0.9% FLUSH
3.0000 mL | Freq: Two times a day (BID) | INTRAVENOUS | Status: DC
  Administered 2024-02-06: 3 mL via INTRAVENOUS

## 2024-02-06 MED ORDER — LIDOCAINE HCL (PF) 1 % IJ SOLN
INTRAMUSCULAR | Status: DC | PRN
  Administered 2024-02-06: 8 mL via INTRADERMAL

## 2024-02-06 MED ORDER — FENTANYL CITRATE (PF) 100 MCG/2ML IJ SOLN
INTRAMUSCULAR | Status: DC | PRN
  Administered 2024-02-06: 25 ug via INTRAVENOUS

## 2024-02-06 MED ORDER — IOHEXOL 350 MG/ML SOLN
INTRAVENOUS | Status: DC | PRN
  Administered 2024-02-06: 35 mL

## 2024-02-06 MED ORDER — MIDAZOLAM HCL 2 MG/2ML IJ SOLN
INTRAMUSCULAR | Status: DC | PRN
  Administered 2024-02-06: 1 mg via INTRAVENOUS

## 2024-02-06 MED ORDER — SODIUM CHLORIDE 0.9 % IV SOLN: 250.0000 mL | INTRAVENOUS | Status: DC | PRN

## 2024-02-06 MED ORDER — MORPHINE SULFATE (PF) 2 MG/ML IV SOLN
2.0000 mg | Freq: Once | INTRAVENOUS | Status: AC
  Administered 2024-02-06: 2 mg via INTRAVENOUS
  Filled 2024-02-06: qty 1

## 2024-02-06 MED ORDER — SODIUM CHLORIDE 0.9% FLUSH: 3.0000 mL | INTRAVENOUS | Status: DC | PRN

## 2024-02-06 MED ORDER — MORPHINE SULFATE (PF) 2 MG/ML IV SOLN
1.0000 mg | INTRAVENOUS | Status: DC | PRN
  Administered 2024-02-06: 1 mg via INTRAVENOUS
  Filled 2024-02-06: qty 1

## 2024-02-06 MED ORDER — ACETAMINOPHEN 325 MG PO TABS: 650.0000 mg | ORAL_TABLET | ORAL | Status: DC | PRN

## 2024-02-06 MED ORDER — SODIUM CHLORIDE 0.9 % IV SOLN: INTRAVENOUS | Status: AC

## 2024-02-06 MED ORDER — ASPIRIN 81 MG PO CHEW
81.0000 mg | CHEWABLE_TABLET | ORAL | Status: AC
  Administered 2024-02-06: 81 mg via ORAL
  Filled 2024-02-06: qty 1

## 2024-02-06 MED ORDER — LABETALOL HCL 5 MG/ML IV SOLN: 10.0000 mg | INTRAVENOUS | Status: AC | PRN

## 2024-02-06 MED ORDER — MIDAZOLAM HCL 2 MG/2ML IJ SOLN
INTRAMUSCULAR | Status: AC
  Filled 2024-02-06: qty 2

## 2024-02-06 MED ORDER — ROSUVASTATIN CALCIUM 20 MG PO TABS
40.0000 mg | ORAL_TABLET | Freq: Every day | ORAL | Status: DC
  Administered 2024-02-06 – 2024-02-07 (×2): 40 mg via ORAL
  Filled 2024-02-06 (×2): qty 2

## 2024-02-06 SURGICAL SUPPLY — 13 items
CATH INFINITI 5 FR AR1 MOD (CATHETERS) IMPLANT
CATH INFINITI 5 FR IM (CATHETERS) IMPLANT
CATH INFINITI 5 FR LCB (CATHETERS) IMPLANT
CATH INFINITI 5FR MULTPACK ANG (CATHETERS) IMPLANT
CLOSURE MYNX CONTROL 5F (Vascular Products) IMPLANT
ELECT DEFIB PAD ADLT CADENCE (PAD) IMPLANT
GUIDEWIRE INQWIRE 1.5J.035X260 (WIRE) IMPLANT
KIT MICROPUNCTURE NIT STIFF (SHEATH) IMPLANT
PACK CARDIAC CATHETERIZATION (CUSTOM PROCEDURE TRAY) ×2 IMPLANT
SET ATX-X65L (MISCELLANEOUS) IMPLANT
SHEATH PINNACLE 5F 10CM (SHEATH) IMPLANT
SHEATH PROBE COVER 6X72 (BAG) IMPLANT
WIRE EMERALD 3MM-J .035X150CM (WIRE) IMPLANT

## 2024-02-06 NOTE — Progress Notes (Addendum)
 PROGRESS NOTE    [name] [name]  UEA:[id number] DOB: 03/20/1966 DOA: 02/05/2024 PCP: Veda Gerald, MD   Brief Narrative: 58 year old with past medical history significant for CABG 2003, ischemic cardiomyopathy, diabetes, hypertension, bipolar, polysubstance abuse presents complaining of chest pain over the last several months getting worse.  Reports chest pain on exertion and at rest.  He has been compliant with aspirin  but uncertain about Plavix .  Cardiology has been consulted and plan is for heart cath today.   Assessment & Plan:   Principal Problem:   Chest pain Active Problems:   CAD S/P percutaneous coronary angioplasty - PCI of SVG-OM   Polysubstance abuse (HCC)   Essential hypertension, benign   Cardiomyopathy, ischemic   DM type 2 (diabetes mellitus, type 2) (HCC)   1-Chest pain History of CABG Troponins negative Concern for angina Cath 2021 he has chronically occluded SVG to RCA at that time medical management was recommended. -Continue aspirin , Plavix , will hold  carvedilol  Imdur  due to hypotension.  Report chest pain, pressure since 5 am, he just mention it to me. 6/10. Nurse will inform cardiology, will repeat EKG and troponin.   Diabetes type 2: -Continue to hold metformin  -Monitor CBG, currently NPO  Ischemic cardiomyopathy: - Last echo 2022 ejection fraction 55% - Holding Lasix  for Today. Plan for cath today.   Essential hypertension: -Holding on losartan for contrast exposure -Hypotensive today. Plan to hold  carvedilol  and Imdur   Hypotension; hold meds. Cardiology informed.  He is getting low rate IV fluids.   CKD stage IIIa; last cr 1.7, GFR 45. Monitor renal function. Hold ARB pre cath   Estimated body mass index is 34.36 kg/m as calculated from the following:   Height as of this encounter: 5\' 4"  (1.626 m).   Weight as of this encounter: 90.8 kg.   DVT prophylaxis: Lovenox  Code Status: Full code Family Communication: Crae discussed with  patient Disposition Plan:  Status is: Observation The patient remains OBS appropriate and will d/c before 2 midnights.    Consultants:  Cardiology   Procedures:  Cath today  Antimicrobials:    Subjective: He is alert and conversant. Report having chest pressure ,mild 6/10 since 5 am, he has not mention it to anyone today.  He has had sleep study, advised top follow with his Dr to see if he needs CPAP> Objective: Vitals:   02/05/24 2258 02/06/24 0000 02/06/24 0016 02/06/24 0358  BP:   117/71 90/68  Pulse:   (!) 52 (!) 58  Resp:   16 18  Temp: 98.1 F (36.7 C)  98 F (36.7 C) (!) 97.5 F (36.4 C)  TempSrc: Oral  Oral Oral  SpO2:   100% 92%  Weight:  90.8 kg    Height:        Intake/Output Summary (Last 24 hours) at 02/06/2024 0655 Last data filed at 02/06/2024 0640 Gross per 24 hour  Intake 471.25 ml  Output 280 ml  Net 191.25 ml   Filed Weights   02/05/24 1421 02/06/24 0000  Weight: 93.9 kg 90.8 kg    Examination:  General exam: Appears calm and comfortable  Respiratory system: Clear to auscultation. Respiratory effort normal. Cardiovascular system: S1 & S2 heard, RRR. No JVD, murmurs, rubs, gallops or clicks. No pedal edema. Gastrointestinal system: Abdomen is nondistended, soft and nontender. No organomegaly or masses felt. Normal bowel sounds heard. Central nervous system: Alert and oriented. No focal neurological deficits. Extremities: Symmetric 5 x 5 power.    Data Reviewed:  I have personally reviewed following labs and imaging studies  CBC: Recent Labs  Lab 02/05/24 1445  WBC 5.4  NEUTROABS 3.5  HGB 12.9*  HCT 37.2*  MCV 106.0*  PLT 184   Basic Metabolic Panel: Recent Labs  Lab 02/05/24 1445  NA 135  K 3.9  CL 102  CO2 24  GLUCOSE 155*  BUN 14  CREATININE 1.47*  CALCIUM 9.0   GFR: Estimated Creatinine Clearance: 56.3 mL/min (A) (by C-G formula based on SCr of 1.47 mg/dL (H)). Liver Function Tests: Recent Labs  Lab  02/05/24 1445  AST 21  ALT 12  ALKPHOS 63  BILITOT 0.8  PROT 7.0  ALBUMIN 3.9   No results for input(s): "LIPASE", "AMYLASE" in the last 168 hours. No results for input(s): "AMMONIA" in the last 168 hours. Coagulation Profile: No results for input(s): "INR", "PROTIME" in the last 168 hours. Cardiac Enzymes: No results for input(s): "CKTOTAL", "CKMB", "CKMBINDEX", "TROPONINI" in the last 168 hours. BNP (last 3 results) No results for input(s): "PROBNP" in the last 8760 hours. HbA1C: No results for input(s): "HGBA1C" in the last 72 hours. CBG: No results for input(s): "GLUCAP" in the last 168 hours. Lipid Profile: No results for input(s): "CHOL", "HDL", "LDLCALC", "TRIG", "CHOLHDL", "LDLDIRECT" in the last 72 hours. Thyroid Function Tests: No results for input(s): "TSH", "T4TOTAL", "FREET4", "T3FREE", "THYROIDAB" in the last 72 hours. Anemia Panel: No results for input(s): "VITAMINB12", "FOLATE", "FERRITIN", "TIBC", "IRON", "RETICCTPCT" in the last 72 hours. Sepsis Labs: No results for input(s): "PROCALCITON", "LATICACIDVEN" in the last 168 hours.  No results found for this or any previous visit (from the past 240 hours).       Radiology Studies: DG Chest Port 1 View Result Date: 02/05/2024 CLINICAL DATA:  Chest pain and shortness of breath EXAM: PORTABLE CHEST 1 VIEW COMPARISON:  07/19/2023 FINDINGS: Postoperative changes in the mediastinum. Heart size and pulmonary vascularity are normal. Emphysematous changes in the lungs. No airspace disease or consolidation. No pleural effusion or pneumothorax. Mediastinal contours appear intact. IMPRESSION: Emphysematous changes in the lungs.  No focal consolidation. Electronically Signed   By: Boyce Byes M.D.   On: 02/05/2024 15:22        Scheduled Meds:  allopurinol   300 mg Oral Daily   aspirin  EC  81 mg Oral Daily   carvedilol   3.125 mg Oral BID WC   clopidogrel   75 mg Oral Daily   diazepam   10 mg Oral BID   enoxaparin   (LOVENOX ) injection  45 mg Subcutaneous Q24H   isosorbide  mononitrate  60 mg Oral BID   pantoprazole   40 mg Oral Daily   pravastatin   40 mg Oral Daily   ranolazine   1,000 mg Oral BID   Continuous Infusions:  sodium chloride  75 mL/hr (02/06/24 0023)     LOS: 0 days    Time spent: 35 minutes    Rory Montel A Graclynn Vanantwerp, MD [facility]   If 7PM-7AM, please contact night-coverage [email or web address]  02/06/2024, 6:55 AM

## 2024-02-06 NOTE — Plan of Care (Signed)
  Problem: Education: Goal: Knowledge of General Education information will improve Description: Including pain rating scale, medication(s)/side effects and non-pharmacologic comfort measures Outcome: Progressing   Problem: Health Behavior/Discharge Planning: Goal: Ability to manage health-related needs will improve Outcome: Progressing   Problem: Clinical Measurements: Goal: Will remain free from infection Outcome: Progressing   Problem: Nutrition: Goal: Adequate nutrition will be maintained Outcome: Progressing   Problem: Safety: Goal: Ability to remain free from injury will improve Outcome: Progressing   Problem: Clinical Measurements: Goal: Ability to maintain clinical measurements within normal limits will improve Outcome: Not Progressing Goal: Diagnostic test results will improve Outcome: Not Progressing Goal: Respiratory complications will improve Outcome: Not Progressing   Problem: Activity: Goal: Risk for activity intolerance will decrease Outcome: Not Progressing   Problem: Pain Managment: Goal: General experience of comfort will improve and/or be controlled Outcome: Not Progressing

## 2024-02-06 NOTE — Interval H&P Note (Signed)
 History and Physical Interval Note:  02/06/2024 2:31 PM  [name] [name]  has presented today for surgery, with the diagnosis of CHEST PAIN.  The various methods of treatment have been discussed with the patient and family. After consideration of risks, benefits and other options for treatment, the patient has consented to  Procedure(s): LEFT HEART CATH AND CORS/GRAFTS ANGIOGRAPHY (N/A) as a surgical intervention.  The patient's history has been reviewed, patient examined, no change in status, stable for surgery.  I have reviewed the patient's chart and labs.  Questions were answered to the patient's satisfaction.     Wm Sahagun J Yuchen Fedor

## 2024-02-06 NOTE — H&P (View-Only) (Signed)
 Rounding Note    Patient Name: [name] [name] Date of Encounter: 02/06/2024  [facility] Cardiologist: Teddie Favre, MD   Subjective   No CP or dyspnea  Inpatient Medications    Scheduled Meds:  allopurinol   300 mg Oral Daily   aspirin  EC  81 mg Oral Daily   carvedilol   3.125 mg Oral BID WC   clopidogrel   75 mg Oral Daily   diazepam   10 mg Oral BID   enoxaparin  (LOVENOX ) injection  45 mg Subcutaneous Q24H   isosorbide  mononitrate  60 mg Oral BID   pantoprazole   40 mg Oral Daily   pravastatin   40 mg Oral Daily   ranolazine   1,000 mg Oral BID   Continuous Infusions:  sodium chloride  75 mL/hr (02/06/24 0023)   PRN Meds: acetaminophen  **OR** acetaminophen , morphine  injection, nitroGLYCERIN , ondansetron  **OR** ondansetron  (ZOFRAN ) IV, polyethylene glycol   Vital Signs    Vitals:   02/06/24 0000 02/06/24 0016 02/06/24 0358 02/06/24 0813  BP:  117/71 90/68 (!) 83/54  Pulse:  (!) 52 (!) 58 (!) 55  Resp:  16 18   Temp:  98 F (36.7 C) (!) 97.5 F (36.4 C) 98.3 F (36.8 C)  TempSrc:  Oral Oral Oral  SpO2:  100% 92% (!) 86%  Weight: 90.8 kg     Height:        Intake/Output Summary (Last 24 hours) at 02/06/2024 0835 Last data filed at 02/06/2024 0640 Gross per 24 hour  Intake 471.25 ml  Output 280 ml  Net 191.25 ml      02/06/2024   12:00 AM 02/05/2024    2:21 PM 12/03/2023   10:23 AM  Last 3 Weights  Weight (lbs) 200 lb 2.8 oz 207 lb 198 lb  Weight (kg) 90.8 kg 93.895 kg 89.812 kg      Telemetry    Sinus - Personally Reviewed   Physical Exam   GEN: No acute distress.   Neck: No JVD Cardiac: RRR, no murmurs, rubs, or gallops.  Respiratory: Clear to auscultation bilaterally. GI: Soft, nontender, non-distended  MS: No edema Neuro:  Nonfocal  Psych: Normal affect   Labs    High Sensitivity Troponin:   Recent Labs  Lab 02/05/24 1445 02/05/24 1621  TROPONINIHS 9 9     Chemistry Recent Labs  Lab 02/05/24 1445 02/06/24 0302   NA 135 135  K 3.9 3.8  CL 102 100  CO2 24 28  GLUCOSE 155* 89  BUN 14 14  CREATININE 1.47* 1.54*  CALCIUM 9.0 8.1*  PROT 7.0  --   ALBUMIN 3.9  --   AST 21  --   ALT 12  --   ALKPHOS 63  --   BILITOT 0.8  --   GFRNONAA 55* 52*  ANIONGAP 9 7    Hematology Recent Labs  Lab 02/05/24 1445 02/06/24 0302  WBC 5.4 5.6  RBC 3.51* 3.22*  HGB 12.9* 11.7*  HCT 37.2* 34.2*  MCV 106.0* 106.2*  MCH 36.8* 36.3*  MCHC 34.7 34.2  RDW 15.3 15.1  PLT 184 153    BNP Recent Labs  Lab 02/05/24 1445  BNP 176.0*      Radiology    DG Chest Port 1 View Result Date: 02/05/2024 CLINICAL DATA:  Chest pain and shortness of breath EXAM: PORTABLE CHEST 1 VIEW COMPARISON:  07/19/2023 FINDINGS: Postoperative changes in the mediastinum. Heart size and pulmonary vascularity are normal. Emphysematous changes in the lungs. No airspace disease or consolidation. No  pleural effusion or pneumothorax. Mediastinal contours appear intact. IMPRESSION: Emphysematous changes in the lungs.  No focal consolidation. Electronically Signed   By: Boyce Byes M.D.   On: 02/05/2024 15:22    Patient Profile     58 y.o. male with past medical history of coronary artery disease status post coronary bypass and graft, ischemic cardiomyopathy, diabetes mellitus, bipolar disorder, tobacco abuse admitted with unstable angina and transferred for cardiac catheterization.  Most recent echocardiogram September 2022 showed ejection fraction 55%, mild biatrial enlargement.  Assessment & Plan    1 unstable angina-patient is pain-free this morning.  Per Dr. Renna Cary plan is cardiac catheterization.  The risk and benefits including myocardial infarction, CVA and death discussed and he agrees to proceed.  Note he also has baseline renal insufficiency.  He has been hydrated overnight.  Limit dye.  Arrange echocardiogram to assess LV function.  Continue aspirin , statin, low-dose carvedilol ; decrease isosorbide  to 60 mg daily as blood  pressure is low.  2 chronic stage IIIa kidney disease-follow renal function after procedure.  He has been hydrated as outlined above.  3 hyperlipidemia-given coronary artery disease will discontinue pravastatin  and instead treat with Crestor 40 mg daily.  Check lipids and liver in 8 weeks.  Goal LDL less than 55.  For questions or updates, please contact [facility] Please consult [email or web address] for contact info under        Signed, Alexandria Angel, MD  02/06/2024, 8:35 AM

## 2024-02-06 NOTE — Progress Notes (Signed)
 Rounding Note    Patient Name: [name] [name] Date of Encounter: 02/06/2024  [facility] Cardiologist: Teddie Favre, MD   Subjective   No CP or dyspnea  Inpatient Medications    Scheduled Meds:  allopurinol   300 mg Oral Daily   aspirin  EC  81 mg Oral Daily   carvedilol   3.125 mg Oral BID WC   clopidogrel   75 mg Oral Daily   diazepam   10 mg Oral BID   enoxaparin  (LOVENOX ) injection  45 mg Subcutaneous Q24H   isosorbide  mononitrate  60 mg Oral BID   pantoprazole   40 mg Oral Daily   pravastatin   40 mg Oral Daily   ranolazine   1,000 mg Oral BID   Continuous Infusions:  sodium chloride  75 mL/hr (02/06/24 0023)   PRN Meds: acetaminophen  **OR** acetaminophen , morphine  injection, nitroGLYCERIN , ondansetron  **OR** ondansetron  (ZOFRAN ) IV, polyethylene glycol   Vital Signs    Vitals:   02/06/24 0000 02/06/24 0016 02/06/24 0358 02/06/24 0813  BP:  117/71 90/68 (!) 83/54  Pulse:  (!) 52 (!) 58 (!) 55  Resp:  16 18   Temp:  98 F (36.7 C) (!) 97.5 F (36.4 C) 98.3 F (36.8 C)  TempSrc:  Oral Oral Oral  SpO2:  100% 92% (!) 86%  Weight: 90.8 kg     Height:        Intake/Output Summary (Last 24 hours) at 02/06/2024 0835 Last data filed at 02/06/2024 0640 Gross per 24 hour  Intake 471.25 ml  Output 280 ml  Net 191.25 ml      02/06/2024   12:00 AM 02/05/2024    2:21 PM 12/03/2023   10:23 AM  Last 3 Weights  Weight (lbs) 200 lb 2.8 oz 207 lb 198 lb  Weight (kg) 90.8 kg 93.895 kg 89.812 kg      Telemetry    Sinus - Personally Reviewed   Physical Exam   GEN: No acute distress.   Neck: No JVD Cardiac: RRR, no murmurs, rubs, or gallops.  Respiratory: Clear to auscultation bilaterally. GI: Soft, nontender, non-distended  MS: No edema Neuro:  Nonfocal  Psych: Normal affect   Labs    High Sensitivity Troponin:   Recent Labs  Lab 02/05/24 1445 02/05/24 1621  TROPONINIHS 9 9     Chemistry Recent Labs  Lab 02/05/24 1445 02/06/24 0302   NA 135 135  K 3.9 3.8  CL 102 100  CO2 24 28  GLUCOSE 155* 89  BUN 14 14  CREATININE 1.47* 1.54*  CALCIUM 9.0 8.1*  PROT 7.0  --   ALBUMIN 3.9  --   AST 21  --   ALT 12  --   ALKPHOS 63  --   BILITOT 0.8  --   GFRNONAA 55* 52*  ANIONGAP 9 7    Hematology Recent Labs  Lab 02/05/24 1445 02/06/24 0302  WBC 5.4 5.6  RBC 3.51* 3.22*  HGB 12.9* 11.7*  HCT 37.2* 34.2*  MCV 106.0* 106.2*  MCH 36.8* 36.3*  MCHC 34.7 34.2  RDW 15.3 15.1  PLT 184 153    BNP Recent Labs  Lab 02/05/24 1445  BNP 176.0*      Radiology    DG Chest Port 1 View Result Date: 02/05/2024 CLINICAL DATA:  Chest pain and shortness of breath EXAM: PORTABLE CHEST 1 VIEW COMPARISON:  07/19/2023 FINDINGS: Postoperative changes in the mediastinum. Heart size and pulmonary vascularity are normal. Emphysematous changes in the lungs. No airspace disease or consolidation. No  pleural effusion or pneumothorax. Mediastinal contours appear intact. IMPRESSION: Emphysematous changes in the lungs.  No focal consolidation. Electronically Signed   By: Boyce Byes M.D.   On: 02/05/2024 15:22    Patient Profile     58 y.o. male with past medical history of coronary artery disease status post coronary bypass and graft, ischemic cardiomyopathy, diabetes mellitus, bipolar disorder, tobacco abuse admitted with unstable angina and transferred for cardiac catheterization.  Most recent echocardiogram September 2022 showed ejection fraction 55%, mild biatrial enlargement.  Assessment & Plan    1 unstable angina-patient is pain-free this morning.  Per Dr. Renna Cary plan is cardiac catheterization.  The risk and benefits including myocardial infarction, CVA and death discussed and he agrees to proceed.  Note he also has baseline renal insufficiency.  He has been hydrated overnight.  Limit dye.  Arrange echocardiogram to assess LV function.  Continue aspirin , statin, low-dose carvedilol ; decrease isosorbide  to 60 mg daily as blood  pressure is low.  2 chronic stage IIIa kidney disease-follow renal function after procedure.  He has been hydrated as outlined above.  3 hyperlipidemia-given coronary artery disease will discontinue pravastatin  and instead treat with Crestor 40 mg daily.  Check lipids and liver in 8 weeks.  Goal LDL less than 55.  For questions or updates, please contact [facility] Please consult [email or web address] for contact info under        Signed, Alexandria Angel, MD  02/06/2024, 8:35 AM

## 2024-02-07 ENCOUNTER — Telehealth: Payer: Self-pay | Admitting: Cardiology

## 2024-02-07 ENCOUNTER — Encounter (HOSPITAL_COMMUNITY): Payer: Self-pay | Admitting: Cardiology

## 2024-02-07 DIAGNOSIS — I200 Unstable angina: Secondary | ICD-10-CM | POA: Diagnosis not present

## 2024-02-07 DIAGNOSIS — E785 Hyperlipidemia, unspecified: Secondary | ICD-10-CM | POA: Diagnosis not present

## 2024-02-07 DIAGNOSIS — R079 Chest pain, unspecified: Secondary | ICD-10-CM | POA: Diagnosis not present

## 2024-02-07 LAB — BASIC METABOLIC PANEL WITH GFR
Anion gap: 8 (ref 5–15)
BUN: 13 mg/dL (ref 6–20)
CO2: 26 mmol/L (ref 22–32)
Calcium: 8.5 mg/dL — ABNORMAL LOW (ref 8.9–10.3)
Chloride: 102 mmol/L (ref 98–111)
Creatinine, Ser: 1.53 mg/dL — ABNORMAL HIGH (ref 0.61–1.24)
GFR, Estimated: 53 mL/min — ABNORMAL LOW (ref >60)
Glucose, Bld: 88 mg/dL (ref 70–99)
Potassium: 4.3 mmol/L (ref 3.5–5.1)
Sodium: 136 mmol/L (ref 135–145)

## 2024-02-07 LAB — GLUCOSE, CAPILLARY: Glucose-Capillary: 93 mg/dL (ref 70–99)

## 2024-02-07 MED ORDER — METFORMIN HCL 1000 MG PO TABS: 1000.0000 mg | ORAL_TABLET | Freq: Every day | ORAL | 0 refills | Status: AC

## 2024-02-07 MED ORDER — ISOSORBIDE MONONITRATE ER 60 MG PO TB24: 60.0000 mg | ORAL_TABLET | Freq: Every day | ORAL | 0 refills | Status: DC

## 2024-02-07 MED ORDER — ROSUVASTATIN CALCIUM 40 MG PO TABS: 40.0000 mg | ORAL_TABLET | Freq: Every day | ORAL | 1 refills | Status: DC

## 2024-02-07 NOTE — Plan of Care (Signed)
  Problem: Education: Goal: Knowledge of General Education information will improve Description: Including pain rating scale, medication(s)/side effects and non-pharmacologic comfort measures Outcome: Progressing   Problem: Health Behavior/Discharge Planning: Goal: Ability to manage health-related needs will improve Outcome: Progressing   Problem: Clinical Measurements: Goal: Will remain free from infection Outcome: Progressing Goal: Respiratory complications will improve Outcome: Progressing Goal: Cardiovascular complication will be avoided Outcome: Progressing   Problem: Activity: Goal: Risk for activity intolerance will decrease Outcome: Progressing   Problem: Elimination: Goal: Will not experience complications related to bowel motility Outcome: Progressing Goal: Will not experience complications related to urinary retention Outcome: Progressing   Problem: Safety: Goal: Ability to remain free from injury will improve Outcome: Progressing   Problem: Education: Goal: Understanding of CV disease, CV risk reduction, and recovery process will improve Outcome: Progressing   Problem: Activity: Goal: Ability to return to baseline activity level will improve Outcome: Progressing   Problem: Cardiovascular: Goal: Ability to achieve and maintain adequate cardiovascular perfusion will improve Outcome: Progressing Goal: Vascular access site(s) Level 0-1 will be maintained Outcome: Progressing

## 2024-02-07 NOTE — Discharge Summary (Signed)
 Physician Discharge Summary   Patient: [name] [name] MRN: [id number] DOB: 24-Dec-1965  Admit date:     02/05/2024  Discharge date: 02/07/24  Discharge Physician: Danette Duos   PCP: Veda Gerald, MD   Recommendations at discharge:   Close follow up with cardiology for further care of CAD, HF.  Needs Bmet to monitor renal function.  Monitor cr, consider different therapy for Dm depending on renal function  Discharge Diagnoses: Principal Problem:   Chest pain Active Problems:   CAD S/P percutaneous coronary angioplasty - PCI of SVG-OM   Polysubstance abuse (HCC)   Essential hypertension, benign   Cardiomyopathy, ischemic   DM type 2 (diabetes mellitus, type 2) (HCC)  Resolved Problems:   * No resolved hospital problems. [facility] Course: 58 year old with past medical history significant for CABG 2003, ischemic cardiomyopathy, diabetes, hypertension, bipolar, polysubstance abuse presents complaining of chest pain over the last several months getting worse.  Reports chest pain on exertion and at rest.  He has been compliant with aspirin  but uncertain about Plavix .  Cardiology has been consulted and plan is for heart cath today.    Assessment and Plan: 1-Chest pain History of CABG Troponins negative Concern for angina Cath 2021 he has chronically occluded SVG to RCA at that time medical management was recommended. -Continue aspirin , Plavix , carvedilol . BP improved.  -cath; no target intervention. Cardiology recommend medical management, change Pravachol  to Crestor . Imdur  daily.   chest pain free  Diabetes type 2: -resume Metformin  48 hours post cath -Monitor CBG, currently NPO   Ischemic cardiomyopathy: - Last echo 2022 ejection fraction 55% -ok to resume lasix  per cardio at discharge No target intervention by cath, medical management.    Essential hypertension: -Holding on losartan for contrast exposure and soft BPT -   Hypotension; hold meds. Cardiology  informed.  He is getting low rate IV fluids.   BP improved 110. On low dose carvedilol , and imdur  change to daily.   CKD stage IIIa; last cr 1.7, GFR 45. Monitor renal function. Hold ARB pre cath   monitor.      Consultants: cardiology  Procedures performed: Cath Disposition: Home Diet recommendation:  Discharge Diet Orders (From admission, onward)     Start     Ordered   02/07/24 0000  Diet - low sodium heart healthy        02/07/24 0833           Cardiac diet DISCHARGE MEDICATION: Allergies as of 02/07/2024   No Known Allergies      Medication List     STOP taking these medications    olmesartan 5 MG tablet Commonly known as: BENICAR   pravastatin  40 MG tablet Commonly known as: PRAVACHOL    ranolazine  1000 MG SR tablet Commonly known as: RANEXA        TAKE these medications    acetaminophen  325 MG tablet Commonly known as: TYLENOL  Take 2 tablets (650 mg total) by mouth every 4 (four) hours as needed for headache or mild pain.   allopurinol  300 MG tablet Commonly known as: ZYLOPRIM  Take 300 mg by mouth daily.   aspirin  EC 81 MG tablet Take 1 tablet (81 mg total) by mouth daily.   Blood Pressure Monitoring Kit 1 each by Does not apply route as directed.   carvedilol  3.125 MG tablet Commonly known as: COREG  TAKE ONE TABLET BY MOUTH TWICE DAILY WITH MEALS   clopidogrel  75 MG tablet Commonly known as: PLAVIX  TAKE ONE TABLET BY MOUTH  DAILY   diazepam  10 MG tablet Commonly known as: VALIUM  Take 10 mg by mouth 2 (two) times daily.   furosemide  40 MG tablet Commonly known as: LASIX  TAKE ONE TABLET BY MOUTH DAILY   isosorbide  mononitrate 60 MG 24 hr tablet Commonly known as: IMDUR  Take 1 tablet (60 mg total) by mouth daily. What changed:  medication strength when to take this   Magnesium  Oxide -Mg Supplement 200 MG Tabs Take 1 tablet by mouth daily.   metFORMIN  1000 MG tablet Commonly known as: GLUCOPHAGE  Take 1 tablet (1,000 mg total)  by mouth daily with breakfast. Start taking on: Feb 08, 2024   nitroGLYCERIN  0.4 MG SL tablet Commonly known as: NITROSTAT  dissolve ONE tablet UNDER THE TONGUE every FIVE minutes FOR UP TO THREE doses if needed FOR CHEST pain. IF NO RELIEF AFTER 3RD DOSE, PROCEED TO ER OR CALL 911   omega-3 acid ethyl esters 1 g capsule Commonly known as: LOVAZA  Take 1 capsule by mouth every morning. & 2 capsules at night   OneTouch Verio test strip Generic drug: glucose blood   pantoprazole  40 MG tablet Commonly known as: PROTONIX  Take 40 mg by mouth daily.   potassium chloride  10 MEQ tablet Commonly known as: KLOR-CON  Take 10 mEq by mouth daily.   rosuvastatin  40 MG tablet Commonly known as: CRESTOR  Take 1 tablet (40 mg total) by mouth daily.        Follow-up Information     Veda Gerald, MD Follow up on 02/18/2024.   Specialty: Internal Medicine Why: 1:15 for hospital follow up Contact information: [address] [place] [address] [phone]                Discharge Exam: Filed Weights   02/05/24 1421 02/06/24 0000 02/07/24 0400  Weight: 93.9 kg 90.8 kg 91.6 kg   General; NAD  Condition at discharge: stable  The results of significant diagnostics from this hospitalization (including imaging, microbiology, ancillary and laboratory) are listed below for reference.   Imaging Studies: CARDIAC CATHETERIZATION Result Date: 02/06/2024 Images from the original result were not included. Coronary and bypass graft angiography 02/06/2024: LM: Distal 60% stenosis LAD: ostial 60% stenosis, followed by proximal 100% occlusion Ramus: Prox 80% stenosis, distally small caliber vessel (Unprotected) Lcx: Mld diffuse disease (Unprotected) RCA: Prox 100% occlusion (Unprotected) LIMA-LAD: Patent. Moderate disease in mid LAD beyond graft touchdown SVG_diag: Prox stent with minimal restenosis. Mid stent with 40% restenosis, slightly progressed since 2021. Jump graft portion supplying  Lcx/OM is known occluded. SVG-RCA: Known occluded, not engaged today LVEDP 19 mmHg Severe native vessel disease Patent LIMA-LAD, SVG-diag portion of jump graft with only moderate restenosis Exertional angina likely due to severe native vessel disease with no good targets for percutaneous intervention. Exertional dizziness unlikely to be related to CAD. Continue medical management, and uptitration of antianginal therapy as tolerated. Cody Das, MD   DG Chest Port 1 View Result Date: 02/05/2024 CLINICAL DATA:  Chest pain and shortness of breath EXAM: PORTABLE CHEST 1 VIEW COMPARISON:  07/19/2023 FINDINGS: Postoperative changes in the mediastinum. Heart size and pulmonary vascularity are normal. Emphysematous changes in the lungs. No airspace disease or consolidation. No pleural effusion or pneumothorax. Mediastinal contours appear intact. IMPRESSION: Emphysematous changes in the lungs.  No focal consolidation. Electronically Signed   By: Boyce Byes M.D.   On: 02/05/2024 15:22    Microbiology: Results for orders placed or performed during the hospital encounter of 07/01/21  Resp Panel by  RT-PCR (Flu A&B, Covid) Nasopharyngeal Swab     Status: None   Collection Time: 07/01/21  4:29 PM   Specimen: Nasopharyngeal Swab; Nasopharyngeal(NP) swabs in vial transport medium  Result Value Ref Range Status   SARS Coronavirus 2 by RT PCR NEGATIVE NEGATIVE Final    Comment: (NOTE) SARS-CoV-2 target nucleic acids are NOT DETECTED.  The SARS-CoV-2 RNA is generally detectable in upper respiratory specimens during the acute phase of infection. The lowest concentration of SARS-CoV-2 viral copies this assay can detect is 138 copies/mL. A negative result does not preclude SARS-Cov-2 infection and should not be used as the sole basis for treatment or other patient management decisions. A negative result may occur with  improper specimen collection/handling, submission of specimen other than  nasopharyngeal swab, presence of viral mutation(s) within the areas targeted by this assay, and inadequate number of viral copies(<138 copies/mL). A negative result must be combined with clinical observations, patient history, and epidemiological information. The expected result is Negative.  Fact Sheet for Patients:  BloggerCourse.com  Fact Sheet for Healthcare Providers:  SeriousBroker.it  This test is no t yet approved or cleared by the [place]  FDA and  has been authorized for detection and/or diagnosis of SARS-CoV-2 by FDA under an Emergency Use Authorization (EUA). This EUA will remain  in effect (meaning this test can be used) for the duration of the COVID-19 declaration under Section 564(b)(1) of the Act, 21 U.S.C.section 360bbb-3(b)(1), unless the authorization is terminated  or revoked sooner.       Influenza A by PCR NEGATIVE NEGATIVE Final   Influenza B by PCR NEGATIVE NEGATIVE Final    Comment: (NOTE) The Xpert Xpress SARS-CoV-2/FLU/RSV plus assay is intended as an aid in the diagnosis of influenza from Nasopharyngeal swab specimens and should not be used as a sole basis for treatment. Nasal washings and aspirates are unacceptable for Xpert Xpress SARS-CoV-2/FLU/RSV testing.  Fact Sheet for Patients: BloggerCourse.com  Fact Sheet for Healthcare Providers: SeriousBroker.it  This test is not yet approved or cleared by the [place]  FDA and has been authorized for detection and/or diagnosis of SARS-CoV-2 by FDA under an Emergency Use Authorization (EUA). This EUA will remain in effect (meaning this test can be used) for the duration of the COVID-19 declaration under Section 564(b)(1) of the Act, 21 U.S.C. section 360bbb-3(b)(1), unless the authorization is terminated or revoked.  Performed at [facility], [address], [place], [place] [address]      Labs: CBC: Recent Labs  Lab 02/05/24 1445 02/06/24 0302  WBC 5.4 5.6  NEUTROABS 3.5  --   HGB 12.9* 11.7*  HCT 37.2* 34.2*  MCV 106.0* 106.2*  PLT 184 153   Basic Metabolic Panel: Recent Labs  Lab 02/05/24 1445 02/06/24 0302 02/06/24 0724 02/07/24 0308  NA 135 135 135 136  K 3.9 3.8 3.8 4.3  CL 102 100 99 102  CO2 24 28 28 26   GLUCOSE 155* 89 89 88  BUN 14 14 14 13   CREATININE 1.47* 1.54* 1.56* 1.53*  CALCIUM  9.0 8.1* 8.3* 8.5*   Liver Function Tests: Recent Labs  Lab 02/05/24 1445  AST 21  ALT 12  ALKPHOS 63  BILITOT 0.8  PROT 7.0  ALBUMIN 3.9   CBG: Recent Labs  Lab 02/06/24 0936 02/06/24 1635 02/06/24 2115 02/07/24 0607  GLUCAP 96 129* 106* 93    Discharge time spent: greater than 30 minutes.  Signed: Danette Duos, MD [facility] 02/07/2024

## 2024-02-07 NOTE — TOC Transition Note (Signed)
 Transition of Care [facility]) - Discharge Note   Patient Details  Name: [name] [name] MRN: [id number] Date of Birth: 07-Aug-1966  [facility] CM/SW Contact:  Jennett Model, RN Phone Number: 02/07/2024, 9:00 AM   Clinical Narrative:     For dc today, has no needs.        Patient Goals and CMS Choice            Discharge Placement                       Discharge Plan and Services Additional resources added to the After Visit Summary for                                       Social Drivers of Health (SDOH) Interventions SDOH Screenings   Food Insecurity: No Food Insecurity (02/06/2024)  Housing: Low Risk  (02/06/2024)  Transportation Needs: No Transportation Needs (02/06/2024)  Utilities: Not At Risk (02/06/2024)  Social Connections: Unknown (02/06/2024)  Tobacco Use: High Risk (02/05/2024)     Readmission Risk Interventions     No data to display

## 2024-02-07 NOTE — Progress Notes (Addendum)
 Rounding Note    Patient Name: [name] [name] Date of Encounter: 02/07/2024  [facility] Cardiologist: Teddie Favre, MD   Subjective   Denies CP or dyspnea  Inpatient Medications    Scheduled Meds:  allopurinol   300 mg Oral Daily   aspirin  EC  81 mg Oral Daily   carvedilol   3.125 mg Oral BID WC   clopidogrel   75 mg Oral Daily   diazepam   10 mg Oral BID   enoxaparin  (LOVENOX ) injection  45 mg Subcutaneous Q24H   isosorbide  mononitrate  60 mg Oral Daily   pantoprazole   40 mg Oral Daily   ranolazine   1,000 mg Oral BID   rosuvastatin   40 mg Oral Daily   sodium chloride  flush  3 mL Intravenous Q12H   Continuous Infusions:  sodium chloride      PRN Meds: sodium chloride , acetaminophen , morphine  injection, nitroGLYCERIN , ondansetron  **OR** ondansetron  (ZOFRAN ) IV, polyethylene glycol, sodium chloride  flush   Vital Signs    Vitals:   02/06/24 2200 02/07/24 0000 02/07/24 0200 02/07/24 0400  BP: 108/72 (!) 91/50 107/66 105/62  Pulse: 62 61 60 61  Resp: 19 16 16 19   Temp:  98.1 F (36.7 C)  98.1 F (36.7 C)  TempSrc:  Oral  Oral  SpO2: 91% 92% 92% 93%  Weight:    91.6 kg  Height:    5\' 4"  (1.626 m)    Intake/Output Summary (Last 24 hours) at 02/07/2024 0814 Last data filed at 02/06/2024 1900 Gross per 24 hour  Intake 120 ml  Output --  Net 120 ml      02/07/2024    4:00 AM 02/06/2024   12:00 AM 02/05/2024    2:21 PM  Last 3 Weights  Weight (lbs) 201 lb 15.1 oz 200 lb 2.8 oz 207 lb  Weight (kg) 91.6 kg 90.8 kg 93.895 kg      Telemetry    Sinus - Personally Reviewed   Physical Exam   GEN: NAD Neck: supple Cardiac: RRR Respiratory: CTA GI: Soft, NT/ND MS: No edema Neuro:  Grossly intact Psych: Normal affect   Labs    High Sensitivity Troponin:   Recent Labs  Lab 02/05/24 1445 02/05/24 1621 02/06/24 0905 02/06/24 1115  TROPONINIHS 9 9 9 9      Chemistry Recent Labs  Lab 02/05/24 1445 02/06/24 0302 02/06/24 0724 02/07/24 0308   NA 135 135 135 136  K 3.9 3.8 3.8 4.3  CL 102 100 99 102  CO2 24 28 28 26   GLUCOSE 155* 89 89 88  BUN 14 14 14 13   CREATININE 1.47* 1.54* 1.56* 1.53*  CALCIUM  9.0 8.1* 8.3* 8.5*  PROT 7.0  --   --   --   ALBUMIN 3.9  --   --   --   AST 21  --   --   --   ALT 12  --   --   --   ALKPHOS 63  --   --   --   BILITOT 0.8  --   --   --   GFRNONAA 55* 52* 51* 53*  ANIONGAP 9 7 8 8     Hematology Recent Labs  Lab 02/05/24 1445 02/06/24 0302  WBC 5.4 5.6  RBC 3.51* 3.22*  HGB 12.9* 11.7*  HCT 37.2* 34.2*  MCV 106.0* 106.2*  MCH 36.8* 36.3*  MCHC 34.7 34.2  RDW 15.3 15.1  PLT 184 153    BNP Recent Labs  Lab 02/05/24 1445  BNP 176.0*  Radiology    CARDIAC CATHETERIZATION Result Date: 02/06/2024 Images from the original result were not included. Coronary and bypass graft angiography 02/06/2024: LM: Distal 60% stenosis LAD: ostial 60% stenosis, followed by proximal 100% occlusion Ramus: Prox 80% stenosis, distally small caliber vessel (Unprotected) Lcx: Mld diffuse disease (Unprotected) RCA: Prox 100% occlusion (Unprotected) LIMA-LAD: Patent. Moderate disease in mid LAD beyond graft touchdown SVG_diag: Prox stent with minimal restenosis. Mid stent with 40% restenosis, slightly progressed since 2021. Jump graft portion supplying Lcx/OM is known occluded. SVG-RCA: Known occluded, not engaged today LVEDP 19 mmHg Severe native vessel disease Patent LIMA-LAD, SVG-diag portion of jump graft with only moderate restenosis Exertional angina likely due to severe native vessel disease with no good targets for percutaneous intervention. Exertional dizziness unlikely to be related to CAD. Continue medical management, and uptitration of antianginal therapy as tolerated. Cody Das, MD   DG Chest Port 1 View Result Date: 02/05/2024 CLINICAL DATA:  Chest pain and shortness of breath EXAM: PORTABLE CHEST 1 VIEW COMPARISON:  07/19/2023 FINDINGS: Postoperative changes in the mediastinum.  Heart size and pulmonary vascularity are normal. Emphysematous changes in the lungs. No airspace disease or consolidation. No pleural effusion or pneumothorax. Mediastinal contours appear intact. IMPRESSION: Emphysematous changes in the lungs.  No focal consolidation. Electronically Signed   By: Boyce Byes M.D.   On: 02/05/2024 15:22    Patient Profile     58 y.o. male with past medical history of coronary artery disease status post coronary bypass and graft, ischemic cardiomyopathy, diabetes mellitus, bipolar disorder, tobacco abuse admitted with unstable angina and transferred for cardiac catheterization.  Most recent echocardiogram September 2022 showed ejection fraction 55%, mild biatrial enlargement.  Cardiac catheterization performed and medical therapy recommended.  Assessment & Plan    1 unstable angina-cardiac catheterization results noted.  No good targets for intervention.  Plan is medical therapy.  Continue aspirin , Plavix , statin, low-dose carvedilol  and isosorbide .  Will not continue Ranexa .  Will arrange echocardiogram as an outpatient to reassess LV function.  2 chronic stage IIIa kidney disease-creatinine unchanged this morning.  3 hyperlipidemia-continue Crestor .  Check lipids and liver in 8 weeks.  Patient can be discharged today.  Will arrange follow-up with APP in 2 to 4 weeks.  Follow-up with Dr. Londa Rival in 3 months.  For questions or updates, please contact [facility] Please consult [email or web address] for contact info under        Signed, Alexandria Angel, MD  02/07/2024, 8:14 AM

## 2024-02-07 NOTE — Telephone Encounter (Signed)
 Please arrange follow-up with APP in 2 to 4 weeks. Follow-up with Dr. Londa Rival in 3 months. They got DC before I could set this up so please call.

## 2024-02-08 LAB — LIPOPROTEIN A (LPA): Lipoprotein (a): 89.3 nmol/L — ABNORMAL HIGH (ref <75.0)

## 2024-02-08 NOTE — Progress Notes (Deleted)
  Cardiology Office Note:  .   Date:  02/08/2024  ID:  [name] [name], DOB 1966/09/19, MRN [id number] PCP: Veda Gerald, MD  [facility] Providers Cardiologist:  Teddie Favre, MD { Click to update primary MD,subspecialty MD or APP then REFRESH:1}   History of Present Illness: .   [name] [name] is a 58 y.o. male  with past medical history of coronary artery disease status post coronary bypass and graft, ischemic cardiomyopathy, diabetes mellitus, bipolar disorder, tobacco abuse.  Patient was admitted with unstable angina and underwent cardiac catheterization-no good targets for intervention, medical therapy recommended and Ranexa  added.. OP echo recommended.  ROS: ***  Studies Reviewed: Aaron Aas         Prior CV Studies: {Select studies to display:26339}  Cath 02/05/34 Coronary and bypass graft angiography 02/06/2024: LM: Distal 60% stenosis LAD: ostial 60% stenosis, followed by proximal 100% occlusion  Ramus: Prox 80% stenosis, distally small caliber vessel (Unprotected) Lcx: Mld diffuse disease (Unprotected) RCA: Prox 100% occlusion (Unprotected) LIMA-LAD: Patent. Moderate disease in mid LAD beyond graft touchdown SVG_diag: Prox stent with minimal restenosis. Mid stent with 40% restenosis, slightly progressed since 2021. Jump graft portion supplying Lcx/OM is known occluded. SVG-RCA: Known occluded, not engaged today   LVEDP 19 mmHg Severe native vessel disease Patent LIMA-LAD, SVG-diag portion of jump graft with only moderate restenosis   Exertional angina likely due to severe native vessel disease with no good targets for percutaneous intervention. Exertional dizziness unlikely to be related to CAD.   Continue medical management, and uptitration of antianginal therapy as tolerated.    Risk Assessment/Calculations:   {Does this patient have ATRIAL FIBRILLATION?:[phone]}         Physical Exam:   VS:  There were no vitals taken for this visit.   Wt Readings  from Last 3 Encounters:  02/07/24 201 lb 15.1 oz (91.6 kg)  12/03/23 198 lb (89.8 kg)  05/16/23 211 lb (95.7 kg)    GEN: Well nourished, well developed in no acute distress NECK: No JVD; No carotid bruits CARDIAC: ***RRR, no murmurs, rubs, gallops RESPIRATORY:  Clear to auscultation without rales, wheezing or rhonchi  ABDOMEN: Soft, non-tender, non-distended EXTREMITIES:  No edema; No deformity   ASSESSMENT AND PLAN: .    CAD s/p CABG 2003 chronically occluded SVG to RCA, cath 02/06/24 medical therpay, no good targets.  ICM  HTN with hypotension-losartan held in hospital  CKD3a  DM2     {Are you ordering a CV Procedure (e.g. stress test, cath, DCCV, TEE, etc)?   Press F2        :[id number]}  Dispo: ***  Signed, Theotis Flake, PA-C

## 2024-02-11 ENCOUNTER — Ambulatory Visit: Admitting: Physician Assistant

## 2024-02-18 DIAGNOSIS — I25110 Atherosclerotic heart disease of native coronary artery with unstable angina pectoris: Secondary | ICD-10-CM | POA: Diagnosis not present

## 2024-02-18 DIAGNOSIS — Z0000 Encounter for general adult medical examination without abnormal findings: Secondary | ICD-10-CM | POA: Diagnosis not present

## 2024-02-18 DIAGNOSIS — E1122 Type 2 diabetes mellitus with diabetic chronic kidney disease: Secondary | ICD-10-CM | POA: Diagnosis not present

## 2024-02-18 DIAGNOSIS — I2081 Angina pectoris with coronary microvascular dysfunction: Secondary | ICD-10-CM | POA: Diagnosis not present

## 2024-03-04 ENCOUNTER — Telehealth: Payer: Self-pay | Admitting: Cardiology

## 2024-03-04 MED ORDER — ISOSORBIDE MONONITRATE ER 60 MG PO TB24: 60.0000 mg | ORAL_TABLET | Freq: Every day | ORAL | 1 refills | Status: DC

## 2024-03-04 MED ORDER — ROSUVASTATIN CALCIUM 40 MG PO TABS: 40.0000 mg | ORAL_TABLET | Freq: Every day | ORAL | 1 refills | Status: DC

## 2024-03-04 NOTE — Telephone Encounter (Signed)
 Done

## 2024-03-04 NOTE — Telephone Encounter (Signed)
*  STAT* If patient is at the pharmacy, call can be transferred to refill team.   1. Which medications need to be refilled? (please list name of each medication and dose if known) rosuvastatin  (CRESTOR ) 40 MG tablet Take 1 tablet (40 mg total) by mouth daily.   isosorbide  mononitrate (IMDUR ) 60 MG 24 hr tablet Take 1 tablet (60 mg total) by mouth daily.   2. Would you like to learn more about the convenience, safety, & potential cost savings by using the [facility]? No   3. Are you open to using the [facility] No   4. Which pharmacy/location (including street and city if local pharmacy) is medication to be sent to?  [facility] [address], [place] - [address] [facility].     5. Do they need a 30 day or 90 day supply? 90 Day Supply

## 2024-03-11 ENCOUNTER — Telehealth: Payer: Self-pay | Admitting: Cardiology

## 2024-03-11 NOTE — Telephone Encounter (Signed)
 Called patient to see if there where any concerns. He stated that he had a missed call. Advised him didn't see where anyone called other than maybe to see if he wanted an earlier appointment. No other issues at this time.

## 2024-03-11 NOTE — Telephone Encounter (Signed)
Patient states he was returning call. Please advise ?

## 2024-03-11 NOTE — Telephone Encounter (Signed)
 States that h

## 2024-03-13 DIAGNOSIS — M1009 Idiopathic gout, multiple sites: Secondary | ICD-10-CM | POA: Diagnosis not present

## 2024-03-13 DIAGNOSIS — N1831 Chronic kidney disease, stage 3a: Secondary | ICD-10-CM | POA: Diagnosis not present

## 2024-03-13 DIAGNOSIS — I2081 Angina pectoris with coronary microvascular dysfunction: Secondary | ICD-10-CM | POA: Diagnosis not present

## 2024-03-13 DIAGNOSIS — I25110 Atherosclerotic heart disease of native coronary artery with unstable angina pectoris: Secondary | ICD-10-CM | POA: Diagnosis not present

## 2024-03-13 DIAGNOSIS — D649 Anemia, unspecified: Secondary | ICD-10-CM | POA: Diagnosis not present

## 2024-03-13 DIAGNOSIS — J449 Chronic obstructive pulmonary disease, unspecified: Secondary | ICD-10-CM | POA: Diagnosis not present

## 2024-03-13 DIAGNOSIS — Z0000 Encounter for general adult medical examination without abnormal findings: Secondary | ICD-10-CM | POA: Diagnosis not present

## 2024-03-13 DIAGNOSIS — H6120 Impacted cerumen, unspecified ear: Secondary | ICD-10-CM | POA: Diagnosis not present

## 2024-03-13 DIAGNOSIS — E1122 Type 2 diabetes mellitus with diabetic chronic kidney disease: Secondary | ICD-10-CM | POA: Diagnosis not present

## 2024-03-13 DIAGNOSIS — I952 Hypotension due to drugs: Secondary | ICD-10-CM | POA: Diagnosis not present

## 2024-04-20 ENCOUNTER — Other Ambulatory Visit: Payer: Self-pay | Admitting: Cardiology

## 2024-04-24 ENCOUNTER — Encounter: Payer: Self-pay | Admitting: Nurse Practitioner

## 2024-04-24 ENCOUNTER — Ambulatory Visit: Attending: Nurse Practitioner | Admitting: Nurse Practitioner

## 2024-04-24 VITALS — BP 112/60 | HR 62 | Ht 63.0 in | Wt 206.0 lb

## 2024-04-24 DIAGNOSIS — I255 Ischemic cardiomyopathy: Secondary | ICD-10-CM | POA: Diagnosis not present

## 2024-04-24 DIAGNOSIS — I5032 Chronic diastolic (congestive) heart failure: Secondary | ICD-10-CM | POA: Diagnosis not present

## 2024-04-24 DIAGNOSIS — I10 Essential (primary) hypertension: Secondary | ICD-10-CM

## 2024-04-24 DIAGNOSIS — Z720 Tobacco use: Secondary | ICD-10-CM | POA: Diagnosis not present

## 2024-04-24 DIAGNOSIS — I25119 Atherosclerotic heart disease of native coronary artery with unspecified angina pectoris: Secondary | ICD-10-CM | POA: Diagnosis not present

## 2024-04-24 DIAGNOSIS — E669 Obesity, unspecified: Secondary | ICD-10-CM

## 2024-04-24 DIAGNOSIS — E782 Mixed hyperlipidemia: Secondary | ICD-10-CM | POA: Diagnosis not present

## 2024-04-24 NOTE — Progress Notes (Unsigned)
 Cardiology Office Note:    Date:  04/24/2024 ID:  [name] [name], DOB 1966-02-03, MRN [id number] PCP:  Orpha Yancey LABOR, MD [facility] Providers Cardiologist:  Jayson Sierras, MD    Referring MD: Orpha Yancey LABOR, MD   CC: Here for hospital follow-up  History of Present Illness:    [name] [name] is a 58 y.o. male with a PMH of CAD, s/p CABG in 2003, HFimpEF/ICM, hyperlipidemia, type 2 diabetes, hypertension, asthma, GERD, bipolar disorder, and tobacco abuse, who presents today for hospital follow-up appointment.  History of remote CABG in 2003, s/p NSTEMI in 2010 and received bare-metal stent to SVG-RCA.  PTCA/stent/asp thromb to SVG-RCA in 2011, several months later in 2011 received PTCA to SVG-RCA, known occlusion of SVG-RCA in 2013.  Had NSTEMI in 2014, status post PTCA/DES to SVG-diagonal/intermediate/OM1.  Last seen by Dr. Sierras on April 30, 2023.  Denied any worsening dyspnea on exertion, did notice symptoms noted with current heat and humidity regarding the weather at the time. Overall was doing well.   12/03/2023 - Presents today for 108-month follow-up appointment.  Doing well.  Tells me Dr. Orpha recently started him on losartan and wants to know if he should be starting this.  Also believes that he is taking lisinopril  although I do not see this on his med list today. Overall doing well from a cardiac perspective. Denies any chest pain, worsening shortness of breath, palpitations, syncope, presyncope, dizziness, orthopnea, PND, swelling or significant weight changes, acute bleeding, or claudication.  Hospital stay end of April to early May due to chest pain. Was reporting chest pain on exertion and at rest. Underwent LHC that showed no target intervention, medical management recommended. Losartan held d/t lower BP.  Today he presents for hospital follow-up. He states he has had two episodes of chest pain 7/6 and 7/7 of stinging/chest pressure that woke him up in the  middle of the night, took nitroglycerin  tablets with these eipsides that slowly were relived in 2-3 hours. No recurrences since. Compliant with his medications. Says at one time he took Ranexa  1,000 mg BID and did help but caused vertigo/ringing in ears, and so he is no longer taking this medication. Tells me he cannot tolerate Crestor . Just had labs with his PCP in early June. Weaning himself off smoking, was smoking 2 PPD and now down to 1 PPD. Denies any shortness of breath, palpitations, syncope, presyncope, dizziness, orthopnea, PND, swelling or significant weight changes, acute bleeding, or claudication.  Please see the history of present illness.    All other systems reviewed and are negative.  EKGs/Labs/Other Studies Reviewed:    The following studies were reviewed today:   EKG: EKG is not ordered today.  LHC 01/2024:  Coronary and bypass graft angiography 02/06/2024: LM: Distal 60% stenosis LAD: ostial 60% stenosis, followed by proximal 100% occlusion  Ramus: Prox 80% stenosis, distally small caliber vessel (Unprotected) Lcx: Mld diffuse disease (Unprotected) RCA: Prox 100% occlusion (Unprotected) LIMA-LAD: Patent. Moderate disease in mid LAD beyond graft touchdown SVG_diag: Prox stent with minimal restenosis. Mid stent with 40% restenosis, slightly progressed since 2021. Jump graft portion supplying Lcx/OM is known occluded. SVG-RCA: Known occluded, not engaged today   LVEDP 19 mmHg      Severe native vessel disease Patent LIMA-LAD, SVG-diag portion of jump graft with only moderate restenosis   Exertional angina likely due to severe native vessel disease with no good targets for percutaneous intervention. Exertional dizziness unlikely to be related to CAD.  Continue medical management, and uptitration of antianginal therapy as tolerated.   [name] JINNY Lawrence, MD  Echo limited on 07/02/2021: 1. Overall EF preserved inferior basal hypokinesis . Left ventricular  ejection  fraction, by estimation, is 55%. The left ventricle has normal  function. The left ventricle demonstrates regional wall motion  abnormalities (see scoring diagram/findings for  description). Left ventricular diastolic parameters were normal.   2. Right ventricular systolic function is normal. The right ventricular  size is normal. There is normal pulmonary artery systolic pressure.   3. Left atrial size was mildly dilated.   4. Right atrial size was mildly dilated.   5. The mitral valve is normal in structure. No evidence of mitral valve  regurgitation. No evidence of mitral stenosis.   6. Sclerosis particulary the non coronary cusp no stenosis . The aortic  valve is tricuspid. There is moderate calcification of the aortic valve.  Aortic valve regurgitation is not visualized. No aortic stenosis is  present.   7. The inferior vena cava is normal in size with greater than 50%  respiratory variability, suggesting right atrial pressure of 3 mmHg.  Left heart cath on 08/19/2020: Conclusions: Severe native coronary artery disease, including 60% LMCA disease, chronic total occlusions of ostial LAD and mid RCA, and 80% proximal ramus intermedius stenosis. Widely patent LIMA-LAD. Patent SVG-D1 with mild to moderate in-stent restenosis in the proximal and distal graft stents. Chronically occluded SVG-RCA. Low normal left ventricular systolic function with mid anterior hypokinesis. Mildly to moderately elevated left ventricular filling pressure.   Recommendations: Continue aggressive medical therapy.  Appearance of native coronary arteries and bypasses is not significantly different compared with prior catheterization in 11/2015. Escalate diuresis, as worsening HFpEF may be contributing to the patient's symptoms. Aggressive secondary prevention of coronary artery disease.   Echocardiogram on 08/19/2020:  1. Left ventricular ejection fraction, by estimation, is 55 to 60%. The  left ventricle has  normal function. The left ventricle demonstrates  regional wall motion abnormalities (see scoring diagram/findings for  description). There is mild left ventricular   hypertrophy. Left ventricular diastolic parameters were normal.   2. Right ventricular systolic function is normal. The right ventricular  size is normal. There is normal pulmonary artery systolic pressure. The  estimated right ventricular systolic pressure is 30.7 mmHg.   3. The mitral valve is grossly normal. Trivial mitral valve  regurgitation.   4. The aortic valve is tricuspid. Aortic valve regurgitation is not  visualized. Mild aortic valve sclerosis is present, with no evidence of  aortic valve stenosis.   5. The inferior vena cava is dilated in size with >50% respiratory  variability, suggesting right atrial pressure of 8 mmHg.   Left heart cath on 11/22/2015: Ramus lesion, 95% stenosed.   Saphenous vein bypass graft disease with total occlusion of the SVG to the distal RCA. The sequential SVG to the diagonal and ramus intermedius is disease with occlusion of the limb to the ramus intermedius. The side-to-side anastomosis on the diagonal remains patent. The stents in the ostium and distal saphenous vein graft sites are widely patent. Patent LIMA to LAD Total occlusion of the native right. The distal right coronary/PDA fills by left-to-right collaterals from the LAD. Segmental diffuse disease in the left main. Total occlusion of the LAD. High-grade obstruction in the circumflex and ramus intermedius originating at the left main. Widely patent LIMA to the LAD When compared to the prior angiographic images, no significant change has occurred. Inferior wall hypokinesis, estimated ejection  fraction 45%   Recommendations:   Continue medical management. No significant change in anatomy when compared to 01/28/2015.   Myoview on 06/17/2014: IMPRESSION:  1. Inferolateral defect as outlined consistent with scarring and  mild  to moderate peri-infarct ischemia.   2. Global LV hypokinesis, most prominent in the inferior wall.   3. Left ventricular ejection fraction 40%   4. Intermediate-risk stress test findings*.   Physical Exam:    VS:  BP 112/60   Pulse 62   Ht 5' 3 (1.6 m)   Wt 206 lb (93.4 kg)   SpO2 96%   BMI 36.49 kg/m     Wt Readings from Last 3 Encounters:  04/24/24 206 lb (93.4 kg)  02/07/24 201 lb 15.1 oz (91.6 kg)  12/03/23 198 lb (89.8 kg)     GEN: Obese, 58 y.o. male in no acute distress HEENT: Normal NECK: No JVD; No carotid bruits CARDIAC: S1/S2, RRR, no murmurs, rubs, gallops; 2+ pulses RESPIRATORY:  Clear to auscultation without rales, wheezing or rhonchi, strong/nonproductive smoker's cough MUSCULOSKELETAL:  No edema; No deformity  SKIN: Warm and dry NEUROLOGIC:  Alert and oriented x 3 PSYCHIATRIC:  Normal affect   ASSESSMENT & PLAN:    In order of problems listed above:  CAD, s/p CABG Cardiac catheterization in 2021 revealed severe CAD, CTO of ostial LAD and mRCA, 60% LMCA disease, 80% proximal ramus intermedius stenosis, widely patent LIMA-LAD and patent SVG-D1 with mild to moderate in-stent restenosis in proximal and distal graft stents, chronically occluded SVG-RCA, mildly to moderately elevated LVEDP, low normal LV SF with mild anterior hypokinesis.  Aggressive medical therapy recommended with secondary prevention of CAD and escalation in diuresis recommended. Does admit to two anginal episodes above - see HPI. See most recent heart cath noted above from 01/2024. There were no good targets for intervention. Did discuss starting Ranexa , BP trends do not allow uptitration of Imdur . He tells me he wants to wait and think about Ranexa  and will let us  know his decision. Continue aspirin , carvedilol , isosorbide  mononitrate, pravastatin , and nitroglycerin  as needed.  Could not tolerate Crestor  and now only taking Pravastatin . Will arrange CBC, FLP, and CMET.  ED precautions discussed.  Heart healthy diet and regular cardiovascular exercise encouraged.    HFimpEF, Ischemic CM Stage C, NYHA class I-II symptoms.  EF 55% in September 2022.  Echocardiogram showed normal EF, regional wall motion abnormalities noted left ventricle with normal left ventricular diastolic parameters. Euvolemic and well compensated on exam.  Continue current medication regimen. Low sodium diet, fluid restriction <2L, and daily weights encouraged. Educated to contact our office for weight gain of 2 lbs overnight or 5 lbs in one week. Heart healthy diet and regular cardiovascular exercise encouraged.   Mixed HLD Lipoprotein A 02/2024 was elevated. Could not tolerate Crestor , only on pravastatin .  Continue current medication regimen. Heart healthy diet and regular cardiovascular exercise encouraged. Will obtain FLP and CMET.   HTN Blood pressure stable today.  BP well-controlled at home. Discussed to monitor BP at home at least 2 hours after medications and sitting for 5-10 minutes.  Continue current medication regimen. Heart healthy diet and regular cardiovascular exercise encouraged.   5.  Obesity Weight loss via diet and exercise encouraged. Discussed the impact being overweight would have on cardiovascular risk.   Tobacco abuse  Smoking cessation discussed and encouraged today.  6.  Disposition: Will request most recent labs. Care and ED precautions discussed. Follow-up with Dr. Debera or APP in 2-3 months  or sooner if anything changes.  Medication Adjustments/Labs and Tests Ordered: Current medicines are reviewed at length with the patient today.  Concerns regarding medicines are outlined above.  No orders of the defined types were placed in this encounter.  No orders of the defined types were placed in this encounter.   Patient Instructions  Medication Instructions:  Your physician recommends that you continue on your current medications as directed. Please refer to the Current Medication list  given to you today.  Labwork: None   Testing/Procedures: None   Follow-Up: Your physician recommends that you schedule a follow-up appointment in: 2-3 Months   Any Other Special Instructions Will Be Listed Below (If Applicable).  If you need a refill on your cardiac medications before your next appointment, please call your pharmacy.   Signed, Almarie Crate, NP

## 2024-04-24 NOTE — Patient Instructions (Signed)

## 2024-04-25 ENCOUNTER — Encounter: Payer: Self-pay | Admitting: Internal Medicine

## 2024-05-28 ENCOUNTER — Other Ambulatory Visit: Payer: Self-pay | Admitting: Cardiology

## 2024-06-01 ENCOUNTER — Ambulatory Visit: Payer: Self-pay | Admitting: Nurse Practitioner

## 2024-06-19 DIAGNOSIS — D649 Anemia, unspecified: Secondary | ICD-10-CM | POA: Diagnosis not present

## 2024-06-19 DIAGNOSIS — J449 Chronic obstructive pulmonary disease, unspecified: Secondary | ICD-10-CM | POA: Diagnosis not present

## 2024-06-19 DIAGNOSIS — E1122 Type 2 diabetes mellitus with diabetic chronic kidney disease: Secondary | ICD-10-CM | POA: Diagnosis not present

## 2024-06-19 DIAGNOSIS — I25110 Atherosclerotic heart disease of native coronary artery with unstable angina pectoris: Secondary | ICD-10-CM | POA: Diagnosis not present

## 2024-06-19 DIAGNOSIS — M1009 Idiopathic gout, multiple sites: Secondary | ICD-10-CM | POA: Diagnosis not present

## 2024-06-19 DIAGNOSIS — N1831 Chronic kidney disease, stage 3a: Secondary | ICD-10-CM | POA: Diagnosis not present

## 2024-06-19 DIAGNOSIS — I952 Hypotension due to drugs: Secondary | ICD-10-CM | POA: Diagnosis not present

## 2024-06-24 ENCOUNTER — Telehealth: Payer: Self-pay | Admitting: Cardiology

## 2024-06-24 MED ORDER — ISOSORBIDE MONONITRATE ER 60 MG PO TB24: 60.0000 mg | ORAL_TABLET | Freq: Every day | ORAL | 3 refills | Status: DC

## 2024-06-24 NOTE — Telephone Encounter (Signed)
*  STAT* If patient is at the pharmacy, call can be transferred to refill team.   1. Which medications need to be refilled? (please list name of each medication and dose if known) isosorbide  mononitrate (IMDUR ) 60 MG 24 hr tablet [[id number]]    2. Would you like to learn more about the convenience, safety, & potential cost savings by using the [facility]? Na      3. Are you open to using the [facility] (Type [facility].na    4. Which pharmacy/location (including street and city if local pharmacy) is medication to be sent to? [facility] in [place]   5. Do they need a 30 day or 90 day supply? 90

## 2024-06-24 NOTE — Telephone Encounter (Signed)
 Pt's medication was sent to pt's pharmacy as requested. Confirmation received.

## 2024-07-18 ENCOUNTER — Other Ambulatory Visit: Payer: Self-pay | Admitting: Cardiology

## 2024-07-25 ENCOUNTER — Encounter: Payer: Self-pay | Admitting: Nurse Practitioner

## 2024-07-25 ENCOUNTER — Ambulatory Visit: Attending: Nurse Practitioner | Admitting: Nurse Practitioner

## 2024-07-25 VITALS — BP 130/72 | HR 64 | Wt 209.2 lb

## 2024-07-25 DIAGNOSIS — M79605 Pain in left leg: Secondary | ICD-10-CM | POA: Diagnosis not present

## 2024-07-25 DIAGNOSIS — R079 Chest pain, unspecified: Secondary | ICD-10-CM | POA: Diagnosis not present

## 2024-07-25 DIAGNOSIS — I5020 Unspecified systolic (congestive) heart failure: Secondary | ICD-10-CM | POA: Diagnosis not present

## 2024-07-25 DIAGNOSIS — E782 Mixed hyperlipidemia: Secondary | ICD-10-CM | POA: Diagnosis not present

## 2024-07-25 DIAGNOSIS — Z720 Tobacco use: Secondary | ICD-10-CM | POA: Diagnosis not present

## 2024-07-25 DIAGNOSIS — M79604 Pain in right leg: Secondary | ICD-10-CM | POA: Diagnosis not present

## 2024-07-25 DIAGNOSIS — I25119 Atherosclerotic heart disease of native coronary artery with unspecified angina pectoris: Secondary | ICD-10-CM | POA: Diagnosis not present

## 2024-07-25 DIAGNOSIS — I10 Essential (primary) hypertension: Secondary | ICD-10-CM

## 2024-07-25 DIAGNOSIS — N1832 Chronic kidney disease, stage 3b: Secondary | ICD-10-CM | POA: Diagnosis not present

## 2024-07-25 DIAGNOSIS — I255 Ischemic cardiomyopathy: Secondary | ICD-10-CM | POA: Diagnosis not present

## 2024-07-25 DIAGNOSIS — E669 Obesity, unspecified: Secondary | ICD-10-CM

## 2024-07-25 MED ORDER — ISOSORBIDE MONONITRATE ER 30 MG PO TB24: ORAL_TABLET | ORAL | 1 refills | Status: AC

## 2024-07-25 NOTE — Patient Instructions (Signed)
 Medication Instructions:  Your physician has recommended you make the following change in your medication:  Please change IMDUR  to 60 Mg in the morning and 30 Mg in the evening   Labwork: None   Testing/Procedures: Your physician has requested that you have an ankle brachial index (ABI). During this test an ultrasound and blood pressure cuff are used to evaluate the arteries that supply the arms and legs with blood. Allow thirty minutes for this exam. There are no restrictions or special instructions.  Please note: We ask at that you not bring children with you during ultrasound (echo/ vascular) testing. Due to room size and safety concerns, children are not allowed in the ultrasound rooms during exams. Our front office staff cannot provide observation of children in our lobby area while testing is being conducted. An adult accompanying a patient to their appointment will only be allowed in the ultrasound room at the discretion of the ultrasound technician under special circumstances. We apologize for any inconvenience.  Follow-Up: Your physician recommends that you schedule a follow-up appointment in: 6-8 weeks   Any Other Special Instructions Will Be Listed Below (If Applicable).  If you need a refill on your cardiac medications before your next appointment, please call your pharmacy.

## 2024-07-25 NOTE — Progress Notes (Unsigned)
 Cardiology Office Note:    Date: 07/25/2024 ID:  [name] [name], DOB 26-May-1966, MRN [id number] PCP:  [name] Yancey LABOR, MD [facility] Providers Cardiologist:  [name] Sierras, MD    Referring MD: [name] Yancey LABOR, MD   CC: Here for 2-3 month follow-up  History of Present Illness:    [name] [name] is a 58 y.o. male with a PMH of CAD, s/p CABG in 2003, HFimpEF/ICM, hyperlipidemia, type 2 diabetes, hypertension, asthma, GERD, bipolar disorder, and tobacco abuse, who presents today for 2-3 month follow-up appointment.  History of remote CABG in 2003, s/p NSTEMI in 2010 and received bare-metal stent to SVG-RCA.  PTCA/stent/asp thromb to SVG-RCA in 2011, several months later in 2011 received PTCA to SVG-RCA, known occlusion of SVG-RCA in 2013.  Had NSTEMI in 2014, status post PTCA/DES to SVG-diagonal/intermediate/OM1.  Last seen by Dr. Sierras on April 30, 2023.  Denied any worsening dyspnea on exertion, did notice symptoms noted with current heat and humidity regarding the weather at the time. Overall was doing well.   12/03/2023 - Presents today for 96-month follow-up appointment.  Doing well.  Tells me Dr. Orpha recently started him on losartan and wants to know if he should be starting this.  Also believes that he is taking lisinopril  although I do not see this on his med list today. Overall doing well from a cardiac perspective. Denies any chest pain, worsening shortness of breath, palpitations, syncope, presyncope, dizziness, orthopnea, PND, swelling or significant weight changes, acute bleeding, or claudication.  Hospital stay end of April to early May due to chest pain. Was reporting chest pain on exertion and at rest. Underwent LHC that showed no target intervention, medical management recommended. Losartan held d/t lower BP.  04/24/2024 - Today he presents for hospital follow-up. He states he has had two episodes of chest pain 7/6 and 7/7 of stinging/chest pressure that woke  him up in the middle of the night, took nitroglycerin  tablets with these eipsides that slowly were relived in 2-3 hours. No recurrences since. Compliant with his medications. Says at one time he took Ranexa  1,000 mg BID and did help but caused vertigo/ringing in ears, and so he is no longer taking this medication. Tells me he cannot tolerate Crestor . Just had labs with his PCP in early June. Weaning himself off smoking, was smoking 2 PPD and now down to 1 PPD. Denies any shortness of breath, palpitations, syncope, presyncope, dizziness, orthopnea, PND, swelling or significant weight changes, acute bleeding, or claudication.  07/25/2024 - Here for follow-up. Admits to occasional slight chest discomfort when doing heavy exertional activities and prolonged walking. Does admit to some leg pain and wearing out, with prolonged walking over the past year.  Does tell me he has thought about Ranexa  and does not want to restart this medicine as this made him dizzy drunk. Denies any shortness of breath, palpitations, syncope, presyncope, dizziness, orthopnea, PND, swelling or significant weight changes, acute bleeding, or claudication.  He is no longer smoking marijuana and weaning off his nicotine  use.  Please see the history of present illness.    All other systems reviewed and are negative.  EKGs/Labs/Other Studies Reviewed:    The following studies were reviewed today:   EKG: EKG is not ordered today.  LHC 01/2024:  Coronary and bypass graft angiography 02/06/2024: LM: Distal 60% stenosis LAD: ostial 60% stenosis, followed by proximal 100% occlusion  Ramus: Prox 80% stenosis, distally small caliber vessel (Unprotected) Lcx: Mld diffuse disease (Unprotected)  RCA: Prox 100% occlusion (Unprotected) LIMA-LAD: Patent. Moderate disease in mid LAD beyond graft touchdown SVG_diag: Prox stent with minimal restenosis. Mid stent with 40% restenosis, slightly progressed since 2021. Jump graft portion supplying  Lcx/OM is known occluded. SVG-RCA: Known occluded, not engaged today   LVEDP 19 mmHg      Severe native vessel disease Patent LIMA-LAD, SVG-diag portion of jump graft with only moderate restenosis   Exertional angina likely due to severe native vessel disease with no good targets for percutaneous intervention. Exertional dizziness unlikely to be related to CAD.   Continue medical management, and uptitration of antianginal therapy as tolerated.   [name], MD  Echo limited on 07/02/2021: 1. Overall EF preserved inferior basal hypokinesis . Left ventricular  ejection fraction, by estimation, is 55%. The left ventricle has normal  function. The left ventricle demonstrates regional wall motion  abnormalities (see scoring diagram/findings for  description). Left ventricular diastolic parameters were normal.   2. Right ventricular systolic function is normal. The right ventricular  size is normal. There is normal pulmonary artery systolic pressure.   3. Left atrial size was mildly dilated.   4. Right atrial size was mildly dilated.   5. The mitral valve is normal in structure. No evidence of mitral valve  regurgitation. No evidence of mitral stenosis.   6. Sclerosis particulary the non coronary cusp no stenosis . The aortic  valve is tricuspid. There is moderate calcification of the aortic valve.  Aortic valve regurgitation is not visualized. No aortic stenosis is  present.   7. The inferior vena cava is normal in size with greater than 50%  respiratory variability, suggesting right atrial pressure of 3 mmHg.  Left heart cath on 08/19/2020: Conclusions: Severe native coronary artery disease, including 60% LMCA disease, chronic total occlusions of ostial LAD and mid RCA, and 80% proximal ramus intermedius stenosis. Widely patent LIMA-LAD. Patent SVG-D1 with mild to moderate in-stent restenosis in the proximal and distal graft stents. Chronically occluded SVG-RCA. Low  normal left ventricular systolic function with mid anterior hypokinesis. Mildly to moderately elevated left ventricular filling pressure.   Recommendations: Continue aggressive medical therapy.  Appearance of native coronary arteries and bypasses is not significantly different compared with prior catheterization in 11/2015. Escalate diuresis, as worsening HFpEF may be contributing to the patient's symptoms. Aggressive secondary prevention of coronary artery disease.   Echocardiogram on 08/19/2020:  1. Left ventricular ejection fraction, by estimation, is 55 to 60%. The  left ventricle has normal function. The left ventricle demonstrates  regional wall motion abnormalities (see scoring diagram/findings for  description). There is mild left ventricular   hypertrophy. Left ventricular diastolic parameters were normal.   2. Right ventricular systolic function is normal. The right ventricular  size is normal. There is normal pulmonary artery systolic pressure. The  estimated right ventricular systolic pressure is 30.7 mmHg.   3. The mitral valve is grossly normal. Trivial mitral valve  regurgitation.   4. The aortic valve is tricuspid. Aortic valve regurgitation is not  visualized. Mild aortic valve sclerosis is present, with no evidence of  aortic valve stenosis.   5. The inferior vena cava is dilated in size with >50% respiratory  variability, suggesting right atrial pressure of 8 mmHg.   Left heart cath on 11/22/2015: Ramus lesion, 95% stenosed.   Saphenous vein bypass graft disease with total occlusion of the SVG to the distal RCA. The sequential SVG to the diagonal and ramus intermedius is disease with occlusion of  the limb to the ramus intermedius. The side-to-side anastomosis on the diagonal remains patent. The stents in the ostium and distal saphenous vein graft sites are widely patent. Patent LIMA to LAD Total occlusion of the native right. The distal right coronary/PDA fills by  left-to-right collaterals from the LAD. Segmental diffuse disease in the left main. Total occlusion of the LAD. High-grade obstruction in the circumflex and ramus intermedius originating at the left main. Widely patent LIMA to the LAD When compared to the prior angiographic images, no significant change has occurred. Inferior wall hypokinesis, estimated ejection fraction 45%   Recommendations:   Continue medical management. No significant change in anatomy when compared to 01/28/2015.   Myoview on 06/17/2014: IMPRESSION:  1. Inferolateral defect as outlined consistent with scarring and  mild to moderate peri-infarct ischemia.   2. Global LV hypokinesis, most prominent in the inferior wall.   3. Left ventricular ejection fraction 40%   4. Intermediate-risk stress test findings*.   Physical Exam:    VS:  BP 130/72 (BP Location: Right Arm)   Pulse 64   Wt 209 lb 3.2 oz (94.9 kg)   SpO2 96%   BMI 37.06 kg/m     Wt Readings from Last 3 Encounters:  07/25/24 209 lb 3.2 oz (94.9 kg)  04/24/24 206 lb (93.4 kg)  02/07/24 201 lb 15.1 oz (91.6 kg)     GEN: Obese, 58 y.o. male in no acute distress HEENT: Normal NECK: No JVD; No carotid bruits CARDIAC: S1/S2, RRR, no murmurs, rubs, gallops; 2+ pulses RESPIRATORY:  Clear to auscultation without rales, wheezing or rhonchi MUSCULOSKELETAL:  No edema; No deformity  SKIN: Warm and dry NEUROLOGIC:  Alert and oriented x 3 PSYCHIATRIC:  Normal affect   ASSESSMENT & PLAN:    In order of problems listed above:  CAD, s/p CABG, exertional chest pain Cardiac catheterization in 2021 revealed severe CAD, CTO of ostial LAD and mRCA, 60% LMCA disease, 80% proximal ramus intermedius stenosis, widely patent LIMA-LAD and patent SVG-D1 with mild to moderate in-stent restenosis in proximal and distal graft stents, chronically occluded SVG-RCA, mildly to moderately elevated LVEDP, low normal LV SF with mild anterior hypokinesis.  Aggressive medical  therapy recommended with secondary prevention of CAD and escalation in diuresis recommended. Does admit to some anginal episodes above - see HPI. See most recent heart cath noted above from 01/2024. There were no good targets for intervention. Did discuss starting Ranexa , pt declines. Will increase Imdur  to 60 mg in AM, 30 mg in PM based on his current symptoms - see HPI.  Continue aspirin , carvedilol , Plavix , pravastatin , and nitroglycerin  as needed.  ED precautions discussed. Heart healthy diet and regular cardiovascular exercise encouraged.    HFimpEF, Ischemic CM Stage C, NYHA class I-II symptoms.  EF 55% in September 2022.  Echocardiogram showed normal EF, regional wall motion abnormalities noted left ventricle with normal left ventricular diastolic parameters. Euvolemic and well compensated on exam.  Continue current medication regimen. Low sodium diet, fluid restriction <2L, and daily weights encouraged. Educated to contact our office for weight gain of 2 lbs overnight or 5 lbs in one week. Heart healthy diet and regular cardiovascular exercise encouraged.   Mixed HLD Most recent LDL on file at goal. Continue current medication regimen. Heart healthy diet and regular cardiovascular exercise encouraged.   HTN Blood pressure borderline elevated today.  BP well-controlled at home. Discussed to monitor BP at home at least 2 hours after medications and sitting for 5-10 minutes.  Continue current medication regimen. Heart healthy diet and regular cardiovascular exercise encouraged.   5.  Obesity Weight loss via diet and exercise encouraged. Discussed the impact being overweight would have on cardiovascular risk.   6. Tobacco abuse  Smoking cessation discussed and encouraged today.  7. CKD stage 3b Most recent kidney functions table. Avoid nephrotoxic agents. No medication changes at this time. Continue to follow with PCP.  8. Leg pain Noticed with prolonged walking. Will arrange ABI's for  further evaluation.   Disposition: Care and ED precautions discussed. Follow-up with Dr. Debera or APP in 6-8 weeks or sooner if anything changes.  Medication Adjustments/Labs and Tests Ordered: Current medicines are reviewed at length with the patient today.  Concerns regarding medicines are outlined above.  Orders Placed This Encounter  Procedures   VAS US  ABI WITH/WO TBI   Meds ordered this encounter  Medications   isosorbide  mononitrate (IMDUR ) 30 MG 24 hr tablet    Sig: Take 2 tablets (60 mg total) by mouth in the morning AND 1 tablet (30 mg total) every evening.    Dispense:  270 tablet    Refill:  1    Dose change 07/25/24    Patient Instructions  Medication Instructions:  Your physician has recommended you make the following change in your medication:  Please change IMDUR  to 60 Mg in the morning and 30 Mg in the evening   Labwork: None   Testing/Procedures: Your physician has requested that you have an ankle brachial index (ABI). During this test an ultrasound and blood pressure cuff are used to evaluate the arteries that supply the arms and legs with blood. Allow thirty minutes for this exam. There are no restrictions or special instructions.  Please note: We ask at that you not bring children with you during ultrasound (echo/ vascular) testing. Due to room size and safety concerns, children are not allowed in the ultrasound rooms during exams. Our front office staff cannot provide observation of children in our lobby area while testing is being conducted. An adult accompanying a patient to their appointment will only be allowed in the ultrasound room at the discretion of the ultrasound technician under special circumstances. We apologize for any inconvenience.  Follow-Up: Your physician recommends that you schedule a follow-up appointment in: 6-8 weeks   Any Other Special Instructions Will Be Listed Below (If Applicable).  If you need a refill on your cardiac  medications before your next appointment, please call your pharmacy.   Signed, Almarie Crate, NP

## 2024-08-07 ENCOUNTER — Ambulatory Visit: Attending: Nurse Practitioner

## 2024-08-07 DIAGNOSIS — M79604 Pain in right leg: Secondary | ICD-10-CM | POA: Diagnosis not present

## 2024-08-07 DIAGNOSIS — M79605 Pain in left leg: Secondary | ICD-10-CM | POA: Diagnosis not present

## 2024-08-08 LAB — VAS US ABI WITH/WO TBI
Left ABI: 0.71
Right ABI: 1.14

## 2024-08-11 ENCOUNTER — Ambulatory Visit: Payer: Self-pay | Admitting: Nurse Practitioner

## 2024-08-11 ENCOUNTER — Encounter: Payer: Self-pay | Admitting: Internal Medicine

## 2024-08-11 DIAGNOSIS — M79604 Pain in right leg: Secondary | ICD-10-CM

## 2024-08-26 ENCOUNTER — Ambulatory Visit: Attending: Nurse Practitioner

## 2024-08-26 DIAGNOSIS — M79604 Pain in right leg: Secondary | ICD-10-CM

## 2024-08-26 DIAGNOSIS — M79605 Pain in left leg: Secondary | ICD-10-CM | POA: Diagnosis not present

## 2024-08-28 ENCOUNTER — Ambulatory Visit: Payer: Self-pay | Admitting: Nurse Practitioner

## 2024-08-29 ENCOUNTER — Other Ambulatory Visit: Payer: Self-pay | Admitting: *Deleted

## 2024-08-29 ENCOUNTER — Encounter: Payer: Self-pay | Admitting: *Deleted

## 2024-08-29 DIAGNOSIS — I739 Peripheral vascular disease, unspecified: Secondary | ICD-10-CM

## 2024-09-04 ENCOUNTER — Encounter (HOSPITAL_COMMUNITY): Payer: Self-pay

## 2024-09-04 NOTE — ED Triage Notes (Signed)
 Patient states he has a HX coronary heart disease, 5by passes, over 50% blockage in both legs. States he took a total of 5 nitroglycerin  tabs before coming in tonight.

## 2024-09-05 ENCOUNTER — Inpatient Hospital Stay (HOSPITAL_COMMUNITY)

## 2024-09-05 ENCOUNTER — Encounter (HOSPITAL_COMMUNITY): Payer: Self-pay | Admitting: Internal Medicine

## 2024-09-05 ENCOUNTER — Observation Stay (HOSPITAL_COMMUNITY)
Admit: 2024-09-05 | Discharge: 2024-09-05 | Disposition: A | Discharge status: Home / Self Care | Attending: Internal Medicine | Admitting: Internal Medicine

## 2024-09-05 ENCOUNTER — Other Ambulatory Visit (HOSPITAL_COMMUNITY): Payer: Self-pay

## 2024-09-05 DIAGNOSIS — Z7982 Long term (current) use of aspirin: Secondary | ICD-10-CM | POA: Insufficient documentation

## 2024-09-05 DIAGNOSIS — N179 Acute kidney failure, unspecified: Secondary | ICD-10-CM | POA: Insufficient documentation

## 2024-09-05 DIAGNOSIS — I10 Essential (primary) hypertension: Secondary | ICD-10-CM

## 2024-09-05 DIAGNOSIS — I2510 Atherosclerotic heart disease of native coronary artery without angina pectoris: Secondary | ICD-10-CM | POA: Diagnosis not present

## 2024-09-05 DIAGNOSIS — F419 Anxiety disorder, unspecified: Secondary | ICD-10-CM | POA: Diagnosis not present

## 2024-09-05 DIAGNOSIS — K219 Gastro-esophageal reflux disease without esophagitis: Secondary | ICD-10-CM | POA: Insufficient documentation

## 2024-09-05 DIAGNOSIS — E119 Type 2 diabetes mellitus without complications: Secondary | ICD-10-CM

## 2024-09-05 DIAGNOSIS — E1169 Type 2 diabetes mellitus with other specified complication: Secondary | ICD-10-CM

## 2024-09-05 DIAGNOSIS — Z720 Tobacco use: Secondary | ICD-10-CM

## 2024-09-05 DIAGNOSIS — Z794 Long term (current) use of insulin: Secondary | ICD-10-CM | POA: Diagnosis not present

## 2024-09-05 DIAGNOSIS — E785 Hyperlipidemia, unspecified: Secondary | ICD-10-CM | POA: Insufficient documentation

## 2024-09-05 DIAGNOSIS — N1831 Chronic kidney disease, stage 3a: Secondary | ICD-10-CM | POA: Insufficient documentation

## 2024-09-05 DIAGNOSIS — I25118 Atherosclerotic heart disease of native coronary artery with other forms of angina pectoris: Secondary | ICD-10-CM

## 2024-09-05 DIAGNOSIS — E66812 Obesity, class 2: Secondary | ICD-10-CM | POA: Diagnosis not present

## 2024-09-05 DIAGNOSIS — Z6835 Body mass index (BMI) 35.0-35.9, adult: Secondary | ICD-10-CM | POA: Insufficient documentation

## 2024-09-05 DIAGNOSIS — I129 Hypertensive chronic kidney disease with stage 1 through stage 4 chronic kidney disease, or unspecified chronic kidney disease: Secondary | ICD-10-CM | POA: Diagnosis not present

## 2024-09-05 DIAGNOSIS — I2583 Coronary atherosclerosis due to lipid rich plaque: Secondary | ICD-10-CM

## 2024-09-05 DIAGNOSIS — E1122 Type 2 diabetes mellitus with diabetic chronic kidney disease: Secondary | ICD-10-CM | POA: Diagnosis not present

## 2024-09-05 DIAGNOSIS — Z79899 Other long term (current) drug therapy: Secondary | ICD-10-CM | POA: Diagnosis not present

## 2024-09-05 DIAGNOSIS — R079 Chest pain, unspecified: Secondary | ICD-10-CM | POA: Diagnosis present

## 2024-09-05 DIAGNOSIS — I2089 Other forms of angina pectoris: Secondary | ICD-10-CM | POA: Diagnosis present

## 2024-09-05 DIAGNOSIS — Z8679 Personal history of other diseases of the circulatory system: Secondary | ICD-10-CM

## 2024-09-05 LAB — CBC WITH DIFFERENTIAL/PLATELET
Abs Immature Granulocytes: 0.02 K/uL (ref 0.00–0.07)
Basophils Absolute: 0 K/uL (ref 0.0–0.1)
Basophils Relative: 1 %
Eosinophils Absolute: 0.1 K/uL (ref 0.0–0.5)
Eosinophils Relative: 1 %
HCT: 37 % — ABNORMAL LOW (ref 39.0–52.0)
Hemoglobin: 12.2 g/dL — ABNORMAL LOW (ref 13.0–17.0)
Immature Granulocytes: 0 %
Lymphocytes Relative: 37 %
Lymphs Abs: 2.3 K/uL (ref 0.7–4.0)
MCH: 32.8 pg (ref 26.0–34.0)
MCHC: 33 g/dL (ref 30.0–36.0)
MCV: 99.5 fL (ref 80.0–100.0)
Monocytes Absolute: 0.5 K/uL (ref 0.1–1.0)
Monocytes Relative: 7 %
Neutro Abs: 3.3 K/uL (ref 1.7–7.7)
Neutrophils Relative %: 54 %
Platelets: 155 K/uL (ref 150–400)
RBC: 3.72 MIL/uL — ABNORMAL LOW (ref 4.22–5.81)
RDW: 15.7 % — ABNORMAL HIGH (ref 11.5–15.5)
WBC: 6.1 K/uL (ref 4.0–10.5)
nRBC: 0 % (ref 0.0–0.2)

## 2024-09-05 LAB — COMPREHENSIVE METABOLIC PANEL WITH GFR
ALT: 16 U/L (ref 0–44)
AST: 19 U/L (ref 15–41)
Albumin: 3.1 g/dL — ABNORMAL LOW (ref 3.5–5.0)
Alkaline Phosphatase: 66 U/L (ref 38–126)
Anion gap: 11 (ref 5–15)
BUN: 28 mg/dL — ABNORMAL HIGH (ref 6–20)
CO2: 28 mmol/L (ref 22–32)
Calcium: 8.4 mg/dL — ABNORMAL LOW (ref 8.9–10.3)
Chloride: 100 mmol/L (ref 98–111)
Creatinine, Ser: 1.58 mg/dL — ABNORMAL HIGH (ref 0.61–1.24)
GFR, Estimated: 51 mL/min — ABNORMAL LOW (ref >60)
Glucose, Bld: 117 mg/dL — ABNORMAL HIGH (ref 70–99)
Potassium: 4.2 mmol/L (ref 3.5–5.1)
Sodium: 139 mmol/L (ref 135–145)
Total Bilirubin: 0.3 mg/dL (ref 0.0–1.2)
Total Protein: 5.9 g/dL — ABNORMAL LOW (ref 6.5–8.1)

## 2024-09-05 LAB — URINALYSIS, COMPLETE (UACMP) WITH MICROSCOPIC
Bacteria, UA: NONE SEEN
Bilirubin Urine: NEGATIVE
Glucose, UA: NEGATIVE mg/dL
Ketones, ur: NEGATIVE mg/dL
Leukocytes,Ua: NEGATIVE
Nitrite: NEGATIVE
Protein, ur: NEGATIVE mg/dL
Specific Gravity, Urine: 1.018 (ref 1.005–1.030)
pH: 6 (ref 5.0–8.0)

## 2024-09-05 LAB — ECHOCARDIOGRAM COMPLETE
AR max vel: 2.56 cm2
AV Area VTI: 2.46 cm2
AV Area mean vel: 2.07 cm2
AV Mean grad: 4 mmHg
AV Peak grad: 7 mmHg
Ao pk vel: 1.32 m/s
Area-P 1/2: 3.66 cm2
Height: 64 in
S' Lateral: 3.1 cm
Weight: 3319.25 [oz_av]

## 2024-09-05 LAB — LIPID PANEL
Cholesterol: 156 mg/dL (ref 0–200)
HDL: 38 mg/dL — ABNORMAL LOW (ref >40)
LDL Cholesterol: 59 mg/dL (ref 0–99)
Total CHOL/HDL Ratio: 4.1 ratio
Triglycerides: 294 mg/dL — ABNORMAL HIGH (ref <150)
VLDL: 59 mg/dL — ABNORMAL HIGH (ref 0–40)

## 2024-09-05 LAB — TROPONIN I (HIGH SENSITIVITY)
Troponin I (High Sensitivity): 14 ng/L (ref <18)
Troponin I (High Sensitivity): 15 ng/L (ref <18)

## 2024-09-05 LAB — SODIUM, URINE, RANDOM: Sodium, Ur: 74 mmol/L

## 2024-09-05 LAB — HEPARIN LEVEL (UNFRACTIONATED): Heparin Unfractionated: 0.41 [IU]/mL (ref 0.30–0.70)

## 2024-09-05 LAB — MAGNESIUM: Magnesium: 2 mg/dL (ref 1.7–2.4)

## 2024-09-05 LAB — HEMOGLOBIN A1C
Hgb A1c MFr Bld: 5.7 % — ABNORMAL HIGH (ref 4.8–5.6)
Mean Plasma Glucose: 117 mg/dL

## 2024-09-05 LAB — CREATININE, URINE, RANDOM: Creatinine, Urine: 120 mg/dL

## 2024-09-05 LAB — GLUCOSE, CAPILLARY: Glucose-Capillary: 94 mg/dL (ref 70–99)

## 2024-09-05 MED ORDER — AMLODIPINE BESYLATE 5 MG PO TABS
5.0000 mg | ORAL_TABLET | Freq: Every day | ORAL | Status: DC
  Administered 2024-09-05: 5 mg via ORAL
  Filled 2024-09-05: qty 1

## 2024-09-05 MED ORDER — PANTOPRAZOLE SODIUM 40 MG IV SOLR
40.0000 mg | INTRAVENOUS | Status: DC
  Administered 2024-09-05: 40 mg via INTRAVENOUS
  Filled 2024-09-05: qty 10

## 2024-09-05 MED ORDER — ROSUVASTATIN CALCIUM 20 MG PO TABS
40.0000 mg | ORAL_TABLET | Freq: Every day | ORAL | Status: DC
  Filled 2024-09-05: qty 2

## 2024-09-05 MED ORDER — HEPARIN (PORCINE) 25000 UT/250ML-% IV SOLN
1150.0000 [IU]/h | INTRAVENOUS | Status: DC
  Administered 2024-09-05: 1150 [IU]/h via INTRAVENOUS

## 2024-09-05 MED ORDER — CLOPIDOGREL BISULFATE 75 MG PO TABS
75.0000 mg | ORAL_TABLET | Freq: Every day | ORAL | Status: DC
  Administered 2024-09-05: 75 mg via ORAL
  Filled 2024-09-05: qty 1

## 2024-09-05 MED ORDER — CARVEDILOL 3.125 MG PO TABS: 3.1250 mg | ORAL_TABLET | Freq: Two times a day (BID) | ORAL | Status: DC

## 2024-09-05 MED ORDER — INSULIN ASPART 100 UNIT/ML IJ SOLN: 0.0000 [IU] | Freq: Four times a day (QID) | INTRAMUSCULAR | Status: DC

## 2024-09-05 MED ORDER — ISOSORBIDE MONONITRATE ER 30 MG PO TB24: 30.0000 mg | ORAL_TABLET | Freq: Every evening | ORAL | Status: DC

## 2024-09-05 MED ORDER — MELATONIN 3 MG PO TABS: 3.0000 mg | ORAL_TABLET | Freq: Every evening | ORAL | Status: DC | PRN

## 2024-09-05 MED ORDER — ACETAMINOPHEN 650 MG RE SUPP: 650.0000 mg | Freq: Four times a day (QID) | RECTAL | Status: DC | PRN

## 2024-09-05 MED ORDER — AMLODIPINE BESYLATE 5 MG PO TABS
5.0000 mg | ORAL_TABLET | Freq: Every day | ORAL | 0 refills | Status: DC
  Filled 2024-09-05: qty 30, 30d supply, fill #0

## 2024-09-05 MED ORDER — ACETAMINOPHEN 325 MG PO TABS: 650.0000 mg | ORAL_TABLET | Freq: Four times a day (QID) | ORAL | Status: DC | PRN

## 2024-09-05 MED ORDER — PRAVASTATIN SODIUM 40 MG PO TABS: 40.0000 mg | ORAL_TABLET | Freq: Every day | ORAL | Status: DC

## 2024-09-05 MED ORDER — ISOSORBIDE MONONITRATE ER 60 MG PO TB24
60.0000 mg | ORAL_TABLET | Freq: Every morning | ORAL | Status: DC
  Administered 2024-09-05: 60 mg via ORAL
  Filled 2024-09-05: qty 1

## 2024-09-05 MED ORDER — ASPIRIN 81 MG PO TBEC
81.0000 mg | DELAYED_RELEASE_TABLET | Freq: Every day | ORAL | Status: DC
  Administered 2024-09-05: 81 mg via ORAL
  Filled 2024-09-05: qty 1

## 2024-09-05 MED ORDER — ONDANSETRON HCL 4 MG/2ML IJ SOLN: 4.0000 mg | Freq: Four times a day (QID) | INTRAMUSCULAR | Status: DC | PRN

## 2024-09-05 MED ORDER — NITROGLYCERIN 0.4 MG SL SUBL: 0.4000 mg | SUBLINGUAL_TABLET | SUBLINGUAL | Status: DC | PRN

## 2024-09-05 NOTE — TOC CM/SW Note (Signed)
 Transition of Care [facility]) - Inpatient Brief Assessment   Patient Details  Name: [name] [name] MRN: [id number] Date of Birth: 1966/08/17  Transition of Care [facility]) CM/SW Contact:    Waddell Barnie Rama, RN Phone Number: 09/05/2024, 3:24 PM   Clinical Narrative: From home with spouse, has PCP and insurance on file, states has no HH services in place at this time , has a cane and a walking stick at home.  States family member  (wife) will transport them home at [employer] and family is support system, states gets medications from [facility] in [place].  Pta self ambulatory.   There are no ICM needs identified  at this time.  Please place consult for ICM needs.     Transition of Care Asessment: Insurance and Status: Insurance coverage has been reviewed Patient has primary care physician: Yes Home environment has been reviewed: home with wife Prior level of function:: indep Prior/Current Home Services: Current home services (cane, walking stick use prn) Social Drivers of Health Review: SDOH reviewed no interventions necessary Readmission risk has been reviewed: Yes Transition of care needs: no transition of care needs at this time

## 2024-09-05 NOTE — Assessment & Plan Note (Signed)
 Smoking cessation counseling   Anxiety, continue with as needed diazepam .

## 2024-09-05 NOTE — Care Management Obs Status (Signed)
 MEDICARE OBSERVATION STATUS NOTIFICATION   Patient Details  Name: [name] [name] MRN: [id number] Date of Birth: Nov 03, 1965   Medicare Observation Status Notification Given:  Yes    Waddell Barnie Rama, RN 09/05/2024, 4:12 PM

## 2024-09-05 NOTE — Assessment & Plan Note (Signed)
 Positive angina.  01/2024 cardiac catheterization with severe native coronary vessel disease.  Patent LIMA to LAD, SVG diagonal portion of jump graft with only moderate stenosis.   Echocardiogram with preserved LV systolic function with EF 55 to 60%, with no regional wall motion abnormalities, mild asymmetric LVH, RV systolic function preserved, no significant valvular disease.   Plan to continue antiplatelet therapy with aspirin  and clopidogrel .  Isosorbide  and as needed nitroglycerin   Added amlodipine Patient did not tolerate ranolazine  in the past.

## 2024-09-05 NOTE — Assessment & Plan Note (Signed)
Continue blood pressure control with amlodipine and carvedilol.

## 2024-09-05 NOTE — Hospital Course (Addendum)
 [name] was admitted to the hospital with the working diagnosis of chest pain   58 yo male with the past medical history of coronary artery disease, sp CABG, and subsequent PCI, T2DM, hypertension, CKD and hyperlipidemia who presented with chest pain.  Patient reported 2 days of non radiating chest pain, pressure type, worse with exertion and no occurring at rest as well. No associated dyspnea or diaphoresis. On the day of admission he had more intense precordial chest pain, occurred at rest, was associated with dyspnea and had to take 3 SL nitroglycerin  to control the chest pain.  Initially evaluated at [facility] ED and transferred to [facility] for further evaluation.  At the time of his transfer his blood pressure was 147/75, HR 75 RR 18 and 02 saturation 95% Lungs with no wheezing or rhonchi, heart with S1 and S2 present and regular, abdomen with no distention and no lower extremity edema.   Na 139, K 4.2 Cl 100, bicarbonate 28, glucose 117 bun 28 cr 1,58  AST 19 ALT 16  High sensitive troponin 14 Wbc 6,1 hgb 12.2 plt 155   EKG 67 bpm, normal axis, normal intervals, qtc 409 sinus rhythm with small q waves lead II, III, aVF, with no significant ST segment or T wave changes. (Old changes).

## 2024-09-05 NOTE — Progress Notes (Signed)
 PHARMACY - ANTICOAGULATION CONSULT NOTE  Pharmacy Consult for heparin  gtt Indication: chest pain/ACS  No Known Allergies  Patient Measurements: Height: 5' 4 (162.6 cm) Weight: 94.1 kg (207 lb 7.3 oz) IBW/kg (Calculated) : 59.2 HEPARIN  DW (KG): 80  Vital Signs: Temp: 97.2 F (36.2 C) (11/28 0738) Temp Source: Axillary (11/28 0738) BP: 139/70 (11/28 0738) Pulse Rate: 60 (11/28 0738)  Labs: Recent Labs    09/05/24 0641  HGB 12.2*  HCT 37.0*  PLT 155  HEPARINUNFRC 0.41  CREATININE 1.58*  TROPONINIHS 14    Estimated Creatinine Clearance: 53.4 mL/min (A) (by C-G formula based on SCr of 1.58 mg/dL (H)).   Medical History: Past Medical History:  Diagnosis Date   Asthma    Bipolar disorder (HCC)    Chronic back pain    Coronary atherosclerosis of native coronary artery    a. CABG 2003. b. NSTEMI 2010 s/p BMS to SVG-RCA c. PTCA/stent/asp thromb to SVG-RCA 01/2010, PTCA to SVG-RCA 04/2010 d. known occlusion of SVG-RCA 2013. e. NSTEMI 07/2013  s/p PTCA/DES to SVG-diagonal/intermediate/OM1.    Essential hypertension    GERD (gastroesophageal reflux disease)    Gout    Ischemic cardiomyopathy    a. EF 35-40% by cath 07/2013.   Mixed hyperlipidemia    Sinus bradycardia    Type 2 diabetes mellitus (HCC)     Medications:  Medications Prior to Admission  Medication Sig Dispense Refill Last Dose/Taking   acetaminophen  (TYLENOL ) 325 MG tablet Take 2 tablets (650 mg total) by mouth every 4 (four) hours as needed for headache or mild pain.      allopurinol  (ZYLOPRIM ) 300 MG tablet Take 300 mg by mouth daily.  0    aspirin  EC 81 MG tablet Take 1 tablet (81 mg total) by mouth daily.      Blood Pressure Monitoring KIT 1 each by Does not apply route as directed. 1 kit 0    carvedilol  (COREG ) 3.125 MG tablet TAKE ONE TABLET BY MOUTH TWICE DAILY WITH MEALS 180 tablet 1    clopidogrel  (PLAVIX ) 75 MG tablet TAKE ONE TABLET BY MOUTH DAILY 90 tablet 2    diazepam  (VALIUM ) 10 MG tablet  Take 10 mg by mouth 2 (two) times daily.      furosemide  (LASIX ) 40 MG tablet TAKE ONE TABLET BY MOUTH DAILY 90 tablet 2    isosorbide  mononitrate (IMDUR ) 30 MG 24 hr tablet Take 2 tablets (60 mg total) by mouth in the morning AND 1 tablet (30 mg total) every evening. 270 tablet 1    Magnesium  Oxide -Mg Supplement 200 MG TABS Take 1 tablet by mouth daily.      metFORMIN  (GLUCOPHAGE ) 1000 MG tablet Take 1 tablet (1,000 mg total) by mouth daily with breakfast. 30 tablet 0    nitroGLYCERIN  (NITROSTAT ) 0.4 MG SL tablet dissolve ONE tablet UNDER THE TONGUE every FIVE minutes FOR UP TO THREE doses if needed FOR CHEST pain. IF NO RELIEF AFTER 3RD DOSE, PROCEED TO ER OR CALL 911 25 tablet 5    omega-3 acid ethyl esters (LOVAZA ) 1 g capsule Take 1 capsule by mouth every morning. & 2 capsules at night      ONETOUCH VERIO test strip       pantoprazole  (PROTONIX ) 40 MG tablet Take 40 mg by mouth daily.       potassium chloride  (KLOR-CON ) 10 MEQ tablet Take 10 mEq by mouth daily.      pravastatin  (PRAVACHOL ) 40 MG tablet Take 40 mg by mouth  daily.       Assessment: 58 y/o M transfer from [facility] hospital for evaluation of chest pain. Pt was transferred on heparin  drip. Labs from [place] reviewed on care everywhere.   11/28 AM update: HL 0.41 Hgb 12.2 PLT wnl No signs of bleeding / pauses w/ hep gtt reported by nursing   Goal of Therapy:  Heparin  level 0.3-0.7 units/ml Monitor platelets by anticoagulation protocol: Yes   Plan:  Continue heparin  infusion at 1150 units/hr Check heparin  level  in 8 hours and daily while on heparin  Continue to monitor H&H and platelets   Jaymarie Yeakel BS, PharmD, BCPS Clinical Pharmacist 09/05/2024 9:04 AM  Contact: [phone] after 3 PM

## 2024-09-05 NOTE — Progress Notes (Signed)
 Pt arrived from [facility] [place]  VSS, NSR, call bell with reach questions answered.

## 2024-09-05 NOTE — Progress Notes (Signed)
 PHARMACY - ANTICOAGULATION CONSULT NOTE  Pharmacy Consult for Heparin   Indication: chest pain/ACS  No Known Allergies  Patient Measurements: Height: 5' 4 (162.6 cm) Weight: 94.1 kg (207 lb 7.3 oz) IBW/kg (Calculated) : 59.2 HEPARIN  DW (KG): 80  Vital Signs: Temp: 97.8 F (36.6 C) (11/28 0236) Temp Source: Oral (11/28 0236) BP: 147/82 (11/28 0236) Pulse Rate: 75 (11/28 0236)  Selected Labs from outside hospital: Hgb 13.2 Plts 170 Scr 2.03  CrCl cannot be calculated (Patient's most recent lab result is older than the maximum 21 days allowed.).   Medical History: Past Medical History:  Diagnosis Date   Asthma    Bipolar disorder (HCC)    Chronic back pain    Coronary atherosclerosis of native coronary artery    a. CABG 2003. b. NSTEMI 2010 s/p BMS to SVG-RCA c. PTCA/stent/asp thromb to SVG-RCA 01/2010, PTCA to SVG-RCA 04/2010 d. known occlusion of SVG-RCA 2013. e. NSTEMI 07/2013  s/p PTCA/DES to SVG-diagonal/intermediate/OM1.    Essential hypertension    GERD (gastroesophageal reflux disease)    Gout    Ischemic cardiomyopathy    a. EF 35-40% by cath 07/2013.   Mixed hyperlipidemia    Sinus bradycardia    Type 2 diabetes mellitus [facility])     Assessment: 58 y/o M transfer from [facility] hospital for evaluation of chest pain. Pt was transferred on heparin  drip. Labs from [place] reviewed on care everywhere.   Goal of Therapy:  Heparin  level 0.3-0.7 units/ml Monitor platelets by anticoagulation protocol: Yes   Plan:  Cont heparin  at 1150 units/hr Heparin  level with AM labs  Lynwood Mckusick, PharmD, BCPS Clinical Pharmacist Phone: [phone]

## 2024-09-05 NOTE — Assessment & Plan Note (Signed)
 Renal function stable with serum cr at 1,58 with K at 4,2 and serum bicarbonate at 28  Na 139 and Mg 2.0   Plan to follow up renal function as outpatient

## 2024-09-05 NOTE — H&P (Signed)
 History and Physical      [name] [name] FMW:[id number] DOB: [birth year] DOA: 09/05/2024; DOS: 09/05/2024  PCP: Orpha Yancey LABOR, MD  Patient coming from: home   I have personally briefly reviewed patient's old medical records in [facility]  Chief Complaint: Chest pain  HPI: [name] [name] is a 58 y.o. male with medical history significant for coronary artery disease status post CABG in 2003, PCI with drug-eluting stent placement in 2014, type 2 diabetes mellitus, essential hypertension, hyperlipidemia, CKD 3A with baseline creatinine 1.5, who is admitted to [facility] on 09/05/2024 by way of transfer from [facility] emergency department with chest pain and concern for potential unstable angina after presenting from home to the latter facility complaining of chest pain.   Patient reports 2 days of nonradiating substernal chest pressure, that occurs with rest, but worsens with exertion, and nitroglycerin .  Not associate with any associated shortness of breath, palpitations, nausea, vomiting, diaphoresis, dizziness, presyncope, or syncope.  Denies any associated subjective fever, chills, rigors, or generalized myalgias.  He has a history of coronary disease status post CABG in 2013, as well as NSTEMI in 2014, at which time he underwent left-sided heart cath with PCI and drug-eluting stent placement.  Most recent echocardiogram occurred in September 2022, was notable for LVEF 55%, normal left ventricular diastolic parameters, normal right ventricular systolic function, mildly dilated bilateral atria and no evidence of significant valvular pathology.  Medical history is notable for CKD 3A with baseline creatinine of approximate 1.5.  Most recent left-sided coronary angiography occurred on February 06, 2024, with no targets for revascularization reported at that time.     [facility] ED Course:  Labs were notable for the following: High sensitive troponin noted to be 18.   CMP notable for creatinine 2.03 compared to most recent prior creatinine data point of 1.53 on 02/07/2024.  CBC notable for white cell count 8200, hemoglobin 13.2.  Per my interpretation, EKG in ED demonstrated the following: Reported show sinus rhythm without overt evidence of acute ischemic changes, including no evidence of STEMI.  Imaging in the ED, per corresponding formal radiology read, was notable for the following: Chest x-ray was reported showed no evidence of acute cardiopulmonary process.  EDP at [facility] d/w on-call [facility] cardiology, Dr. Otelia, who recommended hospitalist admission to [facility] and conveyed that cardiology will consult.   While in the ED, the following were administered: Full dose aspirin  x 1, nitroglycerin  paste, and heparin  drip.  Subsequently, the patient was admitted to [facility] for further evaluation management of presenting chest pain, with concern for potential unstable angina.  Upon arrival at [facility], patient notes very mild residual chest pain, noting significant for mid and intensity of his chest pain following application of nitroglycerin  paste approximately 5 hours ago at [facility] ED.  Presenting vital signs notable for afebrile; rates in the 60s to 70s on close other pressures in the 140s over 80s; respiratory rate 18, and oxygen  saturation 95% on room air.   Review of Systems: As per HPI otherwise 10 point review of systems negative.   Past Medical History:  Diagnosis Date   Asthma    Bipolar disorder (HCC)    Chronic back pain    Coronary atherosclerosis of native coronary artery    a. CABG 2003. b. NSTEMI 2010 s/p BMS to SVG-RCA c. PTCA/stent/asp thromb to SVG-RCA 01/2010, PTCA to SVG-RCA 04/2010 d. known occlusion of SVG-RCA 2013. e. NSTEMI 07/2013  s/p PTCA/DES to  SVG-diagonal/intermediate/OM1.    Essential hypertension    GERD (gastroesophageal reflux disease)    Gout    Ischemic cardiomyopathy    a. EF 35-40% by cath 07/2013.    Mixed hyperlipidemia    Sinus bradycardia    Type 2 diabetes mellitus [facility])     Past Surgical History:  Procedure Laterality Date   CARDIAC CATHETERIZATION  06/19/2014   Procedure: CORONARY STENT INTERVENTION;  Surgeon: Alm LELON Clay, MD;  Location: [facility] CATH LAB;  Service: Cardiovascular;;  DES to seg SVG Diag/OM/Ramus    CARDIAC CATHETERIZATION N/A 11/22/2015   Procedure: Left Heart Cath and Coronary Angiography;  Surgeon: Victory LELON Sharps, MD;  Location: [facility] INVASIVE CV LAB;  Service: Cardiovascular;  Laterality: N/A;   CARDIAC CATHETERIZATION  08/19/2020   CHOLECYSTECTOMY     CORONARY ARTERY BYPASS GRAFT     2003, LIMA to LAD; SVG to diagonal; SVG to OM, ramus, and diagonal; SVG to RCA   LEFT HEART CATH AND CORS/GRAFTS ANGIOGRAPHY N/A 08/19/2020   Procedure: LEFT HEART CATH AND CORS/GRAFTS ANGIOGRAPHY;  Surgeon: Mady Bruckner, MD;  Location: [facility] INVASIVE CV LAB;  Service: Cardiovascular;  Laterality: N/A;   LEFT HEART CATH AND CORS/GRAFTS ANGIOGRAPHY N/A 02/06/2024   Procedure: LEFT HEART CATH AND CORS/GRAFTS ANGIOGRAPHY;  Surgeon: Elmira Newman PARAS, MD;  Location: [facility] INVASIVE CV LAB;  Service: Cardiovascular;  Laterality: N/A;   LEFT HEART CATHETERIZATION WITH CORONARY ANGIOGRAM N/A 01/28/2015   Procedure: LEFT HEART CATHETERIZATION WITH CORONARY ANGIOGRAM;  Surgeon: Alm LELON Clay, MD;  Location: [facility] CATH LAB;  Service: Cardiovascular;  Laterality: N/A;   LEFT HEART CATHETERIZATION WITH CORONARY/GRAFT ANGIOGRAM N/A 07/14/2013   Procedure: LEFT HEART CATHETERIZATION WITH EL BILE;  Surgeon: Bruckner JONETTA Cash, MD;  Location: [facility] CATH LAB;  Service: Cardiovascular;  Laterality: N/A;   LEFT HEART CATHETERIZATION WITH CORONARY/GRAFT ANGIOGRAM N/A 12/29/2013   Procedure: LEFT HEART CATHETERIZATION WITH EL BILE;  Surgeon: Alm LELON Clay, MD;  Location: [facility] CATH LAB;  Service: Cardiovascular;  Laterality: N/A;   LEFT HEART CATHETERIZATION WITH CORONARY/GRAFT  ANGIOGRAM N/A 06/19/2014   Procedure: LEFT HEART CATHETERIZATION WITH EL BILE;  Surgeon: Alm LELON Clay, MD;  Location: [facility] CATH LAB;  Service: Cardiovascular;  Laterality: N/A;   Metal plate to chin     PERCUTANEOUS CORONARY STENT INTERVENTION (PCI-S)  07/14/2013   Procedure: PERCUTANEOUS CORONARY STENT INTERVENTION (PCI-S);  Surgeon: Bruckner JONETTA Cash, MD;  Location: [facility] CATH LAB;  Service: Cardiovascular;;   Pin placement left leg      Social History:  reports that he has been smoking cigarettes. He started smoking about 46 years ago. He has a 36.9 pack-year smoking history. He has never used smokeless tobacco. He reports that he does not currently use alcohol . He reports current drug use. Frequency: 2.00 times per week. Drug: Marijuana.   No Known Allergies  Family History  Problem Relation Age of Onset   Stroke Other        family Hx    Bowel Disease Mother    Hernia Father    Pneumonia Daughter     Family history reviewed and not pertinent    Prior to Admission medications   Medication Sig Start Date End Date Taking? Authorizing Provider  acetaminophen  (TYLENOL ) 325 MG tablet Take 2 tablets (650 mg total) by mouth every 4 (four) hours as needed for headache or mild pain. 06/20/14   Kilroy, Luke K, PA-C  allopurinol  (ZYLOPRIM ) 300 MG tablet Take 300 mg by mouth daily. 11/03/15  [provider]  aspirin  EC 81 MG tablet Take 1 tablet (81 mg total) by mouth daily. 12/05/12   Rockland Sherwood BROCKS, PA-C  Blood Pressure Monitoring KIT 1 each by Does not apply route as directed. 09/29/14   Debera Jayson MATSU, MD  carvedilol  (COREG ) 3.125 MG tablet TAKE ONE TABLET BY MOUTH TWICE DAILY WITH MEALS 01/17/24   Debera Jayson MATSU, MD  clopidogrel  (PLAVIX ) 75 MG tablet TAKE ONE TABLET BY MOUTH DAILY 07/18/24   Debera Jayson MATSU, MD  diazepam  (VALIUM ) 10 MG tablet Take 10 mg by mouth 2 (two) times daily.    [provider]  furosemide  (LASIX ) 40 MG tablet TAKE  ONE TABLET BY MOUTH DAILY 07/18/24   Debera Jayson MATSU, MD  isosorbide  mononitrate (IMDUR ) 30 MG 24 hr tablet Take 2 tablets (60 mg total) by mouth in the morning AND 1 tablet (30 mg total) every evening. 07/25/24   Miriam Norris, NP  Magnesium  Oxide -Mg Supplement 200 MG TABS Take 1 tablet by mouth daily. 12/14/23   [provider]  metFORMIN  (GLUCOPHAGE ) 1000 MG tablet Take 1 tablet (1,000 mg total) by mouth daily with breakfast. 02/08/24   Regalado, Belkys A, MD  nitroGLYCERIN  (NITROSTAT ) 0.4 MG SL tablet dissolve ONE tablet UNDER THE TONGUE every FIVE minutes FOR UP TO THREE doses if needed FOR CHEST pain. IF NO RELIEF AFTER 3RD DOSE, PROCEED TO ER OR CALL 911 01/17/24   Debera Jayson MATSU, MD  omega-3 acid ethyl esters (LOVAZA ) 1 g capsule Take 1 capsule by mouth every morning. & 2 capsules at night 08/18/19   [provider]  [facility] VERIO test strip  10/10/17   [provider]  pantoprazole  (PROTONIX ) 40 MG tablet Take 40 mg by mouth daily.     [provider]  potassium chloride  (KLOR-CON ) 10 MEQ tablet Take 10 mEq by mouth daily. 02/04/24   [provider]  pravastatin  (PRAVACHOL ) 40 MG tablet Take 40 mg by mouth daily. 04/14/24   [provider]     Objective    Physical Exam: Vitals:   09/05/24 0236  BP: (!) 147/82  Pulse: 75  Resp: 18  Temp: 97.8 F (36.6 C)  TempSrc: Oral  SpO2: 95%  Weight: 94.1 kg  Height: 5' 4 (1.626 m)    General: appears to be stated age; alert, oriented Skin: warm, dry, no rash Head:  AT/[place] Mouth:  Oral mucosa membranes appear moist, normal dentition Neck: supple; trachea midline Heart:  RRR; did not appreciate any M/R/G Lungs: CTAB, did not appreciate any wheezes, rales, or rhonchi Abdomen: + BS; soft, ND, NT Vascular: 2+ pedal pulses b/l; 2+ radial pulses b/l Extremities: no peripheral edema, no muscle wasting          Labs on Admission: I have personally reviewed following labs and  imaging studies  CBC: No results for input(s): WBC, NEUTROABS, HGB, HCT, MCV, PLT in the last 168 hours. Basic Metabolic Panel: No results for input(s): NA, K, CL, CO2, GLUCOSE, BUN, CREATININE, CALCIUM , MG, PHOS in the last 168 hours. GFR: CrCl cannot be calculated (Patient's most recent lab result is older than the maximum 21 days allowed.). Liver Function Tests: No results for input(s): AST, ALT, ALKPHOS, BILITOT, PROT, ALBUMIN in the last 168 hours. No results for input(s): LIPASE, AMYLASE in the last 168 hours. No results for input(s): AMMONIA in the last 168 hours. Coagulation Profile: No results for input(s): INR, PROTIME in the last 168 hours. Cardiac Enzymes: No results  for input(s): CKTOTAL, CKMB, CKMBINDEX, TROPONINI in the last 168 hours. BNP (last 3 results) No results for input(s): PROBNP in the last 8760 hours. HbA1C: No results for input(s): HGBA1C in the last 72 hours. CBG: No results for input(s): GLUCAP in the last 168 hours. Lipid Profile: No results for input(s): CHOL, HDL, LDLCALC, TRIG, CHOLHDL, LDLDIRECT in the last 72 hours. Thyroid Function Tests: No results for input(s): TSH, T4TOTAL, FREET4, T3FREE, THYROIDAB in the last 72 hours. Anemia Panel: No results for input(s): VITAMINB12, FOLATE, FERRITIN, TIBC, IRON, RETICCTPCT in the last 72 hours. Urine analysis:    Component Value Date/Time   COLORURINE YELLOW 03/18/2009 1316   APPEARANCEUR CLEAR 03/18/2009 1316   LABSPEC 1.010 03/18/2009 1316   PHURINE 5.5 03/18/2009 1316   GLUCOSEU NEGATIVE 03/18/2009 1316   HGBUR NEGATIVE 03/18/2009 1316   BILIRUBINUR NEGATIVE 03/18/2009 1316   KETONESUR NEGATIVE 03/18/2009 1316   PROTEINUR NEGATIVE 03/18/2009 1316   UROBILINOGEN 0.2 03/18/2009 1316   NITRITE NEGATIVE 03/18/2009 1316   LEUKOCYTESUR  03/18/2009 1316    NEGATIVE MICROSCOPIC NOT DONE ON URINES WITH  NEGATIVE PROTEIN, BLOOD, LEUKOCYTES, NITRITE, OR GLUCOSE <1000 mg/dL.    Radiological Exams on Admission: No results found.    Assessment/Plan   Principal Problem:   Chest pain Active Problems:   DM2 (diabetes mellitus, type 2) (HCC)   Acute renal failure superimposed on stage 3a chronic kidney disease (HCC)   History of essential hypertension   GERD (gastroesophageal reflux disease)      #) Chest pain: Exertional chest pain over the course of the last 2 days, improving with sublingual nitroglycerin , starting for potential unstable angina, noting the patient's history of coronary disease status post CABG in 2013, with 2014 with drug-eluting stent placement at that time.  High sensitive troponin I x 1 is not elevated, while EKG was reported to show no evidence of ischemic ischemic changes, including no evidence of STEMI, will performed at an [facility] in ED earlier today.  Will check updated EKG at this time.  Additionally, chest x-ray is [facility] was reported showed no evidence of acute cardiopulmonary process, including no evidence of pneumothorax.  He received full dose aspirin  x 1 will at [facility] ED earlier this evening, along with initiation of heparin  drip at that time.  Will continue heparin  drip for now, trend troponin, pursue echocardiogram.    EDP at [facility] d/w on-call [facility] cardiology, Dr. Otelia, who recommended hospitalist admission to [facility] and conveyed that cardiology will consult.   Plan: Monitor on symmetry.  Repeat CMP, CBC, magnesium  level.  Continue heparin  drip.  Have notified on-call cardiology, Dr. Otelia, with the patient's arrival at [facility].  N.p.o. pending cardiology evaluation.  Stat EKG x 1 now.  Trend troponin.                    #) Acute Kidney Injury superimposed on CKD 3A:   Recommend history of CKD 3A with baseline creatinine of approximately 1.5, with most recent prior creatinine data point noted to be 1.53 on 02/07/2024,  presenting creatinine elevated to 2.03.  Unclear source at this time.  Plan: monitor strict I's & O's and daily weights. Attempt to avoid nephrotoxic agents. Refrain from NSAIDs. Repeat CMP in the morning. Check serum magnesium  level.  Check urinalysis with microscopy.  Add-on random urine sodium and random urine creatinine.                         #)  Type 2 Diabetes Mellitus: documented history of such. Home insulin  regimen: None. Home oral hypoglycemic agents: Metformin . presenting blood sugar: 122. Most recent A1c noted to be 5.3% when checked in April 2025.  Plan: In the setting of current n.p.o. status, will pursue Accu-Cheks every 6 hours with associated low-dose sliding scale insulin .  Add on hemoglobin A1c level.  Hold home metformin  for now.                        #) Essential Hypertension: documented h/o such, with outpatient antihypertensive regimen including Imdur , Coreg .  SBP's upon arrival at [facility] noted to be in the 140s.  Plan: Close monitoring of subsequent BP via routine VS. in the setting of current n.p.o. status, pending evaluation by cardiology, will hold home Coreg  and Imdur  for now.  Monitor strict I's and O's and daily weights.  Monitor on telemetry.                         #) GERD: documented h/o such; on Protonix  as outpatient.   Plan: In setting of current n.p.o. status, will proceed Protonix  40 mg IV daily.         DVT prophylaxis: SCD's + continuation of heparin  drip that was started at [facility] ED this evening Code Status: Full code Family Communication: none Disposition Plan: Per Rounding Team Consults called: EDP at [facility] d/w on-call [facility] cardiology, Dr. Otelia, who recommended hospitalist admission to [facility] and conveyed that cardiology will consult. I've notified Dr. Otelia of the patient's arrival at [facility].;  Admission status: Inpatient     I SPENT GREATER THAN 75  MINUTES  IN CLINICAL CARE TIME/MEDICAL DECISION-MAKING IN COMPLETING THIS ADMISSION.      Turner Baillie B Frenchie Pribyl DO [facility]  From 7PM - 7AM   09/05/2024, 4:40 AM

## 2024-09-05 NOTE — Discharge Summary (Signed)
 Physician Discharge Summary   Patient: [name] [name] MRN: [id number] DOB: Jul 25, 1966  Admit date:     09/05/2024  Discharge date: 09/05/24  Discharge Physician: Elidia Sieving Senay Sistrunk   PCP: Orpha Yancey LABOR, MD   Recommendations at discharge:    Patient was placed on amlodipine for chronic angina, continue dual antiplatelet therapy, isosorbide  and as needed nitroglycerin .  Smoking cessation counseling.  Follow up with Dr Orpha in 7 to 10 days Follow up with Cardiology as scheduled.   Discharge Diagnoses: Principal Problem:   Chest pain Active Problems:   DM2 (diabetes mellitus, type 2) (HCC)   Acute renal failure superimposed on stage 3a chronic kidney disease (HCC)   History of essential hypertension   GERD (gastroesophageal reflux disease)  Resolved Problems:   * No resolved hospital problems. [facility] Course: [name] [name] was admitted to the hospital with the working diagnosis of chest pain   58 yo male with the past medical history of coronary artery disease, sp CABG, and subsequent PCI, T2DM, hypertension, CKD and hyperlipidemia who presented with chest pain.  Patient reported 2 days of non radiating chest pain, pressure type, worse with exertion and no occurring at rest as well. No associated dyspnea or diaphoresis. On the day of admission he had more intense precordial chest pain, occurred at rest, was associated with dyspnea and had to take 3 SL nitroglycerin  to control the chest pain.  Initially evaluated at [facility] ED and transferred to [facility] for further evaluation.  At the time of his transfer his blood pressure was 147/75, HR 75 RR 18 and 02 saturation 95% Lungs with no wheezing or rhonchi, heart with S1 and S2 present and regular, abdomen with no distention and no lower extremity edema.   Na 139, K 4.2 Cl 100, bicarbonate 28, glucose 117 bun 28 cr 1,58  AST 19 ALT 16  High sensitive troponin 14 Wbc 6,1 hgb 12.2 plt 155   EKG 67 bpm, normal axis, normal  intervals, qtc 409 sinus rhythm with small q waves lead II, III, aVF, with no significant ST segment or T wave changes. (Old changes).   Assessment and Plan: * Coronary artery disease Positive angina.  01/2024 cardiac catheterization with severe native coronary vessel disease.  Patent LIMA to LAD, SVG diagonal portion of jump graft with only moderate stenosis.   Echocardiogram with preserved LV systolic function with EF 55 to 60%, with no regional wall motion abnormalities, mild asymmetric LVH, RV systolic function preserved, no significant valvular disease.   Plan to continue antiplatelet therapy with aspirin  and clopidogrel .  Isosorbide  and as needed nitroglycerin   Added amlodipine Patient did not tolerate ranolazine  in the past.   Essential hypertension Continue blood pressure control with amlodipine and carvedilol .   Chronic kidney disease, stage 3a (HCC) Renal function stable with serum cr at 1,58 with K at 4,2 and serum bicarbonate at 28  Na 139 and Mg 2.0   Plan to follow up renal function as outpatient   Type 2 diabetes mellitus with hyperlipidemia (HCC) Continue diabetic control with metformin   Controlled disease with Hgb A1x of 5.3  Continue statin therapy   GERD (gastroesophageal reflux disease) Continue pantoprazole    Tobacco abuse Smoking cessation counseling   Anxiety, continue with as needed diazepam .   Obesity, class 2 Calculated BMI is 35.6        Consultants: cardiology  Procedures performed: none   Disposition: Home Diet recommendation:  Cardiac and Carb modified diet DISCHARGE MEDICATION: Allergies as  of 09/05/2024   No Known Allergies      Medication List     TAKE these medications    acetaminophen  325 MG tablet Commonly known as: TYLENOL  Take 2 tablets (650 mg total) by mouth every 4 (four) hours as needed for headache or mild pain.   allopurinol  300 MG tablet Commonly known as: ZYLOPRIM  Take 300 mg by mouth daily.   amLODipine  5 MG tablet Commonly known as: NORVASC Take 1 tablet (5 mg total) by mouth daily. Start taking on: September 06, 2024   aspirin  EC 81 MG tablet Take 1 tablet (81 mg total) by mouth daily.   Blood Pressure Monitoring Kit 1 each by Does not apply route as directed.   carvedilol  3.125 MG tablet Commonly known as: COREG  TAKE ONE TABLET BY MOUTH TWICE DAILY WITH MEALS   clopidogrel  75 MG tablet Commonly known as: PLAVIX  TAKE ONE TABLET BY MOUTH DAILY   diazepam  10 MG tablet Commonly known as: VALIUM  Take 10 mg by mouth 2 (two) times daily.   furosemide  40 MG tablet Commonly known as: LASIX  TAKE ONE TABLET BY MOUTH DAILY   isosorbide  mononitrate 30 MG 24 hr tablet Commonly known as: IMDUR  Take 2 tablets (60 mg total) by mouth in the morning AND 1 tablet (30 mg total) every evening.   Magnesium  Oxide -Mg Supplement 200 MG Tabs Take 1 tablet by mouth daily.   metFORMIN  1000 MG tablet Commonly known as: GLUCOPHAGE  Take 1 tablet (1,000 mg total) by mouth daily with breakfast.   nitroGLYCERIN  0.4 MG SL tablet Commonly known as: NITROSTAT  dissolve ONE tablet UNDER THE TONGUE every FIVE minutes FOR UP TO THREE doses if needed FOR CHEST pain. IF NO RELIEF AFTER 3RD DOSE, PROCEED TO ER OR CALL 911   omega-3 acid ethyl esters 1 g capsule Commonly known as: LOVAZA  Take 1 capsule by mouth every morning. & 2 capsules at night   OneTouch Verio test strip Generic drug: glucose blood   pantoprazole  40 MG tablet Commonly known as: PROTONIX  Take 40 mg by mouth daily.   potassium chloride  10 MEQ tablet Commonly known as: KLOR-CON  Take 10 mEq by mouth daily.   pravastatin  40 MG tablet Commonly known as: PRAVACHOL  Take 40 mg by mouth daily.        Discharge Exam: Filed Weights   09/05/24 0236  Weight: 94.1 kg   BP (!) 130/90 (BP Location: Left Arm)   Pulse 63   Temp 97.6 F (36.4 C) (Oral)   Resp 19   Ht 5' 4 (1.626 m)   Wt 94.1 kg   SpO2 96%   BMI 35.61 kg/m    Patient with no further chest pain, no dyspnea, no abdominal pain/   Neurology awake and alert ENT with mild pallor with no icterus Cardiovascular with S1 and S2 present and regular with no gallops, rubs or murmurs Respiratory with no rales or wheezing, no rhonchi  Abdomen with no distention No lower extremity edema    Condition at discharge: stable  The results of significant diagnostics from this hospitalization (including imaging, microbiology, ancillary and laboratory) are listed below for reference.   Imaging Studies: ECHOCARDIOGRAM COMPLETE Result Date: 09/05/2024    ECHOCARDIOGRAM REPORT   Patient Name:   ZAKARY KIMURA Date of Exam: 09/05/2024 Medical Rec #:  [id number]        Height:       64.0 in Accession #:    [id number]       Weight:  207.5 lb Date of Birth:  November 18, 1965         BSA:          1.987 m Patient Age:    58 years         BP:           147/82 mmHg Patient Gender: M                HR:           61 bpm. Exam Location:  Inpatient Procedure: 2D Echo, Cardiac Doppler and Color Doppler (Both Spectral and Color            Flow Doppler were utilized during procedure). Indications:    Chest Pain R07.9  History:        Patient has prior history of Echocardiogram examinations, most                 recent 07/02/2021. CAD and Previous Myocardial Infarction, Prior                 CABG; Risk Factors:Current Smoker and Diabetes.  Sonographer:    Jayson Gaskins Referring Phys: [id number] JUSTIN B HOWERTER IMPRESSIONS  1. Left ventricular ejection fraction, by estimation, is 55 to 60%. The left ventricle has normal function. The left ventricle has no regional wall motion abnormalities. There is mild asymmetric left ventricular hypertrophy. Left ventricular diastolic parameters were normal.  2. Right ventricular systolic function is low normal. The right ventricular size is normal. Tricuspid regurgitation signal is inadequate for assessing PA pressure.  3. The mitral valve is normal in  structure. Trivial mitral valve regurgitation. No evidence of mitral stenosis.  4. The aortic valve is tricuspid. Aortic valve regurgitation is not visualized. Aortic valve sclerosis/calcification is present, without any evidence of aortic stenosis.  5. The inferior vena cava is normal in size with greater than 50% respiratory variability, suggesting right atrial pressure of 3 mmHg. Comparison(s): No significant change from prior study. FINDINGS  Left Ventricle: Left ventricular ejection fraction, by estimation, is 55 to 60%. The left ventricle has normal function. The left ventricle has no regional wall motion abnormalities. The left ventricular internal cavity size was normal in size. There is  mild asymmetric left ventricular hypertrophy. Left ventricular diastolic parameters were normal. Right Ventricle: The right ventricular size is normal. No increase in right ventricular wall thickness. Right ventricular systolic function is low normal. Tricuspid regurgitation signal is inadequate for assessing PA pressure. Left Atrium: Left atrial size was normal in size. Right Atrium: Right atrial size was normal in size. Pericardium: There is no evidence of pericardial effusion. Presence of epicardial fat layer. Mitral Valve: The mitral valve is normal in structure. Trivial mitral valve regurgitation. No evidence of mitral valve stenosis. Tricuspid Valve: The tricuspid valve is grossly normal. Tricuspid valve regurgitation is trivial. No evidence of tricuspid stenosis. Aortic Valve: The aortic valve is tricuspid. Aortic valve regurgitation is not visualized. Aortic valve sclerosis/calcification is present, without any evidence of aortic stenosis. Aortic valve mean gradient measures 4.0 mmHg. Aortic valve peak gradient measures 7.0 mmHg. Aortic valve area, by VTI measures 2.46 cm. Pulmonic Valve: The pulmonic valve was normal in structure. Pulmonic valve regurgitation is not visualized. No evidence of pulmonic stenosis.  Aorta: The aortic root is normal in size and structure. Venous: The inferior vena cava is normal in size with greater than 50% respiratory variability, suggesting right atrial pressure of 3 mmHg. IAS/Shunts: No atrial level shunt detected by color flow  Doppler.  LEFT VENTRICLE PLAX 2D LVIDd:         5.00 cm   Diastology LVIDs:         3.10 cm   LV e' medial:    7.94 cm/s LV PW:         1.10 cm   LV E/e' medial:  9.4 LV IVS:        0.70 cm   LV e' lateral:   12.00 cm/s LVOT diam:     1.80 cm   LV E/e' lateral: 6.2 LV SV:         67 LV SV Index:   34 LVOT Area:     2.54 cm  RIGHT VENTRICLE RV S prime:     8.92 cm/s LEFT ATRIUM             Index        RIGHT ATRIUM           Index LA Vol (A2C):   35.4 ml 17.82 ml/m  RA Area:     22.00 cm LA Vol (A4C):   30.3 ml 15.25 ml/m  RA Volume:   62.90 ml  31.66 ml/m LA Biplane Vol: 32.4 ml 16.31 ml/m  AORTIC VALVE AV Area (Vmax):    2.56 cm AV Area (Vmean):   2.07 cm AV Area (VTI):     2.46 cm AV Vmax:           132.00 cm/s AV Vmean:          96.900 cm/s AV VTI:            0.271 m AV Peak Grad:      7.0 mmHg AV Mean Grad:      4.0 mmHg LVOT Vmax:         133.00 cm/s LVOT Vmean:        78.800 cm/s LVOT VTI:          0.262 m LVOT/AV VTI ratio: 0.97  AORTA Ao Root diam: 2.80 cm MITRAL VALVE MV Area (PHT): 3.66 cm    SHUNTS MV Decel Time: 207 msec    Systemic VTI:  0.26 m MV E velocity: 74.40 cm/s  Systemic Diam: 1.80 cm MV A velocity: 81.20 cm/s MV E/A ratio:  0.92 Georganna Archer Electronically signed by Georganna Archer Signature Date/Time: 09/05/2024/1:18:08 PM    Final    VAS US  LOWER EXTREMITY ARTERIAL DUPLEX Result Date: 08/26/2024 LOWER EXTREMITY ARTERIAL DUPLEX STUDY Patient Name:  SAKAI WOLFORD  Date of Exam:   08/26/2024 Medical Rec #: [id number]         Accession #:    [id number] Date of Birth: April 02, 1966          Patient Gender: M Patient Age:   37 years Exam Location:  [place] Procedure:      VAS US  LOWER EXTREMITY ARTERIAL DUPLEX Referring Phys: ALMARIE  PECK --------------------------------------------------------------------------------  Indications: Pain in both lower extremities. High Risk Factors: Hypertension, hyperlipidemia, Diabetes, current smoker, prior                    MI, coronary artery disease.  Current ABI: 08/07/2024 RT: 1.14/0.88              LT: 0.71/0.57 Comparison Study: None. Performing Technologist: Bascom Burows RCS, RVS  Examination Guidelines: A complete evaluation includes B-mode imaging, spectral Doppler, color Doppler, and power Doppler as needed of all accessible portions of each vessel. Bilateral testing is considered an integral part of a  complete examination. Limited examinations for reoccurring indications may be performed as noted.  +----------+--------+-----+--------+---------+--------+ RIGHT     PSV cm/sRatioStenosisWaveform Comments +----------+--------+-----+--------+---------+--------+ CFA Prox  110                  triphasic         +----------+--------+-----+--------+---------+--------+ DFA       164                  biphasic          +----------+--------+-----+--------+---------+--------+ SFA Prox  84                   triphasic         +----------+--------+-----+--------+---------+--------+ SFA Mid   103                  triphasic         +----------+--------+-----+--------+---------+--------+ SFA Distal46                   triphasic         +----------+--------+-----+--------+---------+--------+ POP Prox  44                   triphasic         +----------+--------+-----+--------+---------+--------+ POP Distal44                   triphasic         +----------+--------+-----+--------+---------+--------+ TP Trunk  29                   triphasic         +----------+--------+-----+--------+---------+--------+ ATA Prox  46                   triphasic         +----------+--------+-----+--------+---------+--------+ PTA Prox  22                   biphasic           +----------+--------+-----+--------+---------+--------+ PTA Mid   10                   biphasic          +----------+--------+-----+--------+---------+--------+ PTA Distal17                   biphasic          +----------+--------+-----+--------+---------+--------+ PERO Mid  27                   biphasic          +----------+--------+-----+--------+---------+--------+ DP        56                   triphasic         +----------+--------+-----+--------+---------+--------+  +----------+--------+-----+--------+-------------------+--------+ LEFT      PSV cm/sRatioStenosisWaveform           Comments +----------+--------+-----+--------+-------------------+--------+ CFA Prox  100                  biphasic                    +----------+--------+-----+--------+-------------------+--------+ DFA       124                  biphasic                    +----------+--------+-----+--------+-------------------+--------+ SFA Prox  51  biphasic                    +----------+--------+-----+--------+-------------------+--------+ SFA Mid   63                   monophasic                  +----------+--------+-----+--------+-------------------+--------+ SFA Distal215                  Stenotic                    +----------+--------+-----+--------+-------------------+--------+ POP Prox  22                   monophasic                  +----------+--------+-----+--------+-------------------+--------+ POP Distal37                   monophasic                  +----------+--------+-----+--------+-------------------+--------+ TP Trunk  24                   monophasic                  +----------+--------+-----+--------+-------------------+--------+ ATA Prox  18                   dampened monophasic         +----------+--------+-----+--------+-------------------+--------+ PTA Prox  19                   dampened  monophasic         +----------+--------+-----+--------+-------------------+--------+ PTA Mid   20                   monophasic                  +----------+--------+-----+--------+-------------------+--------+ PTA Distal16                   monophasic                  +----------+--------+-----+--------+-------------------+--------+ PERO Mid  14                   monophasic                  +----------+--------+-----+--------+-------------------+--------+ DP        23                   dampened monophasic         +----------+--------+-----+--------+-------------------+--------+   Summary: Right: Atherosclerotic plaque throughout right lower extremity. Consistent with at least mild disease. Not hemodynamically significant. Left: Large atherosclerotic plaque throughout the left femoral artery with significant narrowing at the distal artery with a increased psv of 215 cm/s. These findings are consistent with a 50-74% stenosis of the left distal femoral artery.  See table(s) above for measurements and observations. Suggest follow up study in 12 months. Electronically signed by Evalene Lunger MD on 08/26/2024 at 9:14:00 PM.    Final    VAS US  ABI WITH/WO TBI Result Date: 08/08/2024  LOWER EXTREMITY DOPPLER STUDY Patient Name:  CHILD CAMPOY  Date of Exam:   08/07/2024 Medical Rec #: [id number]         Accession #:    [id number] Date of Birth: [birth year]          Patient Gender: CHRISTELLA  Patient Age:   62 years Exam Location:  [place] Procedure:      VAS US  ABI WITH/WO TBI Referring Phys: ALMARIE PECK --------------------------------------------------------------------------------  Indications: Claudication. High Risk Factors: Hypertension, hyperlipidemia, Diabetes, current smoker,                    coronary artery disease.  Comparison Study: None. Performing Technologist: Elby President RVS, RCS  Examination Guidelines: A complete evaluation includes at minimum, Doppler waveform signals and  systolic blood pressure reading at the level of bilateral brachial, anterior tibial, and posterior tibial arteries, when vessel segments are accessible. Bilateral testing is considered an integral part of a complete examination. Photoelectric Plethysmograph (PPG) waveforms and toe systolic pressure readings are included as required and additional duplex testing as needed. Limited examinations for reoccurring indications may be performed as noted.  ABI Findings: +---------+------------------+-----+-------------------+--------+ Right    Rt Pressure (mmHg)IndexWaveform           Comment  +---------+------------------+-----+-------------------+--------+ Brachial 116                                                +---------+------------------+-----+-------------------+--------+ PTA      136               1.14 dampened monophasic         +---------+------------------+-----+-------------------+--------+ DP       128               1.08 biphasic                    +---------+------------------+-----+-------------------+--------+ Great Toe105               0.88                             +---------+------------------+-----+-------------------+--------+ +---------+------------------+-----+-------------------+-------+ Left     Lt Pressure (mmHg)IndexWaveform           Comment +---------+------------------+-----+-------------------+-------+ Brachial 119                                               +---------+------------------+-----+-------------------+-------+ PTA      81                0.68 dampened monophasic        +---------+------------------+-----+-------------------+-------+ DP       85                0.71 dampened monophasic        +---------+------------------+-----+-------------------+-------+ Great Toe68                0.57                            +---------+------------------+-----+-------------------+-------+  +-------+-----------+-----------+------------+------------+ ABI/TBIToday's ABIToday's TBIPrevious ABIPrevious TBI +-------+-----------+-----------+------------+------------+ Right  1.14       0.88                                +-------+-----------+-----------+------------+------------+ Left   0.71       0.577                               +-------+-----------+-----------+------------+------------+  Summary: Right: Resting right ankle-brachial index indicates noncompressible right lower extremity arteries. The right toe-brachial index is normal.  Left: Resting left ankle-brachial index indicates moderate left lower extremity arterial disease. The left toe-brachial index is abnormal.  *See table(s) above for measurements and observations.  Suggest Peripheral Vascular Consult. Electronically signed by Deatrice Cage MD on 08/08/2024 at 4:04:22 PM.    Final     Microbiology: Results for orders placed or performed during the hospital encounter of 07/01/21  Resp Panel by RT-PCR (Flu A&B, Covid) Nasopharyngeal Swab     Status: None   Collection Time: 07/01/21  4:29 PM   Specimen: Nasopharyngeal Swab; Nasopharyngeal(NP) swabs in vial transport medium  Result Value Ref Range Status   SARS Coronavirus 2 by RT PCR NEGATIVE NEGATIVE Final    Comment: (NOTE) SARS-CoV-2 target nucleic acids are NOT DETECTED.  The SARS-CoV-2 RNA is generally detectable in upper respiratory specimens during the acute phase of infection. The lowest concentration of SARS-CoV-2 viral copies this assay can detect is 138 copies/mL. A negative result does not preclude SARS-Cov-2 infection and should not be used as the sole basis for treatment or other patient management decisions. A negative result may occur with  improper specimen collection/handling, submission of specimen other than nasopharyngeal swab, presence of viral mutation(s) within the areas targeted by this assay, and inadequate number of  viral copies(<138 copies/mL). A negative result must be combined with clinical observations, patient history, and epidemiological information. The expected result is Negative.  Fact Sheet for Patients:  bloggercourse.com  Fact Sheet for Healthcare Providers:  seriousbroker.it  This test is no t yet approved or cleared by the [place]  FDA and  has been authorized for detection and/or diagnosis of SARS-CoV-2 by FDA under an Emergency Use Authorization (EUA). This EUA will remain  in effect (meaning this test can be used) for the duration of the COVID-19 declaration under Section 564(b)(1) of the Act, 21 U.S.C.section 360bbb-3(b)(1), unless the authorization is terminated  or revoked sooner.       Influenza A by PCR NEGATIVE NEGATIVE Final   Influenza B by PCR NEGATIVE NEGATIVE Final    Comment: (NOTE) The Xpert Xpress SARS-CoV-2/FLU/RSV plus assay is intended as an aid in the diagnosis of influenza from Nasopharyngeal swab specimens and should not be used as a sole basis for treatment. Nasal washings and aspirates are unacceptable for Xpert Xpress SARS-CoV-2/FLU/RSV testing.  Fact Sheet for Patients: bloggercourse.com  Fact Sheet for Healthcare Providers: seriousbroker.it  This test is not yet approved or cleared by the [place]  FDA and has been authorized for detection and/or diagnosis of SARS-CoV-2 by FDA under an Emergency Use Authorization (EUA). This EUA will remain in effect (meaning this test can be used) for the duration of the COVID-19 declaration under Section 564(b)(1) of the Act, 21 U.S.C. section 360bbb-3(b)(1), unless the authorization is terminated or revoked.  Performed at [facility], [address], [place], [place] [address]     Labs: CBC: Recent Labs  Lab 09/05/24 0641  WBC 6.1  NEUTROABS 3.3  HGB 12.2*  HCT 37.0*  MCV 99.5  PLT 155    Basic Metabolic Panel: Recent Labs  Lab 09/05/24 0641  NA 139  K 4.2  CL 100  CO2 28  GLUCOSE 117*  BUN 28*  CREATININE 1.58*  CALCIUM  8.4*  MG 2.0   Liver Function Tests: Recent Labs  Lab 09/05/24 0641  AST 19  ALT 16  ALKPHOS 66  BILITOT 0.3  PROT 5.9*  ALBUMIN 3.1*   CBG: Recent Labs  Lab 09/05/24 1310  GLUCAP 94    Discharge time spent: greater than 30 minutes.  Signed: Elidia Toribio Furnace, MD [facility] 09/05/2024

## 2024-09-05 NOTE — Assessment & Plan Note (Signed)
 Continue diabetic control with metformin   Controlled disease with Hgb A1x of 5.3  Continue statin therapy

## 2024-09-05 NOTE — Care Management CC44 (Signed)
 Condition Code 44 Documentation Completed  Patient Details  Name: [name] [name] MRN: [id number] Date of Birth: 1966-02-15   Condition Code 44 given:  Yes Patient signature on Condition Code 44 notice:  Yes Documentation of 2 MD's agreement:  Yes Code 44 added to claim:  Yes    Waddell Barnie Rama, RN 09/05/2024, 4:12 PM

## 2024-09-05 NOTE — Assessment & Plan Note (Signed)
 Calculated BMI is 35.6

## 2024-09-05 NOTE — Assessment & Plan Note (Signed)
 Continue pantoprazole .

## 2024-09-05 NOTE — Progress Notes (Signed)
 DISCHARGE NOTE HOME [name] [name] to be discharged Home per MD order. Floor nurse Discussed prescriptions and follow up appointments with the patient. Prescriptions given to patient; medication list explained in detail. Patient verbalized understanding.  Skin clean, dry and intact without evidence of skin break down, no evidence of skin tears noted. IV catheter discontinued intact. Site without signs and symptoms of complications. Dressing and pressure applied. Pt denies pain at the site currently. No complaints noted.  Patient free of lines, drains, and wounds.   An After Visit Summary (AVS) was printed and given to the patient. Patient escorted via wheelchair, and discharged home via private auto.  [name], RN

## 2024-09-05 NOTE — TOC Transition Note (Signed)
 Transition of Care [facility]) - Discharge Note   Patient Details  Name: [name] [name] MRN: [id number] Date of Birth: [birth year]  [facility] CM/SW Contact:  Waddell Barnie Rama, RN Phone Number: 09/05/2024, 3:25 PM   Clinical Narrative:    For dc today, has no needs,           Patient Goals and CMS Choice            Discharge Placement                       Discharge Plan and Services Additional resources added to the After Visit Summary for                                       Social Drivers of Health (SDOH) Interventions SDOH Screenings   Food Insecurity: No Food Insecurity (09/05/2024)  Housing: Low Risk  (09/05/2024)  Transportation Needs: No Transportation Needs (09/05/2024)  Utilities: Not At Risk (09/05/2024)  Social Connections: Unknown (02/06/2024)  Tobacco Use: High Risk (09/05/2024)     Readmission Risk Interventions    09/05/2024    3:23 PM  Readmission Risk Prevention Plan  Transportation Screening Complete  Home Care Screening Complete  Medication Review (RN CM) Complete

## 2024-09-05 NOTE — Consult Note (Addendum)
 Cardiology Consultation   Patient ID: [name] [name] MRN: [id number]; DOB: 07-Jan-1966  Admit date: 09/05/2024 Date of Consult: 09/05/2024  PCP:  Orpha Yancey LABOR, MD   [facility] Providers Cardiologist:  Jayson Sierras, MD      Patient Profile: [name] [name] is a 58 y.o. male with a hx of CAD s/p CABG, type 2 DM, HTN, HLD, CKD stage IIIa, HFimpEF, asthma, GERD, bipolar disorder who is being seen 09/05/2024 for the evaluation of chest pain at the request of Dr. Noralee.  History of Present Illness: [name] [name] is a 58 year old male with above medical history who is followed by Dr. Sierras.   Patient previously underwent CABG in 2003. Later had NSTEMI in 2010 and received a BMS to the SVG-RCA. In 2011, underwent PTCA with stenting and aspiration thromb to SVG-RCA. Later in 2011, underwent PTCA to SVG-RCA. In 2013, SVG-RCA was occluded. Had another NSTEMi in 2014 and underwent PTCA/DES to the SVG-diagonal/intermediate/OM1.   Patient underwent LHC in 11/2015 and was found to have known total occlusion of the SVG-distal RCA. The sequential SVG to the diagonal and ramus intermedius was diseased with occlusion of the limb to the ramus intermedius. The side-to-side anastomosis on the diagonal remains patent. The stents in the ostium and distal saphenous vein graft sites are widely patent. Patent LIMA to LAD. No significant change was found when compared to previous images. Recommended medical therapy   Echocardiogram in 08/2020 showed EF 55-60% with regional wall motion abnormalities, normal RV systolic function, mild AS. Cath in 08/2020 showed no significant changes when compared to cath from 2017   Patient most recently underwent cardiac catheterization on 02/06/24. Cath showed severe native vessel disease. Patent LIMA-LAD, SVG-diag portion of jump graft with only moderate restenosis. Jump graft supplying Lcx/OM known to be occluded. SVG-RCA known to be occluded. Suspected that  patient's exertional angina was due to severe native vessel disease without good targets for PCI. Recommended medical management and uptitration of antianginal therapy as tolerated   Last seen by cardiology on 07/25/24. At that time, patient had slight chest discomfort with heavy exertion. He did not want to restart ranexa  because it made him feel dizzy drunk. Imdur  was increased to 60 mg in the AM, 30 mg in the PM. Remained on ASA, carvedilol , plavix , nitroglycerin .   Patient presented to [facility] on 11/27 complaining of 2 days of non radiating substernal chest pressure. Pain occurred at rest but worsened with exertion. In the ED, hsTn 18. Creatinine elevated to 2.03. CXR showed no evidence of acute cardiopulmonary process. He was transferred to [facility] for further evaluation. Here, second hsTn 14.   On interview, patient tells me that he has been having intermittent chest pain for a few weeks. Pain is sharp, pressure-like. Located in the middle of his chest. Pain seems to occur with emotional stress. He was diagnosed with bronchitis a few weeks ago. He also has chest pain when he coughs. He tells me that he stays active by working outside in his lawn, going fishing, and walking. He does not have chest pain with exertion. He denies shortness of breath. Denies nausea, vomiting, fever, chills.   Yesterday morning he woke up at about 4 AM with chest pain.  He took SL nitroglycerin  with relief. Pain returned, so he took a second dose. Again had relief, but pain returned so he took a third and went to the hospital. He has not had chest pain since his arrival in the  ED  ==> Trop Negative.    Past Medical History:  Diagnosis Date   Asthma    Bipolar disorder (HCC)    Chronic back pain    Coronary atherosclerosis of native coronary artery    a. CABG 2003. b. NSTEMI 2010 s/p BMS to SVG-RCA c. PTCA/stent/asp thromb to SVG-RCA 01/2010, PTCA to SVG-RCA 04/2010 d. known occlusion of SVG-RCA 2013. e. NSTEMI  07/2013  s/p PTCA/DES to SVG-diagonal/intermediate/OM1.    Essential hypertension    GERD (gastroesophageal reflux disease)    Gout    Ischemic cardiomyopathy    a. EF 35-40% by cath 07/2013.   Mixed hyperlipidemia    Sinus bradycardia    Type 2 diabetes mellitus [facility])     Past Surgical History:  Procedure Laterality Date   CARDIAC CATHETERIZATION  06/19/2014   Procedure: CORONARY STENT INTERVENTION;  Surgeon: Alm LELON Clay, MD;  Location: [facility] CATH LAB;  Service: Cardiovascular;;  DES to seg SVG Diag/OM/Ramus    CARDIAC CATHETERIZATION N/A 11/22/2015   Procedure: Left Heart Cath and Coronary Angiography;  Surgeon: Victory LELON Sharps, MD;  Location: [facility] INVASIVE CV LAB;  Service: Cardiovascular;  Laterality: N/A;   CARDIAC CATHETERIZATION  08/19/2020   CHOLECYSTECTOMY     CORONARY ARTERY BYPASS GRAFT     2003, LIMA to LAD; SVG to diagonal; SVG to OM, ramus, and diagonal; SVG to RCA   LEFT HEART CATH AND CORS/GRAFTS ANGIOGRAPHY N/A 08/19/2020   Procedure: LEFT HEART CATH AND CORS/GRAFTS ANGIOGRAPHY;  Surgeon: Mady Bruckner, MD;  Location: [facility] INVASIVE CV LAB;  Service: Cardiovascular;  Laterality: N/A;   LEFT HEART CATH AND CORS/GRAFTS ANGIOGRAPHY N/A 02/06/2024   Procedure: LEFT HEART CATH AND CORS/GRAFTS ANGIOGRAPHY;  Surgeon: Elmira Newman PARAS, MD;  Location: [facility] INVASIVE CV LAB;  Service: Cardiovascular;  Laterality: N/A;   LEFT HEART CATHETERIZATION WITH CORONARY ANGIOGRAM N/A 01/28/2015   Procedure: LEFT HEART CATHETERIZATION WITH CORONARY ANGIOGRAM;  Surgeon: Alm LELON Clay, MD;  Location: [facility] CATH LAB;  Service: Cardiovascular;  Laterality: N/A;   LEFT HEART CATHETERIZATION WITH CORONARY/GRAFT ANGIOGRAM N/A 07/14/2013   Procedure: LEFT HEART CATHETERIZATION WITH EL BILE;  Surgeon: Bruckner JONETTA Cash, MD;  Location: [facility] CATH LAB;  Service: Cardiovascular;  Laterality: N/A;   LEFT HEART CATHETERIZATION WITH CORONARY/GRAFT ANGIOGRAM N/A 12/29/2013   Procedure: LEFT  HEART CATHETERIZATION WITH EL BILE;  Surgeon: Alm LELON Clay, MD;  Location: [facility] CATH LAB;  Service: Cardiovascular;  Laterality: N/A;   LEFT HEART CATHETERIZATION WITH CORONARY/GRAFT ANGIOGRAM N/A 06/19/2014   Procedure: LEFT HEART CATHETERIZATION WITH EL BILE;  Surgeon: Alm LELON Clay, MD;  Location: [facility] CATH LAB;  Service: Cardiovascular;  Laterality: N/A;   Metal plate to chin     PERCUTANEOUS CORONARY STENT INTERVENTION (PCI-S)  07/14/2013   Procedure: PERCUTANEOUS CORONARY STENT INTERVENTION (PCI-S);  Surgeon: Bruckner JONETTA Cash, MD;  Location: [facility] CATH LAB;  Service: Cardiovascular;;   Pin placement left leg       Scheduled Meds:  insulin  aspart  0-9 Units Subcutaneous Q6H   pantoprazole  (PROTONIX ) IV  40 mg Intravenous Q24H   Continuous Infusions:  heparin  1,150 Units/hr (09/05/24 0600)   PRN Meds: acetaminophen  **OR** acetaminophen , melatonin, nitroGLYCERIN , ondansetron  (ZOFRAN ) IV  Allergies:   No Known Allergies  Social History:   Social History   Socioeconomic History   Marital status: Married    Spouse name: Not on file   Number of children: Not on file   Years of education: Not on file   Highest education level:  Not on file  Occupational History   Not on file  Tobacco Use   Smoking status: Every Day    Current packs/day: 0.80    Average packs/day: 0.8 packs/day for 46.2 years (36.9 ttl pk-yrs)    Types: Cigarettes    Start date: 07/01/1978   Smokeless tobacco: Never   Tobacco comments:    trying to quit, 3/4 pack per day. Started the nicotine  patch on 01-10-14-   Vaping Use   Vaping status: Never Used  Substance and Sexual Activity   Alcohol  use: Not Currently    Comment: drink a can of beer occasionally every year    Drug use: Yes    Frequency: 2.0 times per week    Types: Marijuana   Sexual activity: Yes    Partners: Female  Other Topics Concern   Not on file  Social History Narrative   Married.    Social Drivers  of Corporate Investment Banker Strain: Not on file  Food Insecurity: No Food Insecurity (02/06/2024)   Hunger Vital Sign    Worried About Running Out of Food in the Last Year: Never true    Ran Out of Food in the Last Year: Never true  Transportation Needs: No Transportation Needs (02/06/2024)   PRAPARE - Administrator, Civil Service (Medical): No    Lack of Transportation (Non-Medical): No  Physical Activity: Not on file  Stress: Not on file  Social Connections: Unknown (02/06/2024)   Social Connection and Isolation Panel    Frequency of Communication with Friends and Family: Three times a week    Frequency of Social Gatherings with Friends and Family: Three times a week    Attends Religious Services: Patient declined    Active Member of Clubs or Organizations: No    Attends Banker Meetings: Patient declined    Marital Status: Married  Catering Manager Violence: Not At Risk (02/06/2024)   Humiliation, Afraid, Rape, and Kick questionnaire    Fear of Current or Ex-Partner: No    Emotionally Abused: No    Physically Abused: No    Sexually Abused: No    Family History:   Family History  Problem Relation Age of Onset   Stroke Other        family Hx    Bowel Disease Mother    Hernia Father    Pneumonia Daughter      ROS:  Please see the history of present illness.   All other ROS reviewed and negative.     Physical Exam/Data: Vitals:   09/05/24 0236 09/05/24 0738  BP: (!) 147/82 139/70  Pulse: 75 60  Resp: 18 15  Temp: 97.8 F (36.6 C) (!) 97.2 F (36.2 C)  TempSrc: Oral Axillary  SpO2: 95% 95%  Weight: 94.1 kg   Height: 5' 4 (1.626 m)     Intake/Output Summary (Last 24 hours) at 09/05/2024 0813 Last data filed at 09/05/2024 0600 Gross per 24 hour  Intake 34.22 ml  Output 500 ml  Net -465.78 ml      09/05/2024    2:36 AM 07/25/2024    9:03 AM 04/24/2024   10:28 AM  Last 3 Weights  Weight (lbs) 207 lb 7.3 oz 209 lb 3.2 oz 206 lb   Weight (kg) 94.1 kg 94.892 kg 93.441 kg     Body mass index is 35.61 kg/m.  General:  Well nourished, well developed, in no acute distress. Laying comfortably in the bed. Sleeping,  wakes  up to voice  HEENT: normal Neck: no JVD Vascular: Radial pulses 2+ bilaterally Cardiac:  normal S1, S2; RRR; no murmur   Lungs:  expiratory wheezing throughout. Normal WOB on room air  Abd: soft, nontender  Ext: no edema in BLE Musculoskeletal:  No deformities  Skin: warm and dry  Neuro:  CNs 2-12 intact, no focal abnormalities noted Psych:  Normal affect   EKG:  The EKG was personally reviewed and demonstrates:  NSR, nonspecific T wave abnormality  Telemetry:  Telemetry was personally reviewed and demonstrates:  NSR   Relevant CV Studies: Cardiac Studies & Procedures   ______________________________________________________________________________________________ CARDIAC CATHETERIZATION  CARDIAC CATHETERIZATION 02/06/2024  Conclusion Images from the original result were not included. Coronary and bypass graft angiography 02/06/2024: LM: Distal 60% stenosis LAD: ostial 60% stenosis, followed by proximal 100% occlusion Ramus: Prox 80% stenosis, distally small caliber vessel (Unprotected) Lcx: Mld diffuse disease (Unprotected) RCA: Prox 100% occlusion (Unprotected) LIMA-LAD: Patent. Moderate disease in mid LAD beyond graft touchdown SVG_diag: Prox stent with minimal restenosis. Mid stent with 40% restenosis, slightly progressed since 2021. Jump graft portion supplying Lcx/OM is known occluded. SVG-RCA: Known occluded, not engaged today  LVEDP 19 mmHg    Severe native vessel disease Patent LIMA-LAD, SVG-diag portion of jump graft with only moderate restenosis  Exertional angina likely due to severe native vessel disease with no good targets for percutaneous intervention. Exertional dizziness unlikely to be related to CAD.  Continue medical management, and uptitration of antianginal  therapy as tolerated.  [name], MD  Findings Coronary Findings Diagnostic  Dominance: Right  Left Main Mid LM to Ost LAD lesion is 60% stenosed. The lesion is eccentric. The lesion is calcified.  Left Anterior Descending Prox LAD lesion is 100% stenosed. Mid LAD lesion is 100% stenosed. Mid LAD to Dist LAD lesion is 30% stenosed. Dist LAD lesion is 50% stenosed.  First Diagonal Branch Vessel is large in size.  Second Diagonal Branch Vessel is small in size.  Third Diagonal Branch Vessel is small in size.  Ramus Intermedius Vessel is moderate in size. Collaterals Ramus filled by collaterals from 1st Diag.  Ramus lesion is 80% stenosed.  Left Circumflex Vessel is moderate in size. There is moderate diffuse disease throughout the vessel.  First Obtuse Marginal Branch Vessel is small in size.  Second Obtuse Marginal Branch Vessel is small in size.  Third Obtuse Marginal Branch Vessel is moderate in size.  Right Coronary Artery Vessel is moderate in size. Prox RCA lesion is 80% stenosed. Mid RCA to Dist RCA lesion is 100% stenosed. The lesion is chronically occluded with left-to-right collateral flow.  Right Posterior Descending Artery Collaterals RPDA filled by collaterals from 2nd Sept.  Sequential Saphenous Graft To 1st Diag, Ramus SVG and is large. Origin to Prox Graft lesion before 1st Diag  is 35% stenosed. The lesion was previously treated . Dist Graft to Insertion lesion before 1st Diag  is 40% stenosed. The lesion was previously treated . Prox Graft to Dist Graft lesion between 1st Diag and Ramus  is 100% stenosed. The lesion is chronically occluded.  Graft To RPDA Origin lesion is 100% stenosed. Prox Graft lesion is 100% stenosed. The lesion was previously treated . Prox Graft to Mid Graft lesion is 100% stenosed. The lesion is chronically occluded. The lesion was previously treated . Dist Graft lesion is 100% stenosed. The lesion is  chronically occluded. The lesion was previously treated .  LIMA LIMA Graft To Mid LAD LIMA and  is normal in caliber.  Intervention  No interventions have been documented.   CARDIAC CATHETERIZATION  CARDIAC CATHETERIZATION 08/19/2020  Conclusion Conclusions: 1. Severe native coronary artery disease, including 60% LMCA disease, chronic total occlusions of ostial LAD and mid RCA, and 80% proximal ramus intermedius stenosis. 2. Widely patent LIMA-LAD. 3. Patent SVG-D1 with mild to moderate in-stent restenosis in the proximal and distal graft stents. 4. Chronically occluded SVG-RCA. 5. Low normal left ventricular systolic function with mid anterior hypokinesis. 6. Mildly to moderately elevated left ventricular filling pressure.  Recommendations: 1. Continue aggressive medical therapy.  Appearance of native coronary arteries and bypasses is not significantly different compared with prior catheterization in 11/2015. 2. Escalate diuresis, as worsening HFpEF may be contributing to the patient's symptoms. 3. Aggressive secondary prevention of coronary artery disease.  Lonni Hanson, MD [facility]  Findings Coronary Findings Diagnostic  Dominance: Right  Left Main Mid LM to Ost LAD lesion is 60% stenosed. The lesion is eccentric. The lesion is calcified.  Left Anterior Descending Prox LAD lesion is 100% stenosed. Mid LAD lesion is 100% stenosed. Mid LAD to Dist LAD lesion is 30% stenosed. Dist LAD lesion is 50% stenosed.  First Diagonal Branch Vessel is large in size.  Second Diagonal Branch Vessel is small in size.  Third Diagonal Branch Vessel is small in size.  Ramus Intermedius Vessel is moderate in size. Collaterals Ramus filled by collaterals from 1st Diag.  Ramus lesion is 80% stenosed.  Left Circumflex Vessel is moderate in size. There is moderate diffuse disease throughout the vessel.  First Obtuse Marginal Branch Vessel is small in size.  Second  Obtuse Marginal Branch Vessel is small in size.  Third Obtuse Marginal Branch Vessel is moderate in size.  Right Coronary Artery Vessel is moderate in size. Prox RCA lesion is 80% stenosed. Mid RCA to Dist RCA lesion is 100% stenosed. The lesion is chronically occluded with left-to-right collateral flow.  Right Posterior Descending Artery Collaterals RPDA filled by collaterals from 2nd Sept.  Sequential Saphenous Graft To 1st Diag, Ramus SVG graft was visualized by angiography and is large. Origin to Prox Graft lesion before 1st Diag  is 35% stenosed. The lesion was previously treated. Dist Graft to Insertion lesion before 1st Diag  is 20% stenosed. The lesion was previously treated. Prox Graft to Dist Graft lesion between 1st Diag and Ramus  is 100% stenosed. The lesion is chronically occluded.  Graft To RPDA Origin lesion is 100% stenosed. Prox Graft lesion is 100% stenosed. The lesion was previously treated. Prox Graft to Mid Graft lesion is 100% stenosed. The lesion is chronically occluded. The lesion was previously treated. Dist Graft lesion is 100% stenosed. The lesion is chronically occluded. The lesion was previously treated.  LIMA LIMA Graft To Mid LAD LIMA graft was visualized by angiography and is normal in caliber.  Intervention  No interventions have been documented.   STRESS TESTS  NM MYOCAR MULTI W/SPECT W 06/17/2014  Narrative CLINICAL DATA:  58 year old male with multivessel CAD status post CABG, recent intervention due to diseased SVG to ramus intermedius an obtuse marginal system, known occluded SVG to RCA, hyperlipidemia, GERD, tobacco abuse, and cardiomyopathy. This study is requested to evaluate for the presence and extent of ischemia in the setting of progressive angina symptoms.  EXAM: MYOCARDIAL IMAGING WITH SPECT (REST AND PHARMACOLOGIC-STRESS)  GATED LEFT VENTRICULAR WALL MOTION STUDY  LEFT VENTRICULAR EJECTION  FRACTION  TECHNIQUE: Standard myocardial SPECT imaging was performed after resting intravenous injection of  10 mCi Tc-71m sestamibi. Subsequently, intravenous infusion of Lexiscan  was performed under the supervision of the Cardiology staff. At peak effect of the drug, 30 mCi Tc-61m sestamibi was injected intravenously and standard myocardial SPECT imaging was performed. Quantitative gated imaging was also performed to evaluate left ventricular wall motion, and estimate left ventricular ejection fraction.  FINDINGS: Baseline ECG shows sinus bradycardia at 51 beats per min with old inferior infarct pattern and nonspecific ST changes. Lexiscan  bolus was given in standard fashion. Heart rate increased from 50 beats per min up to 83 beats per min, and blood pressure increased from 99/70 up to 118/80. Patient did experience chest pain during infusion. Nondiagnostic accentuation in resting ST segment abnormalities was noted, no arrhythmias were noted.  Analysis of the overall perfusion data finds adequate myocardial radiotracer uptake with significant gut uptake near the inferior wall. Tomographic views were obtained using the short axis, vertical long axis, and horizontal long axis planes.  Perfusion: Small to moderate sized defect involving the apical to basal inferolateral wall, overall moderate intensity, showing partial reversibility consistent with a region of ischemia.  Wall Motion: Global hypokinesis, most prominent in the inferior wall.  Left Ventricular Ejection Fraction: 40 %  End diastolic volume 125 ml  End systolic volume 75 ml  Summed stress score 7, summed rest score 3, summed difference score 4.  IMPRESSION: 1. Inferolateral defect as outlined consistent with scarring and mild to moderate peri-infarct ischemia.  2. Global LV hypokinesis, most prominent in the inferior wall.  3. Left ventricular ejection fraction 40%  4. Intermediate-risk stress test  findings*.  *2012 Appropriate Use Criteria for Coronary Revascularization Focused Update: J Am Coll Cardiol. 2012;59(9):857-881. http://content.dementiazones.com.aspx?articleid=1201161   Electronically Signed By: Samuel  Mcdowell M.D. On: 06/17/2014 12:50   ECHOCARDIOGRAM  ECHOCARDIOGRAM LIMITED 07/02/2021  Narrative ECHOCARDIOGRAM LIMITED REPORT    Patient Name:   ROBEY MASSMANN Date of Exam: 07/02/2021 Medical Rec #:  [id number]        Height:       64.0 in Accession #:    [id number]       Weight:       202.1 lb Date of Birth:  [birth year]         BSA:          1.965 m Patient Age:    55 years         BP:           113/72 mmHg Patient Gender: M                HR:           64 bpm. Exam Location:  Inpatient  Procedure: Limited Echo, Limited Color Doppler and Cardiac Doppler  Indications:    chest pain  History:        Patient has prior history of Echocardiogram examinations, most recent 08/19/2020. Cardiomyopathy, CAD, Prior CABG; Risk Factors:Hypertension, Dyslipidemia and Diabetes.  Sonographer:    Tinnie Barefoot RDCS Referring Phys: [id number] LAYMON HERO STRADER  IMPRESSIONS   1. Overall EF preserved inferior basal hypokinesis . Left ventricular ejection fraction, by estimation, is 55%. The left ventricle has normal function. The left ventricle demonstrates regional wall motion abnormalities (see scoring diagram/findings for description). Left ventricular diastolic parameters were normal. 2. Right ventricular systolic function is normal. The right ventricular size is normal. There is normal pulmonary artery systolic pressure. 3. Left atrial size was mildly dilated. 4. Right atrial size was mildly dilated. 5. The mitral valve is  normal in structure. No evidence of mitral valve regurgitation. No evidence of mitral stenosis. 6. Sclerosis particulary the non coronary cusp no stenosis . The aortic valve is tricuspid. There is moderate calcification of the aortic  valve. Aortic valve regurgitation is not visualized. No aortic stenosis is present. 7. The inferior vena cava is normal in size with greater than 50% respiratory variability, suggesting right atrial pressure of 3 mmHg.  FINDINGS Left Ventricle: Overall EF preserved inferior basal hypokinesis. Left ventricular ejection fraction, by estimation, is 55%. The left ventricle has normal function. The left ventricle demonstrates regional wall motion abnormalities. The left ventricular internal cavity size was normal in size. There is no left ventricular hypertrophy. Left ventricular diastolic parameters were normal.  Right Ventricle: The right ventricular size is normal. No increase in right ventricular wall thickness. Right ventricular systolic function is normal. There is normal pulmonary artery systolic pressure. The tricuspid regurgitant velocity is 2.36 m/s, and with an assumed right atrial pressure of 3 mmHg, the estimated right ventricular systolic pressure is 25.3 mmHg.  Left Atrium: Left atrial size was mildly dilated.  Right Atrium: Right atrial size was mildly dilated.  Pericardium: There is no evidence of pericardial effusion.  Mitral Valve: The mitral valve is normal in structure. No evidence of mitral valve stenosis.  Tricuspid Valve: The tricuspid valve is normal in structure. Tricuspid valve regurgitation is trivial. No evidence of tricuspid stenosis.  Aortic Valve: Sclerosis particulary the non coronary cusp no stenosis. The aortic valve is tricuspid. There is moderate calcification of the aortic valve. Aortic valve regurgitation is not visualized. No aortic stenosis is present.  Pulmonic Valve: The pulmonic valve was normal in structure. Pulmonic valve regurgitation is not visualized. No evidence of pulmonic stenosis.  Aorta: The aortic root is normal in size and structure.  Venous: The inferior vena cava is normal in size with greater than 50% respiratory variability, suggesting  right atrial pressure of 3 mmHg.  IAS/Shunts: No atrial level shunt detected by color flow Doppler.  LEFT VENTRICLE PLAX 2D LVIDd:         4.80 cm Diastology LVIDs:         3.40 cm LV e' medial:    7.51 cm/s LV PW:         1.00 cm LV E/e' medial:  9.8 LV IVS:        0.90 cm LV e' lateral:   12.00 cm/s LV E/e' lateral: 6.1   IVC IVC diam: 1.90 cm  LEFT ATRIUM         Index LA diam:    3.90 cm 1.98 cm/m AORTIC VALVE LVOT Vmax:   116.00 cm/s LVOT Vmean:  75.900 cm/s LVOT VTI:    0.247 m  AORTA Ao Asc diam: 2.70 cm  MITRAL VALVE               TRICUSPID VALVE MV Area (PHT): 3.53 cm    TR Peak grad:   22.3 mmHg MV Decel Time: 215 msec    TR Vmax:        236.00 cm/s MV E velocity: 73.70 cm/s MV A velocity: 73.70 cm/s  SHUNTS MV E/A ratio:  1.00        Systemic VTI: 0.25 m  Maude Emmer MD Electronically signed by Maude Emmer MD Signature Date/Time: 07/02/2021/10:20:04 AM    Final          ______________________________________________________________________________________________      Echocardiogram (1120/2025):  1. Left ventricular ejection fraction, by estimation, is  55 to 60%. The  left ventricle has normal function. The left ventricle has no regional  wall motion abnormalities. There is mild asymmetric left ventricular  hypertrophy. Left ventricular diastolic  parameters were normal.   2. Right ventricular systolic function is low normal. The right  ventricular size is normal. Tricuspid regurgitation signal is inadequate  for assessing PA pressure.   3. The mitral valve is normal in structure. Trivial mitral valve  regurgitation. No evidence of mitral stenosis.   4. The aortic valve is tricuspid. Aortic valve regurgitation is not  visualized. Aortic valve sclerosis/calcification is present, without any  evidence of aortic stenosis.   5. The inferior vena cava is normal in size with greater than 50%  respiratory variability, suggesting right atrial  pressure of 3 mmHg.   Comparison(s): No significant change from prior study.   Laboratory Data: High Sensitivity Troponin:   Recent Labs  Lab 09/05/24 0641  TROPONINIHS 14     Chemistry Recent Labs  Lab 09/05/24 0641  NA 139  K 4.2  CL 100  CO2 28  GLUCOSE 117*  BUN 28*  CREATININE 1.58*  CALCIUM  8.4*  MG 2.0  GFRNONAA 51*  ANIONGAP 11    Recent Labs  Lab 09/05/24 0641  PROT 5.9*  ALBUMIN 3.1*  AST 19  ALT 16  ALKPHOS 66  BILITOT 0.3   Lipids No results for input(s): CHOL, TRIG, HDL, LABVLDL, LDLCALC, CHOLHDL in the last 168 hours.  Hematology Recent Labs  Lab 09/05/24 0641  WBC 6.1  RBC 3.72*  HGB 12.2*  HCT 37.0*  MCV 99.5  MCH 32.8  MCHC 33.0  RDW 15.7*  PLT 155   Thyroid No results for input(s): TSH, FREET4 in the last 168 hours.  BNPNo results for input(s): BNP, PROBNP in the last 168 hours.  DDimer No results for input(s): DDIMER in the last 168 hours.  Radiology/Studies:  No results found.   Assessment and Plan:  Chest Pain  Coronary Artery Disease  - Patient previously had CABG in 2003. Since then, has undergone multiple interventions. Most recent  cath from  01/2024 showed severe native vessel disease, known occluded SVG-RCA. LIMA-LAD was patent. SVG-diag with patent, jump graft portion supplying LCX/OM was known occluded. At that time, felt there  were no good targets for PCI. Patient was managed medically  - Now, patient presents with a few weeks of intermittent chest pain. Pain occurred when emotionally stressed. Denies chest pain on exertion. No SOB. He was diagnosed with bronchitis in early Nov. Has chest  pain when coughing   - Yesterday AM, he woke up at 4 AM with chest pain.  Relieved with SL nitroglycerin , but returned. Took total of 3 tablets before going to the ED - EKG nonischemic  - hsTn negative x2 - Chest pain somewhat atypical. Reassuring that it is not exertional. Negative trops and EKG reassuring as  well. Recent cath showed severe native vessel disease without target for PCI. Unclear if repeating cath would provide much more information or treatment options   - Echocardiogram pending. Further recommendations pending results  - Continue imdur  60 mg every AM, 30 mg every PM and carvedilol  3.125 mg BID (with current HR - probably cannot titrate BB further) - Add amlodipine 5 mg daily for anti-anginal benefit  - will also help with BP control  - Patient previously had side effects with ranexa  and would prefer to avoid  - Continue ASA 81 mg daily and plavix  75 mg daily  -  Continue statin therapy   HLD  - Ordered fasting lipid panel  - Transition from pravastatin  to crestor  40 mg daily   HTN  - Continue home imdur , crestor  - start amlodipine 5 mg daily as above   Risk Assessment/Risk Scores:   For questions or updates, please contact [facility] Please consult [email or web address] for contact info under     Signed, Rollo FABIENE Louder, PA-C  09/05/2024 8:13 AM   ATTENDING ATTESTATION  I have seen, examined and evaluated the patient this PM in consult along with Rollo Louder, PA-C.  After reviewing all the available data and chart, we discussed the patients laboratory, study & physical findings as well as symptoms in detail.  I agree with her findings, examination as well as impression recommendations as per our discussion.    Attending adjustments noted in italics.   Prolonged intermittent CP with NTG giving intermittent relief -- Negative Troponin levels & Stable Echo with No RWMA is very reassuring.  I personally reviewed cath films -- agree that not much to offer from PCI standpoint - unless SVG or LIMA were to develop severe disease -- less likely given no RWMA & Normal Troponin Levels.      Agree with recommendation to further titrate anti-anginal Rx -- Amlodipine is the next best addition with Ranexa  intolerance.   PRobably stable for d/c from Cardiology  standpoint --has OP f/w scheduled -- 09/19/2024.    Alm MICAEL Clay, MD, MS Alm Clay, M.D., M.S. Interventional Cardiologist  [facility] Pager # [phone]

## 2024-09-16 ENCOUNTER — Other Ambulatory Visit: Payer: Self-pay | Admitting: Cardiology

## 2024-09-19 ENCOUNTER — Encounter: Payer: Self-pay | Admitting: Nurse Practitioner

## 2024-09-19 ENCOUNTER — Ambulatory Visit: Attending: Nurse Practitioner | Admitting: Nurse Practitioner

## 2024-09-19 ENCOUNTER — Other Ambulatory Visit: Payer: Self-pay | Admitting: Cardiology

## 2024-09-19 VITALS — BP 104/62 | HR 62 | Ht 64.0 in | Wt 207.8 lb

## 2024-09-19 DIAGNOSIS — I255 Ischemic cardiomyopathy: Secondary | ICD-10-CM

## 2024-09-19 DIAGNOSIS — E782 Mixed hyperlipidemia: Secondary | ICD-10-CM

## 2024-09-19 DIAGNOSIS — Z79899 Other long term (current) drug therapy: Secondary | ICD-10-CM

## 2024-09-19 DIAGNOSIS — I739 Peripheral vascular disease, unspecified: Secondary | ICD-10-CM

## 2024-09-19 DIAGNOSIS — I2510 Atherosclerotic heart disease of native coronary artery without angina pectoris: Secondary | ICD-10-CM

## 2024-09-19 DIAGNOSIS — E669 Obesity, unspecified: Secondary | ICD-10-CM

## 2024-09-19 DIAGNOSIS — N1832 Chronic kidney disease, stage 3b: Secondary | ICD-10-CM | POA: Diagnosis not present

## 2024-09-19 DIAGNOSIS — I5020 Unspecified systolic (congestive) heart failure: Secondary | ICD-10-CM | POA: Diagnosis not present

## 2024-09-19 DIAGNOSIS — I10 Essential (primary) hypertension: Secondary | ICD-10-CM

## 2024-09-19 DIAGNOSIS — I25119 Atherosclerotic heart disease of native coronary artery with unspecified angina pectoris: Secondary | ICD-10-CM

## 2024-09-19 DIAGNOSIS — Z720 Tobacco use: Secondary | ICD-10-CM

## 2024-09-19 MED ORDER — AMLODIPINE BESYLATE 5 MG PO TABS: 5.0000 mg | ORAL_TABLET | Freq: Every day | ORAL | 3 refills | Status: AC

## 2024-09-19 NOTE — Progress Notes (Unsigned)
 Cardiology Office Note:    Date: 09/19/2024 ID:  [name] [name], DOB Aug 24, 1966, MRN [id number] PCP:  Orpha Yancey LABOR, MD [facility] Providers Cardiologist:  Jayson Sierras, MD    Referring MD: Orpha Yancey LABOR, MD   CC: Here for hospital follow-up  History of Present Illness:    [name] [name] is a 58 y.o. male with a PMH of CAD, s/p CABG in 2003, HFimpEF/ICM, hyperlipidemia, type 2 diabetes, hypertension, asthma, GERD, bipolar disorder, and tobacco abuse, who presents today for hospital appointment.  History of remote CABG in 2003, s/p NSTEMI in 2010 and received bare-metal stent to SVG-RCA.  PTCA/stent/asp thromb to SVG-RCA in 2011, several months later in 2011 received PTCA to SVG-RCA, known occlusion of SVG-RCA in 2013.  Had NSTEMI in 2014, status post PTCA/DES to SVG-diagonal/intermediate/OM1.  Last seen by Dr. Sierras on April 30, 2023.  Denied any worsening dyspnea on exertion, did notice symptoms noted with current heat and humidity regarding the weather at the time. Overall was doing well.   12/03/2023 - Presents today for 48-month follow-up appointment.  Doing well.  Tells me Dr. Orpha recently started him on losartan and wants to know if he should be starting this.  Also believes that he is taking lisinopril  although I do not see this on his med list today. Overall doing well from a cardiac perspective. Denies any chest pain, worsening shortness of breath, palpitations, syncope, presyncope, dizziness, orthopnea, PND, swelling or significant weight changes, acute bleeding, or claudication.  Hospital stay end of April to early May due to chest pain. Was reporting chest pain on exertion and at rest. Underwent LHC that showed no target intervention, medical management recommended. Losartan held d/t lower BP.  04/24/2024 - Today he presents for hospital follow-up. He states he has had two episodes of chest pain 7/6 and 7/7 of stinging/chest pressure that woke him up in the  middle of the night, took nitroglycerin  tablets with these eipsides that slowly were relived in 2-3 hours. No recurrences since. Compliant with his medications. Says at one time he took Ranexa  1,000 mg BID and did help but caused vertigo/ringing in ears, and so he is no longer taking this medication. Tells me he cannot tolerate Crestor . Just had labs with his PCP in early June. Weaning himself off smoking, was smoking 2 PPD and now down to 1 PPD. Denies any shortness of breath, palpitations, syncope, presyncope, dizziness, orthopnea, PND, swelling or significant weight changes, acute bleeding, or claudication.  07/25/2024 - Here for follow-up. Admits to occasional slight chest discomfort when doing heavy exertional activities and prolonged walking. Does admit to some leg pain and wearing out, with prolonged walking over the past year.  Does tell me he has thought about Ranexa  and does not want to restart this medicine as this made him dizzy drunk. Denies any shortness of breath, palpitations, syncope, presyncope, dizziness, orthopnea, PND, swelling or significant weight changes, acute bleeding, or claudication.  He is no longer smoking marijuana and weaning off his nicotine  use.  Hospitalized November 2025 with CP, had associated dyspnea. Initially evaluated at [facility] and transferred to [facility] for further evaluation. Added amlodipine  to tx plan. CP was felt to be atypical, reassuring workup. Echo showed normal LVEF, no significant valvular abnormalities.   09/19/2024 - Here for hospital follow-up. Doing well and denies any recurrent CP since being on Norvasc . Denies any chest pain, shortness of breath, palpitations, syncope, presyncope, dizziness, orthopnea, PND, swelling or significant weight changes, acute  bleeding, or claudication.  Please see the history of present illness.    All other systems reviewed and are negative.  EKGs/Labs/Other Studies Reviewed:    The following studies were  reviewed today:   EKG: EKG is not ordered today.  Echo 08/2024:  1. Left ventricular ejection fraction, by estimation, is 55 to 60%. The  left ventricle has normal function. The left ventricle has no regional  wall motion abnormalities. There is mild asymmetric left ventricular  hypertrophy. Left ventricular diastolic  parameters were normal.   2. Right ventricular systolic function is low normal. The right  ventricular size is normal. Tricuspid regurgitation signal is inadequate  for assessing PA pressure.   3. The mitral valve is normal in structure. Trivial mitral valve  regurgitation. No evidence of mitral stenosis.   4. The aortic valve is tricuspid. Aortic valve regurgitation is not  visualized. Aortic valve sclerosis/calcification is present, without any  evidence of aortic stenosis.   5. The inferior vena cava is normal in size with greater than 50%  respiratory variability, suggesting right atrial pressure of 3 mmHg.   Comparison(s): No significant change from prior study.  Arterial duplex 08/2024:  Summary:  Right: Atherosclerotic plaque throughout right lower extremity. Consistent  with at least mild disease. Not hemodynamically significant.   Left: Large atherosclerotic plaque throughout the left femoral artery with  significant narrowing at the distal artery with a increased psv of 215  cm/s. These findings are consistent with a 50-74% stenosis of the left  distal femoral artery.     See table(s) above for measurements and observations.   Suggest follow up study in 12 months.  ABI's 07/2024:  Summary:  Right: Resting right ankle-brachial index indicates noncompressible right  lower extremity arteries. The right toe-brachial index is normal.    Left: Resting left ankle-brachial index indicates moderate left lower  extremity arterial disease. The left toe-brachial index is abnormal.     *See table(s) above for measurements and observations.    Suggest  Peripheral Vascular Consult.  LHC 01/2024:  Coronary and bypass graft angiography 02/06/2024: LM: Distal 60% stenosis LAD: ostial 60% stenosis, followed by proximal 100% occlusion  [place]amus: Prox 80% stenosis, distally small caliber vessel (Unprotected) Lcx: Mld diffuse disease (Unprotected) RCA: Prox 100% occlusion (Unprotected) LIMA-LAD: Patent. Moderate disease in mid LAD beyond graft touchdown SVG_diag: Prox stent with minimal restenosis. Mid stent with 40% restenosis, slightly progressed since 2021. Jump graft portion supplying Lcx/OM is known occluded. SVG-RCA: Known occluded, not engaged today   LVEDP 19 mmHg      Severe native vessel disease Patent LIMA-LAD, SVG-diag portion of jump graft with only moderate restenosis   Exertional angina likely due to severe native vessel disease with no good targets for percutaneous intervention. Exertional dizziness unlikely to be related to CAD.   Continue medical management, and uptitration of antianginal therapy as tolerated.   [name], MD  Echo limited on 07/02/2021: 1. Overall EF preserved inferior basal hypokinesis . Left ventricular  ejection fraction, by estimation, is 55%. The left ventricle has normal  function. The left ventricle demonstrates regional wall motion  abnormalities (see scoring diagram/findings for  description). Left ventricular diastolic parameters were normal.   2. Right ventricular systolic function is normal. The right ventricular  size is normal. There is normal pulmonary artery systolic pressure.   3. Left atrial size was mildly dilated.   4. Right atrial size was mildly dilated.   5. The mitral valve is normal in  structure. No evidence of mitral valve  regurgitation. No evidence of mitral stenosis.   6. Sclerosis particulary the non coronary cusp no stenosis . The aortic  valve is tricuspid. There is moderate calcification of the aortic valve.  Aortic valve regurgitation is not visualized. No  aortic stenosis is  present.   7. The inferior vena cava is normal in size with greater than 50%  respiratory variability, suggesting right atrial pressure of 3 mmHg.  Left heart cath on 08/19/2020: Conclusions: Severe native coronary artery disease, including 60% LMCA disease, chronic total occlusions of ostial LAD and mid RCA, and 80% proximal ramus intermedius stenosis. Widely patent LIMA-LAD. Patent SVG-D1 with mild to moderate in-stent restenosis in the proximal and distal graft stents. Chronically occluded SVG-RCA. Low normal left ventricular systolic function with mid anterior hypokinesis. Mildly to moderately elevated left ventricular filling pressure.   Recommendations: Continue aggressive medical therapy.  Appearance of native coronary arteries and bypasses is not significantly different compared with prior catheterization in 11/2015. Escalate diuresis, as worsening HFpEF may be contributing to the patient's symptoms. Aggressive secondary prevention of coronary artery disease.   Echocardiogram on 08/19/2020:  1. Left ventricular ejection fraction, by estimation, is 55 to 60%. The  left ventricle has normal function. The left ventricle demonstrates  regional wall motion abnormalities (see scoring diagram/findings for  description). There is mild left ventricular   hypertrophy. Left ventricular diastolic parameters were normal.   2. Right ventricular systolic function is normal. The right ventricular  size is normal. There is normal pulmonary artery systolic pressure. The  estimated right ventricular systolic pressure is 30.7 mmHg.   3. The mitral valve is grossly normal. Trivial mitral valve  regurgitation.   4. The aortic valve is tricuspid. Aortic valve regurgitation is not  visualized. Mild aortic valve sclerosis is present, with no evidence of  aortic valve stenosis.   5. The inferior vena cava is dilated in size with >50% respiratory  variability, suggesting right  atrial pressure of 8 mmHg.   Left heart cath on 11/22/2015: Ramus lesion, 95% stenosed.   Saphenous vein bypass graft disease with total occlusion of the SVG to the distal RCA. The sequential SVG to the diagonal and ramus intermedius is disease with occlusion of the limb to the ramus intermedius. The side-to-side anastomosis on the diagonal remains patent. The stents in the ostium and distal saphenous vein graft sites are widely patent. Patent LIMA to LAD Total occlusion of the native right. The distal right coronary/PDA fills by left-to-right collaterals from the LAD. Segmental diffuse disease in the left main. Total occlusion of the LAD. High-grade obstruction in the circumflex and ramus intermedius originating at the left main. Widely patent LIMA to the LAD When compared to the prior angiographic images, no significant change has occurred. Inferior wall hypokinesis, estimated ejection fraction 45%   Recommendations:   Continue medical management. No significant change in anatomy when compared to 01/28/2015.   Myoview on 06/17/2014: IMPRESSION:  1. Inferolateral defect as outlined consistent with scarring and  mild to moderate peri-infarct ischemia.   2. Global LV hypokinesis, most prominent in the inferior wall.   3. Left ventricular ejection fraction 40%   4. Intermediate-risk stress test findings*.   Physical Exam:    VS:  BP 104/62   Pulse 62   Ht 5' 4 (1.626 m)   Wt 207 lb 12.8 oz (94.3 kg)   SpO2 98%   BMI 35.67 kg/m     Wt Readings from Last  3 Encounters:  09/19/24 207 lb 12.8 oz (94.3 kg)  09/05/24 207 lb 7.3 oz (94.1 kg)  07/25/24 209 lb 3.2 oz (94.9 kg)     GEN: Obese, 58 y.o. male in no acute distress HEENT: Normal NECK: No JVD; No carotid bruits CARDIAC: S1/S2, RRR, no murmurs, rubs, gallops; 2+ pulses RESPIRATORY:  Clear to auscultation without rales, wheezing or rhonchi MUSCULOSKELETAL:  No edema; No deformity  SKIN: Warm and dry NEUROLOGIC:  Alert and  oriented x 3 PSYCHIATRIC:  Normal affect   ASSESSMENT & PLAN:    In order of problems listed above:  CAD, s/p CABG Cardiac catheterization in 2021 revealed severe CAD, CTO of ostial LAD and mRCA, 60% LMCA disease, 80% proximal ramus intermedius stenosis, widely patent LIMA-LAD and patent SVG-D1 with mild to moderate in-stent restenosis in proximal and distal graft stents, chronically occluded SVG-RCA, mildly to moderately elevated LVEDP, low normal LV SF with mild anterior hypokinesis.  Aggressive medical therapy recommended with secondary prevention of CAD and escalation in diuresis recommended. Stable with no anginal symptoms. No indication for ischemic evaluation.  Continue current medication regimen, adding Zetia as noted below.  Care and ED precautions discussed. Heart healthy diet and regular cardiovascular exercise encouraged.   HFimpEF, Ischemic CM Stage C, NYHA class I-II symptoms.  EF 55-60% in November 2025.  Euvolemic and well compensated on exam.  Continue current medication regimen. Low sodium diet, fluid restriction <2L, and daily weights encouraged. Educated to contact our office for weight gain of 2 lbs overnight or 5 lbs in one week. Heart healthy diet and regular cardiovascular exercise encouraged.   Mixed HLD, PAD, medication management Most recent LDL 59, goal LDL < 55. Will begin Zetia 10 mg daily and repeat FLP/LFT in 2-3 months. Continue rest of medication regimen. See most recent vascular studies noted above - scheduled to see vascular surgery next month. Heart healthy diet and regular cardiovascular exercise encouraged.   HTN Blood pressure stable today.  BP well-controlled at home. Discussed to monitor BP at home at least 2 hours after medications and sitting for 5-10 minutes.  Continue current medication regimen. Heart healthy diet and regular cardiovascular exercise encouraged.   5.  Obesity Weight loss via diet and exercise encouraged. Discussed the impact being  overweight would have on cardiovascular risk.   6. Tobacco abuse  Smoking cessation discussed and encouraged today.  7. CKD stage 3b Most recent kidney function is stable. Avoid nephrotoxic agents. No medication changes at this time. Continue to follow with PCP.   Disposition: Care and ED precautions discussed. Follow-up with Dr. Debera or APP in 4-6 months or sooner if anything changes.  Medication Adjustments/Labs and Tests Ordered: Current medicines are reviewed at length with the patient today.  Concerns regarding medicines are outlined above.  Orders Placed This Encounter  Procedures   Lipid panel   Hepatic function panel   Meds ordered this encounter  Medications   amLODipine  (NORVASC ) 5 MG tablet    Sig: Take 1 tablet (5 mg total) by mouth daily.    Dispense:  90 tablet    Refill:  3   ezetimibe (ZETIA) 10 MG tablet    Sig: Take 1 tablet (10 mg total) by mouth daily.    Dispense:  30 tablet    Refill:  6    New    Patient Instructions  Medication Instructions:   Amlodipine  refilled today Begin Zetia 10mg  daily Continue all other medications.     Labwork:  FLP,  LFT - orders given today Reminder:  Nothing to eat or drink after 12 midnight prior to labs. Please do in 2-3 months  Office will contact with results via phone, letter or mychart.     Testing/Procedures:  none  Follow-Up:  4-6 months   Any Other Special Instructions Will Be Listed Below (If Applicable).   If you need a refill on your cardiac medications before your next appointment, please call your pharmacy.    Signed, Almarie Crate, NP

## 2024-09-19 NOTE — Patient Instructions (Addendum)
 Medication Instructions:   Amlodipine  refilled today Begin Zetia 10mg  daily Continue all other medications.     Labwork:  FLP, LFT - orders given today Reminder:  Nothing to eat or drink after 12 midnight prior to labs. Please do in 2-3 months  Office will contact with results via phone, letter or mychart.     Testing/Procedures:  none  Follow-Up:  4-6 months   Any Other Special Instructions Will Be Listed Below (If Applicable).   If you need a refill on your cardiac medications before your next appointment, please call your pharmacy.

## 2024-10-13 NOTE — Progress Notes (Deleted)
 "   Patient name: [name] [name] MRN: [id number] DOB: June 09, 1966 Sex: male  REASON FOR CONSULT: PAD  HPI: [name] [name] is a 59 y.o. male, with history of coronary artery disease status post CABG in 2003, diabetes, hypertension, hyperlipidemia, CKD that presents for evaluation of PAD.  Patient had ABIs on 08/07/2024 that were 1.08 on the right biphasic and 0.71 on the left dampened monophasic.  He did also get a duplex on 08/26/2024 showing no hemodynamic significant stenosis in the right lower extremity with a 50 to 74% distal left SFA stenosis.  Past Medical History:  Diagnosis Date   Asthma    Bipolar disorder (HCC)    Chronic back pain    Coronary atherosclerosis of native coronary artery    a. CABG 2003. b. NSTEMI 2010 s/p BMS to SVG-RCA c. PTCA/stent/asp thromb to SVG-RCA 01/2010, PTCA to SVG-RCA 04/2010 d. known occlusion of SVG-RCA 2013. e. NSTEMI 07/2013  s/p PTCA/DES to SVG-diagonal/intermediate/OM1.    Essential hypertension    GERD (gastroesophageal reflux disease)    Gout    Ischemic cardiomyopathy    a. EF 35-40% by cath 07/2013.   Mixed hyperlipidemia    Sinus bradycardia    Type 2 diabetes mellitus [facility])     Past Surgical History:  Procedure Laterality Date   CARDIAC CATHETERIZATION  06/19/2014   Procedure: CORONARY STENT INTERVENTION;  Surgeon: Alm LELON Clay, MD;  Location: [facility] CATH LAB;  Service: Cardiovascular;;  DES to seg SVG Diag/OM/Ramus    CARDIAC CATHETERIZATION N/A 11/22/2015   Procedure: Left Heart Cath and Coronary Angiography;  Surgeon: Victory LELON Sharps, MD;  Location: [facility] INVASIVE CV LAB;  Service: Cardiovascular;  Laterality: N/A;   CARDIAC CATHETERIZATION  08/19/2020   CHOLECYSTECTOMY     CORONARY ARTERY BYPASS GRAFT     2003, LIMA to LAD; SVG to diagonal; SVG to OM, ramus, and diagonal; SVG to RCA   LEFT HEART CATH AND CORS/GRAFTS ANGIOGRAPHY N/A 08/19/2020   Procedure: LEFT HEART CATH AND CORS/GRAFTS ANGIOGRAPHY;  Surgeon: Mady Bruckner, MD;   Location: [facility] INVASIVE CV LAB;  Service: Cardiovascular;  Laterality: N/A;   LEFT HEART CATH AND CORS/GRAFTS ANGIOGRAPHY N/A 02/06/2024   Procedure: LEFT HEART CATH AND CORS/GRAFTS ANGIOGRAPHY;  Surgeon: Elmira Newman PARAS, MD;  Location: [facility] INVASIVE CV LAB;  Service: Cardiovascular;  Laterality: N/A;   LEFT HEART CATHETERIZATION WITH CORONARY ANGIOGRAM N/A 01/28/2015   Procedure: LEFT HEART CATHETERIZATION WITH CORONARY ANGIOGRAM;  Surgeon: Alm LELON Clay, MD;  Location: [facility] CATH LAB;  Service: Cardiovascular;  Laterality: N/A;   LEFT HEART CATHETERIZATION WITH CORONARY/GRAFT ANGIOGRAM N/A 07/14/2013   Procedure: LEFT HEART CATHETERIZATION WITH EL BILE;  Surgeon: Bruckner JONETTA Cash, MD;  Location: [facility] CATH LAB;  Service: Cardiovascular;  Laterality: N/A;   LEFT HEART CATHETERIZATION WITH CORONARY/GRAFT ANGIOGRAM N/A 12/29/2013   Procedure: LEFT HEART CATHETERIZATION WITH EL BILE;  Surgeon: Alm LELON Clay, MD;  Location: [facility] CATH LAB;  Service: Cardiovascular;  Laterality: N/A;   LEFT HEART CATHETERIZATION WITH CORONARY/GRAFT ANGIOGRAM N/A 06/19/2014   Procedure: LEFT HEART CATHETERIZATION WITH EL BILE;  Surgeon: Alm LELON Clay, MD;  Location: [facility] CATH LAB;  Service: Cardiovascular;  Laterality: N/A;   Metal plate to chin     PERCUTANEOUS CORONARY STENT INTERVENTION (PCI-S)  07/14/2013   Procedure: PERCUTANEOUS CORONARY STENT INTERVENTION (PCI-S);  Surgeon: Bruckner JONETTA Cash, MD;  Location: [facility] CATH LAB;  Service: Cardiovascular;;   Pin placement left leg      Family History  Problem Relation  Age of Onset   Stroke Other        family Hx    Bowel Disease Mother    Hernia Father    Pneumonia Daughter     SOCIAL HISTORY: Social History   Socioeconomic History   Marital status: Married    Spouse name: Not on file   Number of children: Not on file   Years of education: Not on file   Highest education level: Not on file  Occupational  History   Not on file  Tobacco Use   Smoking status: Every Day    Current packs/day: 0.80    Average packs/day: 0.8 packs/day for 46.3 years (37.0 ttl pk-yrs)    Types: Cigarettes    Start date: 07/01/1978   Smokeless tobacco: Never   Tobacco comments:    trying to quit, 3/4 pack per day. Started the nicotine  patch on 01-10-14-   Vaping Use   Vaping status: Never Used  Substance and Sexual Activity   Alcohol  use: Not Currently    Comment: drink a can of beer occasionally every year    Drug use: Yes    Frequency: 2.0 times per week    Types: Marijuana   Sexual activity: Yes    Partners: Female  Other Topics Concern   Not on file  Social History Narrative   Married.    Social Drivers of Health   Tobacco Use: High Risk (09/19/2024)   Patient History    Smoking Tobacco Use: Every Day    Smokeless Tobacco Use: Never    Passive Exposure: Not on file  Financial Resource Strain: Not on file  Food Insecurity: No Food Insecurity (09/05/2024)   Epic    Worried About Programme Researcher, Broadcasting/film/video in the Last Year: Never true    Ran Out of Food in the Last Year: Never true  Transportation Needs: No Transportation Needs (09/05/2024)   Epic    Lack of Transportation (Medical): No    Lack of Transportation (Non-Medical): No  Physical Activity: Not on file  Stress: Not on file  Social Connections: Unknown (02/06/2024)   Social Connection and Isolation Panel    Frequency of Communication with Friends and Family: Three times a week    Frequency of Social Gatherings with Friends and Family: Three times a week    Attends Religious Services: Patient declined    Active Member of Clubs or Organizations: No    Attends Banker Meetings: Patient declined    Marital Status: Married  Catering Manager Violence: Not At Risk (09/05/2024)   Epic    Fear of Current or Ex-Partner: No    Emotionally Abused: No    Physically Abused: No    Sexually Abused: No  Depression (PHQ2-9): Not on file   Alcohol  Screen: Not on file  Housing: Low Risk (09/05/2024)   Epic    Unable to Pay for Housing in the Last Year: No    Number of Times Moved in the Last Year: 0    Homeless in the Last Year: No  Utilities: Not At Risk (09/05/2024)   Epic    Threatened with loss of utilities: No  Health Literacy: Not on file    Allergies[1]  Current Outpatient Medications  Medication Sig Dispense Refill   acetaminophen  (TYLENOL ) 325 MG tablet Take 2 tablets (650 mg total) by mouth every 4 (four) hours as needed for headache or mild pain.     allopurinol  (ZYLOPRIM ) 300 MG tablet Take 300 mg by mouth  daily.  0   amLODipine  (NORVASC ) 5 MG tablet Take 1 tablet (5 mg total) by mouth daily. 90 tablet 3   aspirin  EC 81 MG tablet Take 1 tablet (81 mg total) by mouth daily.     Blood Pressure Monitoring KIT 1 each by Does not apply route as directed. 1 kit 0   carvedilol  (COREG ) 3.125 MG tablet TAKE ONE TABLET BY MOUTH TWICE DAILY WITH MEALS 180 tablet 3   clopidogrel  (PLAVIX ) 75 MG tablet TAKE ONE TABLET BY MOUTH DAILY 90 tablet 2   diazepam  (VALIUM ) 10 MG tablet Take 10 mg by mouth 2 (two) times daily.     ezetimibe (ZETIA) 10 MG tablet Take 1 tablet (10 mg total) by mouth daily. 30 tablet 6   furosemide  (LASIX ) 40 MG tablet TAKE ONE TABLET BY MOUTH DAILY 90 tablet 2   isosorbide  mononitrate (IMDUR ) 30 MG 24 hr tablet Take 2 tablets (60 mg total) by mouth in the morning AND 1 tablet (30 mg total) every evening. 270 tablet 1   Magnesium  Oxide -Mg Supplement 200 MG TABS Take 1 tablet by mouth daily.     metFORMIN  (GLUCOPHAGE ) 1000 MG tablet Take 1 tablet (1,000 mg total) by mouth daily with breakfast. 30 tablet 0   nitroGLYCERIN  (NITROSTAT ) 0.4 MG SL tablet dissolve ONE tablet UNDER THE TONGUE every FIVE minutes FOR UP TO THREE doses if needed FOR CHEST pain. IF NO RELIEF AFTER 3RD DOSE, PROCEED TO ER OR CALL 911 25 tablet 5   omega-3 acid ethyl esters (LOVAZA ) 1 g capsule Take 1 capsule by mouth every  morning. & 2 capsules at night     ONETOUCH VERIO test strip      pantoprazole  (PROTONIX ) 40 MG tablet Take 40 mg by mouth daily.      potassium chloride  (KLOR-CON ) 10 MEQ tablet Take 10 mEq by mouth daily.     pravastatin  (PRAVACHOL ) 40 MG tablet Take 40 mg by mouth daily.     No current facility-administered medications for this visit.    REVIEW OF SYSTEMS:  [X]  denotes positive finding, [ ]  denotes negative finding Cardiac  Comments:  Chest pain or chest pressure: ***   Shortness of breath upon exertion:    Short of breath when lying flat:    Irregular heart rhythm:        Vascular    Pain in calf, thigh, or hip brought on by ambulation:    Pain in feet at night that wakes you up from your sleep:     Blood clot in your veins:    Leg swelling:         Pulmonary    Oxygen  at home:    Productive cough:     Wheezing:         Neurologic    Sudden weakness in arms or legs:     Sudden numbness in arms or legs:     Sudden onset of difficulty speaking or slurred speech:    Temporary loss of vision in one eye:     Problems with dizziness:         Gastrointestinal    Blood in stool:     Vomited blood:         Genitourinary    Burning when urinating:     Blood in urine:        Psychiatric    Major depression:         Hematologic    Bleeding problems:  Problems with blood clotting too easily:        Skin    Rashes or ulcers:        Constitutional    Fever or chills:      PHYSICAL EXAM: There were no vitals filed for this visit.  GENERAL: The patient is a well-nourished male, in no acute distress. The vital signs are documented above. CARDIAC: There is a regular rate and rhythm.  VASCULAR: *** PULMONARY: There is good air exchange bilaterally without wheezing or rales. ABDOMEN: Soft and non-tender with normal pitched bowel sounds.  MUSCULOSKELETAL: There are no major deformities or cyanosis. NEUROLOGIC: No focal weakness or paresthesias are detected. SKIN:  There are no ulcers or rashes noted. PSYCHIATRIC: The patient has a normal affect.  DATA:   ***  Assessment/Plan:   59 y.o. male, with history of coronary artery disease status post CABG in 2003, diabetes, hypertension, hyperlipidemia, CKD that presents for evaluation of PAD.  Patient had ABIs on 08/07/2024 that were 1.08 on the right biphasic and 0.71 on the left dampened monophasic.  He did also get a duplex on 08/26/2024 showing no hemodynamic significant stenosis in the right lower extremity with a 50 to 74% distal left SFA stenosis.   Lonni DOROTHA Gaskins, MD [facility]: [phone]        [1] No Known Allergies  "

## 2024-10-14 ENCOUNTER — Other Ambulatory Visit: Payer: Self-pay

## 2024-10-14 ENCOUNTER — Emergency Department (HOSPITAL_COMMUNITY)

## 2024-10-14 ENCOUNTER — Telehealth: Payer: Self-pay | Admitting: Nurse Practitioner

## 2024-10-14 ENCOUNTER — Emergency Department (HOSPITAL_COMMUNITY): Admission: EM | Admit: 2024-10-14 | Discharge: 2024-10-14 | Disposition: A | Discharge status: Home / Self Care

## 2024-10-14 ENCOUNTER — Encounter: Admitting: Vascular Surgery

## 2024-10-14 DIAGNOSIS — R0789 Other chest pain: Secondary | ICD-10-CM | POA: Insufficient documentation

## 2024-10-14 DIAGNOSIS — Z7984 Long term (current) use of oral hypoglycemic drugs: Secondary | ICD-10-CM | POA: Diagnosis not present

## 2024-10-14 DIAGNOSIS — R61 Generalized hyperhidrosis: Secondary | ICD-10-CM | POA: Insufficient documentation

## 2024-10-14 DIAGNOSIS — Z7902 Long term (current) use of antithrombotics/antiplatelets: Secondary | ICD-10-CM | POA: Insufficient documentation

## 2024-10-14 DIAGNOSIS — Z7982 Long term (current) use of aspirin: Secondary | ICD-10-CM | POA: Insufficient documentation

## 2024-10-14 DIAGNOSIS — R110 Nausea: Secondary | ICD-10-CM | POA: Diagnosis present

## 2024-10-14 DIAGNOSIS — K2900 Acute gastritis without bleeding: Secondary | ICD-10-CM | POA: Insufficient documentation

## 2024-10-14 DIAGNOSIS — Z79899 Other long term (current) drug therapy: Secondary | ICD-10-CM | POA: Diagnosis not present

## 2024-10-14 LAB — CBC WITH DIFFERENTIAL/PLATELET
Abs Immature Granulocytes: 0.02 K/uL (ref 0.00–0.07)
Basophils Absolute: 0 K/uL (ref 0.0–0.1)
Basophils Relative: 1 %
Eosinophils Absolute: 0.1 K/uL (ref 0.0–0.5)
Eosinophils Relative: 2 %
HCT: 38.7 % — ABNORMAL LOW (ref 39.0–52.0)
Hemoglobin: 12.6 g/dL — ABNORMAL LOW (ref 13.0–17.0)
Immature Granulocytes: 0 %
Lymphocytes Relative: 30 %
Lymphs Abs: 1.9 K/uL (ref 0.7–4.0)
MCH: 33.2 pg (ref 26.0–34.0)
MCHC: 32.6 g/dL (ref 30.0–36.0)
MCV: 101.8 fL — ABNORMAL HIGH (ref 80.0–100.0)
Monocytes Absolute: 0.5 K/uL (ref 0.1–1.0)
Monocytes Relative: 8 %
Neutro Abs: 3.8 K/uL (ref 1.7–7.7)
Neutrophils Relative %: 59 %
Platelets: 212 K/uL (ref 150–400)
RBC: 3.8 MIL/uL — ABNORMAL LOW (ref 4.22–5.81)
RDW: 15.8 % — ABNORMAL HIGH (ref 11.5–15.5)
WBC: 6.4 K/uL (ref 4.0–10.5)
nRBC: 0 % (ref 0.0–0.2)

## 2024-10-14 LAB — BASIC METABOLIC PANEL WITH GFR
Anion gap: 13 (ref 5–15)
BUN: 23 mg/dL — ABNORMAL HIGH (ref 6–20)
CO2: 26 mmol/L (ref 22–32)
Calcium: 8.7 mg/dL — ABNORMAL LOW (ref 8.9–10.3)
Chloride: 103 mmol/L (ref 98–111)
Creatinine, Ser: 1.39 mg/dL — ABNORMAL HIGH (ref 0.61–1.24)
GFR, Estimated: 59 mL/min — ABNORMAL LOW
Glucose, Bld: 109 mg/dL — ABNORMAL HIGH (ref 70–99)
Potassium: 4.2 mmol/L (ref 3.5–5.1)
Sodium: 142 mmol/L (ref 135–145)

## 2024-10-14 LAB — TROPONIN T, HIGH SENSITIVITY
Troponin T High Sensitivity: 45 ng/L — ABNORMAL HIGH (ref 0–19)
Troponin T High Sensitivity: 46 ng/L — ABNORMAL HIGH (ref 0–19)

## 2024-10-14 MED ORDER — LIDOCAINE VISCOUS HCL 2 % MT SOLN
15.0000 mL | Freq: Once | OROMUCOSAL | Status: AC
  Administered 2024-10-14: 15 mL via ORAL
  Filled 2024-10-14: qty 15

## 2024-10-14 MED ORDER — SUCRALFATE 1 GM/10ML PO SUSP
1.0000 g | Freq: Once | ORAL | Status: AC
  Administered 2024-10-14: 1 g via ORAL
  Filled 2024-10-14: qty 10

## 2024-10-14 MED ORDER — SUCRALFATE 1 G PO TABS: 1.0000 g | ORAL_TABLET | Freq: Three times a day (TID) | ORAL | 0 refills | Status: AC

## 2024-10-14 MED ORDER — ALUM & MAG HYDROXIDE-SIMETH 200-200-20 MG/5ML PO SUSP
30.0000 mL | Freq: Once | ORAL | Status: AC
  Administered 2024-10-14: 30 mL via ORAL
  Filled 2024-10-14: qty 30

## 2024-10-14 MED ORDER — PANTOPRAZOLE SODIUM 40 MG PO TBEC
40.0000 mg | DELAYED_RELEASE_TABLET | Freq: Once | ORAL | Status: AC
  Administered 2024-10-14: 40 mg via ORAL
  Filled 2024-10-14: qty 1

## 2024-10-14 NOTE — Telephone Encounter (Signed)
"  Patient currently in the ER   "

## 2024-10-14 NOTE — Discharge Instructions (Signed)
 Call your cardiologist tomorrow and make a follow up appointment with them. Take your carafate  as prescribed and stay on your prilosec. Return to the ER for any worsening of your symptoms.

## 2024-10-14 NOTE — ED Provider Notes (Signed)
 " [place] EMERGENCY DEPARTMENT AT [facility] Provider Note   CSN: [id number] Arrival date & time: 10/14/24  1429     Patient presents with: Chest Pain   [name] [name] is a 59 y.o. male.   59 year old male presents for evaluation of chest pain.  States has been going on since Friday but has not been getting any better.  States he has been using more nitro than usual.  He took aspirin  prior to arrival as well.  Also states that he called his doctor's office today and told them how much nitro he was taking and they advised him to come to the ER for workup.  He does admit to some shortness of breath, nausea and diaphoresis with the chest pain as well.  He is a poor historian.   Chest Pain Associated symptoms: fatigue, nausea and shortness of breath   Associated symptoms: no abdominal pain, no back pain, no cough, no fever, no palpitations and no vomiting        Prior to Admission medications  Medication Sig Start Date End Date Taking? Authorizing Provider  sucralfate  (CARAFATE ) 1 g tablet Take 1 tablet (1 g total) by mouth 4 (four) times daily -  with meals and at bedtime. 10/14/24 10/21/24 Yes Kaydie Petsch L, DO  acetaminophen  (TYLENOL ) 325 MG tablet Take 2 tablets (650 mg total) by mouth every 4 (four) hours as needed for headache or mild pain. 06/20/14   Kilroy, Luke K, PA-C  allopurinol  (ZYLOPRIM ) 300 MG tablet Take 300 mg by mouth daily. 11/03/15   [provider]  amLODipine  (NORVASC ) 5 MG tablet Take 1 tablet (5 mg total) by mouth daily. 09/19/24   Miriam Norris, NP  aspirin  EC 81 MG tablet Take 1 tablet (81 mg total) by mouth daily. 12/05/12   Rockland Sherwood BROCKS, PA-C  Blood Pressure Monitoring KIT 1 each by Does not apply route as directed. 09/29/14   Debera Jayson MATSU, MD  carvedilol  (COREG ) 3.125 MG tablet TAKE ONE TABLET BY MOUTH TWICE DAILY WITH MEALS 09/22/24   Debera Jayson MATSU, MD  clopidogrel  (PLAVIX ) 75 MG tablet TAKE ONE TABLET BY MOUTH DAILY  07/18/24   Debera Jayson MATSU, MD  diazepam  (VALIUM ) 10 MG tablet Take 10 mg by mouth 2 (two) times daily.    [provider]  ezetimibe (ZETIA) 10 MG tablet Take 1 tablet (10 mg total) by mouth daily. 09/19/24   Miriam Norris, NP  furosemide  (LASIX ) 40 MG tablet TAKE ONE TABLET BY MOUTH DAILY 07/18/24   Debera Jayson MATSU, MD  isosorbide  mononitrate (IMDUR ) 30 MG 24 hr tablet Take 2 tablets (60 mg total) by mouth in the morning AND 1 tablet (30 mg total) every evening. 07/25/24   Miriam Norris, NP  Magnesium  Oxide -Mg Supplement 200 MG TABS Take 1 tablet by mouth daily. 12/14/23   [provider]  metFORMIN  (GLUCOPHAGE ) 1000 MG tablet Take 1 tablet (1,000 mg total) by mouth daily with breakfast. 02/08/24   Regalado, Belkys A, MD  nitroGLYCERIN  (NITROSTAT ) 0.4 MG SL tablet dissolve ONE tablet UNDER THE TONGUE every FIVE minutes FOR UP TO THREE doses if needed FOR CHEST pain. IF NO RELIEF AFTER 3RD DOSE, PROCEED TO ER OR CALL 911 01/17/24   Debera Jayson MATSU, MD  omega-3 acid ethyl esters (LOVAZA ) 1 g capsule Take 1 capsule by mouth every morning. & 2 capsules at night 08/18/19   [provider]  [facility] VERIO test strip  10/10/17   [provider]  pantoprazole  (PROTONIX ) 40 MG tablet Take 40 mg by mouth daily.     [provider]  potassium chloride  (KLOR-CON ) 10 MEQ tablet Take 10 mEq by mouth daily. 02/04/24   [provider]  pravastatin  (PRAVACHOL ) 40 MG tablet Take 40 mg by mouth daily. 04/14/24   [provider]    Allergies: Patient has no known allergies.    Review of Systems  Constitutional:  Positive for fatigue. Negative for chills and fever.  HENT:  Negative for ear pain and sore throat.   Eyes:  Negative for pain and visual disturbance.  Respiratory:  Positive for shortness of breath. Negative for cough.   Cardiovascular:  Positive for chest pain. Negative for palpitations.  Gastrointestinal:  Positive for nausea. Negative  for abdominal pain and vomiting.  Genitourinary:  Negative for dysuria and hematuria.  Musculoskeletal:  Negative for arthralgias and back pain.  Skin:  Negative for color change and rash.  Neurological:  Negative for seizures and syncope.  All other systems reviewed and are negative.   Updated Vital Signs BP 126/68 (BP Location: Right Arm)   Pulse 71   Temp 98.1 F (36.7 C) (Oral)   Resp 20   SpO2 94%   Physical Exam Vitals and nursing note reviewed.  Constitutional:      General: He is not in acute distress.    Appearance: He is well-developed. He is not ill-appearing.  HENT:     Head: Normocephalic and atraumatic.  Eyes:     Conjunctiva/sclera: Conjunctivae normal.  Cardiovascular:     Rate and Rhythm: Normal rate and regular rhythm.     Heart sounds: No murmur heard. Pulmonary:     Effort: Pulmonary effort is normal. No respiratory distress.     Breath sounds: Normal breath sounds.  Abdominal:     Palpations: Abdomen is soft.     Tenderness: There is no abdominal tenderness.  Musculoskeletal:        General: No swelling.     Cervical back: Neck supple.  Skin:    General: Skin is warm and dry.     Capillary Refill: Capillary refill takes less than 2 seconds.  Neurological:     Mental Status: He is alert.  Psychiatric:        Mood and Affect: Mood normal.     (all labs ordered are listed, but only abnormal results are displayed) Labs Reviewed  BASIC METABOLIC PANEL WITH GFR - Abnormal; Notable for the following components:      Result Value   Glucose, Bld 109 (*)    BUN 23 (*)    Creatinine, Ser 1.39 (*)    Calcium  8.7 (*)    GFR, Estimated 59 (*)    All other components within normal limits  CBC WITH DIFFERENTIAL/PLATELET - Abnormal; Notable for the following components:   RBC 3.80 (*)    Hemoglobin 12.6 (*)    HCT 38.7 (*)    MCV 101.8 (*)    RDW 15.8 (*)    All other components within normal limits  TROPONIN T, HIGH SENSITIVITY - Abnormal; Notable  for the following components:   Troponin T High Sensitivity 45 (*)    All other components within normal limits  TROPONIN T, HIGH SENSITIVITY - Abnormal; Notable for the following components:   Troponin T High Sensitivity 46 (*)    All other components within normal limits    EKG: EKG Interpretation Date/Time:  Tuesday October 14 2024 15:29:03 EST Ventricular  Rate:  60 PR Interval:  185 QRS Duration:  80 QT Interval:  405 QTC Calculation: 405 R Axis:   74  Text Interpretation: Sinus rhythm Probable inferior infarct, old Lateral leads are also involved No significant change from prior Compared with prior EKG from 09/05/2024 Confirmed by [name] [name] ([id number]) on 10/14/2024 3:35:45 PM  Radiology: [facility] Chest 1 View Result Date: 10/14/2024 CLINICAL DATA:  Chest pain. EXAM: CHEST  1 VIEW COMPARISON:  02/05/2024. FINDINGS: The heart size and mediastinal contours are within normal limits. Prior median sternotomy and CABG. No overt pulmonary edema, focal consolidation, pleural effusion, or pneumothorax. No acute osseous abnormality. IMPRESSION: No acute cardiopulmonary findings. Electronically Signed   By: Harrietta Sherry M.D.   On: 10/14/2024 16:15     Procedures   Medications Ordered in the ED  alum & mag hydroxide-simeth (MAALOX/MYLANTA) 200-200-20 MG/5ML suspension 30 mL (30 mLs Oral Given 10/14/24 1534)    And  lidocaine  (XYLOCAINE ) 2 % viscous mouth solution 15 mL (15 mLs Oral Given 10/14/24 1534)  pantoprazole  (PROTONIX ) EC tablet 40 mg (40 mg Oral Given 10/14/24 1839)  sucralfate  (CARAFATE ) 1 GM/10ML suspension 1 g (1 g Oral Given 10/14/24 1839)                                    Medical Decision Making Cardiac monitor interpretation: Sinus rhythm, no ectopy  Patient here for chest pain.  Got better with a GI cocktail.  I do think he has some element of gastritis.  Troponins are slightly above normal but flat.  Given Carafate  and Protonix  as well.  He is feeling much better.  Advise  close follow-up with cardiology.  Does state he has root beer and sausage biscuits every day and he thinks this may be contributing to his symptoms.  I advised him to continue his aspirin  and nitro as needed and otherwise follow-up with primary care.  Advised return for any new or worsening symptoms.  Given very strict return precautions.  I did ask him if he wanted me to call and talk with cardiology here but he declined and states he will just plan to call them in the morning.  He feels comfortable being discharged home.  Problems Addressed: Acute gastritis without hemorrhage, unspecified gastritis type: acute illness or injury Atypical chest pain: acute illness or injury  Amount and/or Complexity of Data Reviewed External Data Reviewed: notes.    Details: Prior outpatient records reviewed and patient was advised to come to the ER for workup today Labs: ordered. Decision-making details documented in ED Course.    Details: Ordered and reviewed by me and troponins are elevated but flat, labs otherwise unremarkable Radiology: ordered and independent interpretation performed. Decision-making details documented in ED Course.    Details: Ordered and interpreted by me independently of radiology Chest x-ray: Shows no acute abnormality ECG/medicine tests: ordered and independent interpretation performed. Decision-making details documented in ED Course.    Details: EKG ordered and interpreted independently by me in the absence of cardiology and shows sinus rhythm, there is some abnormal ST and T wave changes, but these are more pronounced when compared to prior, but no STEMI  Risk OTC drugs. Prescription drug management. Drug therapy requiring intensive monitoring for toxicity. Diagnosis or treatment significantly limited by social determinants of health.    Final diagnoses:  Acute gastritis without hemorrhage, unspecified gastritis type  Atypical chest pain    ED  Discharge Orders           Ordered    sucralfate  (CARAFATE ) 1 g tablet  3 times daily with meals & bedtime        10/14/24 1934               [name] Duwaine CROME, DO 10/14/24 2308  "

## 2024-10-14 NOTE — Telephone Encounter (Signed)
 Spoke with patient regarding his symptoms. He reported he has chest discomfort when he lays down to the point he cannot sleep. He snores very loudly, his daughter and wife reported. He checked his BP, but didn't remember the number but was normal. He did tell me that he has went through a whole bottle of Nitroglycerin  and started another one. He was taken them every hour. Advised he is only supposed to take up to 3 doses every 5 minutes then after need to call 911 or go to ER if no relief. Advised him that he will need to go to the ER if he experience any more symptoms, but I will send to the DOD for further recommendation. Patient stated he plans to go to the ED after his wife gets off work.

## 2024-10-14 NOTE — Telephone Encounter (Signed)
 Pt c/o of Chest Pain: STAT if active (IN THIS MOMENT) CP, including tightness, pressure, jaw pain, shoulder/upper arm/back pain, SOB, nausea, and vomiting.  1. Are you having CP right now (tightness, pressure, or discomfort)?  No  2. Are you experiencing any other symptoms (ex. SOB, nausea, vomiting, sweating)?  Chronic snoring--thinks he needs a CPAP  3. How long have you been experiencing CP?  Since Friday night, 1/02. He says discomfort was unbearable and he was waking up taking the nitroglycerin  every hour  4. Is your CP continuous or coming and going?  Coming and going  5. Have you taken Nitroglycerin ?   Took an entire bottle from 1/02 to 1/05.

## 2024-10-14 NOTE — ED Triage Notes (Signed)
 Pt arrived via [facility] from home c/o CP mid with L arm radiation, pt took 324 aspirin  before EMS arrival and has taken 4-5 nitroglycerin  tabs today without relief, states pain is sharp in nature rates pain 8/10 m

## 2024-10-20 NOTE — Progress Notes (Unsigned)
 "   Patient name: [name] [name] MRN: [id number] DOB: [birth year] Sex: male  REASON FOR CONSULT: PAD  HPI: [name] [name] is a 59 y.o. male, with history of coronary artery disease status post CABG in 2003, diabetes, hypertension, hyperlipidemia, CKD that presents for evaluation of PAD.  Patient had ABIs on 08/07/2024 that were 1.08 on the right biphasic and 0.71 on the left dampened monophasic.  He did also get a duplex on 08/26/2024 showing no hemodynamic significant stenosis in the right lower extremity with a 50 to 74% distal left SFA stenosis.  Past Medical History:  Diagnosis Date   Asthma    Bipolar disorder (HCC)    Chronic back pain    Coronary atherosclerosis of native coronary artery    a. CABG 2003. b. NSTEMI 2010 s/p BMS to SVG-RCA c. PTCA/stent/asp thromb to SVG-RCA 01/2010, PTCA to SVG-RCA 04/2010 d. known occlusion of SVG-RCA 2013. e. NSTEMI 07/2013  s/p PTCA/DES to SVG-diagonal/intermediate/OM1.    Essential hypertension    GERD (gastroesophageal reflux disease)    Gout    Ischemic cardiomyopathy    a. EF 35-40% by cath 07/2013.   Mixed hyperlipidemia    Sinus bradycardia    Type 2 diabetes mellitus [facility])     Past Surgical History:  Procedure Laterality Date   CARDIAC CATHETERIZATION  06/19/2014   Procedure: CORONARY STENT INTERVENTION;  Surgeon: Alm LELON Clay, MD;  Location: [facility] CATH LAB;  Service: Cardiovascular;;  DES to seg SVG Diag/OM/Ramus    CARDIAC CATHETERIZATION N/A 11/22/2015   Procedure: Left Heart Cath and Coronary Angiography;  Surgeon: Victory LELON Sharps, MD;  Location: [facility] INVASIVE CV LAB;  Service: Cardiovascular;  Laterality: N/A;   CARDIAC CATHETERIZATION  08/19/2020   CHOLECYSTECTOMY     CORONARY ARTERY BYPASS GRAFT     2003, LIMA to LAD; SVG to diagonal; SVG to OM, ramus, and diagonal; SVG to RCA   LEFT HEART CATH AND CORS/GRAFTS ANGIOGRAPHY N/A 08/19/2020   Procedure: LEFT HEART CATH AND CORS/GRAFTS ANGIOGRAPHY;  Surgeon: Mady Bruckner, MD;   Location: [facility] INVASIVE CV LAB;  Service: Cardiovascular;  Laterality: N/A;   LEFT HEART CATH AND CORS/GRAFTS ANGIOGRAPHY N/A 02/06/2024   Procedure: LEFT HEART CATH AND CORS/GRAFTS ANGIOGRAPHY;  Surgeon: Elmira Newman PARAS, MD;  Location: [facility] INVASIVE CV LAB;  Service: Cardiovascular;  Laterality: N/A;   LEFT HEART CATHETERIZATION WITH CORONARY ANGIOGRAM N/A 01/28/2015   Procedure: LEFT HEART CATHETERIZATION WITH CORONARY ANGIOGRAM;  Surgeon: Alm LELON Clay, MD;  Location: [facility] CATH LAB;  Service: Cardiovascular;  Laterality: N/A;   LEFT HEART CATHETERIZATION WITH CORONARY/GRAFT ANGIOGRAM N/A 07/14/2013   Procedure: LEFT HEART CATHETERIZATION WITH EL BILE;  Surgeon: Bruckner JONETTA Cash, MD;  Location: [facility] CATH LAB;  Service: Cardiovascular;  Laterality: N/A;   LEFT HEART CATHETERIZATION WITH CORONARY/GRAFT ANGIOGRAM N/A 12/29/2013   Procedure: LEFT HEART CATHETERIZATION WITH EL BILE;  Surgeon: Alm LELON Clay, MD;  Location: [facility] CATH LAB;  Service: Cardiovascular;  Laterality: N/A;   LEFT HEART CATHETERIZATION WITH CORONARY/GRAFT ANGIOGRAM N/A 06/19/2014   Procedure: LEFT HEART CATHETERIZATION WITH EL BILE;  Surgeon: Alm LELON Clay, MD;  Location: [facility] CATH LAB;  Service: Cardiovascular;  Laterality: N/A;   Metal plate to chin     PERCUTANEOUS CORONARY STENT INTERVENTION (PCI-S)  07/14/2013   Procedure: PERCUTANEOUS CORONARY STENT INTERVENTION (PCI-S);  Surgeon: Bruckner JONETTA Cash, MD;  Location: [facility] CATH LAB;  Service: Cardiovascular;;   Pin placement left leg      Family History  Problem Relation  Age of Onset   Stroke Other        family Hx    Bowel Disease Mother    Hernia Father    Pneumonia Daughter     SOCIAL HISTORY: Social History   Socioeconomic History   Marital status: Married    Spouse name: Not on file   Number of children: Not on file   Years of education: Not on file   Highest education level: Not on file  Occupational  History   Not on file  Tobacco Use   Smoking status: Every Day    Current packs/day: 0.80    Average packs/day: 0.8 packs/day for 46.3 years (37.0 ttl pk-yrs)    Types: Cigarettes    Start date: 07/01/1978   Smokeless tobacco: Never   Tobacco comments:    trying to quit, 3/4 pack per day. Started the nicotine  patch on 01-10-14-   Vaping Use   Vaping status: Never Used  Substance and Sexual Activity   Alcohol  use: Not Currently    Comment: drink a can of beer occasionally every year    Drug use: Yes    Frequency: 2.0 times per week    Types: Marijuana   Sexual activity: Yes    Partners: Female  Other Topics Concern   Not on file  Social History Narrative   Married.    Social Drivers of Health   Tobacco Use: High Risk (09/19/2024)   Patient History    Smoking Tobacco Use: Every Day    Smokeless Tobacco Use: Never    Passive Exposure: Not on file  Financial Resource Strain: Not on file  Food Insecurity: No Food Insecurity (09/05/2024)   Epic    Worried About Programme Researcher, Broadcasting/film/video in the Last Year: Never true    Ran Out of Food in the Last Year: Never true  Transportation Needs: No Transportation Needs (09/05/2024)   Epic    Lack of Transportation (Medical): No    Lack of Transportation (Non-Medical): No  Physical Activity: Not on file  Stress: Not on file  Social Connections: Unknown (02/06/2024)   Social Connection and Isolation Panel    Frequency of Communication with Friends and Family: Three times a week    Frequency of Social Gatherings with Friends and Family: Three times a week    Attends Religious Services: Patient declined    Active Member of Clubs or Organizations: No    Attends Banker Meetings: Patient declined    Marital Status: Married  Catering Manager Violence: Not At Risk (09/05/2024)   Epic    Fear of Current or Ex-Partner: No    Emotionally Abused: No    Physically Abused: No    Sexually Abused: No  Depression (PHQ2-9): Not on file   Alcohol  Screen: Not on file  Housing: Low Risk (09/05/2024)   Epic    Unable to Pay for Housing in the Last Year: No    Number of Times Moved in the Last Year: 0    Homeless in the Last Year: No  Utilities: Not At Risk (09/05/2024)   Epic    Threatened with loss of utilities: No  Health Literacy: Not on file    Allergies[1]  Current Outpatient Medications  Medication Sig Dispense Refill   acetaminophen  (TYLENOL ) 325 MG tablet Take 2 tablets (650 mg total) by mouth every 4 (four) hours as needed for headache or mild pain.     allopurinol  (ZYLOPRIM ) 300 MG tablet Take 300 mg by mouth  daily.  0   amLODipine  (NORVASC ) 5 MG tablet Take 1 tablet (5 mg total) by mouth daily. 90 tablet 3   aspirin  EC 81 MG tablet Take 1 tablet (81 mg total) by mouth daily.     Blood Pressure Monitoring KIT 1 each by Does not apply route as directed. 1 kit 0   carvedilol  (COREG ) 3.125 MG tablet TAKE ONE TABLET BY MOUTH TWICE DAILY WITH MEALS 180 tablet 3   clopidogrel  (PLAVIX ) 75 MG tablet TAKE ONE TABLET BY MOUTH DAILY 90 tablet 2   diazepam  (VALIUM ) 10 MG tablet Take 10 mg by mouth 2 (two) times daily.     ezetimibe (ZETIA) 10 MG tablet Take 1 tablet (10 mg total) by mouth daily. 30 tablet 6   furosemide  (LASIX ) 40 MG tablet TAKE ONE TABLET BY MOUTH DAILY 90 tablet 2   isosorbide  mononitrate (IMDUR ) 30 MG 24 hr tablet Take 2 tablets (60 mg total) by mouth in the morning AND 1 tablet (30 mg total) every evening. 270 tablet 1   Magnesium  Oxide -Mg Supplement 200 MG TABS Take 1 tablet by mouth daily.     metFORMIN  (GLUCOPHAGE ) 1000 MG tablet Take 1 tablet (1,000 mg total) by mouth daily with breakfast. 30 tablet 0   nitroGLYCERIN  (NITROSTAT ) 0.4 MG SL tablet dissolve ONE tablet UNDER THE TONGUE every FIVE minutes FOR UP TO THREE doses if needed FOR CHEST pain. IF NO RELIEF AFTER 3RD DOSE, PROCEED TO ER OR CALL 911 25 tablet 5   omega-3 acid ethyl esters (LOVAZA ) 1 g capsule Take 1 capsule by mouth every  morning. & 2 capsules at night     ONETOUCH VERIO test strip      pantoprazole  (PROTONIX ) 40 MG tablet Take 40 mg by mouth daily.      potassium chloride  (KLOR-CON ) 10 MEQ tablet Take 10 mEq by mouth daily.     pravastatin  (PRAVACHOL ) 40 MG tablet Take 40 mg by mouth daily.     sucralfate  (CARAFATE ) 1 g tablet Take 1 tablet (1 g total) by mouth 4 (four) times daily -  with meals and at bedtime. 28 tablet 0   No current facility-administered medications for this visit.    REVIEW OF SYSTEMS:  [X]  denotes positive finding, [ ]  denotes negative finding Cardiac  Comments:  Chest pain or chest pressure: ***   Shortness of breath upon exertion:    Short of breath when lying flat:    Irregular heart rhythm:        Vascular    Pain in calf, thigh, or hip brought on by ambulation:    Pain in feet at night that wakes you up from your sleep:     Blood clot in your veins:    Leg swelling:         Pulmonary    Oxygen  at home:    Productive cough:     Wheezing:         Neurologic    Sudden weakness in arms or legs:     Sudden numbness in arms or legs:     Sudden onset of difficulty speaking or slurred speech:    Temporary loss of vision in one eye:     Problems with dizziness:         Gastrointestinal    Blood in stool:     Vomited blood:         Genitourinary    Burning when urinating:     Blood in urine:  Psychiatric    Major depression:         Hematologic    Bleeding problems:    Problems with blood clotting too easily:        Skin    Rashes or ulcers:        Constitutional    Fever or chills:      PHYSICAL EXAM: There were no vitals filed for this visit.  GENERAL: The patient is a well-nourished male, in no acute distress. The vital signs are documented above. CARDIAC: There is a regular rate and rhythm.  VASCULAR: *** PULMONARY: There is good air exchange bilaterally without wheezing or rales. ABDOMEN: Soft and non-tender with normal pitched bowel sounds.   MUSCULOSKELETAL: There are no major deformities or cyanosis. NEUROLOGIC: No focal weakness or paresthesias are detected. SKIN: There are no ulcers or rashes noted. PSYCHIATRIC: The patient has a normal affect.  DATA:    +-------+-----------+-----------+------------+------------+  ABI/TBIToday's ABIToday's TBIPrevious ABIPrevious TBI  +-------+-----------+-----------+------------+------------+  Right 1.14       0.88                                 +-------+-----------+-----------+------------+------------+  Left  0.71       0.577                                +-------+-----------+-----------+------------+------------+   Lower extremity arterial duplex: Right: Atherosclerotic plaque throughout right lower extremity. Consistent  with at least mild disease. Not hemodynamically significant.   Left: Large atherosclerotic plaque throughout the left femoral artery with  significant narrowing at the distal artery with a increased psv of 215  cm/s. These findings are consistent with a 50-74% stenosis of the left  distal femoral artery.   Assessment/Plan:   59 y.o. male, with history of coronary artery disease status post CABG in 2003, diabetes, hypertension, hyperlipidemia, CKD that presents for evaluation of PAD.  Patient had ABIs on 08/07/2024 that were 1.08 on the right biphasic and 0.71 on the left dampened monophasic.  He did also get a duplex on 08/26/2024 showing no hemodynamic significant stenosis in the right lower extremity with a 50 to 74% distal left SFA stenosis.   Lonni DOROTHA Gaskins, MD [facility]: [phone]        [1] No Known Allergies  "

## 2024-10-21 ENCOUNTER — Encounter: Payer: Self-pay | Admitting: Vascular Surgery

## 2024-10-21 ENCOUNTER — Ambulatory Visit: Admitting: Vascular Surgery

## 2024-10-21 VITALS — BP 134/80 | Ht 64.0 in | Wt 208.0 lb

## 2024-10-21 DIAGNOSIS — I739 Peripheral vascular disease, unspecified: Secondary | ICD-10-CM | POA: Diagnosis not present

## 2024-10-22 ENCOUNTER — Inpatient Hospital Stay (HOSPITAL_COMMUNITY): Admit: 2024-10-22 | Admitting: Family Medicine

## 2024-10-22 ENCOUNTER — Other Ambulatory Visit: Payer: Self-pay | Admitting: Vascular Surgery

## 2024-10-22 ENCOUNTER — Encounter (HOSPITAL_COMMUNITY): Payer: Self-pay

## 2024-10-22 DIAGNOSIS — I739 Peripheral vascular disease, unspecified: Secondary | ICD-10-CM

## 2024-10-23 ENCOUNTER — Other Ambulatory Visit: Payer: Self-pay | Admitting: *Deleted

## 2024-10-23 DIAGNOSIS — I739 Peripheral vascular disease, unspecified: Secondary | ICD-10-CM

## 2024-10-24 NOTE — Progress Notes (Signed)
 "   Division of Pharmacy Services  Medication History Completion Note  Name/DOB/Age of Patient: [name] [name] / 1966/02/06 / 59 y.o.  Location: [facility] CARD 07  Type: Hospital admission & Modality: Phone  Confirmed two patient identifiers: Yes  Confirmed patient is alert & oriented: Yes  Medication History Source (Med History Informants):  Patient Dispense History   Asked about any missing medications (such as pumps, injectable meds, TPN, OTC, etc): Yes  PTA Med List:  Prior to Admission Medications     Reviewed by Vernell FORBES Arias, CPhT on 10/24/24 at 1259    Medication Sig Last Dose Informant Taking? Status  allopurinoL  (ZYLOPRIM ) 300 mg tablet Take 300 mg by mouth daily. Past Week  Yes Active  amLODIPine  (NORVASC ) 5 mg tablet Take 5 mg by mouth daily. Past Week  Yes Active  aspirin  81 mg EC tablet Take 81 mg by mouth at bedtime. Past Week  Yes Active  carvedilol  (COREG ) 3.125 mg tablet Take 3.125 mg by mouth in the morning and 3.125 mg in the evening. Take with meals. Past Week  Yes Active  clopidogreL  (PLAVIX ) 75 mg tablet Take 75 mg by mouth daily. Past Week  Yes Active         Med Note SHELVY, RACHEL E   Fri Oct 24, 2024 12:55 PM) LF 10/15/24 #90 for 90d  diazePAM  (VALIUM ) 10 mg tablet Take 10 mg by mouth every 12 (twelve) hours as needed for anxiety. Past Week  Yes Active         Med Note SHELVY VERNELL FORBES   Fri Oct 24, 2024 12:55 PM) LF 10/15/24 #60 for 30d  ezetimibe (ZETIA) 10 mg tablet Take 10 mg by mouth daily. Past Week  Yes Active  famotidine  (PEPCID ) 20 mg tablet Take 20 mg by mouth 2 (two) times a day. Past Week  Yes Active  furosemide  (LASIX ) 40 mg tablet Take 40 mg by mouth 2 (two) times a day. Past Week  Yes Active  isosorbide  mononitrate (IMDUR ) 30 mg 24 hr tablet Take 2 tablets by moth every morning and then 1 tablet by mouth every evening. Past Week  Yes Active  magnesium  oxide (MAG-OX) 200 mg magnesium  tab tablet Take 1 tablet by mouth daily. Past Month   Yes Active  metFORMIN  (GLUCOPHAGE ) 1,000 mg tablet Take 1,000 mg by mouth daily with breakfast. Past Week  Yes Active  nitroglycerin  (NITROSTAT ) 0.4 mg SL tablet Place 0.4 mg under the tongue every 5 (five) minutes as needed for chest pain.   Yes Active         Med Note SHELVY, RACHEL E   Fri Oct 24, 2024 12:58 PM) Has on hand  omega 3-dha-epa-fish oil (OMEGA 3) 1,000 mg DR capsule Take 1 capsule by mouth daily. Past Week  Yes Active  pantoprazole  (PROTONIX ) 40 mg EC tablet Take 40 mg by mouth every morning before breakfast. Past Week  Yes Active  potassium chloride  (KLOR-CON ) 10 mEq ER tablet Take 10 mEq by mouth daily. Past Week  Yes Active  pravastatin  (PRAVACHOL ) 40 mg tablet Take 40 mg by mouth daily. Past Week  Yes Active  promethazine (PHENERGAN) 25 mg tablet Take 25 mg by mouth every 6 (six) hours as needed for nausea or vomiting. Past Month  Yes Active  ranolazine  (RANEXA ) 1,000 mg 12 hr tablet Take 1,000 mg by mouth 2 (two) times a day. Past Week  Yes Active  sucralfate  (CARAFATE ) 1 gram tablet Take 1 g by mouth 4 (four)  times a day. With meals and bedtime Past Week  Yes Active           Audit from Redirected Encounters   **Prior to Admission medications have not yet been reviewed for this encounter**     Selected Pharmacy:  [facility]  Comments: All medications and allergies were verified by patient who is a fairly good historian and last fills/doses were verified by Drfirst.    DPS enrolled for delivery. Please call [phone] with questions for patients bedded in [facility] and [phone] for all other locations.  Electronically signed by: Vernell FORBES Arias, CPhT 10/24/2024 12:59 PM  Medications reconciled by provider: No   Signature/Co-signature, if required: Vernell FORBES Arias, CPhT   Date/Time: 10/24/2024 12:59 PM  "

## 2024-10-24 NOTE — Care Plan (Signed)
 Cardiology Multidisciplinary Plan of Care Date of Conference: 10/24/2024 Daily Huddle @ 1030  Patient Name: [name] [name] [address] [place] [address] [phone] Medical Record Number: [id number]  Date of Birth: 24-Nov-1965 Sex: Male Payor Info: Payor: [employer] MEDICARE ADV / Plan: UHC MA DUAL COMPLETE RPPO-SNP / Product Type: Medicare Advantage /  Primary Care Provider: Yancey Elden Reno, MD  Predictive Model Details        12.1% (Medium)  Factor Value   Calculated 10/24/2024 12:06 13% Number of ED visits in last 90 days 0   Readmission Risk Score v2 Model 9% Number of active outpatient medication orders 20    9% Number of hospitalizations in last year 0    8% Braden score 20    7% Number of appointments in last 90 days 0     Admit Date/Time: 10/22/2024  5:31 PM Expected Discharge Date: Oct 24, 2024 Admitting Diagnosis: NSTEMI (non-ST elevated myocardial infarction)    (CMD) [I21.4] Time since last discharge: No previous discharge  Team Members Present: MD, Charge Nurse, Care Coordinator(s), Social Worker, PT/OT, Cardiology Nurse Navigator(s)  Plan for the Day/Plan for the Stay: Monitor labs Discharge   Objective:  Lab Results  Component Value Date   TROPHS 107 (HH) 10/22/2024   CHOL 111 10/23/2024   LDLCALC 55 10/23/2024   HDL 29 (L) 10/23/2024   TRIG 206 (H) 10/23/2024   HGBA1C 6.1 (H) 10/22/2024    First Filed Weight: 97.1 kg (214 lb 1.6 oz) Last Filed Weight: 94.3 kg (208 lb)  Multidisciplinary Summary: Nursing: Tobacco Use: High Risk (10/23/2024)   Patient History    Smoking Tobacco Use: Every Day    Smokeless Tobacco Use: Former    Passive Exposure: Not on file   Cardiac Rhythm: Normal sinus rhythm  Therapy Recommendations:  Physical:     Occupational:    Speech: Adult Diet- Mediterranean Recommended   Therapy/Consult Orders:  No therapy plan of the specified type found. None  Nutrition: Nutrition Assessment:   Nutrition Recommendations:     Case Management / Community Resources:     The patient and/or caregiver continues to be involved in their plan of care and there is mutual agreement on the self-management plan. The patient specific risk factors and goals from above were discussed with the patient and/or caregiver from a multidisciplinary approach.   [name] [name] October 24, 2024 12:12 PM

## 2024-10-24 NOTE — Care Plan (Signed)
" °  Problem: Chronic Conditions and Co-Morbidities Goal: Patient's chronic conditions and co-morbidity symptoms are monitored and maintained or improved Description: INTERVENTIONS: 1. Monitor and assess patient's chronic conditions and comorbid symptoms for stability, deterioration, or improvement 2. Collaborate with multidisciplinary team to address chronic and comorbid conditions and prevent exacerbation or deterioration 3. Update acute care plan with appropriate goals if chronic or comorbid symptoms are exacerbated and prevent overall improvement and discharge Outcome: Progressing   Problem: Cardiac: ACS Goal: Hemodynamic stability will improve by discharge Description: Hemodynamic stability will improve by discharge Outcome: Progressing Goal: Will show no evidence of cardiac arrhythmias by discharge Outcome: Progressing   Problem: Health Behavior: ACS Goal: MCB Ability to verbalize risk factor(s) of myocardial infarction and interventions to use to reduce the risk of myocardial infarction will improve before discharge Description: Ability to verbalize risk factor(s) of myocardial infarction and interventions to use to reduce the risk of myocardial infarction will improve before discharge Outcome: Progressing   Problem: Safety: Goal: Will remain free from falls by discharge Description: Will remain free from falls by discharge Outcome: Progressing   "

## 2024-10-27 ENCOUNTER — Telehealth: Payer: Self-pay

## 2024-10-27 NOTE — Transitions of Care (Post Inpatient/ED Visit) (Signed)
" ° °  10/27/2024  Name: [name] [name] MRN: [id number] DOB: 07-Mar-1966  Today's TOC FU Call Status: Today's TOC FU Call Status:: Unsuccessful Call (1st Attempt) Unsuccessful Call (1st Attempt) Date: 10/27/24  Attempted to reach the patient regarding the most recent Inpatient/ED visit.  Follow Up Plan: Additional outreach attempts will be made to reach the patient to complete the Transitions of Care (Post Inpatient/ED visit) call.   Rital Cavey J. Candis Kabel RN, MSN [facility], [facility] RN Care Manager Direct Dial: [phone]  Fax: [phone] Website: [email or web address]   "

## 2024-10-29 ENCOUNTER — Telehealth: Payer: Self-pay

## 2024-10-29 NOTE — Transitions of Care (Post Inpatient/ED Visit) (Signed)
" ° °  10/29/2024  Name: [name] [name] MRN: [id number] DOB: [birth year]  Today's TOC FU Call Status: Today's TOC FU Call Status:: Unsuccessful Call (2nd Attempt) Unsuccessful Call (2nd Attempt) Date: 10/29/24  Attempted to reach the patient regarding the most recent Inpatient/ED visit.  Follow Up Plan: Additional outreach attempts will be made to reach the patient to complete the Transitions of Care (Post Inpatient/ED visit) call.   [name] [name] Samaras RN, MSN [facility], [facility] RN Care Manager Direct Dial: [phone]  Fax: [phone] Website: [email or web address]   "

## 2024-10-30 NOTE — Discharge Summary (Signed)
 ------------------------------------------------------------------------------- Attestation signed by Charlie Penne Blackbird, MD at 10/30/2024  8:37 AM Mid level only.  Discussed and reviewed presentation, symptoms, course, and management with APP.  Agree with findings and plan as documented by APP.   -------------------------------------------------------------------------------  CARDIOLOGY RED SERVICE DISCHARGE SUMMARY Patient ID: [name] [name] [id number] y.o. 07-Nov-1965  Admission Date: 10/22/2024 Discharge Date: 10/24/2024   Admission Attending: Charlie Penne Blackbird, MD Discharge Attending: Charlie Penne Blackbird, Md  Admission Diagnoses: NSTEMI (non-ST elevated myocardial infarction)    (CMD)  Discharge Diagnoses: 1. NSTEMI (non-ST elevated myocardial infarction)    (CMD)   2. S/P coronary artery stent placement     Admission Condition: fair  Discharge Condition: Good  Indication for Admission: NSTEMI, Chest pain   Hospital Course: For full details, please see H&P, Progress Notes, Plan of Care notes, and Nursing Communications  In summary,  Presented with chest pressure and angina symptoms, including recurrent episodes requiring sublingual nitroglycerin , with escalation over the week prior to admission.  Significant cardiac history includes very early CAD with 4-vessel CABG at age 37 and recent left heart cath showing patent LIMA-LAD, occluded SVG-RCA and LCX/OM, and moderate disease in mid LAD and SVG-Diag.  Peripheral vascular disease with significant left femoral artery stenosis and mild right-sided disease.  Additional history: hypertension, gout, hyperlipidemia, anxiety, current tobacco use, diabetes mellitus, chronic kidney disease, COPD, GERD, asthma, and arthritis   10/22/2024: Transferred from [facility] after elevated troponin (200, 246, 86) and chest pain; EKG showed NSR without acute ischemia; CXR revealed borderline cardiomegaly; initial labs notable  for elevated troponin, low magnesium  (1.7), mild anemia, and abnormal lipid panel; heparin  infusion started; placed on telemetry; NPO after midnight for possible intervention; echo pending.  10/23/2024: Overnight course stable, chest pain resolved, no dysrhythmias; echo showed preserved EF (55-60%); labs revealed improved magnesium  (1.9), creatinine 1.39, and continued mild anemia; planned for left heart cath with grafts and continued heparin ; no acute events or complications reported. Patient underwent LHC which demonstrated obstructive disease of SVG-Diag graft. One stent placed with good result. He was then monitored overnight without incident. On day of DC, patient able to ambulate without issue.   On the day of discharge, Patient is clinically stable, no new symptoms, examination benign compared to yesterday. Labs and images reviewed. Patient is able to tolerate oral diet, ambulating well. Full discharge instructions discussed with patient who verbalized understanding. Patient instructed to return to ER if symptoms get worse and any new symptoms arise. Patient is being discharged in stable condition.  PHYSICAL EXAMINATION: BP 98/64   Pulse 71   Temp 98 F (36.7 C)   Resp 18   Ht 1.626 m (5' 4)   Wt 94.3 kg (208 lb)   SpO2 96%   BMI 35.70 kg/m  GENERAL: No apparent distress. Alert and oriented x3. HEENT: Head is normocephalic and atraumatic. Extraocular muscles are intact. Pupils are equal, round, and reactive to light and accommodation.  NECK: Supple. No carotid bruits.  LUNGS: Clear to auscultation. HEART: Normal rate, regular rhythm. No M/R/G. No lateral PMI displacement.  ABDOMEN: Soft, nontender, nondistended. Normoactive bowel sounds.   EXTREMITIES: +2 radial bilaterally. No edema, cyanosis, or clubbing.  SKIN:no rashes or lesions noted on exam    Consults: None   Diagnostic Studies: Labs Telemetry ECG LHC TTE   Treatments: Medication titration DES to Graft    Disposition: home   PATIENT INSTRUCTIONS   Discharge Medications     Modified Medications      Sig  Disp Refill Start End  magnesium  oxide 400 mg (241 mg magnesium ) Tab What changed: Another medication with the same name was removed. Continue taking this medication, and follow the directions you see here.  Take 1 tablet (400 mg total) by mouth daily.  90 tablet  3         Medications To Continue      Sig Disp Refill Start End  allopurinol  300 mg tablet Commonly known as: ZYLOPRIM   Take 300 mg by mouth daily.   0     amLODIPine  5 mg tablet Commonly known as: NORVASC   Take 5 mg by mouth daily.   0     aspirin  81 mg EC tablet  Take 81 mg by mouth at bedtime.   0     carvedilol  3.125 mg tablet Commonly known as: COREG   Take 3.125 mg by mouth in the morning and 3.125 mg in the evening. Take with meals.   0     clopidogrel  75 mg tablet Commonly known as: PLAVIX   Take 75 mg by mouth daily.   0     diazePAM  10 mg tablet Commonly known as: VALIUM   Take 10 mg by mouth every 12 (twelve) hours as needed for anxiety.   0     ezetimibe 10 mg tablet Commonly known as: ZETIA  Take 10 mg by mouth daily.   0     famotidine  20 mg tablet Commonly known as: PEPCID   Take 20 mg by mouth 2 (two) times a day.   0     furosemide  40 mg tablet Commonly known as: LASIX   Take 40 mg by mouth 2 (two) times a day.   0     isosorbide  mononitrate 30 mg 24 hr tablet Commonly known as: IMDUR   Take 2 tablets by moth every morning and then 1 tablet by mouth every evening.   0     metFORMIN  1,000 mg tablet Commonly known as: GLUCOPHAGE   Take 1,000 mg by mouth daily with breakfast.   0     nitroglycerin  0.4 mg SL tablet Commonly known as: NITROSTAT   Place 0.4 mg under the tongue every 5 (five) minutes as needed for chest pain.   0     omega 3-dha-epa-fish oil 1,000 mg DR capsule Commonly known as: OMEGA 3  Take 1 capsule by mouth daily.   0     pantoprazole  40 mg EC  tablet Commonly known as: PROTONIX   Take 40 mg by mouth every morning before breakfast.   0     potassium chloride  10 mEq ER tablet Commonly known as: KLOR-CON   Take 10 mEq by mouth daily.   0     pravastatin  40 mg tablet Commonly known as: PRAVACHOL   Take 40 mg by mouth daily.   0     ranolazine  1,000 mg 12 hr tablet Commonly known as: RANEXA   Take 1,000 mg by mouth 2 (two) times a day.   0     sucralfate  1 gram tablet Commonly known as: CARAFATE   Take 1 g by mouth 4 (four) times a day. With meals and bedtime   0         Unreviewed Medications      Sig Disp Refill Start End  promethazine 25 mg tablet Commonly known as: PHENERGAN Ask about: Should I take this medication?  Take 25 mg by mouth every 6 (six) hours as needed for nausea or vomiting.   0  Follow up Appointments Appointments which have been scheduled for you    Feb 12, 2025 2:30 PM [facility] New Patient with Edsel Leatherwood Raymer, PharmD, CPP [facility] [address] [facility] - Cardiology [facility] [address] [place]) [address] [place] [address] [phone]           Electronically signed by: Donnice Norman Portugal, PA-C 10/30/2024 7:41 AM   Time spent on discharge: 58 minutes

## 2024-11-06 ENCOUNTER — Telehealth (HOSPITAL_COMMUNITY): Payer: Self-pay

## 2024-11-06 NOTE — Telephone Encounter (Signed)
 Called patient regarding referral for cardiac rehab from [facility] with NSTEMI. Left VM for return call.

## 2024-11-13 ENCOUNTER — Ambulatory Visit: Admitting: Nurse Practitioner

## 2025-05-05 ENCOUNTER — Ambulatory Visit

## 2025-05-05 ENCOUNTER — Encounter
